# Patient Record
Sex: Male | Born: 1945
Health system: Southern US, Community
[De-identification: ages and names within clinical notes are randomized; demographics above are authoritative.]

## PROBLEM LIST (undated history)

## (undated) DIAGNOSIS — M1990 Unspecified osteoarthritis, unspecified site: Secondary | ICD-10-CM

## (undated) DIAGNOSIS — E669 Obesity, unspecified: Secondary | ICD-10-CM

## (undated) DIAGNOSIS — B54 Unspecified malaria: Secondary | ICD-10-CM

## (undated) DIAGNOSIS — N312 Flaccid neuropathic bladder, not elsewhere classified: Secondary | ICD-10-CM

## (undated) DIAGNOSIS — R531 Weakness: Secondary | ICD-10-CM

## (undated) DIAGNOSIS — G4730 Sleep apnea, unspecified: Secondary | ICD-10-CM

## (undated) DIAGNOSIS — N200 Calculus of kidney: Secondary | ICD-10-CM

## (undated) DIAGNOSIS — R4020 Unspecified coma: Secondary | ICD-10-CM

## (undated) DIAGNOSIS — W19XXXA Unspecified fall, initial encounter: Secondary | ICD-10-CM

## (undated) DIAGNOSIS — I5032 Chronic diastolic (congestive) heart failure: Secondary | ICD-10-CM

## (undated) DIAGNOSIS — R7881 Bacteremia: Secondary | ICD-10-CM

## (undated) DIAGNOSIS — M6282 Rhabdomyolysis: Secondary | ICD-10-CM

## (undated) DIAGNOSIS — N390 Urinary tract infection, site not specified: Secondary | ICD-10-CM

## (undated) DIAGNOSIS — Y92009 Unspecified place in unspecified non-institutional (private) residence as the place of occurrence of the external cause: Secondary | ICD-10-CM

## (undated) DIAGNOSIS — G959 Disease of spinal cord, unspecified: Secondary | ICD-10-CM

## (undated) DIAGNOSIS — F4310 Post-traumatic stress disorder, unspecified: Secondary | ICD-10-CM

## (undated) DIAGNOSIS — Z9359 Other cystostomy status: Secondary | ICD-10-CM

## (undated) DIAGNOSIS — N179 Acute kidney failure, unspecified: Secondary | ICD-10-CM

## (undated) DIAGNOSIS — R29898 Other symptoms and signs involving the musculoskeletal system: Secondary | ICD-10-CM

## (undated) DIAGNOSIS — R51 Headache: Secondary | ICD-10-CM

## (undated) DIAGNOSIS — C61 Malignant neoplasm of prostate: Secondary | ICD-10-CM

## (undated) DIAGNOSIS — R3981 Functional urinary incontinence: Secondary | ICD-10-CM

## (undated) HISTORY — DX: Flaccid neuropathic bladder, not elsewhere classified: N31.2

## (undated) HISTORY — DX: Chronic diastolic (congestive) heart failure: I50.32

## (undated) HISTORY — DX: Weakness: R53.1

## (undated) HISTORY — DX: Malignant neoplasm of prostate: C61

## (undated) HISTORY — PX: SUPRAPUBIC CATHETER INSERTION: SUR719

## (undated) HISTORY — DX: Acute kidney failure, unspecified: N17.9

## (undated) HISTORY — DX: Urinary tract infection, site not specified: N39.0

## (undated) HISTORY — DX: Functional urinary incontinence: R39.81

## (undated) HISTORY — DX: Rhabdomyolysis: M62.82

## (undated) HISTORY — DX: Bacteremia: R78.81

## (undated) HISTORY — DX: Other cystostomy status: Z93.59

## (undated) HISTORY — DX: Other symptoms and signs involving the musculoskeletal system: R29.898

## (undated) HISTORY — DX: Sleep apnea, unspecified: G47.30

## (undated) HISTORY — DX: Calculus of kidney: N20.0

## (undated) HISTORY — DX: Unspecified fall, initial encounter: W19.XXXA

## (undated) HISTORY — DX: Unspecified place in unspecified non-institutional (private) residence as the place of occurrence of the external cause: Y92.009

## (undated) HISTORY — DX: Obesity, unspecified: E66.9

---

## 1966-10-16 DIAGNOSIS — R4020 Unspecified coma: Secondary | ICD-10-CM

## 1966-10-16 HISTORY — DX: Unspecified coma: R40.20

## 1966-10-16 HISTORY — PX: OTHER SURGICAL HISTORY: SHX169

## 2006-10-16 HISTORY — PX: BACK SURGERY: SHX140

## 2007-05-10 ENCOUNTER — Inpatient Hospital Stay (HOSPITAL_COMMUNITY): Admission: EM | Admit: 2007-05-10 | Discharge: 2007-06-10 | Payer: Self-pay | Admitting: Emergency Medicine

## 2007-05-11 ENCOUNTER — Encounter: Admission: RE | Admit: 2007-05-11 | Discharge: 2007-05-11 | Payer: Self-pay | Admitting: Internal Medicine

## 2007-05-11 ENCOUNTER — Ambulatory Visit: Payer: Self-pay | Admitting: Internal Medicine

## 2007-05-13 ENCOUNTER — Encounter (INDEPENDENT_AMBULATORY_CARE_PROVIDER_SITE_OTHER): Payer: Self-pay | Admitting: Internal Medicine

## 2007-05-13 ENCOUNTER — Ambulatory Visit: Payer: Self-pay | Admitting: Vascular Surgery

## 2007-05-13 ENCOUNTER — Encounter: Payer: Self-pay | Admitting: Internal Medicine

## 2007-05-17 HISTORY — PX: ANTERIOR CERVICAL DECOMP/DISCECTOMY FUSION: SHX1161

## 2007-05-17 HISTORY — PX: THORACIC LAMINECTOMY: SHX96

## 2007-05-22 ENCOUNTER — Ambulatory Visit: Payer: Self-pay | Admitting: Vascular Surgery

## 2007-05-22 ENCOUNTER — Ambulatory Visit: Payer: Self-pay | Admitting: Physical Medicine & Rehabilitation

## 2007-07-08 ENCOUNTER — Ambulatory Visit (HOSPITAL_COMMUNITY): Admission: RE | Admit: 2007-07-08 | Discharge: 2007-07-08 | Payer: Self-pay | Admitting: Internal Medicine

## 2007-09-18 ENCOUNTER — Encounter: Admission: RE | Admit: 2007-09-18 | Discharge: 2007-10-16 | Payer: Self-pay | Admitting: Neurosurgery

## 2007-10-18 ENCOUNTER — Encounter: Admission: RE | Admit: 2007-10-18 | Discharge: 2007-11-14 | Payer: Self-pay | Admitting: Neurosurgery

## 2007-11-15 ENCOUNTER — Encounter: Admission: RE | Admit: 2007-11-15 | Discharge: 2007-11-26 | Payer: Self-pay | Admitting: Neurosurgery

## 2011-02-28 NOTE — Discharge Summary (Signed)
NAMECREIG, Jerry [name]               ACCOUNT NO.:  [id number]   MEDICAL RECORD NO.:  [id number]          PATIENT TYPE:  INP   LOCATION:  3030                         FACILITY:  [facility]   PHYSICIAN:  Barnetta Chapel, MDDATE OF BIRTH:  [birth year]   DATE OF ADMISSION:  05/11/2007  DATE OF DISCHARGE:                               DISCHARGE SUMMARY   The preliminary discharge summary is done on June 04, 2007.   The primary care Mariaisabel Bodiford is Dr. Abigail Miyamoto.   PRELIMINARY DISCHARGE DIAGNOSES:  1. Diabetes mellitus.  2. Morbid obesity.  3. Left great toe infection, resolved.  4. Bilateral lower extremity weakness.  5. Status post falls.  6. Herniated nucleus pulposus, spondylosis, cord compression,      myelopathy of C3-4, 4-5 and 5-6.  7. Thoracic spondylosis, stenosis, cord compression, multiple levels.   CONSULTATIONS:  Done include neurosurgery consult. The patient was seen  by Dr. Melvyn Novas. The neck surgery was eventually done by Dr.  Clydene Fake.   PROCEDURES DONE:  1. Anterior cervical decompression, diskectomy and fusion at C3-4, 4-5      and 5-6 with LifeNet allograft bone, __________  anterior cervical      plate.  2. C5-C11 decompressive laminectomy.   IMAGING STUDIES DONE INCLUDE:  1. Doppler venous ultrasound of lower extremities for limb pain. The      venous Doppler ultrasound revealed deep vein thrombosis on      posterior tibial vein, mid calf.  2. Chest x-ray done on May 12, 2007 reveals elevation of right      diaphragm.  3. X-ray of the left foot reveals fracture or degenerative bone      fragments at the lateral aspect of the base of the second proximal      phalanx.  4. CT angio of the chest was negative for pulmonary embolus. However,      it revealed right lower lobe pleural atelectasis.  5. CT scan of the thoracolumbar spine. CT scan of the thorax reveals      multilevel disk degeneration and spondylosis. Spinal stenosis was      noted most  prominently at the T9-10 on the left side due to disk      protrusion.  6. CT scan of the lumbar spine revealed significant lumbar      degenerative changes throughout the lumbar spine, most severe at L4-      5. It also showed moderate to severe spinal stenosis due to      vertebral __________  and advanced facet disease.  7. MRI of the cervical spine revealed multilevel degenerative changes,      large disk protrusion central and left sided at C3-4 with severe      spinal stenosis and compression of the cord; this degeneration and      spondylosis at C4-5, C5-6 and C6-7 with biforaminal narrowing and      mild spinal stenosis.  8. MRI of the thoracic spine revealed relatively small spinal canal in      the thoracic spine, extensive disk degeneration and spondylosis,  left-sided disk protrusion at T9-10 with some compression of the      left side of the cord.  9. MRI of the lumbar spine revealed congenital lumbar stenosis,      acquired disk disease which was present throughout the lumbar      spine, most severe at L4-5 with associated spinal stenosis. There      was also grade 1 slip of L4 on L5 and advanced facet arthropathy.   BRIEF HISTORY AND HOSPITAL COURSE:  Please refer to the H&P done on May 10, 2007. The patient is a 65 year old man with past medical history  significant for morbid obesity, diabetes mellitus, chronic neck and back  pain, bilateral lower extremity weakness and history of  brucellosis  while in [place]. The patient presented with generalized lower leg  weakness and frequent falls. On admission, he also had an infection in  the left great toe. The patient is known to have cervical and thoracic  spine problem and was advised to have surgery about four years ago.   The patient was admitted to the regular medical floor. Neurosurgical  consult was called. The patient was seen by Dr. Melvyn Novas. It was  advised that the infection of the great toe should  be controlled first,  and then patient should be worked up for surgery. Wound swab of the left  great toe infection grew Morganella morgagni which was sensitive to  Cipro. The patient was managed with IV Cipro. Wound care consult was  also called. The left great toe infection has healed. MRI, CT scan and x-  rays of the cervical, thoracic and lumbar spine were done (please see  above results). The patient eventually had decompressive surgery and  diskectomy on May 21, 2007. Post-procedure, the patient was admitted  to the neuro ICU. The patient developed dysphagia and had to be fed via  panda tube. The patient is still getting tube feeds via the panda tube  at the time of this dictation. He is going to have another speech  evaluation done today. If the patient passes speech evaluation today,  ADA diet will be commenced, and the plan is for the patient to be  discharged back to skilled nursing facility. PT/OT has followed the  patient throughout his stay in the hospital. Official rehabilitation  consult was called. The patient was felt not to be a rehabilitation  candidate for now. The plan is to eventually discharge the patient to  skilled nursing facility.   Discharge medications, discharge plans and any other significant finding  during this hospitalization will be dictated by the discharging  physician.      Barnetta Chapel, MD  Electronically Signed     SIO/MEDQ  D:  06/04/2007  T:  06/04/2007  Job:  [id number]   cc:   Chales Salmon. Abigail Miyamoto, M.D.  Clydene Fake, M.D.

## 2011-02-28 NOTE — Discharge Summary (Signed)
Jerry [name], Jerry [name]               ACCOUNT NO.:  [id number]   MEDICAL RECORD NO.:  [id number]            PATIENT TYPE:   LOCATION:                                 FACILITY:   PHYSICIAN:  Michelene Gardener, MD         DATE OF BIRTH:   DATE OF ADMISSION:  DATE OF DISCHARGE:                               DISCHARGE SUMMARY   ADDENDUM   PRIMARY CARE PHYSICIAN:  Dr. Henrine Screws.   DISCHARGE DIAGNOSES:  1. Herniated nucleus pulposus with spondylosis, cord compression and      myelopathy of C3-4, C5, and C6.  2. Thoracic spondylosis with spinal stenosis and cord compression.  3. Status post laminectomy.  4. Diabetes mellitus.  5. Morbid obesity.  6. Left great toe infection.  7. Bilateral lower extremity weakness.  8. Falls.   DISCHARGE MEDICATIONS:  1. Aspirin 81 mg p.o. once daily.  2. Colace 100 mg p.o. twice daily as needed.  3. Multivitamin 1 tab p.o. once daily.  4. Ambien 10 mg p.o. at bedtime as needed.  5. Symbicort 1 tab p.o. at bedtime as needed.  6. Vicodin 1-2 tabs p.o. q.4 hours as needed.   HOSPITAL COURSE:  The course of hospitalization up to August 19 was  dictated before.  Following that time, the patient has been very stable.  At that time initially, the patient was NPO because of her dysphagia.  On August 20, the patient underwent a swallow evaluation and had  modified swallow study.  He did fine.  The patient was started on  dysphagia diet and honey-thick diet.  He has been improving since that  time.  I recommend him to continue on honey-thick liquids by teaspoon,  and to be on  dysphagia 2 diet.  This can be reevaluated, and his diet  can be advanced as tolerated.  The patient has been also followed by  physical therapy, and he improved a lot.  He will be  going home on the above-mentioned medications, and to continue physical  therapy.  Otherwise, other medical problems remain stable.   Total assessment time is 40 minutes.      Michelene Gardener, MD  Electronically Signed     NAE/MEDQ  D:  06/10/2007  T:  06/10/2007  Job:  [id number]   cc:   Chales Salmon. Abigail Miyamoto, M.D.

## 2011-02-28 NOTE — Op Note (Signed)
NAMEDOAK, MAH               ACCOUNT NO.:  [id number]   MEDICAL RECORD NO.:  [id number]          PATIENT TYPE:  INP   LOCATION:  3109                         FACILITY:  [facility]   PHYSICIAN:  Clydene Fake, M.D.  DATE OF BIRTH:  1946-06-18   DATE OF PROCEDURE:  05/21/2007  DATE OF DISCHARGE:                               OPERATIVE REPORT   DIAGNOSIS:  1. Herniated nucleus pulposus, spondylosis, cord compression,      myelopathy C3-4, 4-5 and 5-6.  2. Thoracic spondylosis, stenosis, cord compression multiple levels.   PROCEDURE:  1. Anterior cervical decompression, diskectomy and fusion at C3-4, 4-5      and 5-6 with Lifenet allograft bone, Trestle anterior cervical      plate.  2. Separate procedures same anesthesia, T5-T11 decompressive      laminectomy.   SURGEON:  Clydene Fake, M.D.   Assistant for the anterior cervical fusion was Dr. Franky Macho, the thoracic  laminectomy Dr. Jeral Fruit.   ANESTHESIA:  General endotracheal tube anesthesia.   BLOOD LOSS:  1800 mL.  Blood given 3 units packed red cells.   DRAINS:  None.   COMPLICATIONS:  None.   REASON FOR PROCEDURE:  The patient is a 65 year old gentleman who has  had progressive, myelopathy found to have severe cervical stenosis, cord  compression and thoracic problems even has some lumbar issues.  He has  DVT with leg swelling, infection, infected foot  as his medical issues.  He was stabilized.  The patient was brought in for decompression of both  cervical and thoracic spine in two procedures under the same anesthesia.   PROCEDURE IN DETAIL:  The patient was brought to operating room, general  anesthesia was induced and the patient was prepped, draped in sterile  fashion and the patient was placed in 10 pounds halter traction and then  neck again prepped and draped in sterile fashion.  Site of incision in  the neck was injected with 10 mL of 1% lidocaine with epinephrine.  Incision was then made from the midline  to the anterior border of the  sternocleidomastoid muscle on the left side of neck, incision taken down  to the platysma and hemostasis obtained Bovie cauterization.  The  platysma was incised and blunt dissection taken to the anterior cervical  fascia, the anterior cervical spine and markers placed in two  interspaces 3-4 and 4-5 spaces.  X-rays obtained confirming our  positioning at 3-4 and almost could see that the needle at 4-5.  Disk  spaces were incised with 15 blade partial diskectomy performed as the  needles removed and the longus coli muscles were reflected laterally  using the Bovie from C3-C6.  Self-retaining retractor were placed so we  could see the 3-4 and 4-5 interspaces.  Distraction pins placed in the  C3 and C5. The interspace was slightly distracted, diskectomy continued  with pituitary rongeurs and curettes, Kerrison punches were used to  continue to remove disk out laterally and remove the anterior  osteophytes.  Microscope was brought in for microdissection.  We used  curettes to continue with diskectomy and  1 and 2-mm Kerrison punches  were used to remove posterior ligament, disk and posterior osteophytes  to decompress the center canal and bilateral foraminotomies performed.  This was first done at 3-4, the tightest level and that at 4-5.  When we  were finished we had good decompression in the central canal and  foramen.  We used high-speed drill to remove cartilaginous endplate at  both levels, measured height of disk space to be 5 mm at the 3-4 level,  6 mm at the 4-5 level and the corresponding Lifenet allograft bones were  tapped in place, countersunk a couple millimeters.  Before that we had  good hemostasis with Gelfoam and thrombin.  This was then irrigated out.  We irrigated with antibiotic solution and did have good hemostasis  before tapping the bone plugs in place.  Distraction pin was removed  from C3 and placed in the C6, retractors were moved down  so we could see  the 5-6 level.  The disk space was incised with 15 blade and diskectomy  was done with pituitary rongeurs and curettes and anterior osteophytes  removed with Kerrison punches.  We distracted the interspace and  diskectomy continued using the microscope for microdissection, curettes  and pituitary rongeurs and then 1 and 2 mm Kerrison punches were used to  remove posterior disk osteophytes and ligament to decompress the central  canal.  Bilateral foraminotomies were also performed.  When we were  finished, we had good decompression.  Height of the disk space was  measured to be 6 mm and 6 mm Lifenet allograft bone was tapped into  place, countersunk a few millimeters.  Distraction pins removed.  Hemostasis obtained with Gelfoam and thrombin at each of these holes  from the distraction pins.  Weight was removed from the traction.  We  had good position of the bone plugs at 3-4, 4-5, 5-6.  Trestle anterior  cervical plate was placed over the anterior cervical spine, two screws  placed at C3, two screws in C4 and C5 and the C6.  These were tightened  down.  Lateral x-rays obtained showing good position plates, screws and  bone plugs at 3-4, 4-5 level, could not see the 5-6 due to body habitus.  Intraop looked good, we irrigated antibiotic solution.  We had good  hemostasis.  Retractors removed.  We still had good hemostasis and the  platysma was closed with 3-0 Vicryl interrupted sutures.  Subcutaneous  tissue closed with same.  Skin closed benzoin, Steri-Strips.  Dressing  was placed.  The patient was placed in a soft cervical collar.  The  patient was placed back into stretcher and then placed into a prone  position on the operating room table for the second part of the case  with the thoracic laminectomy.  The patient had all pressure points  padded and prepped and draped in a sterile fashion.  Incision was then  made over the thoracic spine.  Incision taken down to the  fascia.  Hemostasis obtained Bovie cauterization.  The fascia was incised with  Bovie and subperiosteal dissection was done over the spinous process and  lamina bilaterally and retractors were placed.  We placed markers in  interspaces and took an AP x-ray to show our positioning so we dissected  from T5 through the top of T10 so we needed to extend our incision  caudally a bit which we did and continued dissection down subperiosteal  dissection over the spinous processes and lamina so that  most of T11 was  exposed.  We placed markers in the interspace and took second x-ray and  did confirm our positioning.  Laminectomy was then performed and started  with the [place] rongeurs, removing the bottom of the T5 spinous process  and lamina of all the intervening levels all the way to the top of the  T11 spinous process lamina.  Again used [employer] and then  Kerrison punches to do the decompressive laminectomy, high-speed drill  was also used to assist with the laminectomy.  When we were finished we  had good central decompression posteriorly from bottom of C5 to top of  C11 inclusive.  We used Kerrison high-speed drill to smooth out our  lateral margins and used Kerrison punches to extend our decompression a  little bit laterally so we would have good posterior decompression.  Hemostasis obtained with Gelfoam and thrombin in the epidural space.  This was then irrigated out.  There is significant paraspinous muscle  oozing during the exposure of the lamina.  This was controlled with  bipolar cauterization Bovie and Gelfoam and thrombin.  When we were  finished, we irrigated with antibiotic solution.  We did have good  hemostasis.  Retractors were removed and the fascia closed with 0-0  Vicryl interrupted suture.  Subcutaneous tissue closed with and 2-0  Vicryl interrupted sutures.  Skin closed with staples.  Dressing was  placed.  The patient was placed back in the supine position.  We  again  examined his neck, there was no hematoma, incision looked good.  He was  awoken from anesthesia, extubated and transferred recovery room in  stable but fair condition.           ______________________________  Clydene Fake, M.D.    JRH/MEDQ  D:  05/21/2007  T:  05/22/2007  Job:  [id number]

## 2011-02-28 NOTE — H&P (Signed)
[name], [name]               ACCOUNT NO.:  [id number]   MEDICAL RECORD NO.:  [id number]          PATIENT TYPE:  INP   LOCATION:  1605                         FACILITY:  [facility]   PHYSICIAN:  Jerry [name], MDDATE OF BIRTH:  [birth year]   DATE OF ADMISSION:  05/10/2007  DATE OF DISCHARGE:  05/11/2007                              HISTORY & PHYSICAL   CHIEF COMPLAINT:  Lower extremity weakness and frequent falls.   HISTORY OF PRESENT ILLNESS:  The patient is a 65 year old gentleman with  a long history of lower extremity weakness.  He states that in 2004 he  underwent extensive neurology evaluation and was referred to Dr. Coletta [name] for neurosurgical treatment.  He states that emergent surgery  was recommended at that time, apparently lumbar decompression surgery.  He was ambulatory at that time and declined surgical intervention.  He  is unclear of the details of his medical history.  He has a history of  frequent falls and states that he has fallen down stairs on 4 occasions.  He uses a cane and his 2-story condominium is equipped with rales and he  also uses crutches.  He states at 11:00 p.m. last night he fell and was  unable to get up and ambulate and EMS was called at 5:38 a.m.  They  assisted him back to bed.  He again fell trying to ambulate to the  bathroom and had urinary incontinence.  Because of his progressive  inability to ambulate, he presented to the emergency department via EMS  for evaluation.   He states that he is alternating diarrhea and constipation, but has  noted no real changes in his bowel habits.  He denies any incontinence  except when he falls and is unable to get to the restroom.  He denies  any significant back pain.  He states that he has motor weakness but no  sensory loss.  The patient is now admitted for further evaluation and  treatment of his lower extremity weakness.   PAST MEDICAL HISTORY:  He states he was last hospitalized at  [facility] in May of 1990 for trauma and had a brief overnight admission.  He has not seen his primary care Jerry [name] in approximately 2 years.  He  takes no chronic medications he, although he does take multiple  vitamins.  As mentioned, he has been evaluated by Neurology and  Neurosurgery for his lower extremity weakness.  He is a [place] veteran  and he had apparently multiple shrapnel and some head trauma in the  service.  He states that while on active duty, he had malaria and  brucellosis.  His years in the service complicated by post traumatic  stress disorder.   SOCIAL HISTORY:  He is divorced.  Lives in a two-story condominium.  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  He has 1 son who lives in  [place].  He is a nondrinker, nonsmoker.  Presently, he is active as a  day trader.  REVIEW OF SYSTEM:  Otherwise noncontributory.  He states that he has  seen Dr. Amil [name] as well as Dr. Elsie [name] in the past and has had periodic  cardiology evaluations since age 44.  His last stress test was in 2004.  This was done in part due to his dizziness, frequent falls, and rule out  tachyarrhythmias.  Holter or event monitoring was also done at that  time.   FAMILY HISTORY:  Father died at 72.  He was status post CABG apparently  at age 65.  He also had senile dementia of the Alzheimer's type.  Mother  died at 37 with a history of coronary disease and also dementia.  One  sister as well.   PHYSICAL EXAMINATION:  GENERAL:  A morbidly obese male who is alert,  cooperative, and in no acute distress  VITAL SIGNS:  Temperature of 97.9, pulse rate 81, respiratory rate 16,  O2 saturation 95%.  SKIN:  Warm and dry without rash.  HEENT:  No signs of trauma.  Pupil responses were normal.  Extraocular  muscles were full.  ENT negative.  NECK:  No bruits.  CHEST:  Clear anterolaterally.  CARDIOVASCULAR:  Normal  S1-S2.  No murmurs.  ABDOMEN:  Markedly obese.  There is a suggestion of hepatomegaly but not  definite.  There is no tenderness.  Bowel sounds normal.  EXTERNAL GENITALIA:  Unremarkable.  RECTAL EXAM:  Done with considerable difficulty due to his size.  He did  have lax rectal tone.  EXTREMITIES:  Bilateral lower extremity edema, the right greater than  the left.  He had some small excoriations over the dorsal aspect of the  toes.  Dorsalis pedis pulses were full.  Posterior tibial pulses were  not easily palpable.  NEUROLOGIC EXAM:  Normal cranial nerve examination.  Power and strength  was symmetrical and fairly normal.  He had bilateral lower extremity  weakness.  He is unable to raise either leg off examining table.  He was  able to flex at the hip slightly.  Reflexes were diminished.  Plantar  responses were withdrawal.   IMPRESSION:  Lower extremity weakness, chronic with acute exacerbation.   DISPOSITION:  We will admit the patient to the hospital.  We will obtain  a lumbar MRI and further evaluated by Neurology and possible  Neurosurgery.      Jerry Savers, MD  Electronically Signed     PFK/MEDQ  D:  05/10/2007  T:  05/12/2007  Job:  [id number]

## 2011-02-28 NOTE — Consult Note (Signed)
Jerry [name], Jerry [name] NO.:  [id number]   MEDICAL RECORD NO.:  [id number]          PATIENT TYPE:  INP   LOCATION:  3027                         FACILITY:  [facility]   PHYSICIAN:  Melvyn Novas, M.D.  DATE OF BIRTH:  1946-06-19   DATE OF CONSULTATION:  DATE OF DISCHARGE:                                 CONSULTATION   This patient is seen at [facility] on Saturday, the 26th of  July in a consult requested by Dr. Amador Cunas.  The patient was admitted to the hospitalist service through Dr.  Amador Cunas but transferred to the [facility]  He was  born on 26th of June 03, 1946 and is with a morbidly obese Caucasian,  single male, living in the [place] area.   CHIEF COMPLAINT:  At the time of admission, is generalized weakness and  frequent falls.  By now, the patient is not able to lift his lower  extremities, and he states that he has been falling at least 5 or 6  times within the last week.  Twice, the EMS had to come to his house.   HISTORY OF PRESENT ILLNESS:  The patient states that he has increasing  weakness but has been evaluated for weakness at [facility] in the past, about 4 years ago upon request of Dr. Abigail Miyamoto,  his primary care doctor.  He also remembers that he was referred to Dr.  Coletta Memos for a surgical opinion and was told that he needs cervical  and thoracic spinal surgery, to prevent ambulatory dysfunction.  At the  time, the patient did not choose to follow up with the surgical plan and  did not follow up with neurology either.   The patient is now unable to ambulate within his own house.  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  He is 71 years of old.  He does not know of any past medical history, denies hypertension or  diabetes and states he takes no medications, except occasionally  something for acid reflux over the counter and occasional baby aspirin.  He has also  spent a significant time on the floor in his house after the  fall, not being able to reach the phone to call the ambulance or a  neighbor that has frequently helped him.  He states that, when he fell,  he had urinary incontinence, was unable to ambulate at all to lift  himself up, even with leaning on furniture and that he was therefore  presenting at 11:00 p.m. on Friday to the emergency room.  He also had  recently some problems with diarrhea alternating with constipation.  There was no bowel incontinence.  He also does not have back pain.  There is no sensory loss but only lower motor extremity weakness.   PAST MEDICAL HISTORY:  Last hospitalization was in 1990 for trauma, and  it took place at [facility].  He has  not followed up with Dr. Abigail Miyamoto in approximately 2 years.   MEDICATIONS:  He takes no chronic medications.  ALLERGIES:  HE HAS NO KNOWN ALLERGIES.   SOCIAL HISTORY:  As to his social history, he is a [place] veteran,  single, nonsmoker, nondrinker and has been treated for head trauma.  During his service time, he was also contracting malaria and brucellosis  in [place], and he has been diagnosed with post-traumatic stress  disorder.  He has a son from a previous marriage.  Apparently, he is  divorced and has stated that he has no live-in companion of any kind.   REVIEW OF SYSTEMS:  Is otherwise noncontributory.  The patient had again  been seen by neurology and neurosurgery approximately 2004, 2005.   FAMILY HISTORY:  Father died of a sudden cardiac arrest, and apparently  had some type of dementia.  Mother died of coronary disease and also  dementia, but was only in her early 16s.   PHYSICAL EXAMINATION:  GENERAL:  This is a morbidly obese Caucasian  male, alert, cooperative, talkative, in no acute distress but very  anxious.  VITAL SIGNS:  Stable.  Temperature was 98 degrees Fahrenheit, pulse was  81 beats per minute,  respiratory rate 16 per minute, O2 saturation on  room air was 95%.  SKIN:  Warm and dry without a rash.  EXTREMITIES:  The patient has, however, decreased popliteal and dorsal  pedis pulses and had a bluish big toe on the left foot that appears to  me possibly gangrenous.  There are also multiple blisters all over his  feet that he states came from carpet burn when he tries to walk to the  phone.  HEENT:  Atraumatic normocephalic head.  Pupils responses were normal.  Extraocular muscles and rotation were full.  There was a Mallampati  grade 3.  The patient has no sensory loss and no facial droop.  There is  no carotid bruit.  LUNGS:  Clear to auscultation.  CARDIAC:  He can be barely sit up with assistance, to allow me to  auscultate him, by anterior auscultation no lung rhonci, no rales,  murmur.  ABDOMEN:  The patient is morbidly obese.  Bowel sounds were normal..   He has a edema bilaterally.  Again, there is the with left-sided  possible gangrenous big toe. Left foot dorsalis pedis pulses were not  felt.  NEUROLOGIC:  Mental status:  The patient has a peculiar affect.  He is  friendly, cooperative, significantly anxious and repetitively asking  questions that he re-phrases.  I am sure there is no memory problems but  a problem of attention, and he appears to be pleasant and often precedes  with repetitive questions with a sentence, I know I have asked this  before, but I need to ask you again.  Again, cranial nerve examination  as above was normal.  Motor examination:  The patient cannot lift either leg anti-gravity.   He does have movement but can not  not maintain antigravity strenth.  He  can move his arms anti-gravity.  He seems to have a slight possible  pronator drift on the left. He had left-sided left foot strength, than  on the right, but no finger-nose dysmetria, ataxia.   Deep tendon reflexes in the lower extremities were but diminished, and  at the Achilles tendon  level not obtainable at all.  Plantar responses  were normal, but I needed to apply strong repetitive Babinski  stimulation to obtain them.   A gait examination was deferred.      Melvyn Novas, M.D.  Electronically Signed  CD/MEDQ  D:  05/13/2007  T:  05/13/2007  Job:  [id number]   cc:   Chales Salmon. Abigail Miyamoto, M.D.  Fax: [phone]   Coletta Memos, M.D.  Fax: [phone]   Clydene Fake, M.D.  Fax: [phone]

## 2011-07-28 LAB — CBC
HCT: 34.8 — ABNORMAL LOW
Hemoglobin: 11.6 — ABNORMAL LOW
MCV: 89.6
RDW: 14.4 — ABNORMAL HIGH
WBC: 7.8

## 2011-07-31 LAB — BASIC METABOLIC PANEL
BUN: 10
BUN: 5 — ABNORMAL LOW
BUN: 6
BUN: 8
CO2: 30
CO2: 32
CO2: 34 — ABNORMAL HIGH
CO2: 26
CO2: 26
Calcium: 8.3 — ABNORMAL LOW
Calcium: 8.4
Calcium: 8.6
Calcium: 8.7
Calcium: 9
Calcium: 9
Calcium: 9.6
Chloride: 102
Chloride: 104
Chloride: 95 — ABNORMAL LOW
Chloride: 97
Creatinine, Ser: 0.63
Creatinine, Ser: 0.7
Creatinine, Ser: 0.77
Creatinine, Ser: 0.8
Creatinine, Ser: 0.93
Creatinine, Ser: 1.76 — ABNORMAL HIGH
GFR calc Af Amer: >60
GFR calc Af Amer: >60
GFR calc Af Amer: >60
GFR calc Af Amer: >60
GFR calc Af Amer: >60
GFR calc Af Amer: >60
GFR calc Af Amer: 48 — ABNORMAL LOW
GFR calc Af Amer: >60
GFR calc non Af Amer: >60
GFR calc non Af Amer: >60
GFR calc non Af Amer: >60
GFR calc non Af Amer: >60
GFR calc non Af Amer: >60
GFR calc non Af Amer: 39 — ABNORMAL LOW
GFR calc non Af Amer: >60
Glucose, Bld: 100 — ABNORMAL HIGH
Glucose, Bld: 107 — ABNORMAL HIGH
Glucose, Bld: 109 — ABNORMAL HIGH
Glucose, Bld: 112 — ABNORMAL HIGH
Glucose, Bld: 94
Potassium: 3.5
Potassium: 3.7
Potassium: 4.1
Potassium: 4.1
Sodium: 136
Sodium: 138
Sodium: 140
Sodium: 142
Sodium: 139
Sodium: 139

## 2011-07-31 LAB — CBC
HCT: 30 — ABNORMAL LOW
HCT: 31.2 — ABNORMAL LOW
HCT: 31.5 — ABNORMAL LOW
HCT: 32.3 — ABNORMAL LOW
HCT: 33 — ABNORMAL LOW
HCT: 36.4 — ABNORMAL LOW
HCT: 36.7 — ABNORMAL LOW
HCT: 38.2 — ABNORMAL LOW
HCT: 40.7
HCT: 42.1
Hemoglobin: 10.2 — ABNORMAL LOW
Hemoglobin: 10.6 — ABNORMAL LOW
Hemoglobin: 10.8 — ABNORMAL LOW
Hemoglobin: 11.2 — ABNORMAL LOW
Hemoglobin: 12.9 — ABNORMAL LOW
Hemoglobin: 13
Hemoglobin: 13.6
Hemoglobin: 13.9
Hemoglobin: 13.9
MCHC: 33.7
MCHC: 33.8
MCHC: 33.9
MCHC: 33.9
MCHC: 33.9
MCHC: 34.2
MCHC: 34.3
MCHC: 34.3
MCHC: 33.4
MCHC: 33.6
MCHC: 33.8
MCHC: 33.9
MCHC: 34.7
MCV: 89.3
MCV: 89.4
MCV: 89.8
MCV: 90.3
MCV: 90.5
MCV: 90.6
MCV: 91
MCV: 89.1
MCV: 90.1
MCV: 91
MCV: 92
Platelets: 144 — ABNORMAL LOW
Platelets: 164
Platelets: 188
Platelets: 201
Platelets: 208
Platelets: 252
Platelets: 282
Platelets: 162
Platelets: 192
Platelets: 206
Platelets: 208
Platelets: 212
RBC: 3.51 — ABNORMAL LOW
RBC: 3.69 — ABNORMAL LOW
RBC: 4.03 — ABNORMAL LOW
RBC: 4.16 — ABNORMAL LOW
RBC: 4.5
RBC: 4.53
RDW: 13.6
RDW: 13.6
RDW: 13.8
RDW: 13.9
RDW: 13.9
RDW: 14
RDW: 14
RDW: 14.1 — ABNORMAL HIGH
RDW: 14.2 — ABNORMAL HIGH
RDW: 14.6 — ABNORMAL HIGH
RDW: 14.1 — ABNORMAL HIGH
RDW: 14.1 — ABNORMAL HIGH
RDW: 14.4 — ABNORMAL HIGH
WBC: 10.2
WBC: 10.6 — ABNORMAL HIGH
WBC: 10.7 — ABNORMAL HIGH
WBC: 8
WBC: 8.3
WBC: 9.2
WBC: 9.6
WBC: 12.6 — ABNORMAL HIGH
WBC: 14.4 — ABNORMAL HIGH
WBC: 6.4

## 2011-07-31 LAB — ABO/RH: ABO/RH(D): A NEG

## 2011-07-31 LAB — CULTURE, BLOOD (ROUTINE X 2): Culture: NO GROWTH

## 2011-07-31 LAB — POCT I-STAT 7, (LYTES, BLD GAS, ICA,H+H)
Calcium, Ion: 1.13
Hemoglobin: 9.9 — ABNORMAL LOW
O2 Saturation: 96
Patient temperature: 35.7
TCO2: 28
pCO2 arterial: 37
pO2, Arterial: 73 — ABNORMAL LOW

## 2011-07-31 LAB — PROTIME-INR
INR: 1.1
Prothrombin Time: 14.2

## 2011-07-31 LAB — URINALYSIS, ROUTINE W REFLEX MICROSCOPIC
Glucose, UA: NEGATIVE
Nitrite: NEGATIVE
Nitrite: NEGATIVE
Specific Gravity, Urine: 1.02
Specific Gravity, Urine: 1.021
Urobilinogen, UA: 0.2
pH: 5.5

## 2011-07-31 LAB — URINE MICROSCOPIC-ADD ON

## 2011-07-31 LAB — COMPREHENSIVE METABOLIC PANEL
Albumin: 3.2 — ABNORMAL LOW
BUN: 21
Creatinine, Ser: 1.26
Total Bilirubin: 1.1
Total Protein: 7

## 2011-07-31 LAB — DIFFERENTIAL
Basophils Relative: 0
Eosinophils Relative: 0
Lymphocytes Relative: 15
Lymphocytes Relative: 7 — ABNORMAL LOW
Lymphs Abs: 0.9
Monocytes Relative: 11
Neutro Abs: 6.8
Neutrophils Relative %: 71
Neutrophils Relative %: 83 — ABNORMAL HIGH

## 2011-07-31 LAB — POCT I-STAT 4, (NA,K, GLUC, HGB,HCT)
Hemoglobin: 11.2 — ABNORMAL LOW
Operator id: 117071
Potassium: 4
Sodium: 138

## 2011-07-31 LAB — HEMOGLOBIN A1C: Hgb A1c MFr Bld: 6.3 — ABNORMAL HIGH

## 2011-07-31 LAB — CROSSMATCH: Antibody Screen: NEGATIVE

## 2011-07-31 LAB — WOUND CULTURE

## 2011-07-31 LAB — SEDIMENTATION RATE
Sed Rate: 28 — ABNORMAL HIGH
Sed Rate: 28 — ABNORMAL HIGH

## 2011-07-31 LAB — APTT: aPTT: 28

## 2011-07-31 LAB — CK: Total CK: 2666 — ABNORMAL HIGH

## 2011-07-31 LAB — TSH: TSH: 3.281

## 2011-07-31 LAB — CK TOTAL AND CKMB (NOT AT ARMC)
CK, MB: 7.5 — ABNORMAL HIGH
Relative Index: 0.4

## 2011-07-31 LAB — HEPARIN LEVEL (UNFRACTIONATED): Heparin Unfractionated: 0.34

## 2011-10-26 DIAGNOSIS — Z23 Encounter for immunization: Secondary | ICD-10-CM | POA: Diagnosis not present

## 2012-03-16 HISTORY — PX: PROSTATE SURGERY: SHX751

## 2012-10-25 ENCOUNTER — Inpatient Hospital Stay (HOSPITAL_COMMUNITY): Payer: Medicare Other

## 2012-10-25 ENCOUNTER — Emergency Department (HOSPITAL_COMMUNITY): Payer: Medicare Other

## 2012-10-25 ENCOUNTER — Inpatient Hospital Stay (HOSPITAL_COMMUNITY)
Admission: EM | Admit: 2012-10-25 | Discharge: 2012-11-01 | DRG: 690 | Disposition: A | Discharge status: Rehabilitation Facility | Payer: Medicare Other | Attending: Internal Medicine | Admitting: Internal Medicine

## 2012-10-25 ENCOUNTER — Encounter (HOSPITAL_COMMUNITY): Payer: Self-pay | Admitting: *Deleted

## 2012-10-25 DIAGNOSIS — Z6841 Body Mass Index (BMI) 40.0 and over, adult: Secondary | ICD-10-CM

## 2012-10-25 DIAGNOSIS — E669 Obesity, unspecified: Secondary | ICD-10-CM | POA: Diagnosis present

## 2012-10-25 DIAGNOSIS — W19XXXA Unspecified fall, initial encounter: Secondary | ICD-10-CM

## 2012-10-25 DIAGNOSIS — R5383 Other fatigue: Secondary | ICD-10-CM | POA: Diagnosis not present

## 2012-10-25 DIAGNOSIS — N390 Urinary tract infection, site not specified: Secondary | ICD-10-CM

## 2012-10-25 DIAGNOSIS — Y92009 Unspecified place in unspecified non-institutional (private) residence as the place of occurrence of the external cause: Secondary | ICD-10-CM

## 2012-10-25 DIAGNOSIS — G4730 Sleep apnea, unspecified: Secondary | ICD-10-CM | POA: Diagnosis not present

## 2012-10-25 DIAGNOSIS — R338 Other retention of urine: Secondary | ICD-10-CM | POA: Diagnosis not present

## 2012-10-25 DIAGNOSIS — T45515A Adverse effect of anticoagulants, initial encounter: Secondary | ICD-10-CM | POA: Diagnosis present

## 2012-10-25 DIAGNOSIS — R5381 Other malaise: Secondary | ICD-10-CM | POA: Diagnosis not present

## 2012-10-25 DIAGNOSIS — M7989 Other specified soft tissue disorders: Secondary | ICD-10-CM | POA: Diagnosis not present

## 2012-10-25 DIAGNOSIS — N39490 Overflow incontinence: Secondary | ICD-10-CM | POA: Diagnosis not present

## 2012-10-25 DIAGNOSIS — R3981 Functional urinary incontinence: Secondary | ICD-10-CM

## 2012-10-25 DIAGNOSIS — Z5189 Encounter for other specified aftercare: Secondary | ICD-10-CM | POA: Diagnosis not present

## 2012-10-25 DIAGNOSIS — R404 Transient alteration of awareness: Secondary | ICD-10-CM | POA: Diagnosis not present

## 2012-10-25 DIAGNOSIS — B964 Proteus (mirabilis) (morganii) as the cause of diseases classified elsewhere: Secondary | ICD-10-CM | POA: Diagnosis present

## 2012-10-25 DIAGNOSIS — R509 Fever, unspecified: Secondary | ICD-10-CM | POA: Diagnosis not present

## 2012-10-25 DIAGNOSIS — D72829 Elevated white blood cell count, unspecified: Secondary | ICD-10-CM | POA: Diagnosis present

## 2012-10-25 DIAGNOSIS — R29898 Other symptoms and signs involving the musculoskeletal system: Secondary | ICD-10-CM | POA: Diagnosis present

## 2012-10-25 DIAGNOSIS — D6959 Other secondary thrombocytopenia: Secondary | ICD-10-CM | POA: Diagnosis present

## 2012-10-25 DIAGNOSIS — Z9119 Patient's noncompliance with other medical treatment and regimen: Secondary | ICD-10-CM | POA: Diagnosis not present

## 2012-10-25 DIAGNOSIS — S0990XA Unspecified injury of head, initial encounter: Secondary | ICD-10-CM | POA: Diagnosis not present

## 2012-10-25 DIAGNOSIS — A4189 Other specified sepsis: Secondary | ICD-10-CM | POA: Diagnosis not present

## 2012-10-25 DIAGNOSIS — F411 Generalized anxiety disorder: Secondary | ICD-10-CM | POA: Diagnosis not present

## 2012-10-25 DIAGNOSIS — I369 Nonrheumatic tricuspid valve disorder, unspecified: Secondary | ICD-10-CM | POA: Diagnosis not present

## 2012-10-25 DIAGNOSIS — R296 Repeated falls: Secondary | ICD-10-CM | POA: Diagnosis present

## 2012-10-25 DIAGNOSIS — Z91199 Patient's noncompliance with other medical treatment and regimen due to unspecified reason: Secondary | ICD-10-CM

## 2012-10-25 HISTORY — DX: Obesity, unspecified: E66.9

## 2012-10-25 HISTORY — DX: Sleep apnea, unspecified: G47.30

## 2012-10-25 HISTORY — DX: Other symptoms and signs involving the musculoskeletal system: R29.898

## 2012-10-25 HISTORY — DX: Disease of spinal cord, unspecified: G95.9

## 2012-10-25 HISTORY — DX: Unspecified fall, initial encounter: W19.XXXA

## 2012-10-25 HISTORY — DX: Functional urinary incontinence: R39.81

## 2012-10-25 HISTORY — DX: Urinary tract infection, site not specified: N39.0

## 2012-10-25 HISTORY — DX: Unspecified malaria: B54

## 2012-10-25 HISTORY — DX: Unspecified fall, initial encounter: Y92.009

## 2012-10-25 LAB — POCT I-STAT, CHEM 8
BUN: 27 mg/dL — ABNORMAL HIGH (ref 6–23)
Calcium, Ion: 1.08 mmol/L — ABNORMAL LOW (ref 1.13–1.30)
Chloride: 102 mEq/L (ref 96–112)
Glucose, Bld: 147 mg/dL — ABNORMAL HIGH (ref 70–99)

## 2012-10-25 LAB — CBC
HCT: 42.9 % (ref 39.0–52.0)
Hemoglobin: 13 g/dL (ref 13.0–17.0)
MCH: 31.6 pg (ref 26.0–34.0)
MCH: 30.9 pg (ref 26.0–34.0)
MCHC: 33.1 g/dL (ref 30.0–36.0)
MCV: 93.5 fL (ref 78.0–100.0)
MCV: 93.3 fL (ref 78.0–100.0)
Platelets: 154 10*3/uL (ref 150–400)
Platelets: 134 10*3/uL — ABNORMAL LOW (ref 150–400)
RBC: 4.59 MIL/uL (ref 4.22–5.81)
RBC: 4.21 MIL/uL — ABNORMAL LOW (ref 4.22–5.81)

## 2012-10-25 LAB — COMPREHENSIVE METABOLIC PANEL
Albumin: 3 g/dL — ABNORMAL LOW (ref 3.5–5.2)
Alkaline Phosphatase: 62 U/L (ref 39–117)
BUN: 26 mg/dL — ABNORMAL HIGH (ref 6–23)
Chloride: 94 mEq/L — ABNORMAL LOW (ref 96–112)
Creatinine, Ser: 1.44 mg/dL — ABNORMAL HIGH (ref 0.50–1.35)
GFR calc Af Amer: 56 mL/min — ABNORMAL LOW (ref >90)
GFR calc non Af Amer: 48 mL/min — ABNORMAL LOW (ref >90)
Glucose, Bld: 143 mg/dL — ABNORMAL HIGH (ref 70–99)
Total Bilirubin: 0.9 mg/dL (ref 0.3–1.2)

## 2012-10-25 LAB — URINE MICROSCOPIC-ADD ON

## 2012-10-25 LAB — URINALYSIS, ROUTINE W REFLEX MICROSCOPIC
Bilirubin Urine: NEGATIVE
Glucose, UA: NEGATIVE mg/dL
Ketones, ur: 15 mg/dL — AB
pH: 8.5 — ABNORMAL HIGH (ref 5.0–8.0)

## 2012-10-25 LAB — TSH: TSH: 2.323 u[IU]/mL (ref 0.350–4.500)

## 2012-10-25 LAB — CREATININE, SERUM: Creatinine, Ser: 1.32 mg/dL (ref 0.50–1.35)

## 2012-10-25 LAB — PRO B NATRIURETIC PEPTIDE: Pro B Natriuretic peptide (BNP): 201.8 pg/mL — ABNORMAL HIGH (ref 0–125)

## 2012-10-25 MED ORDER — HYDROMORPHONE HCL PF 1 MG/ML IJ SOLN
1.0000 mg | INTRAMUSCULAR | Status: DC | PRN
  Administered 2012-10-25 – 2012-10-30 (×9): 1 mg via INTRAVENOUS
  Filled 2012-10-25 (×9): qty 1

## 2012-10-25 MED ORDER — BIOTENE DRY MOUTH MT LIQD
15.0000 mL | Freq: Two times a day (BID) | OROMUCOSAL | Status: DC
  Administered 2012-10-25 – 2012-11-01 (×9): 15 mL via OROMUCOSAL

## 2012-10-25 MED ORDER — INFLUENZA VIRUS VACC SPLIT PF IM SUSP
0.5000 mL | Freq: Once | INTRAMUSCULAR | Status: AC
  Administered 2012-10-25: 0.5 mL via INTRAMUSCULAR
  Filled 2012-10-25: qty 0.5

## 2012-10-25 MED ORDER — ONDANSETRON HCL 4 MG PO TABS: 4.0000 mg | ORAL_TABLET | Freq: Four times a day (QID) | ORAL | Status: DC | PRN

## 2012-10-25 MED ORDER — FUROSEMIDE 10 MG/ML IJ SOLN
40.0000 mg | Freq: Once | INTRAMUSCULAR | Status: AC
  Administered 2012-10-25: 40 mg via INTRAVENOUS
  Filled 2012-10-25: qty 4

## 2012-10-25 MED ORDER — DOCUSATE SODIUM 100 MG PO CAPS
100.0000 mg | ORAL_CAPSULE | Freq: Two times a day (BID) | ORAL | Status: DC
  Administered 2012-10-26 – 2012-11-01 (×5): 100 mg via ORAL
  Filled 2012-10-25 (×16): qty 1

## 2012-10-25 MED ORDER — CHLORHEXIDINE GLUCONATE 0.12 % MT SOLN
15.0000 mL | Freq: Two times a day (BID) | OROMUCOSAL | Status: DC
  Administered 2012-10-25 – 2012-11-01 (×13): 15 mL via OROMUCOSAL
  Filled 2012-10-25 (×17): qty 15

## 2012-10-25 MED ORDER — ONDANSETRON HCL 4 MG/2ML IJ SOLN: 4.0000 mg | Freq: Four times a day (QID) | INTRAMUSCULAR | Status: DC | PRN

## 2012-10-25 MED ORDER — ACETAMINOPHEN 325 MG PO TABS
650.0000 mg | ORAL_TABLET | Freq: Once | ORAL | Status: AC
  Administered 2012-10-25: 650 mg via ORAL
  Filled 2012-10-25: qty 2

## 2012-10-25 MED ORDER — DEXTROSE 5 % IV SOLN
1.0000 g | Freq: Once | INTRAVENOUS | Status: AC
  Administered 2012-10-25: 1 g via INTRAVENOUS
  Filled 2012-10-25: qty 10

## 2012-10-25 MED ORDER — DEXTROSE 5 % IV SOLN
1.0000 g | Freq: Every day | INTRAVENOUS | Status: DC
  Administered 2012-10-25 – 2012-10-30 (×6): 1 g via INTRAVENOUS
  Filled 2012-10-25 (×7): qty 10

## 2012-10-25 MED ORDER — DEXTROSE-NACL 5-0.9 % IV SOLN
INTRAVENOUS | Status: DC
  Administered 2012-10-25: 05:00:00 via INTRAVENOUS

## 2012-10-25 MED ORDER — ENOXAPARIN SODIUM 40 MG/0.4ML ~~LOC~~ SOLN
40.0000 mg | Freq: Every day | SUBCUTANEOUS | Status: DC
  Administered 2012-10-25: 40 mg via SUBCUTANEOUS
  Filled 2012-10-25 (×2): qty 0.4

## 2012-10-25 MED ORDER — PNEUMOCOCCAL VAC POLYVALENT 25 MCG/0.5ML IJ INJ
0.5000 mL | INJECTION | Freq: Once | INTRAMUSCULAR | Status: AC
  Administered 2012-10-25: 0.5 mL via INTRAMUSCULAR
  Filled 2012-10-25: qty 0.5

## 2012-10-25 NOTE — Consult Note (Signed)
WOC consult Note Reason for Consult: Consult requested for sacrum.   Pt reports he was incontinent of stool for 2 days and sat in a recliner prior to admission.  He had severe "diaper rash" upon admission, he states, and the underpad was stuck to him. Wound type: Buttocks appear greatly improved from previous description.  Patchy areas of partial thickness skin loss, red and moist.  2 areas of dark purple deep tissue injury over bony prominences to buttocks. Each is 1X1cm. Pressure Ulcer POA: Yes Dressing procedure/placement/frequency: Barrier cream to promote healing, keep turned off affected area.  Pt was previously on an air mattress but did not like it and has declined use.  He is on a bariatric bed at this time.  Explained the importance of repositioning off buttocks to reduce pressure.  No topical treatment is indicated at this time. Will not plan to follow further unless re-consulted.  [address], RN, MSN, [employer]  [phone]

## 2012-10-25 NOTE — ED Notes (Signed)
ZOX:[id number]<AV> Expected date:10/25/12<BR> Expected time:12:35 AM<BR> Means of arrival:Ambulance<BR> Comments:<BR> weakness

## 2012-10-25 NOTE — ED Provider Notes (Signed)
History     CSN: [id number]  Arrival date & time 10/25/12  4098   First MD Initiated Contact with Patient 10/25/12 0107      Chief Complaint  Patient presents with  . Weakness    (Consider location/radiation/quality/duration/timing/severity/associated sxs/prior treatment) HPI History provided by patient. Has history of previous back surgery, lives at home by himself, yesterday having some lower extremity weakness and fell at home. He called EMS at that time but declined transport. Since that time had been sitting in his recliner, unable to get up to use the bathroom, ended up urinating and defecating on himself. Feeling feverish for the last 24 hours. No cough or sore throat. No chest pain or shortness of breath. No abdominal pain. No new lower extremity weakness or numbness. No back pain. Patient admits to strong smelling urine. Symptoms moderate to severe. No history of same. Patient denies any pain injury or trauma from falling Past Medical History  Diagnosis Date  . Malaria     Past Surgical History  Procedure Date  . Back surgery     No family history on file.  History  Substance Use Topics  . Smoking status: Never Smoker   . Smokeless tobacco: Not on file  . Alcohol Use: No      Review of Systems  Constitutional: Negative for fever and chills.  HENT: Negative for neck pain and neck stiffness.   Eyes: Negative for pain.  Respiratory: Negative for shortness of breath.   Cardiovascular: Negative for chest pain.  Gastrointestinal: Negative for abdominal pain.  Genitourinary: Negative for dysuria.  Musculoskeletal: Negative for back pain.  Skin: Negative for rash.  Neurological: Positive for weakness. Negative for headaches.  All other systems reviewed and are negative.    Allergies  Review of patient's allergies indicates not on file.  Home Medications  No current outpatient prescriptions on file.  BP 111/49  Pulse 77  Temp 101.3 F (38.5 C) (Rectal)   Resp 19  SpO2 90%  Physical Exam  Constitutional: He is oriented to person, place, and time. He appears well-developed and well-nourished.  HENT:  Head: Normocephalic and atraumatic.       Posterior pharynx erythema mild exudates. No trismus. Uvula midline. No cervical lymphadenopathy  Eyes: EOM are normal. Pupils are equal, round, and reactive to light.  Neck: Normal range of motion. Neck supple.  Cardiovascular: Normal rate, regular rhythm and intact distal pulses.   Pulmonary/Chest: Effort normal and breath sounds normal. No respiratory distress.  Abdominal: Soft. Bowel sounds are normal. He exhibits no distension. There is no tenderness. There is no rebound and no guarding.       Obese  Musculoskeletal:       No lower extremity deficits with equal dorsi /plantar flexion, and sensorium to light touch throughout.  Chronic venous stasis changes. No cords or erythema. Symmetric 2+ lower extremity edema  Neurological: He is alert and oriented to person, place, and time.        Gait not tested  Skin: Skin is warm and dry.    ED Course  Procedures (including critical care time)  Labs Reviewed  URINALYSIS, ROUTINE W REFLEX MICROSCOPIC - Abnormal; Notable for the following:    Color, Urine AMBER (*)  BIOCHEMICALS MAY BE AFFECTED BY COLOR   APPearance TURBID (*)     pH 8.5 (*)     Hgb urine dipstick LARGE (*)     Ketones, ur 15 (*)     Protein, ur 100 (*)  Nitrite POSITIVE (*)     Leukocytes, UA LARGE (*)     All other components within normal limits  POCT I-STAT, CHEM 8 - Abnormal; Notable for the following:    Sodium 134 (*)     BUN 27 (*)     Creatinine, Ser 1.70 (*)     Glucose, Bld 147 (*)     Calcium, Ion 1.08 (*)     All other components within normal limits  URINE MICROSCOPIC-ADD ON - Abnormal; Notable for the following:    Bacteria, UA MANY (*)     All other components within normal limits  CBC - Abnormal; Notable for the following:    WBC 16.6 (*)     All other  components within normal limits  COMPREHENSIVE METABOLIC PANEL - Abnormal; Notable for the following:    Sodium 132 (*)     Chloride 94 (*)     Glucose, Bld 143 (*)     BUN 26 (*)     Creatinine, Ser 1.44 (*)     Albumin 3.0 (*)     AST 46 (*)     GFR calc non Af Amer 48 (*)     GFR calc Af Amer 56 (*)     All other components within normal limits  RAPID STREP SCREEN  LACTIC ACID, PLASMA  URINALYSIS, ROUTINE W REFLEX MICROSCOPIC  URINE CULTURE  STREP A DNA PROBE   Dg Chest Portable 1 View  10/25/2012  *RADIOLOGY REPORT*  Clinical Data: Fever for 4 days.  PORTABLE CHEST - 1 VIEW  Comparison: Single view of the chest 05/25/2007.  Findings: Lungs are clear.  Heart size normal.  No pneumothorax or pleural effusion.  Elevation of the right hemidiaphragm noted.  IMPRESSION: No acute abnormality.   Original Report Authenticated By: Holley Dexter, M.D.     Date: 10/25/2012  Rate: 94  Rhythm: normal sinus rhythm  QRS Axis: left  Intervals: normal  ST/T Wave abnormalities: nonspecific ST changes  Conduction Disutrbances:first-degree A-V block   Narrative Interpretation:   Old EKG Reviewed: none available    1. Urinary incontinence, functional   2. Bilateral leg weakness   3. Fall at home   4. Obesity   5. UTI (lower urinary tract infection)     IV antibiotics for UTI. Tylenol for fever. IV fluids for clinical dehydration  labs reviewed as above. Medicine consult for admit. Dr Conley Rolls evaluated bedside  MDM  Fever, UTI, generalized weakness - unable to ambulate.  Labs and UA reviewed as above. Medications and IV fluids provided.  Medical admission        Sunnie Nielsen, MD 10/25/12 [phone]

## 2012-10-25 NOTE — Plan of Care (Signed)
Problem: Food- and Nutrition-Related Knowledge Deficit (NB-1.1) Goal: Nutrition education Formal process to instruct or train a patient/client in a skill or to impart knowledge to help patients/clients voluntarily manage or modify food choices and eating behavior to maintain or improve health.  Outcome: Completed/Met Date Met:  10/25/12  Body mass index is 41.87 kg/(m^2). Patient meets criteria for morbid, class III obesity based on current BMI.   Pt reports 46 pound unintended weight gain in the past 5 years. Pt reports having back surgery in 2008 and his mobility has declined significantly since then which pt attributes to causing this unintentional weight gain. Pt lives alone and does his cooking. Pt states he drinks mostly water but occasionally diluted juice. Pt reports consuming small portions of food and following a well balanced diet. Pt reports he always eats standing up in the kitchen which helps him not stay too long in the kitchen or eat too fast. Pt reports he tries to watch his carbohydrate and fat intake. Pt willing to meet with an outpatient RD for further weight loss follow up, will order. Provided handout on healthy eating and weight management tips. Teach back method used. Encouraged continued small frequent well balanced meals. RD contact information provided. Pt expressed understanding. Expect good compliance.

## 2012-10-25 NOTE — ED Notes (Signed)
Pt is from home, having generalized weakness, incontinent of urine/feces, odorous urine. C/a/o x4. 12 lead NSR. 120sbp.

## 2012-10-25 NOTE — H&P (Signed)
[facility] History and Physical  Jerry [name] ZOX:[id number] DOB: 02/09/1946    PCP:   None.  Chief Complaint: fall and urinary incontinence.  HPI: Jerry [name] is an 67 y.o. male with hx of low back pain and prior surgery, hx of malaria while in the service, obesity and sleep apnea with severe medical noncompliant, on no chronic medication, with chonic lower extremity weakness, fell yesterday and had difficulty getting up.  He apparently called EMS, and after getting help, didn't want to go to the ER. He continue to have weakness and difficulty getting around, and couldn't make it to the bathroom, so he voided on himself.  He denied fever, chills, back pain, nausea or vomiting.  Evaluation in the ER showed UA with evidence of infection, WBC of 16K, with Cr 1.7 and BS of 147.  Hospitalist was asked to admit him for volume depletion, UTI, and weakness.  Rewiew of Systems:  Constitutional: Negative for malaise, fever and chills. No significant weight loss or weight gain Eyes: Negative for eye pain, redness and discharge, diplopia, visual changes, or flashes of light. ENMT: Negative for ear pain, hoarseness, nasal congestion, sinus pressure and sore throat. No headaches; tinnitus, drooling, or problem swallowing. Cardiovascular: Negative for chest pain, palpitations, diaphoresis, dyspnea and peripheral edema. ; No orthopnea, PND Respiratory: Negative for cough, hemoptysis, wheezing and stridor. No pleuritic chestpain. Gastrointestinal: Negative for nausea, vomiting, diarrhea, constipation, abdominal pain, melena, blood in stool, hematemesis, jaundice and rectal bleeding.    Genitourinary: Negative for flank pain and hematuria; Musculoskeletal: Negative for back pain and neck pain. Negative for swelling and trauma.;  Skin: . Negative for pruritus, rash, abrasions, bruising and skin lesion.; ulcerations Neuro: Negative for headache, lightheadedness and neck stiffness. Negative for weakness,  altered level of consciousness , altered mental status,  burning feet, involuntary movement, seizure and syncope.  Psych: negative for anxiety, depression, insomnia, tearfulness, panic attacks, hallucinations, paranoia, suicidal or homicidal ideation    Past Medical History  Diagnosis Date  . Malaria     Past Surgical History  Procedure Date  . Back surgery     Medications:  HOME MEDS: Prior to Admission medications   Not on File     Allergies:  Not on File  Social History:   reports that he has never smoked. He does not have any smokeless tobacco history on file. He reports that he does not drink alcohol or use illicit drugs.  Family History: No family history on file.   Physical Exam: Filed Vitals:   10/25/12 0113 10/25/12 0118 10/25/12 0230 10/25/12 0300  BP:   121/51 121/54  Pulse:   87 82  Temp:  101.3 F (38.5 C)    TempSrc:  Rectal    Resp:   23 20  SpO2: 92%  92% 88%   Blood pressure 121/54, pulse 82, temperature 101.3 F (38.5 C), temperature source Rectal, resp. rate 20, SpO2 88.00%.  GEN:  Pleasant patient lying in the stretcher in no acute distress; cooperative with exam. PSYCH:  alert and oriented x4; does not appear anxious or depressed; affect is appropriate. HEENT: Mucous membranes pink and anicteric; PERRLA; EOM intact; no cervical lymphadenopathy nor thyromegaly or carotid bruit; no JVD; There were no stridor. Neck is very supple. Breasts:: Not examined CHEST WALL: No tenderness CHEST: Normal respiration, clear to auscultation bilaterally.  HEART: Regular rate and rhythm.  There are no murmur, rub, or gallops.   BACK: No kyphosis or scoliosis; no CVA tenderness ABDOMEN: soft and  non-tender; no masses, no organomegaly, normal abdominal bowel sounds; no pannus; no intertriginous candida. There is no rebound and no distention. He is quite obese. Rectal Exam: Not done EXTREMITIES: No bone or joint deformity; age-appropriate arthropathy of the  hands and knees; no edema; no ulcerations.  There is no calf tenderness. Genitalia: not examined PULSES: 2+ and symmetric SKIN: Normal hydration no rash or ulceration CNS: Cranial nerves 2-12 grossly intact no focal lateralizing neurologic deficit.  Speech is fluent; uvula elevated with phonation, facial symmetry and tongue midline.cerebella exam is intact, barbinski is negative and strengths are equaled bilaterally.  No sensory loss.  He still has good lower extremity strength with dorsiflexion and plantar flexion as well as adduction and abduction at both thighs   Labs on Admission:  Basic Metabolic Panel:  Lab 10/25/12 4098 10/25/12 0140  NA 134* 132*  K 3.9 3.7  CL 102 94*  CO2 -- 25  GLUCOSE 147* 143*  BUN 27* 26*  CREATININE 1.70* 1.44*  CALCIUM -- 9.1  MG -- --  PHOS -- --   Liver Function Tests:  Lab 10/25/12 0140  AST 46*  ALT 22  ALKPHOS 62  BILITOT 0.9  PROT 7.1  ALBUMIN 3.0*   No results found for this basename: LIPASE:5,AMYLASE:5 in the last 168 hours No results found for this basename: AMMONIA:5 in the last 168 hours CBC:  Lab 10/25/12 0147 10/25/12 0140  WBC -- 16.6*  NEUTROABS -- --  HGB 14.6 14.5  HCT 43.0 42.9  MCV -- 93.5  PLT -- 154   Cardiac Enzymes: No results found for this basename: CKTOTAL:5,CKMB:5,CKMBINDEX:5,TROPONINI:5 in the last 168 hours  CBG: No results found for this basename: GLUCAP:5 in the last 168 hours   Radiological Exams on Admission: Dg Chest Portable 1 View  10/25/2012  *RADIOLOGY REPORT*  Clinical Data: Fever for 4 days.  PORTABLE CHEST - 1 VIEW  Comparison: Single view of the chest 05/25/2007.  Findings: Lungs are clear.  Heart size normal.  No pneumothorax or pleural effusion.  Elevation of the right hemidiaphragm noted.  IMPRESSION: No acute abnormality.   Original Report Authenticated By: Holley Dexter, M.D.      Assessment/Plan Present on Admission:  . UTI (lower urinary tract infection) . Sleep apnea .  Obesity Chronic lower extremity weakness. Urinary incontinence, functional.  PLAN: Will admit him and tx him for UTI and dehydration.  He doesn't have any clinical evidence of acute cord compression, and I think his incontinence is functional (he said he couldn't make it to the bathroom on time).  Will follow him clinically and consider PT evaluation.  He is otherwise stable, full code, and will be admitted to [facility] service.  Thank you for allowing me to partake in the care of your nice patient.  Other plans as per orders.  Code Status: FULL Unk Lightning, MD. [facility] Pager [phone] 7pm to 7am.  10/25/2012, 4:04 AM

## 2012-10-25 NOTE — ED Notes (Signed)
Patient says that he has become increasingly weak and that he has been unable to get up and out of the chair since the day before yesterday. He says he has not been able to get up to use the bathroom or to eat and drink. Pt incontinent of stool and urine on arrival

## 2012-10-25 NOTE — Progress Notes (Signed)
Pt's ex-wife Jeni Salles called to inquire about becoming pt's emergency contact. Pt verbalized consent of having her be his emergency contact and that she could be updated on his plan of care. Her phone number is [phone]. She is traveling on her way up to visit patient and will help him with his care upon d/c.

## 2012-10-25 NOTE — Progress Notes (Signed)
Pt arrived on floor at approx 04:30 am.  Complete bed bath given as pt was covered in urine and stool. Foley placed d/t inability to move and compromised skin.  Pt's sacral area is a purple color however is blanchable.  Mattress overlay in place.  Pt verbalized having "lots" of money in his wallet however did not want it placed in the house safe at this time, pt fell asleep before this nurse could verify the contents of his wallet.

## 2012-10-25 NOTE — Progress Notes (Signed)
CARE MANAGEMENT NOTE 10/25/2012  Patient:  Jerry [name], Jerry [name]   Account Number:  [id number]  Date Initiated:  10/25/2012  Documentation initiated by:  PEARSON,COOKIE  Subjective/Objective Assessment:   Pt admitted with fall and urinary incontinence.     Action/Plan:   from  home   Anticipated DC Date:  10/28/2012   Anticipated DC Plan:  HOME W HOME HEALTH SERVICES      DC Planning Services  CM consult      Choice offered to / List presented to:             Status of service:  In process, will continue to follow Medicare Important Message given?  NA - LOS <3 / Initial given by admissions (If response is "NO", the following Medicare IM given date fields will be blank) Date Medicare IM given:   Date Additional Medicare IM given:    Discharge Disposition:    Per UR Regulation:  Reviewed for med. necessity/level of care/duration of stay  If discussed at Long Length of Stay Meetings, dates discussed:    Comments:  10/25/12 MPearson, RN, BSN Spoke with pt concerning discharge plans. Pt waiting for PT eval, may need SNF.

## 2012-10-25 NOTE — Progress Notes (Signed)
Bilateral:  No evidence of DVT, superficial thrombosis, or Baker's Cyst.   

## 2012-10-25 NOTE — ED Notes (Signed)
Admitting MD at bedside.

## 2012-10-25 NOTE — Progress Notes (Signed)
PT Cancellation Note  Patient Details Name: Jerry [name] MRN: [id number] DOB: [birth year]   Cancelled Treatment:    Reason Eval/Treat Not Completed: Pain limiting ability to participate;Fatigue/lethargy limiting ability to participate   Sarajane Jews 10/25/2012, 3:05 PM Pager: [phone]

## 2012-10-25 NOTE — Progress Notes (Addendum)
Patient ID: Jerry [name], male   DOB: 1946/05/13, 67 y.o.   MRN: [id number]  Patient was seen and examined at bedside. Please refer to admission note done today 10/25/2012. 67 year old male with past medical history of morbid obesity, sleep apnea, lower extremity weakness who presented to ED status post fall at home and difficulty getting up. In ED, patient was found to have urinalysis with evidence of infection, white blood cell count of 16 with elevated creatinine at 1.7. Patient was started on ciprofloxacin for urinary tract infection. Significant findings on physical exam patient was found to have lower extremity swelling which he says is worse since admission.  Assessment and plan:  Principal problem *Fall  Will need physical therapy evaluation  CT head was not done on the admission. We will order CT head for further evaluation especially since patient complains of mental status changes.  Active problems: Lower extremity swelling  Obtain lower extremity venous Doppler to rule out DVT  Will get one dose of Lasix 40 mg IV  Obtained 2-D echo and proBNP  Stop IV fluids Urinary tract infection  Continue Rocephin and followup urine culture results Morbid obesity  Nutrition consult ordered   Jerry [name] Jerry [name] [phone]

## 2012-10-26 DIAGNOSIS — R29898 Other symptoms and signs involving the musculoskeletal system: Secondary | ICD-10-CM | POA: Diagnosis not present

## 2012-10-26 DIAGNOSIS — R3981 Functional urinary incontinence: Secondary | ICD-10-CM | POA: Diagnosis not present

## 2012-10-26 DIAGNOSIS — Y92009 Unspecified place in unspecified non-institutional (private) residence as the place of occurrence of the external cause: Secondary | ICD-10-CM | POA: Diagnosis not present

## 2012-10-26 DIAGNOSIS — I369 Nonrheumatic tricuspid valve disorder, unspecified: Secondary | ICD-10-CM

## 2012-10-26 DIAGNOSIS — W19XXXA Unspecified fall, initial encounter: Secondary | ICD-10-CM | POA: Diagnosis not present

## 2012-10-26 LAB — BASIC METABOLIC PANEL
CO2: 27 mEq/L (ref 19–32)
Calcium: 8.7 mg/dL (ref 8.4–10.5)
Creatinine, Ser: 1.07 mg/dL (ref 0.50–1.35)

## 2012-10-26 LAB — CBC
MCH: 31.2 pg (ref 26.0–34.0)
MCV: 95.1 fL (ref 78.0–100.0)
Platelets: 128 10*3/uL — ABNORMAL LOW (ref 150–400)
RBC: 4.29 MIL/uL (ref 4.22–5.81)
RDW: 14.1 % (ref 11.5–15.5)

## 2012-10-26 LAB — STREP A DNA PROBE: Special Requests: NORMAL

## 2012-10-26 MED ORDER — VITAMINS A & D EX OINT
TOPICAL_OINTMENT | CUTANEOUS | Status: AC
  Administered 2012-10-26: 16:00:00
  Filled 2012-10-26: qty 5

## 2012-10-26 NOTE — Progress Notes (Addendum)
MD, at shift change last night patient's urine was amber in color with sediment.  This am I noticed patient's urine to be tea colored in appearance and very dark.  Patient has been drinking plenty of water throughout the night and output is WNL. Day shift RN was notified of this change and will mention to rounding physician. Arvilla Market, Kerrianne Jeng [place]

## 2012-10-26 NOTE — Progress Notes (Addendum)
[facility] PROGRESS NOTE  Jerry [name] JYN:[id number] DOB: October 13, 1946 DOA: 10/25/2012 PCP: No primary provider on file.  Brief narrative: 67 year old male with past medical history of morbid obesity, sleep apnea, lower extremity weakness who presented to ED status post fall at home and difficulty getting up. In ED, patient was found to have urinalysis with evidence of infection, white blood cell count of 16 with elevated creatinine at 1.7. Patient was started on ciprofloxacin for urinary tract infection.  Significant findings on physical exam patient was found to have lower extremity swelling which he says is worse since admission.   Assessment and plan:   Principal problem  *Fall  Will need physical therapy evaluation  CT head shows no acute intracranial findings Patient is oriented to time, place and person Need PT evaluation for safe discharge plan  Active problems:  Lower extremity swelling  LE venous doppler shows no evidence of DVT  One dose lasix 40 mg IV given 10/25/2012 with some improvement in swelling Obtained 2-D echo and proBNP - BNP only mildly elevated at slightly above 200; 2 D ECHO pending Stopped IV fluids Urinary tract infection  Continue Rocephin and followup urine culture results - preliminary report shows gram negative rods Leukocytosis  Secondary to UTI  Continue rocephin IV Thrombocytopenia  D/C Lovenox and use SCD's bilaterally Morbid obesity  Nutrition consult ordered  Code Status: full code Family Communication: no family at bedside Disposition Plan: needs PT evaluation  [name], MD  [facility] Pager [phone]  If 7PM-7AM, please contact night-coverage [email or web address] Password [email or web address] 10/26/2012, 9:10 AM   LOS: 1 day   Consultants:  Wound care consult  Procedures:  None   Antibiotics:  Rocephin 10/25/2012 -->  HPI/Subjective: No acute overnight events.  Objective: Filed Vitals:   10/25/12 0632 10/25/12 1443 10/25/12 2004  10/26/12 0630  BP: 105/53 103/49 119/51 108/44  Pulse: 85 90 89 76  Temp: 97.4 F (36.3 C) 99 F (37.2 C) 98.6 F (37 C) 98.2 F (36.8 C)  TempSrc: Oral Oral Oral Oral  Resp: 20 20 18 19   Height:      Weight:    164 kg (361 lb 8.9 oz)  SpO2: 95% 93% 92% 98%    Intake/Output Summary (Last 24 hours) at 10/26/12 0910 Last data filed at 10/26/12 9629  Gross per 24 hour  Intake    230 ml  Output   3400 ml  Net  -3170 ml    Exam:   General:  Pt is alert, follows commands appropriately, not in acute distress; morbid obesity  Cardiovascular: Regular rate and rhythm, S1/S2, no murmurs, no rubs, no gallops  Respiratory: Clear to auscultation bilaterally, no wheezing, no crackles, no rhonchi  Abdomen: Soft, non tender, obese, non distended, bowel sounds present, no guarding  Extremities: LE edema, pulses DP and PT palpable bilaterally  Neuro: Grossly nonfocal  Data Reviewed: Basic Metabolic Panel:  Lab 10/26/12 5284 10/25/12 0147 10/25/12 0140  NA 136 134* 132*  K 3.6 3.9 3.7  CL 97 102 94*  CO2 27 -- 25  GLUCOSE 154* 147* 143*  BUN 17 27* 26*  CREATININE 1.07 1.70* 1.44*  CALCIUM 8.7 -- 9.1   Liver Function Tests:  Lab 10/25/12 0140  AST 46*  ALT 22  ALKPHOS 62  BILITOT 0.9  PROT 7.1  ALBUMIN 3.0*   CBC:  Lab 10/26/12 0401 10/25/12 0510 10/25/12 0147 10/25/12 0140  WBC 11.2* 14.4* -- 16.6*  HGB 13.4 13.0 14.6 14.5  HCT 40.8 39.3  43.0 42.9  MCV 95.1 93.3 -- 93.5  PLT 128* 134* -- 154   URINE CULTURE     Status: Normal (Preliminary result)   Collection Time   10/25/12  1:22 AM      Component Value Range Status Comment   Specimen Description URINE, CATH  Final    Culture GRAM NEGATIVE RODS   Final    Report Status PENDING   Incomplete   RAPID STREP SCREEN     Status: Normal   Collection Time   10/25/12  1:57 AM      Component Value Range Status Comment   Streptococcus, Group A Screen (Direct) NEGATIVE  NEGATIVE Final      Studies: Ct Head Wo  Contrast 10/25/2012    IMPRESSION: No skull fracture or intracranial hemorrhage.     Dg Chest Portable 1 View 10/25/2012  * IMPRESSION: No acute abnormality.      Scheduled Meds:   . cefTRIAXone   1 g Intravenous QHS  . docusate sodium  100 mg Oral BID  . enoxaparin (LOVENOX)   40 mg Subcutaneous Daily

## 2012-10-26 NOTE — Evaluation (Signed)
Physical Therapy Evaluation Patient Details Name: Jerry [name] MRN: [id number] DOB: 1946-01-11 Today's Date: 10/26/2012 Time: 1191-4782 PT Time Calculation (min): 44 min  PT Assessment / Plan / Recommendation Clinical Impression  89 you male admitted after a fall at home and weakness in LE's limiting ambulation. He was found to have a UTI.  Pt with hx of previous spinal surgery and required a RW to walk at home.  He is very motivated and wants to return to his independence at home.  He will benefit fom continued PT CIR vs SNF    PT Assessment  Patient needs continued PT services    Follow Up Recommendations  CIR;SNF    Does the patient have the potential to tolerate intense rehabilitation      Barriers to Discharge        Equipment Recommendations  None recommended by PT    Recommendations for Other Services Rehab consult   Frequency      Precautions / Restrictions Precautions Precautions: Fall Restrictions Weight Bearing Restrictions: No   Pertinent Vitals/Pain Pt with some c/o pain in back and legs      Mobility  Bed Mobility Bed Mobility: Rolling Right;Right Sidelying to Sit;Sit to Supine Rolling Right: 3: Mod assist Right Sidelying to Sit: HOB elevated;1: +2 Total assist Right Sidelying to Sit: Patient Percentage: 30% Sit to Supine: 1: +2 Total assist Sit to Supine: Patient Percentage: 20% Details for Bed Mobility Assistance: pt needs assit to move legs on bed, but is able to use arms to pull up  Transfers Transfers: Sit to Stand;Stand to Sit Sit to Stand: 1: +2 Total assist;From elevated surface;With upper extremity assist;From bed Sit to Stand: Patient Percentage: 80% Stand to Sit: 1: +2 Total assist;With upper extremity assist;To bed Stand to Sit: Patient Percentage: 80% Details for Transfer Assistance: pt needs to have bed elevated to attempt to stand and he takes extra time to perfrom task.  Once in standing, pt is able to extend trunk into erect  standing Ambulation/Gait Ambulation/Gait Assistance: 3: Mod assist Ambulation Distance (Feet): 2 Feet (sidestep to head of bed) Assistive device: Rolling walker (bariatric) Ambulation/Gait Assistance Details: pt relies heavily on UE's to assume weight bearing and allow  advancement of LE's Gait Pattern: Decreased step length - right;Decreased step length - left;Step-to pattern Gait velocity: decreased General Gait Details: pt moves slowly and relies heavily in UE's Stairs: No Wheelchair Mobility Wheelchair Mobility: No    Shoulder Instructions     Exercises General Exercises - Lower Extremity Ankle Circles/Pumps: AAROM;Both;AROM;5 reps;Supine Quad Sets: AROM;Both;5 reps;Supine Gluteal Sets: AROM;Both;5 reps;Supine Other Exercises Other Exercises: deep breathing  Other Exercises: abdominal sets   PT Diagnosis: Difficulty walking;Generalized weakness  PT Problem List: Decreased strength;Decreased activity tolerance;Decreased balance;Decreased mobility PT Treatment Interventions: DME instruction;Gait training;Functional mobility training;Therapeutic activities;Therapeutic exercise;Patient/family education   PT Goals Acute Rehab PT Goals PT Goal Formulation: With patient Time For Goal Achievement: 11/09/12 Potential to Achieve Goals: Good Pt will go Supine/Side to Sit: with min assist PT Goal: Supine/Side to Sit - Progress: Goal set today Pt will go Sit to Supine/Side: with min assist PT Goal: Sit to Supine/Side - Progress: Goal set today Pt will go Sit to Stand: with modified independence PT Goal: Sit to Stand - Progress: Goal set today Pt will go Stand to Sit: with modified independence PT Goal: Stand to Sit - Progress: Goal set today Pt will Stand: with modified independence;6 - 10 min;with bilateral upper extremity support PT Goal: Stand - Progress: Goal  set today Pt will Ambulate: 51 - 150 feet;with min assist;with least restrictive assistive device PT Goal: Ambulate -  Progress: Goal set today  Visit Information  Last PT Received On: 10/26/12 Assistance Needed: +2    Subjective Data  Subjective: pt very encouraged with his ability to stand Patient Stated Goal: to get back home   Prior Functioning  Home Living Lives With: Alone Available Help at Discharge: Neighbor;Family;Friend(s) Type of Home: Other (Comment) (townhome) Home Layout: Two level Alternate Level Stairs-Number of Steps: pt has a stair lift.  doesn't go upstairs much Home Adaptive Equipment: Walker - rolling Prior Function Level of Independence: Independent with assistive device(s) Comments: pt states he uses a walker all the time Communication Communication: No difficulties    Cognition  Overall Cognitive Status: Appears within functional limits for tasks assessed/performed Arousal/Alertness: Awake/alert Orientation Level: Appears intact for tasks assessed Behavior During Session: [facility] for tasks performed    Extremity/Trunk Assessment Right Lower Extremity Assessment RLE ROM/Strength/Tone: Deficits RLE ROM/Strength/Tone Deficits: pt with < 3/5 strength in LE's.  Pt has trophic changes in skin RLE Sensation: WFL - Light Touch;WFL - Proprioception RLE Coordination: WFL - gross motor Left Lower Extremity Assessment LLE ROM/Strength/Tone: Deficits LLE ROM/Strength/Tone Deficits: <3/5 strength and trophic changes in skin LLE Sensation: WFL - Light Touch;WFL - Proprioception LLE Coordination: WFL - gross motor Trunk Assessment Trunk Assessment: Other exceptions Trunk Exceptions: obese abdomen   Balance Balance Balance Assessed: Yes Static Sitting Balance Static Sitting - Balance Support: Bilateral upper extremity supported;Feet supported Static Sitting - Level of Assistance: 5: Stand by assistance Static Sitting - Comment/# of Minutes: > 10 minutes.  Pt enjoyed sitting on EOB Static Standing Balance Static Standing - Balance Support: Bilateral upper extremity  supported Static Standing - Level of Assistance: 6: Modified independent (Device/Increase time)  End of Session PT - End of Session Activity Tolerance: Patient tolerated treatment well Patient left: in bed Nurse Communication: Mobility status  GP     Rosey Bath K. [place], [place] [phone] 10/26/2012, 5:32 PM

## 2012-10-26 NOTE — Progress Notes (Signed)
  Echocardiogram 2D Echocardiogram has been performed.  Jerry [name] 10/26/2012, 11:09 AM

## 2012-10-27 DIAGNOSIS — W19XXXA Unspecified fall, initial encounter: Secondary | ICD-10-CM | POA: Diagnosis not present

## 2012-10-27 DIAGNOSIS — R3981 Functional urinary incontinence: Secondary | ICD-10-CM | POA: Diagnosis not present

## 2012-10-27 DIAGNOSIS — R29898 Other symptoms and signs involving the musculoskeletal system: Secondary | ICD-10-CM | POA: Diagnosis not present

## 2012-10-27 DIAGNOSIS — Y92009 Unspecified place in unspecified non-institutional (private) residence as the place of occurrence of the external cause: Secondary | ICD-10-CM | POA: Diagnosis not present

## 2012-10-27 LAB — CBC
MCV: 94.2 fL (ref 78.0–100.0)
Platelets: 136 10*3/uL — ABNORMAL LOW (ref 150–400)
RDW: 13.7 % (ref 11.5–15.5)
WBC: 10.3 10*3/uL (ref 4.0–10.5)

## 2012-10-27 LAB — URINE CULTURE: Colony Count: >=100000

## 2012-10-27 MED ORDER — POLYETHYLENE GLYCOL 3350 17 G PO PACK
17.0000 g | PACK | Freq: Every day | ORAL | Status: DC
  Administered 2012-10-27 – 2012-10-28 (×2): 17 g via ORAL
  Filled 2012-10-27 (×6): qty 1

## 2012-10-27 NOTE — Progress Notes (Signed)
Patient OOB and up to the chair this morning for breakfast. He got up with 2 person assist and the walker and pivoted from the bed to the chair. Patient remained in the chair until after lunch and then returned to the bed. Tolerated fairly. Angelena Form, RN

## 2012-10-27 NOTE — Progress Notes (Signed)
Clinical Social Work Department BRIEF PSYCHOSOCIAL ASSESSMENT 10/27/2012  Patient:  Jerry [name], Jerry [name]     Account Number:  [id number]     Admit date:  10/25/2012  Clinical Social Worker:  Leron Croak, CLINICAL SOCIAL WORKER  Date/Time:  10/27/2012 06:12 PM  Referred by:  Physician  Date Referred:  10/25/2012 Referred for  SNF Placement   Other Referral:   Interview type:  Patient Other interview type:    PSYCHOSOCIAL DATA Living Status:  ALONE Admitted from facility:   Level of care:   Primary support name:  Henrine Screws Primary support relationship to patient:  NONE Degree of support available:   Pt has limited support at home, but some support through friends.    CURRENT CONCERNS Current Concerns  Post-Acute Placement   Other Concerns:    SOCIAL WORK ASSESSMENT / PLAN CSW met with the Pt at the bedside. Pt was not aware of the referral but stated that he "knew he needed some kind of rehab". Pt stated that he has previosly spent time in [facility] and that he would not mind going back to that facility. Pt also stated that he would be interested in [place]. (Pt is a Artist)  CSW was given permission to fax out information to [employer] and Pt will also be assessed by CIR on Monday. Pt unsure if he would like to go there but willing to interview.   Assessment/plan status:  Information/Referral to [facility] Other assessment/ plan:   Information/referral to community resources:   CSW provided contact information and a listing of SNF facilities in [place] county    PATIENT'S/FAMILY'S RESPONSE TO PLAN OF CARE: Pt was appreciative and stated that "he has not met one person that was not nice".      Leron Croak, LCSWA [employer] [phone]

## 2012-10-27 NOTE — Progress Notes (Addendum)
Clinical Social Work Department CLINICAL SOCIAL WORK PLACEMENT NOTE 10/27/2012  Patient:  Jerry [name], Jerry [name]  Account Number:  [id number] Admit date:  10/25/2012  Clinical Social Worker:  Leron Croak, CLINICAL SOCIAL WORKER  Date/time:  10/27/2012 06:19 PM  Clinical Social Work is seeking post-discharge placement for this patient at the following level of care:   SKILLED NURSING   (*CSW will update this form in Epic as items are completed)   10/27/2012  Patient/family provided with [facility] Department of Clinical Social Work's list of facilities offering this level of care within the geographic area requested by the patient (or if unable, by the patient's family).  10/27/2012  Patient/family informed of their freedom to choose among providers that offer the needed level of care, that participate in Medicare, Medicaid or managed care program needed by the patient, have an available bed and are willing to accept the patient.  10/27/2012  Patient/family informed of [facility]' ownership interest in [facility], as well as of the fact that they are under no obligation to receive care at this facility.  PASARR submitted to EDS on 06/04/2007 PASARR number received from EDS on 06/04/2007  FL2 transmitted to all facilities in geographic area requested by pt/family on  10/27/2012 FL2 transmitted to all facilities within larger geographic area on 10/27/2012  Patient informed that his/her managed care company has contracts with or will negotiate with  certain facilities, including the following:     Patient/family informed of bed offers received:  10/28/2012 and 10/31/2012 Patient chooses bed at [facility] Physician recommends and patient chooses bed at    Patient to be transferred to  on  [facility] on 11/01/2012 Patient to be transferred to facility by [facility]  The following physician request were entered in Epic:   Additional  Comments:  Jacklynn Lewis, MSW, LCSWA  Clinical Social Work [phone]

## 2012-10-27 NOTE — Progress Notes (Addendum)
[facility] PROGRESS NOTE  Jerry [name] ZOX:[id number] DOB: Mar 08, 1946 DOA: 10/25/2012 PCP: No primary provider on file.  Brief narrative: 67 year old male with past medical history of morbid obesity, sleep apnea, lower extremity weakness who presented to ED status post fall at home and difficulty getting up. In ED, patient was found to have urinalysis with evidence of infection, white blood cell count of 16 with elevated creatinine at 1.7. Patient was started on ciprofloxacin for urinary tract infection. For lower extremity edema we have started lasix and patient feels much better and swelling is improving.  Assessment and plan:   Principal problem  *Fall  Per PT - CIR recomended CT head shows no acute intracranial findings  Patient is oriented to time, place and person   Active problems:  Lower extremity swelling  LE venous doppler shows no evidence of DVT  One dose lasix 40 mg IV given 10/25/2012 with some improvement in swelling  Obtained 2-D echo and proBNP - BNP only mildly elevated at slightly above 200; 2 D ECHO with EF 55% Stopped IV fluids Urinary tract infection  Continue Rocephin and followup urine culture results - preliminary report shows gram negative rods Leukocytosis   Secondary to UTI due to proteus mirabilis  Continue rocephin IV - sensitive to ceftriaxone Thrombocytopenia  D/Ced Lovenox and use SCD's bilaterally Morbid obesity  Nutrition consult ordered   Code Status: full code  Family Communication: no family at bedside  Disposition Plan: needs PT evaluation   [name], MD  [facility]  Pager [phone]   Consultants:  Wound care consult Inpatient rehab consult for CIR Procedures:  None  Antibiotics:  Rocephin 10/25/2012 -->  If 7PM-7AM, please contact night-coverage [email or web address] Password [facility] 10/27/2012, 8:36 AM   LOS: 2 days   HPI/Subjective: Sitting in chair, comfortable.  Objective: Filed Vitals:   10/25/12 2004 10/26/12 0630  10/26/12 2233 10/27/12 0610  BP: 119/51 108/44 134/63 111/57  Pulse: 89 76 71 64  Temp: 98.6 F (37 C) 98.2 F (36.8 C) 98 F (36.7 C) 97.7 F (36.5 C)  TempSrc: Oral Oral Oral Oral  Resp: 18 19 20 20   Height:      Weight:  164 kg (361 lb 8.9 oz)    SpO2: 92% 98% 92% 94%    Intake/Output Summary (Last 24 hours) at 10/27/12 0836 Last data filed at 10/27/12 8295  Gross per 24 hour  Intake    240 ml  Output   1575 ml  Net  -1335 ml    Exam:   General:  Pt is alert, follows commands appropriately, not in acute distress; morbid obesity  Cardiovascular: Regular rate and rhythm, S1/S2, no murmurs, no rubs, no gallops  Respiratory: Clear to auscultation bilaterally, no wheezing, no crackles, no rhonchi  Abdomen: Soft, non tender, non distended, bowel sounds present, no guarding  Extremities: LE edema improving, pulses DP and PT palpable bilaterally; chronic skin changes  Neuro: Grossly nonfocal  Data Reviewed: Basic Metabolic Panel:  Lab 10/26/12 6213 10/25/12 0510 10/25/12 0147 10/25/12 0140  NA 136 -- 134* 132*  K 3.6 -- 3.9 3.7  CL 97 -- 102 94*  CO2 27 -- -- 25  GLUCOSE 154* -- 147* 143*  BUN 17 -- 27* 26*  CREATININE 1.07 1.32 1.70* 1.44*  CALCIUM 8.7 -- -- 9.1   Liver Function Tests:  Lab 10/25/12 0140  AST 46*  ALT 22  ALKPHOS 62  BILITOT 0.9  PROT 7.1  ALBUMIN 3.0*   CBC:  Lab 10/27/12  0402 10/26/12 0401 10/25/12 0510 10/25/12 0147 10/25/12 0140  WBC 10.3 11.2* 14.4* -- 16.6*  HGB 12.9* 13.4 13.0 14.6 14.5  HCT 38.8* 40.8 39.3 43.0 42.9  MCV 94.2 95.1 93.3 -- 93.5  PLT 136* 128* 134* -- 154    URINE CULTURE     Status: Normal   Collection Time   10/25/12  1:22 AM      Component Value Range Status Comment   Specimen Description URINE, CATH  Final    Culture PROTEUS MIRABILIS   Final    Report Status 10/27/2012 FINAL   Final    Organism ID, Bacteria PROTEUS MIRABILIS   Final   RAPID STREP SCREEN     Status: Normal   Collection Time    10/25/12  1:57 AM      Component Value Range Status Comment   Streptococcus, Group A Screen (Direct) NEGATIVE  NEGATIVE Final   STREP A DNA PROBE     Status: Normal   Collection Time   10/25/12  1:57 AM      Component Value Range Status Comment   Specimen Description THROAT   Final    Special Requests Normal   Final    Group A Strep Probe NEGATIVE   Final    Report Status 10/26/2012 FINAL   Final      Studies: Ct Head Wo Contrast 10/25/2012  *RADIOLOGY REPORT*  Clinical Data: Fall.  CT HEAD WITHOUT CONTRAST  Technique:  Contiguous axial images were obtained from the base of the skull through the vertex without contrast.  Comparison: None.  Findings: No skull fracture or intracranial hemorrhage.  Mild atrophy without hydrocephalus.  No CT evidence of large acute infarct.  No intracranial mass lesion detected on this unenhanced exam.  Vascular calcifications.  Orbital structures unremarkable.  Mastoid air cells, middle ear cavities and visualized paranasal sinuses are clear.  IMPRESSION: No skull fracture or intracranial hemorrhage.      Scheduled Meds:  . cefTRIAXone   1 g Intravenous QHS  . chlorhexidine  15 mL Mouth Rinse BID  . docusate sodium  100 mg Oral BID

## 2012-10-28 ENCOUNTER — Encounter (HOSPITAL_COMMUNITY): Payer: Self-pay | Admitting: Physical Medicine and Rehabilitation

## 2012-10-28 DIAGNOSIS — R29898 Other symptoms and signs involving the musculoskeletal system: Secondary | ICD-10-CM | POA: Diagnosis not present

## 2012-10-28 DIAGNOSIS — W19XXXA Unspecified fall, initial encounter: Secondary | ICD-10-CM | POA: Diagnosis not present

## 2012-10-28 DIAGNOSIS — A4189 Other specified sepsis: Secondary | ICD-10-CM | POA: Diagnosis not present

## 2012-10-28 DIAGNOSIS — R5381 Other malaise: Secondary | ICD-10-CM

## 2012-10-28 DIAGNOSIS — R3981 Functional urinary incontinence: Secondary | ICD-10-CM | POA: Diagnosis not present

## 2012-10-28 DIAGNOSIS — Y92009 Unspecified place in unspecified non-institutional (private) residence as the place of occurrence of the external cause: Secondary | ICD-10-CM | POA: Diagnosis not present

## 2012-10-28 LAB — CBC
Hemoglobin: 12.6 g/dL — ABNORMAL LOW (ref 13.0–17.0)
MCHC: 32.8 g/dL (ref 30.0–36.0)
Platelets: 150 10*3/uL (ref 150–400)
RDW: 13.8 % (ref 11.5–15.5)

## 2012-10-28 LAB — BASIC METABOLIC PANEL
GFR calc Af Amer: >90 mL/min (ref >90)
GFR calc non Af Amer: 88 mL/min — ABNORMAL LOW (ref >90)
Glucose, Bld: 119 mg/dL — ABNORMAL HIGH (ref 70–99)
Potassium: 3.5 mEq/L (ref 3.5–5.1)
Sodium: 137 mEq/L (ref 135–145)

## 2012-10-28 NOTE — Progress Notes (Addendum)
[facility] PROGRESS NOTE  Jerry [name] ZOX:[id number] DOB: 03/17/1946 DOA: 10/25/2012 PCP: No primary provider on file.  Brief narrative: 67 year old male with past medical history of morbid obesity, sleep apnea, lower extremity weakness who presented to ED status post fall at home and difficulty getting up. In ED, patient was found to have urinalysis with evidence of infection, white blood cell count of 16 with elevated creatinine at 1.7. Patient was started on ciprofloxacin for urinary tract infection. For lower extremity edema we have started lasix and patient feels much better and swelling is improving.   Assessment and plan:   Principal problem  *Fall  Per PT - CIR recommended; evaluation pending CT head shows no acute intracranial findings  Patient is oriented to time, place and person   Active problems:  Lower extremity swelling  LE venous doppler shows no evidence of DVT  One dose lasix 40 mg IV given 10/25/2012 with some improvement in swelling  Obtained 2-D echo and proBNP - BNP only mildly elevated at slightly above 200; 2 D ECHO with EF 55%  Stopped IV fluids; encouraged oral intake Urinary tract infection  Secondary to Proteus mirabilis Continue Rocephin Leukocytosis   Secondary to UTI due to proteus mirabilis   Continue rocephin IV - sensitive to ceftriaxone  Thrombocytopenia  D/Ced Lovenox and use SCD's bilaterally Morbid obesity  Nutrition consult ordered   Code Status: full code  Family Communication: no family at bedside  Disposition Plan: PT recommended inpatient rehabilitation, evaluation pending  [name], MD  [facility]  Pager [phone]   Consultants:  Wound care consult  Inpatient rehab consult for CIR Procedures:  None  Antibiotics:  Rocephin 10/25/2012 -->  If 7PM-7AM, please contact night-coverage [email or web address] Password [facility] 10/28/2012, 4:13 PM   LOS: 3 days   HPI/Subjective: Feels better. In no acute overnight  events.  Objective: Filed Vitals:   10/27/12 1400 10/27/12 2235 10/28/12 0545 10/28/12 1448  BP: 121/53 134/60 131/62 128/66  Pulse: 69 64 63 73  Temp: 98 F (36.7 C) 98 F (36.7 C) 97.5 F (36.4 C) 97.9 F (36.6 C)  TempSrc: Oral Oral Oral   Resp: 20 16 16 18   Height:      Weight:      SpO2: 94% 94% 95% 94%    Intake/Output Summary (Last 24 hours) at 10/28/12 1613 Last data filed at 10/28/12 1300  Gross per 24 hour  Intake   1010 ml  Output   2125 ml  Net  -1115 ml    Exam:   General:  Pt is alert, follows commands appropriately, not in acute distress  Cardiovascular: Regular rate and rhythm, S1/S2, no murmurs, no rubs, no gallops  Respiratory: Clear to auscultation bilaterally, no wheezing, no crackles, no rhonchi  Abdomen: Soft, non tender, non distended, bowel sounds present, no guarding  Extremities: Lower extremity edema, chronic skin changes, pulses DP and PT palpable bilaterally  Neuro: Grossly nonfocal  Data Reviewed: Basic Metabolic Panel:  Lab 10/28/12 8295 10/26/12 0401 10/25/12 0510 10/25/12 0147 10/25/12 0140  NA 137 136 -- 134* 132*  K 3.5 3.6 -- 3.9 3.7  CL 98 97 -- 102 94*  CO2 30 27 -- -- 25  GLUCOSE 119* 154* -- 147* 143*  BUN 16 17 -- 27* 26*  CREATININE 0.84 1.07 1.32 1.70* 1.44*  CALCIUM 8.6 8.7 -- -- 9.1  MG -- -- -- -- --  PHOS -- -- -- -- --   Liver Function Tests:  Lab 10/25/12 0140  AST 46*  ALT 22  ALKPHOS 62  BILITOT 0.9  PROT 7.1  ALBUMIN 3.0*   No results found for this basename: LIPASE:5,AMYLASE:5 in the last 168 hours No results found for this basename: AMMONIA:5 in the last 168 hours CBC:  Lab 10/28/12 0356 10/27/12 0402 10/26/12 0401 10/25/12 0510 10/25/12 0147 10/25/12 0140  WBC 9.4 10.3 11.2* 14.4* -- 16.6*  NEUTROABS -- -- -- -- -- --  HGB 12.6* 12.9* 13.4 13.0 14.6 --  HCT 38.4* 38.8* 40.8 39.3 43.0 --  MCV 94.6 94.2 95.1 93.3 -- 93.5  PLT 150 136* 128* 134* -- 154   Cardiac Enzymes: No results  found for this basename: CKTOTAL:5,CKMB:5,CKMBINDEX:5,TROPONINI:5 in the last 168 hours BNP: No components found with this basename: POCBNP:5 CBG: No results found for this basename: GLUCAP:5 in the last 168 hours  Recent Results (from the past 240 hour(s))  URINE CULTURE     Status: Normal   Collection Time   10/25/12  1:22 AM      Component Value Range Status Comment   Specimen Description URINE, CATHETERIZED   Final    Special Requests NONE   Final    Culture  Setup Time 10/25/2012 08:56   Final    Colony Count >=100,000 COLONIES/ML   Final    Culture PROTEUS MIRABILIS   Final    Report Status 10/27/2012 FINAL   Final    Organism ID, Bacteria PROTEUS MIRABILIS   Final   RAPID STREP SCREEN     Status: Normal   Collection Time   10/25/12  1:57 AM      Component Value Range Status Comment   Streptococcus, Group A Screen (Direct) NEGATIVE  NEGATIVE Final   STREP A DNA PROBE     Status: Normal   Collection Time   10/25/12  1:57 AM      Component Value Range Status Comment   Specimen Description THROAT   Final    Special Requests Normal   Final    Group A Strep Probe NEGATIVE   Final    Report Status 10/26/2012 FINAL   Final      Studies: No results found.  Scheduled Meds:   . antiseptic oral rinse  15 mL Mouth Rinse q12n4p  . cefTRIAXone (ROCEPHIN)  IV  1 g Intravenous QHS  . chlorhexidine  15 mL Mouth Rinse BID  . docusate sodium  100 mg Oral BID  . polyethylene glycol  17 g Oral Daily   Continuous Infusions:

## 2012-10-28 NOTE — Progress Notes (Signed)
Will see pt Tuesday am to discuss CIR.  [phone]

## 2012-10-28 NOTE — Progress Notes (Signed)
Physical Therapy Treatment Patient Details Name: Jerry [name] MRN: [id number] DOB: 11/13/1945 Today's Date: 10/28/2012 Time: 4098-1191 PT Time Calculation (min): 25 min  PT Assessment / Plan / Recommendation Comments on Treatment Session  Pt very motivated.  Does not want to D/C to SNF as he has done that before.  Plans to D/C to CIR and beleive he will do well.      Follow Up Recommendations  CIR     Does the patient have the potential to tolerate intense rehabilitation     Barriers to Discharge        Equipment Recommendations  None recommended by PT    Recommendations for Other Services    Frequency Min 3X/week   Plan Discharge plan remains appropriate    Precautions / Restrictions Precautions Precautions: Fall Precaution Comments: O2 Restrictions Weight Bearing Restrictions: No   Pertinent Vitals/Pain No c/o pain   Mobility  Bed Mobility Bed Mobility: Not assessed Details for Bed Mobility Assistance: Pt OOB in recliner Transfers Transfers: Sit to Stand;Stand to Sit Sit to Stand: 1: +2 Total assist;From chair/3-in-1 Sit to Stand: Patient Percentage: 80% Stand to Sit: 1: +2 Total assist Stand to Sit: Patient Percentage: 80% Details for Transfer Assistance: increased time and <25% VC's to scoot forward and use B UE's to push up from chair. Ambulation/Gait Ambulation/Gait Assistance: 1: +2 Total assist Ambulation/Gait: Patient Percentage: 80% Ambulation Distance (Feet): 55 Feet Assistive device: Rolling walker;Other (Comment) (Bariatric RW) Ambulation/Gait Assistance Details: Pt requires + 2 assist for safety and equipment (O2/chair) First amb on RA sats lowest 82% so reapplied 2 lts nasal sats avg 94%.  Pt required increased time and 25% VC's on proper purse lip breathing which help expell CO2 and increase his O2 levels.  Gait Pattern: Step-through pattern;Decreased stride length Gait velocity: decreased     PT Goals                              progressing     Visit Information  Last PT Received On: 10/28/12 Assistance Needed: +2    Subjective Data  Subjective: Can you come back again tomorrow? Patient Stated Goal: to walk more   Cognition       Balance     End of Session PT - End of Session Equipment Utilized During Treatment: Gait belt;Oxygen (2 lts) Activity Tolerance: Patient limited by fatigue Patient left: in chair;with call bell/phone within reach   Felecia Shelling  PTA [facility]  Acute  Rehab Pager     [phone]

## 2012-10-28 NOTE — Progress Notes (Signed)
Clinical Social Worker continuing to follow to assist with disposition planning. Pt options at this time are CIR vs SNF for rehab. Clinical Social Worker reviewed chart and noted CIR MD has seen pt and recommends CIR and awaiting rehab RN to follow up. Clinical Social Worker met with pt at bedside to provide SNF bed offers as a secondary option to CIR. Clinical Social Worker provided support and clarified pt questions. Pt discussed that he has some support from friends, but would be willing to hire private care help if needed following rehab. Pt appreciative of CSW support and assistance and is considering options at this time, but seems to prefer CIR. Clinical Social Worker to continue to follow.  Jacklynn Lewis, MSW, LCSWA  Clinical Social Work [phone]

## 2012-10-28 NOTE — Consult Note (Signed)
Physical Medicine and Rehabilitation Consult Reason for Consult: Deconditioning.  Referring Physician: Dr. Elisabeth Pigeon.    HPI: Jerry [name] is a 67 y.o. male with past medical history of morbid obesity, sleep apnea, lower extremity weakness who was admitted on 10/25/12 with two day history of weakness with incontinence, fall and difficulty getting up. In ED, patient was found to have urinalysis with evidence of infection, white blood cell count of 16 with elevated creatinine at 1.7 as well as LE edema. Patient was started on ciprofloxacin for urinary tract infection and diuretics for peripheral edema. UCS with >100,000 proteus mirabilis. BLE dopplers without evidence of DVT. 2D echo with EF 50-55% with evidence of grade 2 diastolic dysfunction. PT evaluation done yesterday and patient noted to be deconditioned. MD, PT recommending CIR.   Review of Systems  HENT: Negative for hearing loss.   Eyes: Negative for blurred vision and double vision.  Respiratory: Negative for cough and shortness of breath.   Cardiovascular: Negative for chest pain and palpitations.  Gastrointestinal: Negative for heartburn and diarrhea.  Genitourinary: Negative for urgency and frequency.  Musculoskeletal: Negative for falls (very careful and takes time with walking. ).  Neurological: Positive for tingling (weakness and tingling BUE/BLE since '08.) and sensory change. Negative for headaches.  Psychiatric/Behavioral: Negative for memory loss. The patient does not have insomnia.    Past Medical History  Diagnosis Date  . Malaria   . Cervical myelopathy    Past Surgical History  Procedure Date  . Back surgery     cervical and thoracic decompression for myelopathy   Family History  Problem Relation Age of Onset  . Heart disease Mother   . Heart disease Father   . Dementia Mother   . Dementia Father     Social History: Lives alone. 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 Sedentary--sits  most of the day. Difficulty with transitional movements--uses walker and has stair lift at home. He  reports that he has never smoked. He does not have any smokeless tobacco history on file. He reports that he does not drink alcohol or use illicit drugs.  Allergies: No Known Allergies  No prescriptions prior to admission    Home: Home Living Lives With: Alone Available Help at Discharge: Neighbor;Family;Friend(s) Type of Home: Other (Comment) (townhome) Home Layout: Two level Alternate Level Stairs-Number of Steps: pt has a stair lift.  doesn't go upstairs much Home Adaptive Equipment: Walker - rolling  Functional History: Prior Function Comments: pt states he uses a walker all the time Functional Status:  Mobility: Bed Mobility Bed Mobility: Rolling Right;Right Sidelying to Sit;Sit to Supine Rolling Right: 3: Mod assist Right Sidelying to Sit: HOB elevated;1: +2 Total assist Right Sidelying to Sit: Patient Percentage: 30% Sit to Supine: 1: +2 Total assist Sit to Supine: Patient Percentage: 20% Transfers Transfers: Sit to Stand;Stand to Sit Sit to Stand: 1: +2 Total assist;From elevated surface;With upper extremity assist;From bed Sit to Stand: Patient Percentage: 80% Stand to Sit: 1: +2 Total assist;With upper extremity assist;To bed Stand to Sit: Patient Percentage: 80% Ambulation/Gait Ambulation/Gait Assistance: 3: Mod assist Ambulation Distance (Feet): 2 Feet (sidestep to head of bed) Assistive device: Rolling walker (bariatric) Ambulation/Gait Assistance Details: pt relies heavily on UE's to assume weight bearing and allow  advancement of LE's Gait Pattern: Decreased step length - right;Decreased step length - left;Step-to pattern Gait velocity: decreased General Gait Details: pt moves slowly and relies heavily in UE's Stairs: No Naval architect  Mobility: No  ADL:    Cognition: Cognition Arousal/Alertness: Awake/alert Orientation Level: Oriented  X4 Cognition Overall Cognitive Status: Appears within functional limits for tasks assessed/performed Arousal/Alertness: Awake/alert Orientation Level: Appears intact for tasks assessed Behavior During Session: [facility] for tasks performed  Blood pressure 131/62, pulse 63, temperature 97.5 F (36.4 C), temperature source Oral, resp. rate 16, height 6\' 4"  (1.93 m), weight 164 kg (361 lb 8.9 oz), SpO2 95.00%. Physical Exam  Nursing note and vitals reviewed. Constitutional: He is oriented to person, place, and time. He appears well-developed and well-nourished.       Morbidly obese  HENT:  Head: Normocephalic and atraumatic.  Eyes: Pupils are equal, round, and reactive to light.  Cardiovascular: Normal rate and regular rhythm.   Pulmonary/Chest: Effort normal and breath sounds normal.  Abdominal: Soft. Bowel sounds are normal.  Musculoskeletal: He exhibits edema.       BLE with evidence of statis changes and 1+ edema.   Neurological: He is alert and oriented to person, place, and time. No cranial nerve deficit. He exhibits normal muscle tone. Coordination abnormal.       BLE>BUE weakness. Hyper reflexia LE>UE with sensory changes distally. Strength 2/5 proximal in LE's to 3-4/5 distally. UE strength grossly intact. Decrease in fine motor movements bilateral UE.   Skin: Skin is warm and dry.  Psychiatric: He has a normal mood and affect. His behavior is normal. Judgment and thought content normal.    Results for orders placed during the hospital encounter of 10/25/12 (from the past 24 hour(s))  CBC     Status: Abnormal   Collection Time   10/28/12  3:56 AM      Component Value Range   WBC 9.4  4.0 - 10.5 K/uL   RBC 4.06 (*) 4.22 - 5.81 MIL/uL   Hemoglobin 12.6 (*) 13.0 - 17.0 g/dL   HCT 09.8 (*) 11.9 - 14.7 %   MCV 94.6  78.0 - 100.0 fL   MCH 31.0  26.0 - 34.0 pg   MCHC 32.8  30.0 - 36.0 g/dL   RDW 82.9  56.2 - 13.0 %   Platelets 150  150 - 400 K/uL  BASIC METABOLIC PANEL     Status:  Abnormal   Collection Time   10/28/12  3:56 AM      Component Value Range   Sodium 137  135 - 145 mEq/L   Potassium 3.5  3.5 - 5.1 mEq/L   Chloride 98  96 - 112 mEq/L   CO2 30  19 - 32 mEq/L   Glucose, Bld 119 (*) 70 - 99 mg/dL   BUN 16  6 - 23 mg/dL   Creatinine, Ser 8.65  0.50 - 1.35 mg/dL   Calcium 8.6  8.4 - 78.4 mg/dL   GFR calc non Af Amer 88 (*) >90 mL/min   GFR calc Af Amer >90  >90 mL/min   No results found.  Assessment/Plan: Diagnosis: deconditioning, lower extremity weakness related to urosepsis, ?lumbar stenosis/ morbidly obese 1. Does the need for close, 24 hr/day medical supervision in concert with the patient's rehab needs make it unreasonable for this patient to be served in a less intensive setting? Yes 2. Co-Morbidities requiring supervision/potential complications: UTI, pain mgt 3. Due to bladder management, bowel management, safety, skin/wound care, disease management, medication administration, pain management and patient education, does the patient require 24 hr/day rehab nursing? Yes 4. Does the patient require coordinated care of a physician, rehab nurse, PT (1-2 hrs/day, 5  days/week) and OT (1-2 hrs/day, 5 days/week) to address physical and functional deficits in the context of the above medical diagnosis(es)? Yes Addressing deficits in the following areas: balance, endurance, locomotion, strength, transferring, bowel/bladder control, bathing, dressing, feeding, grooming, toileting and psychosocial support 5. Can the patient actively participate in an intensive therapy program of at least 3 hrs of therapy per day at least 5 days per week? Yes 6. The potential for patient to make measurable gains while on inpatient rehab is excellent 7. Anticipated functional outcomes upon discharge from inpatient rehab are mod I to supervision with PT, mod I to minimal assist with OT, n/a with SLP. 8. Estimated rehab length of stay to reach the above functional goals is: 10-14  days 9. Does the patient have adequate social supports to accommodate these discharge functional goals? Yes 10. Anticipated D/C setting: Home 11. Anticipated post D/C treatments: HH therapy 12. Overall Rehab/Functional Prognosis: good  RECOMMENDATIONS: This patient's condition is appropriate for continued rehabilitative care in the following setting: CIR Patient has agreed to participate in recommended program. Yes Note that insurance prior authorization may be required for reimbursement for recommended care.  Comment: The patient would like to think about options, but seems to prefer CIR. Rehab RN to follow up.   Ivory Broad, MD     10/28/2012

## 2012-10-28 NOTE — Progress Notes (Signed)
Rehab Admissions Coordinator Note:  Patient was screened by Clois Dupes for appropriateness for an Inpatient Acute Rehab Consult. Rehab consult is pending completion today. Ann . Richardson Dopp will follow up today [phone].  Clois Dupes, RN 10/28/2012, 8:15 AM  I can be reached at [phone].

## 2012-10-29 DIAGNOSIS — W19XXXA Unspecified fall, initial encounter: Secondary | ICD-10-CM | POA: Diagnosis not present

## 2012-10-29 DIAGNOSIS — Y92009 Unspecified place in unspecified non-institutional (private) residence as the place of occurrence of the external cause: Secondary | ICD-10-CM | POA: Diagnosis not present

## 2012-10-29 DIAGNOSIS — R29898 Other symptoms and signs involving the musculoskeletal system: Secondary | ICD-10-CM | POA: Diagnosis not present

## 2012-10-29 DIAGNOSIS — R3981 Functional urinary incontinence: Secondary | ICD-10-CM | POA: Diagnosis not present

## 2012-10-29 NOTE — Progress Notes (Signed)
PT Cancellation Note  ___Treatment cancelled today due to medical issues with patient which prohibited therapy  ___ Treatment cancelled today due to patient receiving procedure or test   ___ Treatment cancelled today due to patient's refusal to participate   _X_ Treatment cancelled today due to multiple trips to [facility] for BM's  Felecia Shelling  PTA [facility]  Acute  Rehab Pager     [phone]

## 2012-10-29 NOTE — Progress Notes (Signed)
[facility] PROGRESS NOTE  Jerry [name] FAO:[id number] DOB: [birth year] DOA: 10/25/2012 PCP: No primary provider on file.  Brief narrative: 67 year old male with past medical history of morbid obesity, sleep apnea, lower extremity weakness who presented to ED status post fall at home and difficulty getting up. In ED, patient was found to have urinalysis with evidence of infection, white blood cell count of 16 with elevated creatinine at 1.7. Patient was started on ciprofloxacin for urinary tract infection. For lower extremity edema we have given one dose lasix and patient feels much better and swelling is improving.   Assessment and plan:  Principal problem  *Fall  Per PT - CIR recommended; at this time no beds available. CT head shows no acute intracranial findings  Patient is oriented to time, place and person   Active problems:  Lower extremity swelling  LE venous doppler shows no evidence of DVT  One dose lasix 40 mg IV given 10/25/2012 with some improvement in swelling  Obtained 2-D echo and proBNP - BNP only mildly elevated at slightly above 200; 2 D ECHO with EF 55%  Stopped IV fluids; encouraged oral intake Urinary tract infection  Secondary to Proteus mirabilis  Continue Rocephin Leukocytosis  Secondary to UTI due to proteus mirabilis  Continue rocephin IV - sensitive to ceftriaxone  Thrombocytopenia  D/Ced Lovenox and use SCD's bilaterally  Morbid obesity  Nutrition consult ordered   Code Status: full code  Family Communication: no family at bedside  Disposition Plan: Pending discharge plan, possible inpatient rehabilitation  [name], MD  [facility]  Pager [phone]   Consultants:  Wound care consult  Inpatient rehab consult for CIR- no bed available Procedures:  None  Antibiotics:  Rocephin 10/25/2012 -->  If 7PM-7AM, please contact night-coverage [email or web address] Password [email or web address] 10/29/2012, 1:52 PM   LOS: 4 days   HPI/Subjective: No acute overnight  events.  Objective: Filed Vitals:   10/28/12 0545 10/28/12 1448 10/28/12 2300 10/29/12 0435  BP: 131/62 128/66 117/50 124/69  Pulse: 63 73 63 66  Temp: 97.5 F (36.4 C) 97.9 F (36.6 C) 97.5 F (36.4 C) 98.4 F (36.9 C)  TempSrc: Oral  Oral Oral  Resp: 16 18 18 18   Height:      Weight:      SpO2: 95% 94% 98% 95%    Intake/Output Summary (Last 24 hours) at 10/29/12 1352 Last data filed at 10/29/12 0900  Gross per 24 hour  Intake   1250 ml  Output   1050 ml  Net    200 ml    Exam:   General:  Pt is alert, follows commands appropriately, not in acute distress  Cardiovascular: Regular rate and rhythm, S1/S2, no murmurs, no rubs, no gallops  Respiratory: Clear to auscultation bilaterally, no wheezing, no crackles, no rhonchi  Abdomen: Soft, non tender, non distended, bowel sounds present, no guarding  Extremities: Lower extremity edema improving, pulses DP and PT palpable bilaterally  Neuro: Grossly nonfocal  Data Reviewed: Basic Metabolic Panel:  Lab 10/28/12 8413 10/26/12 0401 10/25/12 0510 10/25/12 0147 10/25/12 0140  NA 137 136 -- 134* 132*  K 3.5 3.6 -- 3.9 3.7  CL 98 97 -- 102 94*  CO2 30 27 -- -- 25  GLUCOSE 119* 154* -- 147* 143*  BUN 16 17 -- 27* 26*  CREATININE 0.84 1.07 1.32 1.70* 1.44*  CALCIUM 8.6 8.7 -- -- 9.1  MG -- -- -- -- --  PHOS -- -- -- -- --   Liver Function Tests:  Lab 10/25/12 0140  AST 46*  ALT 22  ALKPHOS 62  BILITOT 0.9  PROT 7.1  ALBUMIN 3.0*   No results found for this basename: LIPASE:5,AMYLASE:5 in the last 168 hours No results found for this basename: AMMONIA:5 in the last 168 hours CBC:  Lab 10/28/12 0356 10/27/12 0402 10/26/12 0401 10/25/12 0510 10/25/12 0147 10/25/12 0140  WBC 9.4 10.3 11.2* 14.4* -- 16.6*  NEUTROABS -- -- -- -- -- --  HGB 12.6* 12.9* 13.4 13.0 14.6 --  HCT 38.4* 38.8* 40.8 39.3 43.0 --  MCV 94.6 94.2 95.1 93.3 -- 93.5  PLT 150 136* 128* 134* -- 154   Cardiac Enzymes: No results found for  this basename: CKTOTAL:5,CKMB:5,CKMBINDEX:5,TROPONINI:5 in the last 168 hours BNP: No components found with this basename: POCBNP:5 CBG: No results found for this basename: GLUCAP:5 in the last 168 hours  Recent Results (from the past 240 hour(s))  URINE CULTURE     Status: Normal   Collection Time   10/25/12  1:22 AM      Component Value Range Status Comment   Specimen Description URINE, CATHETERIZED   Final    Special Requests NONE   Final    Culture  Setup Time 10/25/2012 08:56   Final    Colony Count >=100,000 COLONIES/ML   Final    Culture PROTEUS MIRABILIS   Final    Report Status 10/27/2012 FINAL   Final    Organism ID, Bacteria PROTEUS MIRABILIS   Final   RAPID STREP SCREEN     Status: Normal   Collection Time   10/25/12  1:57 AM      Component Value Range Status Comment   Streptococcus, Group A Screen (Direct) NEGATIVE  NEGATIVE Final   STREP A DNA PROBE     Status: Normal   Collection Time   10/25/12  1:57 AM      Component Value Range Status Comment   Specimen Description THROAT   Final    Special Requests Normal   Final    Group A Strep Probe NEGATIVE   Final    Report Status 10/26/2012 FINAL   Final      Studies: No results found.  Scheduled Meds:   . antiseptic oral rinse  15 mL Mouth Rinse q12n4p  . cefTRIAXone (ROCEPHIN)  IV  1 g Intravenous QHS  . chlorhexidine  15 mL Mouth Rinse BID  . docusate sodium  100 mg Oral BID  . polyethylene glycol  17 g Oral Daily

## 2012-10-29 NOTE — Progress Notes (Signed)
Removed foley. Pt had small void on floor. Inserted new foley, 1200 ml of urine immediately emptied from bladder. Pt felt relief of bladder pain.

## 2012-10-29 NOTE — Progress Notes (Addendum)
Pt unable to do therapy today d/t B/B issues.   Will need to f/up about this & about CIR bed availability.  Currently no bed available CIR.  [phone]

## 2012-10-29 NOTE — Progress Notes (Addendum)
Clinical Social Worker met with pt at bedside to discuss CIR vs SNF. Pt still expressing interest in [facility] and would elect to go to that facility if they were able to offer a bed. Clinical Social Worker discussed that facility did not make a bed offer and pt asked Clinical Social Worker to re-visit with facility about bed offers. Clinical Child psychotherapist spoke with facility and sent updated clinicals and [facility] was still unable to offer a bed. Clinical Social Worker met with pt again to notify, RNCM present at this time. Pt stated that he recognizes [facility] is unable to offer and is highly motivated for [facility]. Pt reports that CIR RN visited pt this morning. RNCM and Clinical Social Worker contacted [employer] RN, [place] [facility] to discuss. CIR RN has to discuss with inpatient rehab to determine bed availability and will notify this Clinical Social Worker and [facility] when that is determined. Clinical Social Worker to continue to follow.  Addendum 3:00pm: Clinical Child psychotherapist received notification from [employer] RN, [facility] that CIR does not have bed availability for today and CIR would re-visit pt tomorrow. Clinical Social Worker met with pt at bedside to notify. Clinical Social Worker encouraged pt to have a secondary plan in mind if CIR is unable to accept. Clinical Social Worker encouraged consideration of other SNF facilities as secondary option to CIR, but pt refusing stating that if CIR cannot accept pt then he would want to return home with private duty care. Pt continue to be motivated for CIR and is hopeful that a bed will become available. Clinical Social Worker to continue to follow and assist as appropriate.  Jacklynn Lewis, MSW, LCSWA  Clinical Social Work [phone]

## 2012-10-29 NOTE — Progress Notes (Signed)
Pt's foley has not been emptied since 2300, less than currently in bag. Report given that foley was kinked overnight. Pt feels bladder is full and feels bloated from constipation. Will bladder scan pt and continue to monitor.

## 2012-10-30 DIAGNOSIS — W19XXXA Unspecified fall, initial encounter: Secondary | ICD-10-CM | POA: Diagnosis not present

## 2012-10-30 DIAGNOSIS — R3981 Functional urinary incontinence: Secondary | ICD-10-CM | POA: Diagnosis not present

## 2012-10-30 DIAGNOSIS — Y92009 Unspecified place in unspecified non-institutional (private) residence as the place of occurrence of the external cause: Secondary | ICD-10-CM | POA: Diagnosis not present

## 2012-10-30 DIAGNOSIS — R29898 Other symptoms and signs involving the musculoskeletal system: Secondary | ICD-10-CM | POA: Diagnosis not present

## 2012-10-30 MED ORDER — POLYETHYLENE GLYCOL 3350 17 G PO PACK: 17.0000 g | PACK | Freq: Every day | ORAL | Status: DC

## 2012-10-30 MED ORDER — BIOTENE DRY MOUTH MT LIQD: 15.0000 mL | Freq: Two times a day (BID) | OROMUCOSAL | Status: DC

## 2012-10-30 MED ORDER — HYDROMORPHONE HCL 2 MG PO TABS
2.0000 mg | ORAL_TABLET | ORAL | Status: DC | PRN
  Administered 2012-10-30: 2 mg via ORAL
  Filled 2012-10-30: qty 1

## 2012-10-30 MED ORDER — HYDROMORPHONE HCL 2 MG PO TABS: 2.0000 mg | ORAL_TABLET | ORAL | Status: DC | PRN

## 2012-10-30 MED ORDER — DSS 100 MG PO CAPS: 100.0000 mg | ORAL_CAPSULE | Freq: Two times a day (BID) | ORAL | Status: DC

## 2012-10-30 NOTE — Progress Notes (Signed)
Clinical Social Worker received notification from from [employer] RN, [place] [facility] that no bed availability at [facility] and pt agreeing to SNF. Clinical Social Worker met with pt at bedside. Pt ex wife, Darl Pikes is present today at bedside. Clinical Social Worker discussed SNF bed offers that were previously provided to pt on 10/28/12. Clinical Social Worker clarified questions, contacted facilities at pt request and bed offers have not changed. Pt and pt ex wife, Darl Pikes would not make a decision on SNF placement. Pt agreeable to expanded search to [place], [place], and [place]. Clinical Social Worker, RNCM, and MD have discussed with pt that a decision has to be made by tomorrow as he will be discharged tomorrow. Clinical Social Worker to follow up in AM.  Jacklynn Lewis, MSW, LCSWA  Clinical Social Work [phone]

## 2012-10-30 NOTE — Progress Notes (Addendum)
[facility] PROGRESS NOTE  Jerry [name] JXB:[id number] DOB: Jun 04, 1946 DOA: 10/25/2012 PCP: No primary provider on file.  Brief narrative: 67 year old male with past medical history of morbid obesity, sleep apnea, lower extremity weakness who presented to ED status post fall at home and difficulty getting up. In ED, patient was found to have urinalysis with evidence of infection, white blood cell count of 16 with elevated creatinine at 1.7. Patient was started on ciprofloxacin for urinary tract infection. For lower extremity edema we have given one dose lasix and patient feels much better and swelling is improving. At this time we are waiting final decision in regards to discharge him to inpatient rehabilitation.  Assessment and plan:   Principal problem  *Fall  Per PT - CIR recommended; at this time no beds available.  CT head shows no acute intracranial findings  Patient is oriented to time, place and person   Active problems:  Lower extremity swelling  LE venous doppler shows no evidence of DVT  One dose lasix 40 mg IV given 10/25/2012 with some improvement in swelling  Obtained 2-D echo and proBNP - BNP only mildly elevated at slightly above 200; 2 D ECHO with EF 55%  Patient is not on IV fluids anymore. Urinary tract infection  Secondary to Proteus mirabilis  Continue Rocephin Leukocytosis  Secondary to UTI due to proteus mirabilis  Continue rocephin IV - sensitive to ceftriaxone  Thrombocytopenia  Secondary to Lovenox D/Ced Lovenox and use SCD's bilaterally  Morbid obesity  Nutrition consult ordered  Code Status: full code  Family Communication: no family at bedside  Disposition Plan: Pending discharge plan, possible inpatient rehabilitation   [name], MD  [facility]  Pager [phone]  Consultants:  Wound care consult  Inpatient rehab consult for CIR- no bed available Procedures:  None  Antibiotics:  Rocephin 10/25/2012 -->  If 7PM-7AM, please contact  night-coverage [email or web address] Password [email or web address] 10/30/2012, 1:50 PM   LOS: 5 days   HPI/Subjective: No acute overnight events.  Objective: Filed Vitals:   10/28/12 2300 10/29/12 0435 10/29/12 2121 10/30/12 0608  BP: 117/50 124/69 118/53 110/56  Pulse: 63 66 68 68  Temp: 97.5 F (36.4 C) 98.4 F (36.9 C) 97.5 F (36.4 C) 97.9 F (36.6 C)  TempSrc: Oral Oral Oral Oral  Resp: 18 18 18 18   Height:      Weight:      SpO2: 98% 95% 97% 94%    Intake/Output Summary (Last 24 hours) at 10/30/12 1350 Last data filed at 10/30/12 0608  Gross per 24 hour  Intake      0 ml  Output   2950 ml  Net  -2950 ml    Exam:   General:  Pt is alert, follows commands appropriately, not in acute distress  Cardiovascular: Regular rate and rhythm, S1/S2, no murmurs, no rubs, no gallops  Respiratory: Clear to auscultation bilaterally, no wheezing, no crackles, no rhonchi  Abdomen: Soft, non tender, non distended, bowel sounds present, no guarding  Extremities: Lower extremity edema improving, pulses DP and PT palpable bilaterally  Neuro: Grossly nonfocal  Data Reviewed: Basic Metabolic Panel:  Lab 10/28/12 8469 10/26/12 0401 10/25/12 0147 10/25/12 0140  NA 137 136 134* 132*  K 3.5 3.6 3.9 3.7  CL 98 97 102 94*  CO2 30 27 -- 25  GLUCOSE 119* 154* 147* 143*  BUN 16 17 27* 26*  CREATININE 0.84 1.07 1.70* 1.44*  CALCIUM 8.6 8.7 -- 9.1   Liver Function Tests:  Lab 10/25/12  0140  AST 46*  ALT 22  ALKPHOS 62  BILITOT 0.9  PROT 7.1  ALBUMIN 3.0*   No results found for this basename: LIPASE:5,AMYLASE:5 in the last 168 hours No results found for this basename: AMMONIA:5 in the last 168 hours CBC:  Lab 10/28/12 0356 10/27/12 0402 10/26/12 0401 10/25/12 0510 10/25/12 0147 10/25/12 0140  WBC 9.4 10.3 11.2* 14.4* -- 16.6*  NEUTROABS -- -- -- -- -- --  HGB 12.6* 12.9* 13.4 13.0 14.6 --  HCT 38.4* 38.8* 40.8 39.3 43.0 --  MCV 94.6 94.2 95.1 93.3 -- 93.5  PLT 150 136* 128* 134* -- 154    Cardiac Enzymes: No results found for this basename: CKTOTAL:5,CKMB:5,CKMBINDEX:5,TROPONINI:5 in the last 168 hours BNP: No components found with this basename: POCBNP:5 CBG: No results found for this basename: GLUCAP:5 in the last 168 hours  Recent Results (from the past 240 hour(s))  URINE CULTURE     Status: Normal   Collection Time   10/25/12  1:22 AM      Component Value Range Status Comment   Specimen Description URINE, CATHETERIZED   Final    Special Requests NONE   Final    Culture  Setup Time 10/25/2012 08:56   Final    Colony Count >=100,000 COLONIES/ML   Final    Culture PROTEUS MIRABILIS   Final    Report Status 10/27/2012 FINAL   Final    Organism ID, Bacteria PROTEUS MIRABILIS   Final   RAPID STREP SCREEN     Status: Normal   Collection Time   10/25/12  1:57 AM      Component Value Range Status Comment   Streptococcus, Group A Screen (Direct) NEGATIVE  NEGATIVE Final   STREP A DNA PROBE     Status: Normal   Collection Time   10/25/12  1:57 AM      Component Value Range Status Comment   Specimen Description THROAT   Final    Special Requests Normal   Final    Group A Strep Probe NEGATIVE   Final    Report Status 10/26/2012 FINAL   Final      Studies: No results found.  Scheduled Meds:   . antiseptic oral rinse  15 mL Mouth Rinse q12n4p  . cefTRIAXone (ROCEPHIN)  IV  1 g Intravenous QHS  . chlorhexidine  15 mL Mouth Rinse BID  . docusate sodium  100 mg Oral BID  . polyethylene glycol  17 g Oral Daily   Continuous Infusions:

## 2012-10-30 NOTE — Progress Notes (Signed)
Physical Therapy Treatment Patient Details Name: Jerry [name] MRN: [id number] DOB: [birth year] Today's Date: 10/30/2012 Time: 4098-1191 PT Time Calculation (min): 25 min  PT Assessment / Plan / Recommendation Comments on Treatment Session  Pt very motivated and progressing.  States he wants to get back to walking like he did when he would go to [employer] and fill a whole cart.  Believe pt would progress well at [facility] at [facility] if approved.    Follow Up Recommendations  CIR     Does the patient have the potential to tolerate intense rehabilitation     Barriers to Discharge        Equipment Recommendations  None recommended by PT    Recommendations for Other Services    Frequency Min 3X/week   Plan Discharge plan remains appropriate    Precautions / Restrictions Precautions Precautions: Fall Restrictions Weight Bearing Restrictions: No   Pertinent Vitals/Pain No c/o pain    Mobility  Bed Mobility Bed Mobility: Not assessed Details for Bed Mobility Assistance: Pt OOB in recliner Transfers Transfers: Sit to Stand;Stand to Sit Sit to Stand: 3: Mod assist;2: Max assist;From chair/3-in-1 Stand to Sit: 3: Mod assist;2: Max assist;To chair/3-in-1 Details for Transfer Assistance: increased time and much effort to keep from falling backward during sit to stand.  Uncontrolled desend with stand to sit as pt fatigues. Ambulation/Gait Ambulation/Gait Assistance: 3: Mod assist Ambulation Distance (Feet): 65 Feet (45' then another 27' with one sitting rest break and 2 stand) Assistive device: Rolling walker (bariatric RW) Ambulation/Gait Assistance Details: Amb on RA sats avg 88-90% with 50% VC's on deep breathing.  Pt required several rest breaks during gait and demon 3/4 DOE. Gait Pattern: Step-through pattern;Decreased stride length Gait velocity: decreased     PT Goals    Visit Information  Last PT Received On: 10/30/12 Assistance Needed: +1    Subjective Data  Subjective: I'm ready   Cognition    good   Balance   fair   End of Session PT - End of Session Equipment Utilized During Treatment: Gait belt Activity Tolerance: Patient limited by fatigue Patient left: in chair;with call bell/phone within reach

## 2012-10-30 NOTE — Progress Notes (Signed)
Met w/ pt & his ex wife, Darl Pikes, to inform them about no bed available currently on CIR.  Pt states he will d/c SNF as alternative.  His SW, Suzanna, notified so she can begin bed search.  I will continue to follow just in case a CIR bed opens up before he d/cs SNF.  [phone]

## 2012-10-30 NOTE — Plan of Care (Signed)
Problem: Phase I Progression Outcomes Goal: Voiding-avoid urinary catheter unless indicated Outcome: Not Progressing Pt has required foley for pressure ulcer healing

## 2012-10-31 DIAGNOSIS — R29898 Other symptoms and signs involving the musculoskeletal system: Secondary | ICD-10-CM | POA: Diagnosis not present

## 2012-10-31 DIAGNOSIS — R3981 Functional urinary incontinence: Secondary | ICD-10-CM | POA: Diagnosis not present

## 2012-10-31 DIAGNOSIS — Y92009 Unspecified place in unspecified non-institutional (private) residence as the place of occurrence of the external cause: Secondary | ICD-10-CM | POA: Diagnosis not present

## 2012-10-31 DIAGNOSIS — W19XXXA Unspecified fall, initial encounter: Secondary | ICD-10-CM | POA: Diagnosis not present

## 2012-10-31 MED ORDER — ALPRAZOLAM 0.5 MG PO TABS: 0.5000 mg | ORAL_TABLET | Freq: Two times a day (BID) | ORAL | Status: DC | PRN

## 2012-10-31 MED ORDER — ALPRAZOLAM 0.5 MG PO TABS
0.5000 mg | ORAL_TABLET | Freq: Two times a day (BID) | ORAL | Status: DC | PRN
  Administered 2012-10-31 – 2012-11-01 (×3): 0.5 mg via ORAL
  Filled 2012-10-31 (×4): qty 1

## 2012-10-31 NOTE — Progress Notes (Signed)
Rehab admissions - i spoke with patient's case manager today.  Plans are for admit to [facility] tomorrow.  Call me if there are any changes in this plan.  #[phone]

## 2012-10-31 NOTE — Progress Notes (Signed)
Physical Therapy Treatment Patient Details Name: Edis Huish MRN: [id number] DOB: September 14, 1946 Today's Date: 10/31/2012 Time: 0865-7846 PT Time Calculation (min): 18 min  PT Assessment / Plan / Recommendation Comments on Treatment Session  Pt continues steady progress.  Treatment today was focused on standing balance, core strengthening and activity endurance.  Pt is ready for inpatient rehab at [facility] tomorrow    Follow Up Recommendations  CIR     Does the patient have the potential to tolerate intense rehabilitation     Barriers to Discharge        Equipment Recommendations  None recommended by PT    Recommendations for Other Services    Frequency Min 3X/week   Plan Discharge plan remains appropriate    Precautions / Restrictions Precautions Precautions: Fall Restrictions Weight Bearing Restrictions: No   Pertinent Vitals/Pain No c/o pain    Mobility  Bed Mobility Bed Mobility: Not assessed Details for Bed Mobility Assistance: Pt OOB in recliner Transfers Transfers: Sit to Stand;Stand to Sit Sit to Stand: From chair/3-in-1;4: Min assist Stand to Sit: To chair/3-in-1;4: Min assist Details for Transfer Assistance: pt takes extra time especially when in partial stand  and transitioning from bringing hands from chair up to armrests of RW  Ambulation/Gait Ambulation/Gait Assistance: 3: Mod assist Assistive device: Rolling walker (bariatric RW) Gait Pattern: Step-through pattern;Decreased stride length Gait velocity: decreased    Exercises Other Exercises Other Exercises: deep breathing  Other Exercises: abdominal sets Other Exercises: sitting unsupported with bilateral hip to hip with trunk extended to activate core muscles Other Exercises: standing weight shift and stepping while doing standing balance tolerance 3 minues x 2 trials Other Exercises: heel raises in sitting   PT Diagnosis:    PT Problem List:   PT Treatment Interventions:     PT Goals Acute  Rehab PT Goals PT Goal Formulation: With patient Time For Goal Achievement: 11/09/12 Potential to Achieve Goals: Good Pt will go Supine/Side to Sit: with min assist Pt will go Sit to Supine/Side: with min assist Pt will go Sit to Stand: with modified independence PT Goal: Sit to Stand - Progress: Progressing toward goal Pt will go Stand to Sit: with modified independence PT Goal: Stand to Sit - Progress: Progressing toward goal Pt will Stand: with modified independence;6 - 10 min;with bilateral upper extremity support PT Goal: Stand - Progress: Progressing toward goal Pt will Ambulate: 51 - 150 feet;with min assist;with least restrictive assistive device  Visit Information  Last PT Received On: 10/31/12 Assistance Needed: +1    Subjective Data  Subjective: "I'm going to [employer] tomorrow" Patient Stated Goal: to get stronger   Cognition  Overall Cognitive Status: Appears within functional limits for tasks assessed/performed Arousal/Alertness: Awake/alert Orientation Level: Appears intact for tasks assessed Behavior During Session: [facility] for tasks performed    Balance  Balance Balance Assessed: Yes Static Sitting Balance Static Sitting - Balance Support: Feet supported;No upper extremity supported Static Sitting - Level of Assistance: 5: Stand by assistance Static Standing Balance Static Standing - Balance Support: Bilateral upper extremity supported;During functional activity Static Standing - Level of Assistance: 6: Modified independent (Device/Increase time)  End of Session PT - End of Session Activity Tolerance: Patient limited by fatigue Patient left: in chair;with call bell/phone within reach   GP     Rosey Bath K. [place], [place] [phone] 10/31/2012, 3:18 PM

## 2012-10-31 NOTE — Discharge Summary (Addendum)
Physician Discharge Summary  Rushi Chasen EAV:[id number] DOB: 1946-09-08 DOA: 10/25/2012  PCP: No primary provider on file.  Admit date: 10/25/2012 Discharge date: 10/31/2012  Recommendations for Outpatient Follow-up:  1. Patient will go to SNF / short term rehab. Patient to go with foley due to bladder distention and urinary retention. Please evaluate for voiding trial in about 1 week post discharge. 2. Wound care for sacrum: Barrier cream to promote healing, keep turned off affected area.    Discharge Diagnoses:  Principal Problem:  *Fall at home Active Problems:  Urinary incontinence, functional  Bilateral leg weakness  UTI (lower urinary tract infection)  Sleep apnea  Medically noncompliant  Obesity  Discharge Condition: medically stable and clinically appears very well for discharge  Diet recommendation: as tolerated  History of present illness:  67 year old male with past medical history of morbid obesity, sleep apnea, lower extremity weakness who presented to ED status post fall at home and difficulty getting up. In ED, patient was found to have urinalysis with evidence of infection, white blood cell count of 16 with elevated creatinine at 1.7. Patient was started on rocephin for urinary tract infection. For lower extremity edema we have given one dose lasix and patient feels much better and swelling is improving.  Patient was evaluated by PT which initially recommended CIR. There was no no bed available so patient will be discharged to another SNF.   Assessment and plan:   Principal problem  *Fall  Per PT - SNF in am CT head shows no acute intracranial findings  Patient is oriented to time, place and person  Active problems:  Lower extremity swelling  LE venous doppler shows no evidence of DVT  One dose lasix 40 mg IV given 10/25/2012 with some improvement in swelling  Obtained 2-D echo and proBNP - BNP only mildly elevated at slightly above 200; 2 D ECHO with EF 55%    Patient is not on IV fluids anymore. Urinary tract infection  Secondary to Proteus mirabilis  Patient has completed appropriate course of rocephin for UTI. Antibiotics discontinued 10/30/2012 Leukocytosis  Secondary to UTI due to proteus mirabilis   Thrombocytopenia  Secondary to Lovenox  D/Ced Lovenox and use SCD's bilaterally  Morbid obesity  Nutrition consult ordered    Code Status: full code  Family Communication: no family at bedside    [name], MD  [facility]  Pager [phone]   Consultants:  Wound care consult for sacral pressure wound Inpatient rehab consult for CIR- no bed available Procedures:  None  Antibiotics:  Rocephin 10/25/2012 --> 10/30/2012  Discharge Exam: Filed Vitals:   10/31/12 1354  BP: 115/56  Pulse: 78  Temp: 97.9 F (36.6 C)  Resp: 18   Filed Vitals:   10/30/12 1400 10/30/12 2115 10/31/12 0700 10/31/12 1354  BP: 136/70 128/62 123/53 115/56  Pulse: 70 73 67 78  Temp: 97.5 F (36.4 C) 97.9 F (36.6 C) 97.6 F (36.4 C) 97.9 F (36.6 C)  TempSrc: Oral Oral Oral Oral  Resp: 18 18 18 18   Height:      Weight:      SpO2: 94% 93% 92% 97%    General: Pt is alert, follows commands appropriately, not in acute distress Cardiovascular: Regular rate and rhythm, S1/S2 +, no murmurs, no rubs, no gallops Respiratory: Clear to auscultation bilaterally, no wheezing, no crackles, no rhonchi Abdominal: Soft, non tender, non distended, bowel sounds +, no guarding Extremities: improving LE edema, no cyanosis, pulses palpable bilaterally DP and PT Neuro:  Grossly nonfocal  Discharge Instructions  Discharge Orders    Future Orders Please Complete By Expires   Diet - low sodium heart healthy      Increase activity slowly      Call MD for:  persistant nausea and vomiting      Call MD for:  severe uncontrolled pain      Call MD for:  difficulty breathing, headache or visual disturbances      Call MD for:  persistant dizziness or light-headedness           Medication List     As of 10/31/2012  7:23 PM    TAKE these medications         ALPRAZolam 0.5 MG tablet   Commonly known as: XANAX   Take 1 tablet (0.5 mg total) by mouth 2 (two) times daily as needed for anxiety.      antiseptic oral rinse Liqd   15 mLs by Mouth Rinse route 2 times daily at 12 noon and 4 pm.      DSS 100 MG Caps   Take 100 mg by mouth 2 (two) times daily.      HYDROmorphone 2 MG tablet   Commonly known as: DILAUDID   Take 1 tablet (2 mg total) by mouth every 3 (three) hours as needed.      polyethylene glycol packet   Commonly known as: MIRALAX / GLYCOLAX   Take 17 g by mouth daily.          The results of significant diagnostics from this hospitalization (including imaging, microbiology, ancillary and laboratory) are listed below for reference.    Significant Diagnostic Studies: Ct Head Wo Contrast  10/25/2012  *RADIOLOGY REPORT*  Clinical Data: Fall.  CT HEAD WITHOUT CONTRAST  Technique:  Contiguous axial images were obtained from the base of the skull through the vertex without contrast.  Comparison: None.  Findings: No skull fracture or intracranial hemorrhage.  Mild atrophy without hydrocephalus.  No CT evidence of large acute infarct.  No intracranial mass lesion detected on this unenhanced exam.  Vascular calcifications.  Orbital structures unremarkable.  Mastoid air cells, middle ear cavities and visualized paranasal sinuses are clear.  IMPRESSION: No skull fracture or intracranial hemorrhage.   Original Report Authenticated By: Lacy Duverney, M.D.    Dg Chest Portable 1 View  10/25/2012  *RADIOLOGY REPORT*  Clinical Data: Fever for 4 days.  PORTABLE CHEST - 1 VIEW  Comparison: Single view of the chest 05/25/2007.  Findings: Lungs are clear.  Heart size normal.  No pneumothorax or pleural effusion.  Elevation of the right hemidiaphragm noted.  IMPRESSION: No acute abnormality.   Original Report Authenticated By: Holley Dexter, M.D.      Microbiology: Recent Results (from the past 240 hour(s))  URINE CULTURE     Status: Normal   Collection Time   10/25/12  1:22 AM      Component Value Range Status Comment   Specimen Description URINE, CATHETERIZED   Final    Special Requests NONE   Final    Culture  Setup Time 10/25/2012 08:56   Final    Colony Count >=100,000 COLONIES/ML   Final    Culture PROTEUS MIRABILIS   Final    Report Status 10/27/2012 FINAL   Final    Organism ID, Bacteria PROTEUS MIRABILIS   Final   RAPID STREP SCREEN     Status: Normal   Collection Time   10/25/12  1:57 AM  Component Value Range Status Comment   Streptococcus, Group A Screen (Direct) NEGATIVE  NEGATIVE Final   STREP A DNA PROBE     Status: Normal   Collection Time   10/25/12  1:57 AM      Component Value Range Status Comment   Specimen Description THROAT   Final    Special Requests Normal   Final    Group A Strep Probe NEGATIVE   Final    Report Status 10/26/2012 FINAL   Final      Labs: Basic Metabolic Panel:  Lab 10/28/12 1610 10/26/12 0401 10/25/12 0510 10/25/12 0147 10/25/12 0140  NA 137 136 -- 134* 132*  K 3.5 3.6 -- 3.9 3.7  CL 98 97 -- 102 94*  CO2 30 27 -- -- 25  GLUCOSE 119* 154* -- 147* 143*  BUN 16 17 -- 27* 26*  CREATININE 0.84 1.07 1.32 1.70* 1.44*  CALCIUM 8.6 8.7 -- -- 9.1  MG -- -- -- -- --  PHOS -- -- -- -- --   Liver Function Tests:  Lab 10/25/12 0140  AST 46*  ALT 22  ALKPHOS 62  BILITOT 0.9  PROT 7.1  ALBUMIN 3.0*   No results found for this basename: LIPASE:5,AMYLASE:5 in the last 168 hours No results found for this basename: AMMONIA:5 in the last 168 hours CBC:  Lab 10/28/12 0356 10/27/12 0402 10/26/12 0401 10/25/12 0510 10/25/12 0147 10/25/12 0140  WBC 9.4 10.3 11.2* 14.4* -- 16.6*  NEUTROABS -- -- -- -- -- --  HGB 12.6* 12.9* 13.4 13.0 14.6 --  HCT 38.4* 38.8* 40.8 39.3 43.0 --  MCV 94.6 94.2 95.1 93.3 -- 93.5  PLT 150 136* 128* 134* -- 154   Cardiac Enzymes: No results found  for this basename: CKTOTAL:5,CKMB:5,CKMBINDEX:5,TROPONINI:5 in the last 168 hours BNP: BNP (last 3 results)  Basename 10/25/12 0510  PROBNP 201.8*   CBG: No results found for this basename: GLUCAP:5 in the last 168 hours  Time coordinating discharge: Over 30 minutes  Signed:  [name], MD  [facility] 10/31/2012, 7:23 PM  Pager #: [phone]

## 2012-10-31 NOTE — Progress Notes (Signed)
Pt accepted by Inpatient Rehab in [facility], bed available Friday 11/01/2012. Pt and pt ex-wife at bedside notified. RNCM to assist with transfer via [facility]. No further social work needs identified at this time. Clinical Social Worker signing off.   Jacklynn Lewis, MSW, LCSWA  Clinical Social Work [phone]

## 2012-10-31 NOTE — Progress Notes (Signed)
Phone call received from Darl Pikes pt's ex-wife to obtain information on pt's condition.  Explained to Darl Pikes that do to HIPPA I am unable to give any information out over the phone.  After much debate Darl Pikes accepted that information could not be given out.  Darl Pikes did ask that this RN make note that the pt was very depressed this afternoon because he was under the impression that he was being denied beds at [facility] d/t his weight.  The pt did NOT relay any of this to me, in fact he verbalized having a good day.  The only concern pt voiced to this RN was not being evaluated by a urologist this admission to determine why he is having urinary retention.

## 2012-10-31 NOTE — Progress Notes (Signed)
10/31/12 MPearson, RN, BSN Pt was offered a bed at [facility] for 11/02/12.  Pt accepted and will transfer in am.  10/30/12 MPearson, RN, BSN CIR do not have a bed for pt at present time. Pt agreed to fax his information to [facility]. Information was faxed [phone]. [facility] will call in am 10/31/12, if can they offer a bed for the pt. Pt is aware.

## 2012-11-01 DIAGNOSIS — N39490 Overflow incontinence: Secondary | ICD-10-CM | POA: Diagnosis not present

## 2012-11-01 DIAGNOSIS — S8010XA Contusion of unspecified lower leg, initial encounter: Secondary | ICD-10-CM | POA: Diagnosis not present

## 2012-11-01 DIAGNOSIS — IMO0002 Reserved for concepts with insufficient information to code with codable children: Secondary | ICD-10-CM | POA: Diagnosis not present

## 2012-11-01 DIAGNOSIS — R3981 Functional urinary incontinence: Secondary | ICD-10-CM | POA: Diagnosis not present

## 2012-11-01 DIAGNOSIS — K5900 Constipation, unspecified: Secondary | ICD-10-CM | POA: Diagnosis not present

## 2012-11-01 DIAGNOSIS — L8991 Pressure ulcer of unspecified site, stage 1: Secondary | ICD-10-CM | POA: Diagnosis not present

## 2012-11-01 DIAGNOSIS — S79929A Unspecified injury of unspecified thigh, initial encounter: Secondary | ICD-10-CM | POA: Diagnosis not present

## 2012-11-01 DIAGNOSIS — F411 Generalized anxiety disorder: Secondary | ICD-10-CM | POA: Diagnosis not present

## 2012-11-01 DIAGNOSIS — B952 Enterococcus as the cause of diseases classified elsewhere: Secondary | ICD-10-CM | POA: Diagnosis not present

## 2012-11-01 DIAGNOSIS — Z6841 Body Mass Index (BMI) 40.0 and over, adult: Secondary | ICD-10-CM | POA: Diagnosis not present

## 2012-11-01 DIAGNOSIS — M159 Polyosteoarthritis, unspecified: Secondary | ICD-10-CM | POA: Diagnosis not present

## 2012-11-01 DIAGNOSIS — M1710 Unilateral primary osteoarthritis, unspecified knee: Secondary | ICD-10-CM | POA: Diagnosis not present

## 2012-11-01 DIAGNOSIS — Z22322 Carrier or suspected carrier of Methicillin resistant Staphylococcus aureus: Secondary | ICD-10-CM | POA: Diagnosis not present

## 2012-11-01 DIAGNOSIS — S8990XA Unspecified injury of unspecified lower leg, initial encounter: Secondary | ICD-10-CM | POA: Diagnosis not present

## 2012-11-01 DIAGNOSIS — R5381 Other malaise: Secondary | ICD-10-CM | POA: Diagnosis not present

## 2012-11-01 DIAGNOSIS — G4730 Sleep apnea, unspecified: Secondary | ICD-10-CM | POA: Diagnosis not present

## 2012-11-01 DIAGNOSIS — M25569 Pain in unspecified knee: Secondary | ICD-10-CM | POA: Diagnosis not present

## 2012-11-01 DIAGNOSIS — Z9119 Patient's noncompliance with other medical treatment and regimen: Secondary | ICD-10-CM | POA: Diagnosis not present

## 2012-11-01 DIAGNOSIS — S99929A Unspecified injury of unspecified foot, initial encounter: Secondary | ICD-10-CM | POA: Diagnosis not present

## 2012-11-01 DIAGNOSIS — Z87440 Personal history of urinary (tract) infections: Secondary | ICD-10-CM | POA: Diagnosis not present

## 2012-11-01 DIAGNOSIS — R338 Other retention of urine: Secondary | ICD-10-CM | POA: Diagnosis not present

## 2012-11-01 DIAGNOSIS — N390 Urinary tract infection, site not specified: Secondary | ICD-10-CM | POA: Diagnosis not present

## 2012-11-01 DIAGNOSIS — S79919A Unspecified injury of unspecified hip, initial encounter: Secondary | ICD-10-CM | POA: Diagnosis not present

## 2012-11-01 DIAGNOSIS — L740 Miliaria rubra: Secondary | ICD-10-CM | POA: Diagnosis not present

## 2012-11-01 DIAGNOSIS — R609 Edema, unspecified: Secondary | ICD-10-CM | POA: Diagnosis not present

## 2012-11-01 DIAGNOSIS — L89309 Pressure ulcer of unspecified buttock, unspecified stage: Secondary | ICD-10-CM | POA: Diagnosis not present

## 2012-11-01 DIAGNOSIS — M25579 Pain in unspecified ankle and joints of unspecified foot: Secondary | ICD-10-CM | POA: Diagnosis not present

## 2012-11-01 DIAGNOSIS — S82109A Unspecified fracture of upper end of unspecified tibia, initial encounter for closed fracture: Secondary | ICD-10-CM | POA: Diagnosis not present

## 2012-11-01 DIAGNOSIS — Z5189 Encounter for other specified aftercare: Secondary | ICD-10-CM | POA: Diagnosis not present

## 2012-11-01 DIAGNOSIS — L89109 Pressure ulcer of unspecified part of back, unspecified stage: Secondary | ICD-10-CM | POA: Diagnosis not present

## 2012-11-01 DIAGNOSIS — M1610 Unilateral primary osteoarthritis, unspecified hip: Secondary | ICD-10-CM | POA: Diagnosis not present

## 2012-11-01 NOTE — Progress Notes (Signed)
[facility] PROGRESS NOTE  Jerry [name] WJX:[id number] DOB: [birth year] DOA: 10/25/2012 PCP: No primary provider on file.  Brief narrative: 67 year old male with PMH of morbid obesity, sleep apnea, lower extremity weakness who presented to ED status post fall at home and difficulty getting up. In ED, patient was found to have urinalysis with evidence of infection, white blood cell count of 16 with elevated creatinine at 1.7. Patient was started on rocephin for urinary tract infection. Patient was evaluated by PT which initially recommended CIR. There was no no bed available so patient will be discharged to another SNF.   Assessment/Plan: Principal problem  *Fall   Per PT - SNF today.   CT head showed no acute intracranial findings.   Patient remains oriented to time, place and person.  Active problems:  Lower extremity swelling   LE venous doppler done 10/25/2012 showed no evidence of DVT.  One dose lasix 40 mg IV given 10/25/2012 with some improvement in swelling.   Obtained 2-D echo and proBNP - BNP only mildly elevated at slightly above 200; 2 D ECHO with EF 55%, so CHF not felt to be likely. Urinary tract infection   Secondary to Proteus mirabilis.   Patient has completed appropriate course of rocephin for UTI. Antibiotics discontinued 10/30/2012. Leukocytosis   Secondary to UTI due to proteus mirabilis.  Thrombocytopenia   Possibly secondary to Lovenox.   D/Ced Lovenox (SCD's bilaterally used instead for DVT prophylaxis). Morbid obesity   Recommend weight loss counseling as an outpatient.  Code Status: Full. Family Communication: None at bedside. Disposition Plan: To inpatient rehab today.   Medical Consultants:  Dr. Faith Rogue, CIR  Other Consultants:  Physical therapy  Anti-infectives:  Rocephin 10/25/2012---> 10/30/2012  HPI/Subjective: No complaints.  For discharge to inpatient rehabilitation today.  Objective: Filed Vitals:   10/31/12  0700 10/31/12 1354 10/31/12 2140 11/01/12 0557  BP: 123/53 115/56 149/68 120/66  Pulse: 67 78 67 67  Temp: 97.6 F (36.4 C) 97.9 F (36.6 C) 97.8 F (36.6 C) 97.6 F (36.4 C)  TempSrc: Oral Oral Oral Oral  Resp: 18 18 16 20   Height:      Weight:      SpO2: 92% 97% 95% 93%    Intake/Output Summary (Last 24 hours) at 11/01/12 0943 Last data filed at 11/01/12 0559  Gross per 24 hour  Intake    840 ml  Output   2000 ml  Net  -1160 ml    Exam: Gen:  NAD Cardiovascular:  RRR, No M/R/G Respiratory:  Lungs CTAB Gastrointestinal:  Abdomen soft, NT/ND, + BS Extremities:  + edema  Data Reviewed: Basic Metabolic Panel:  Lab 10/28/12 2130 10/26/12 0401  NA 137 136  K 3.5 3.6  CL 98 97  CO2 30 27  GLUCOSE 119* 154*  BUN 16 17  CREATININE 0.84 1.07  CALCIUM 8.6 8.7  MG -- --  PHOS -- --   GFR Estimated Creatinine Clearance: 140.1 ml/min (by C-G formula based on Cr of 0.84).  CBC:  Lab 10/28/12 0356 10/27/12 0402 10/26/12 0401  WBC 9.4 10.3 11.2*  NEUTROABS -- -- --  HGB 12.6* 12.9* 13.4  HCT 38.4* 38.8* 40.8  MCV 94.6 94.2 95.1  PLT 150 136* 128*   BNP (last 3 results)  Basename 10/25/12 0510  PROBNP 201.8*   Microbiology Recent Results (from the past 240 hour(s))  URINE CULTURE     Status: Normal   Collection Time   10/25/12  1:22 AM  Component Value Range Status Comment   Specimen Description URINE, CATHETERIZED   Final    Special Requests NONE   Final    Culture  Setup Time 10/25/2012 08:56   Final    Colony Count >=100,000 COLONIES/ML   Final    Culture PROTEUS MIRABILIS   Final    Report Status 10/27/2012 FINAL   Final    Organism ID, Bacteria PROTEUS MIRABILIS   Final   RAPID STREP SCREEN     Status: Normal   Collection Time   10/25/12  1:57 AM      Component Value Range Status Comment   Streptococcus, Group A Screen (Direct) NEGATIVE  NEGATIVE Final   STREP A DNA PROBE     Status: Normal   Collection Time   10/25/12  1:57 AM      Component  Value Range Status Comment   Specimen Description THROAT   Final    Special Requests Normal   Final    Group A Strep Probe NEGATIVE   Final    Report Status 10/26/2012 FINAL   Final      Procedures and Diagnostic Studies: Ct Head Wo Contrast  10/25/2012  *RADIOLOGY REPORT*  Clinical Data: Fall.  CT HEAD WITHOUT CONTRAST  Technique:  Contiguous axial images were obtained from the base of the skull through the vertex without contrast.  Comparison: None.  Findings: No skull fracture or intracranial hemorrhage.  Mild atrophy without hydrocephalus.  No CT evidence of large acute infarct.  No intracranial mass lesion detected on this unenhanced exam.  Vascular calcifications.  Orbital structures unremarkable.  Mastoid air cells, middle ear cavities and visualized paranasal sinuses are clear.  IMPRESSION: No skull fracture or intracranial hemorrhage.   Original Report Authenticated By: Lacy Duverney, M.D.    Dg Chest Portable 1 View  10/25/2012  *RADIOLOGY REPORT*  Clinical Data: Fever for 4 days.  PORTABLE CHEST - 1 VIEW  Comparison: Single view of the chest 05/25/2007.  Findings: Lungs are clear.  Heart size normal.  No pneumothorax or pleural effusion.  Elevation of the right hemidiaphragm noted.  IMPRESSION: No acute abnormality.   Original Report Authenticated By: Holley Dexter, M.D.     Scheduled Meds:   . antiseptic oral rinse  15 mL Mouth Rinse q12n4p  . chlorhexidine  15 mL Mouth Rinse BID  . docusate sodium  100 mg Oral BID  . polyethylene glycol  17 g Oral Daily   Continuous Infusions:   Time spent: 25 minutes.   LOS: 7 days   [name],Jerry  [facility] Pager [phone].  If 8PM-8AM, please contact night-coverage at [email or web address], password [facility] 11/01/2012, 9:43 AM

## 2012-11-01 NOTE — Progress Notes (Signed)
Patient discharged per MD order to inpatient rehab at [facility]. Report called to RN at facility and all questions answered. [facility] transported patient to IP rehab facility. Angelena Form, RN

## 2012-11-01 NOTE — Plan of Care (Signed)
Problem: Phase III Progression Outcomes Goal: Voiding independently Outcome: Not Met (add Reason) Patient discharged with foley catheter to inpatient rehab.

## 2012-11-27 DIAGNOSIS — N390 Urinary tract infection, site not specified: Secondary | ICD-10-CM | POA: Diagnosis not present

## 2012-11-27 DIAGNOSIS — F411 Generalized anxiety disorder: Secondary | ICD-10-CM | POA: Diagnosis not present

## 2012-11-27 DIAGNOSIS — R339 Retention of urine, unspecified: Secondary | ICD-10-CM | POA: Diagnosis not present

## 2012-11-27 DIAGNOSIS — Z466 Encounter for fitting and adjustment of urinary device: Secondary | ICD-10-CM | POA: Diagnosis not present

## 2012-11-27 DIAGNOSIS — A048 Other specified bacterial intestinal infections: Secondary | ICD-10-CM | POA: Diagnosis not present

## 2012-11-27 DIAGNOSIS — R609 Edema, unspecified: Secondary | ICD-10-CM | POA: Diagnosis not present

## 2012-11-30 DIAGNOSIS — R339 Retention of urine, unspecified: Secondary | ICD-10-CM | POA: Diagnosis not present

## 2012-11-30 DIAGNOSIS — R609 Edema, unspecified: Secondary | ICD-10-CM | POA: Diagnosis not present

## 2012-11-30 DIAGNOSIS — Z466 Encounter for fitting and adjustment of urinary device: Secondary | ICD-10-CM | POA: Diagnosis not present

## 2012-11-30 DIAGNOSIS — A048 Other specified bacterial intestinal infections: Secondary | ICD-10-CM | POA: Diagnosis not present

## 2012-11-30 DIAGNOSIS — F411 Generalized anxiety disorder: Secondary | ICD-10-CM | POA: Diagnosis not present

## 2012-11-30 DIAGNOSIS — N390 Urinary tract infection, site not specified: Secondary | ICD-10-CM | POA: Diagnosis not present

## 2012-12-02 DIAGNOSIS — Z466 Encounter for fitting and adjustment of urinary device: Secondary | ICD-10-CM | POA: Diagnosis not present

## 2012-12-02 DIAGNOSIS — R339 Retention of urine, unspecified: Secondary | ICD-10-CM | POA: Diagnosis not present

## 2012-12-02 DIAGNOSIS — F411 Generalized anxiety disorder: Secondary | ICD-10-CM | POA: Diagnosis not present

## 2012-12-02 DIAGNOSIS — R609 Edema, unspecified: Secondary | ICD-10-CM | POA: Diagnosis not present

## 2012-12-02 DIAGNOSIS — N390 Urinary tract infection, site not specified: Secondary | ICD-10-CM | POA: Diagnosis not present

## 2012-12-02 DIAGNOSIS — A048 Other specified bacterial intestinal infections: Secondary | ICD-10-CM | POA: Diagnosis not present

## 2012-12-04 DIAGNOSIS — A048 Other specified bacterial intestinal infections: Secondary | ICD-10-CM | POA: Diagnosis not present

## 2012-12-04 DIAGNOSIS — R609 Edema, unspecified: Secondary | ICD-10-CM | POA: Diagnosis not present

## 2012-12-05 DIAGNOSIS — F411 Generalized anxiety disorder: Secondary | ICD-10-CM | POA: Diagnosis not present

## 2012-12-06 DIAGNOSIS — F411 Generalized anxiety disorder: Secondary | ICD-10-CM | POA: Diagnosis not present

## 2012-12-06 DIAGNOSIS — R339 Retention of urine, unspecified: Secondary | ICD-10-CM | POA: Diagnosis not present

## 2012-12-06 DIAGNOSIS — E1049 Type 1 diabetes mellitus with other diabetic neurological complication: Secondary | ICD-10-CM | POA: Diagnosis not present

## 2012-12-06 DIAGNOSIS — R609 Edema, unspecified: Secondary | ICD-10-CM | POA: Diagnosis not present

## 2012-12-06 DIAGNOSIS — A048 Other specified bacterial intestinal infections: Secondary | ICD-10-CM | POA: Diagnosis not present

## 2012-12-06 DIAGNOSIS — N390 Urinary tract infection, site not specified: Secondary | ICD-10-CM | POA: Diagnosis not present

## 2012-12-06 DIAGNOSIS — Z466 Encounter for fitting and adjustment of urinary device: Secondary | ICD-10-CM | POA: Diagnosis not present

## 2012-12-09 DIAGNOSIS — D649 Anemia, unspecified: Secondary | ICD-10-CM | POA: Diagnosis not present

## 2012-12-09 DIAGNOSIS — Z79899 Other long term (current) drug therapy: Secondary | ICD-10-CM | POA: Diagnosis not present

## 2012-12-09 DIAGNOSIS — R339 Retention of urine, unspecified: Secondary | ICD-10-CM | POA: Diagnosis not present

## 2012-12-09 DIAGNOSIS — N390 Urinary tract infection, site not specified: Secondary | ICD-10-CM | POA: Diagnosis not present

## 2012-12-10 DIAGNOSIS — Z466 Encounter for fitting and adjustment of urinary device: Secondary | ICD-10-CM | POA: Diagnosis not present

## 2012-12-10 DIAGNOSIS — N390 Urinary tract infection, site not specified: Secondary | ICD-10-CM | POA: Diagnosis not present

## 2012-12-10 DIAGNOSIS — F411 Generalized anxiety disorder: Secondary | ICD-10-CM | POA: Diagnosis not present

## 2012-12-10 DIAGNOSIS — A048 Other specified bacterial intestinal infections: Secondary | ICD-10-CM | POA: Diagnosis not present

## 2012-12-10 DIAGNOSIS — R609 Edema, unspecified: Secondary | ICD-10-CM | POA: Diagnosis not present

## 2012-12-10 DIAGNOSIS — R339 Retention of urine, unspecified: Secondary | ICD-10-CM | POA: Diagnosis not present

## 2012-12-11 DIAGNOSIS — R609 Edema, unspecified: Secondary | ICD-10-CM | POA: Diagnosis not present

## 2012-12-11 DIAGNOSIS — F411 Generalized anxiety disorder: Secondary | ICD-10-CM | POA: Diagnosis not present

## 2012-12-11 DIAGNOSIS — N390 Urinary tract infection, site not specified: Secondary | ICD-10-CM | POA: Diagnosis not present

## 2012-12-11 DIAGNOSIS — Z466 Encounter for fitting and adjustment of urinary device: Secondary | ICD-10-CM | POA: Diagnosis not present

## 2012-12-11 DIAGNOSIS — A048 Other specified bacterial intestinal infections: Secondary | ICD-10-CM | POA: Diagnosis not present

## 2012-12-11 DIAGNOSIS — R339 Retention of urine, unspecified: Secondary | ICD-10-CM | POA: Diagnosis not present

## 2012-12-12 DIAGNOSIS — F411 Generalized anxiety disorder: Secondary | ICD-10-CM | POA: Diagnosis not present

## 2012-12-12 DIAGNOSIS — R339 Retention of urine, unspecified: Secondary | ICD-10-CM | POA: Diagnosis not present

## 2012-12-12 DIAGNOSIS — Z466 Encounter for fitting and adjustment of urinary device: Secondary | ICD-10-CM | POA: Diagnosis not present

## 2012-12-12 DIAGNOSIS — R609 Edema, unspecified: Secondary | ICD-10-CM | POA: Diagnosis not present

## 2012-12-12 DIAGNOSIS — N390 Urinary tract infection, site not specified: Secondary | ICD-10-CM | POA: Diagnosis not present

## 2012-12-12 DIAGNOSIS — A048 Other specified bacterial intestinal infections: Secondary | ICD-10-CM | POA: Diagnosis not present

## 2012-12-13 DIAGNOSIS — F411 Generalized anxiety disorder: Secondary | ICD-10-CM | POA: Diagnosis not present

## 2012-12-13 DIAGNOSIS — R339 Retention of urine, unspecified: Secondary | ICD-10-CM | POA: Diagnosis not present

## 2012-12-13 DIAGNOSIS — Z466 Encounter for fitting and adjustment of urinary device: Secondary | ICD-10-CM | POA: Diagnosis not present

## 2012-12-13 DIAGNOSIS — N390 Urinary tract infection, site not specified: Secondary | ICD-10-CM | POA: Diagnosis not present

## 2012-12-13 DIAGNOSIS — R609 Edema, unspecified: Secondary | ICD-10-CM | POA: Diagnosis not present

## 2012-12-13 DIAGNOSIS — A048 Other specified bacterial intestinal infections: Secondary | ICD-10-CM | POA: Diagnosis not present

## 2012-12-14 DIAGNOSIS — A048 Other specified bacterial intestinal infections: Secondary | ICD-10-CM | POA: Diagnosis not present

## 2012-12-14 DIAGNOSIS — F411 Generalized anxiety disorder: Secondary | ICD-10-CM | POA: Diagnosis not present

## 2012-12-14 DIAGNOSIS — N390 Urinary tract infection, site not specified: Secondary | ICD-10-CM | POA: Diagnosis not present

## 2012-12-16 DIAGNOSIS — R609 Edema, unspecified: Secondary | ICD-10-CM | POA: Diagnosis not present

## 2012-12-17 DIAGNOSIS — N390 Urinary tract infection, site not specified: Secondary | ICD-10-CM | POA: Diagnosis not present

## 2012-12-17 DIAGNOSIS — R609 Edema, unspecified: Secondary | ICD-10-CM | POA: Diagnosis not present

## 2012-12-17 DIAGNOSIS — F411 Generalized anxiety disorder: Secondary | ICD-10-CM | POA: Diagnosis not present

## 2012-12-17 DIAGNOSIS — R339 Retention of urine, unspecified: Secondary | ICD-10-CM | POA: Diagnosis not present

## 2012-12-17 DIAGNOSIS — A048 Other specified bacterial intestinal infections: Secondary | ICD-10-CM | POA: Diagnosis not present

## 2012-12-17 DIAGNOSIS — Z466 Encounter for fitting and adjustment of urinary device: Secondary | ICD-10-CM | POA: Diagnosis not present

## 2012-12-18 DIAGNOSIS — R609 Edema, unspecified: Secondary | ICD-10-CM | POA: Diagnosis not present

## 2012-12-18 DIAGNOSIS — N390 Urinary tract infection, site not specified: Secondary | ICD-10-CM | POA: Diagnosis not present

## 2012-12-18 DIAGNOSIS — A048 Other specified bacterial intestinal infections: Secondary | ICD-10-CM | POA: Diagnosis not present

## 2012-12-18 DIAGNOSIS — F411 Generalized anxiety disorder: Secondary | ICD-10-CM | POA: Diagnosis not present

## 2012-12-18 DIAGNOSIS — Z466 Encounter for fitting and adjustment of urinary device: Secondary | ICD-10-CM | POA: Diagnosis not present

## 2012-12-18 DIAGNOSIS — R339 Retention of urine, unspecified: Secondary | ICD-10-CM | POA: Diagnosis not present

## 2012-12-19 DIAGNOSIS — R609 Edema, unspecified: Secondary | ICD-10-CM | POA: Diagnosis not present

## 2012-12-19 DIAGNOSIS — N390 Urinary tract infection, site not specified: Secondary | ICD-10-CM | POA: Diagnosis not present

## 2012-12-19 DIAGNOSIS — F411 Generalized anxiety disorder: Secondary | ICD-10-CM | POA: Diagnosis not present

## 2012-12-21 DIAGNOSIS — N390 Urinary tract infection, site not specified: Secondary | ICD-10-CM | POA: Diagnosis not present

## 2012-12-21 DIAGNOSIS — F411 Generalized anxiety disorder: Secondary | ICD-10-CM | POA: Diagnosis not present

## 2012-12-24 DIAGNOSIS — Z466 Encounter for fitting and adjustment of urinary device: Secondary | ICD-10-CM | POA: Diagnosis not present

## 2012-12-24 DIAGNOSIS — F411 Generalized anxiety disorder: Secondary | ICD-10-CM | POA: Diagnosis not present

## 2012-12-24 DIAGNOSIS — R609 Edema, unspecified: Secondary | ICD-10-CM | POA: Diagnosis not present

## 2012-12-24 DIAGNOSIS — R339 Retention of urine, unspecified: Secondary | ICD-10-CM | POA: Diagnosis not present

## 2012-12-24 DIAGNOSIS — A048 Other specified bacterial intestinal infections: Secondary | ICD-10-CM | POA: Diagnosis not present

## 2012-12-24 DIAGNOSIS — N390 Urinary tract infection, site not specified: Secondary | ICD-10-CM | POA: Diagnosis not present

## 2012-12-25 DIAGNOSIS — R339 Retention of urine, unspecified: Secondary | ICD-10-CM | POA: Diagnosis not present

## 2012-12-25 DIAGNOSIS — Z466 Encounter for fitting and adjustment of urinary device: Secondary | ICD-10-CM | POA: Diagnosis not present

## 2012-12-25 DIAGNOSIS — F411 Generalized anxiety disorder: Secondary | ICD-10-CM | POA: Diagnosis not present

## 2012-12-25 DIAGNOSIS — N390 Urinary tract infection, site not specified: Secondary | ICD-10-CM | POA: Diagnosis not present

## 2012-12-25 DIAGNOSIS — A048 Other specified bacterial intestinal infections: Secondary | ICD-10-CM | POA: Diagnosis not present

## 2012-12-25 DIAGNOSIS — R609 Edema, unspecified: Secondary | ICD-10-CM | POA: Diagnosis not present

## 2012-12-26 DIAGNOSIS — F411 Generalized anxiety disorder: Secondary | ICD-10-CM | POA: Diagnosis not present

## 2012-12-26 DIAGNOSIS — R609 Edema, unspecified: Secondary | ICD-10-CM | POA: Diagnosis not present

## 2012-12-26 DIAGNOSIS — R339 Retention of urine, unspecified: Secondary | ICD-10-CM | POA: Diagnosis not present

## 2012-12-26 DIAGNOSIS — N390 Urinary tract infection, site not specified: Secondary | ICD-10-CM | POA: Diagnosis not present

## 2012-12-27 DIAGNOSIS — R339 Retention of urine, unspecified: Secondary | ICD-10-CM | POA: Diagnosis not present

## 2012-12-27 DIAGNOSIS — N390 Urinary tract infection, site not specified: Secondary | ICD-10-CM | POA: Diagnosis not present

## 2012-12-30 DIAGNOSIS — R609 Edema, unspecified: Secondary | ICD-10-CM | POA: Diagnosis not present

## 2012-12-30 DIAGNOSIS — A048 Other specified bacterial intestinal infections: Secondary | ICD-10-CM | POA: Diagnosis not present

## 2012-12-30 DIAGNOSIS — F411 Generalized anxiety disorder: Secondary | ICD-10-CM | POA: Diagnosis not present

## 2012-12-30 DIAGNOSIS — R339 Retention of urine, unspecified: Secondary | ICD-10-CM | POA: Diagnosis not present

## 2012-12-30 DIAGNOSIS — N390 Urinary tract infection, site not specified: Secondary | ICD-10-CM | POA: Diagnosis not present

## 2012-12-30 DIAGNOSIS — Z466 Encounter for fitting and adjustment of urinary device: Secondary | ICD-10-CM | POA: Diagnosis not present

## 2012-12-31 DIAGNOSIS — R609 Edema, unspecified: Secondary | ICD-10-CM | POA: Diagnosis not present

## 2012-12-31 DIAGNOSIS — A048 Other specified bacterial intestinal infections: Secondary | ICD-10-CM | POA: Diagnosis not present

## 2013-01-01 DIAGNOSIS — R339 Retention of urine, unspecified: Secondary | ICD-10-CM | POA: Diagnosis not present

## 2013-01-02 DIAGNOSIS — N390 Urinary tract infection, site not specified: Secondary | ICD-10-CM | POA: Diagnosis not present

## 2013-01-03 DIAGNOSIS — N390 Urinary tract infection, site not specified: Secondary | ICD-10-CM | POA: Diagnosis not present

## 2013-01-03 DIAGNOSIS — Z466 Encounter for fitting and adjustment of urinary device: Secondary | ICD-10-CM | POA: Diagnosis not present

## 2013-01-03 DIAGNOSIS — A048 Other specified bacterial intestinal infections: Secondary | ICD-10-CM | POA: Diagnosis not present

## 2013-01-03 DIAGNOSIS — R609 Edema, unspecified: Secondary | ICD-10-CM | POA: Diagnosis not present

## 2013-01-03 DIAGNOSIS — R339 Retention of urine, unspecified: Secondary | ICD-10-CM | POA: Diagnosis not present

## 2013-01-03 DIAGNOSIS — F411 Generalized anxiety disorder: Secondary | ICD-10-CM | POA: Diagnosis not present

## 2013-01-07 DIAGNOSIS — N390 Urinary tract infection, site not specified: Secondary | ICD-10-CM | POA: Diagnosis not present

## 2013-01-07 DIAGNOSIS — R339 Retention of urine, unspecified: Secondary | ICD-10-CM | POA: Diagnosis not present

## 2013-01-07 DIAGNOSIS — R609 Edema, unspecified: Secondary | ICD-10-CM | POA: Diagnosis not present

## 2013-01-07 DIAGNOSIS — Z466 Encounter for fitting and adjustment of urinary device: Secondary | ICD-10-CM | POA: Diagnosis not present

## 2013-01-07 DIAGNOSIS — F411 Generalized anxiety disorder: Secondary | ICD-10-CM | POA: Diagnosis not present

## 2013-01-07 DIAGNOSIS — A048 Other specified bacterial intestinal infections: Secondary | ICD-10-CM | POA: Diagnosis not present

## 2013-01-08 DIAGNOSIS — N390 Urinary tract infection, site not specified: Secondary | ICD-10-CM | POA: Diagnosis not present

## 2013-01-08 DIAGNOSIS — R339 Retention of urine, unspecified: Secondary | ICD-10-CM | POA: Diagnosis not present

## 2013-01-08 DIAGNOSIS — R609 Edema, unspecified: Secondary | ICD-10-CM | POA: Diagnosis not present

## 2013-01-08 DIAGNOSIS — F411 Generalized anxiety disorder: Secondary | ICD-10-CM | POA: Diagnosis not present

## 2013-01-08 DIAGNOSIS — A048 Other specified bacterial intestinal infections: Secondary | ICD-10-CM | POA: Diagnosis not present

## 2013-01-08 DIAGNOSIS — Z466 Encounter for fitting and adjustment of urinary device: Secondary | ICD-10-CM | POA: Diagnosis not present

## 2013-01-09 DIAGNOSIS — R609 Edema, unspecified: Secondary | ICD-10-CM | POA: Diagnosis not present

## 2013-01-09 DIAGNOSIS — R339 Retention of urine, unspecified: Secondary | ICD-10-CM | POA: Diagnosis not present

## 2013-01-09 DIAGNOSIS — A048 Other specified bacterial intestinal infections: Secondary | ICD-10-CM | POA: Diagnosis not present

## 2013-01-09 DIAGNOSIS — F411 Generalized anxiety disorder: Secondary | ICD-10-CM | POA: Diagnosis not present

## 2013-01-09 DIAGNOSIS — Z466 Encounter for fitting and adjustment of urinary device: Secondary | ICD-10-CM | POA: Diagnosis not present

## 2013-01-09 DIAGNOSIS — N390 Urinary tract infection, site not specified: Secondary | ICD-10-CM | POA: Diagnosis not present

## 2013-01-10 DIAGNOSIS — F411 Generalized anxiety disorder: Secondary | ICD-10-CM | POA: Diagnosis not present

## 2013-01-10 DIAGNOSIS — R609 Edema, unspecified: Secondary | ICD-10-CM | POA: Diagnosis not present

## 2013-01-10 DIAGNOSIS — A048 Other specified bacterial intestinal infections: Secondary | ICD-10-CM | POA: Diagnosis not present

## 2013-01-10 DIAGNOSIS — N390 Urinary tract infection, site not specified: Secondary | ICD-10-CM | POA: Diagnosis not present

## 2013-01-10 DIAGNOSIS — Z466 Encounter for fitting and adjustment of urinary device: Secondary | ICD-10-CM | POA: Diagnosis not present

## 2013-01-10 DIAGNOSIS — R339 Retention of urine, unspecified: Secondary | ICD-10-CM | POA: Diagnosis not present

## 2013-01-13 DIAGNOSIS — Z466 Encounter for fitting and adjustment of urinary device: Secondary | ICD-10-CM | POA: Diagnosis not present

## 2013-01-13 DIAGNOSIS — N390 Urinary tract infection, site not specified: Secondary | ICD-10-CM | POA: Diagnosis not present

## 2013-01-13 DIAGNOSIS — A048 Other specified bacterial intestinal infections: Secondary | ICD-10-CM | POA: Diagnosis not present

## 2013-01-13 DIAGNOSIS — R609 Edema, unspecified: Secondary | ICD-10-CM | POA: Diagnosis not present

## 2013-01-13 DIAGNOSIS — R339 Retention of urine, unspecified: Secondary | ICD-10-CM | POA: Diagnosis not present

## 2013-01-13 DIAGNOSIS — F411 Generalized anxiety disorder: Secondary | ICD-10-CM | POA: Diagnosis not present

## 2013-01-14 DIAGNOSIS — F411 Generalized anxiety disorder: Secondary | ICD-10-CM | POA: Diagnosis not present

## 2013-01-14 DIAGNOSIS — Z466 Encounter for fitting and adjustment of urinary device: Secondary | ICD-10-CM | POA: Diagnosis not present

## 2013-01-14 DIAGNOSIS — A048 Other specified bacterial intestinal infections: Secondary | ICD-10-CM | POA: Diagnosis not present

## 2013-01-14 DIAGNOSIS — R339 Retention of urine, unspecified: Secondary | ICD-10-CM | POA: Diagnosis not present

## 2013-01-14 DIAGNOSIS — R609 Edema, unspecified: Secondary | ICD-10-CM | POA: Diagnosis not present

## 2013-01-14 DIAGNOSIS — N390 Urinary tract infection, site not specified: Secondary | ICD-10-CM | POA: Diagnosis not present

## 2013-01-16 DIAGNOSIS — A048 Other specified bacterial intestinal infections: Secondary | ICD-10-CM | POA: Diagnosis not present

## 2013-01-16 DIAGNOSIS — F411 Generalized anxiety disorder: Secondary | ICD-10-CM | POA: Diagnosis not present

## 2013-01-16 DIAGNOSIS — Z466 Encounter for fitting and adjustment of urinary device: Secondary | ICD-10-CM | POA: Diagnosis not present

## 2013-01-16 DIAGNOSIS — R339 Retention of urine, unspecified: Secondary | ICD-10-CM | POA: Diagnosis not present

## 2013-01-16 DIAGNOSIS — R609 Edema, unspecified: Secondary | ICD-10-CM | POA: Diagnosis not present

## 2013-01-16 DIAGNOSIS — N390 Urinary tract infection, site not specified: Secondary | ICD-10-CM | POA: Diagnosis not present

## 2013-01-17 DIAGNOSIS — N390 Urinary tract infection, site not specified: Secondary | ICD-10-CM | POA: Diagnosis not present

## 2013-01-17 DIAGNOSIS — R339 Retention of urine, unspecified: Secondary | ICD-10-CM | POA: Diagnosis not present

## 2013-01-17 DIAGNOSIS — Z466 Encounter for fitting and adjustment of urinary device: Secondary | ICD-10-CM | POA: Diagnosis not present

## 2013-01-17 DIAGNOSIS — A048 Other specified bacterial intestinal infections: Secondary | ICD-10-CM | POA: Diagnosis not present

## 2013-01-17 DIAGNOSIS — R609 Edema, unspecified: Secondary | ICD-10-CM | POA: Diagnosis not present

## 2013-01-17 DIAGNOSIS — F411 Generalized anxiety disorder: Secondary | ICD-10-CM | POA: Diagnosis not present

## 2013-01-20 DIAGNOSIS — R609 Edema, unspecified: Secondary | ICD-10-CM | POA: Diagnosis not present

## 2013-01-20 DIAGNOSIS — N390 Urinary tract infection, site not specified: Secondary | ICD-10-CM | POA: Diagnosis not present

## 2013-01-20 DIAGNOSIS — R339 Retention of urine, unspecified: Secondary | ICD-10-CM | POA: Diagnosis not present

## 2013-01-20 DIAGNOSIS — Z466 Encounter for fitting and adjustment of urinary device: Secondary | ICD-10-CM | POA: Diagnosis not present

## 2013-01-20 DIAGNOSIS — A048 Other specified bacterial intestinal infections: Secondary | ICD-10-CM | POA: Diagnosis not present

## 2013-01-20 DIAGNOSIS — F411 Generalized anxiety disorder: Secondary | ICD-10-CM | POA: Diagnosis not present

## 2013-01-21 DIAGNOSIS — R339 Retention of urine, unspecified: Secondary | ICD-10-CM | POA: Diagnosis not present

## 2013-01-22 DIAGNOSIS — R609 Edema, unspecified: Secondary | ICD-10-CM | POA: Diagnosis not present

## 2013-01-22 DIAGNOSIS — R339 Retention of urine, unspecified: Secondary | ICD-10-CM | POA: Diagnosis not present

## 2013-01-22 DIAGNOSIS — N390 Urinary tract infection, site not specified: Secondary | ICD-10-CM | POA: Diagnosis not present

## 2013-01-22 DIAGNOSIS — F411 Generalized anxiety disorder: Secondary | ICD-10-CM | POA: Diagnosis not present

## 2013-01-22 DIAGNOSIS — Z466 Encounter for fitting and adjustment of urinary device: Secondary | ICD-10-CM | POA: Diagnosis not present

## 2013-01-22 DIAGNOSIS — A048 Other specified bacterial intestinal infections: Secondary | ICD-10-CM | POA: Diagnosis not present

## 2013-01-23 DIAGNOSIS — A048 Other specified bacterial intestinal infections: Secondary | ICD-10-CM | POA: Diagnosis not present

## 2013-01-23 DIAGNOSIS — Z466 Encounter for fitting and adjustment of urinary device: Secondary | ICD-10-CM | POA: Diagnosis not present

## 2013-01-23 DIAGNOSIS — R339 Retention of urine, unspecified: Secondary | ICD-10-CM | POA: Diagnosis not present

## 2013-01-23 DIAGNOSIS — N390 Urinary tract infection, site not specified: Secondary | ICD-10-CM | POA: Diagnosis not present

## 2013-01-23 DIAGNOSIS — F411 Generalized anxiety disorder: Secondary | ICD-10-CM | POA: Diagnosis not present

## 2013-01-23 DIAGNOSIS — R609 Edema, unspecified: Secondary | ICD-10-CM | POA: Diagnosis not present

## 2013-01-24 DIAGNOSIS — R339 Retention of urine, unspecified: Secondary | ICD-10-CM | POA: Diagnosis not present

## 2013-01-24 DIAGNOSIS — A048 Other specified bacterial intestinal infections: Secondary | ICD-10-CM | POA: Diagnosis not present

## 2013-01-24 DIAGNOSIS — F411 Generalized anxiety disorder: Secondary | ICD-10-CM | POA: Diagnosis not present

## 2013-01-24 DIAGNOSIS — Z466 Encounter for fitting and adjustment of urinary device: Secondary | ICD-10-CM | POA: Diagnosis not present

## 2013-01-24 DIAGNOSIS — N390 Urinary tract infection, site not specified: Secondary | ICD-10-CM | POA: Diagnosis not present

## 2013-01-24 DIAGNOSIS — R609 Edema, unspecified: Secondary | ICD-10-CM | POA: Diagnosis not present

## 2013-01-26 DIAGNOSIS — R339 Retention of urine, unspecified: Secondary | ICD-10-CM | POA: Diagnosis not present

## 2013-01-26 DIAGNOSIS — Z87440 Personal history of urinary (tract) infections: Secondary | ICD-10-CM | POA: Diagnosis not present

## 2013-01-26 DIAGNOSIS — G4730 Sleep apnea, unspecified: Secondary | ICD-10-CM | POA: Diagnosis not present

## 2013-01-26 DIAGNOSIS — F411 Generalized anxiety disorder: Secondary | ICD-10-CM | POA: Diagnosis not present

## 2013-01-26 DIAGNOSIS — Z466 Encounter for fitting and adjustment of urinary device: Secondary | ICD-10-CM | POA: Diagnosis not present

## 2013-01-26 DIAGNOSIS — M19049 Primary osteoarthritis, unspecified hand: Secondary | ICD-10-CM | POA: Diagnosis not present

## 2013-01-26 DIAGNOSIS — R609 Edema, unspecified: Secondary | ICD-10-CM | POA: Diagnosis not present

## 2013-01-27 DIAGNOSIS — R339 Retention of urine, unspecified: Secondary | ICD-10-CM | POA: Diagnosis not present

## 2013-01-27 DIAGNOSIS — R609 Edema, unspecified: Secondary | ICD-10-CM | POA: Diagnosis not present

## 2013-01-27 DIAGNOSIS — F411 Generalized anxiety disorder: Secondary | ICD-10-CM | POA: Diagnosis not present

## 2013-01-27 DIAGNOSIS — G4730 Sleep apnea, unspecified: Secondary | ICD-10-CM | POA: Diagnosis not present

## 2013-01-27 DIAGNOSIS — Z87440 Personal history of urinary (tract) infections: Secondary | ICD-10-CM | POA: Diagnosis not present

## 2013-01-27 DIAGNOSIS — Z466 Encounter for fitting and adjustment of urinary device: Secondary | ICD-10-CM | POA: Diagnosis not present

## 2013-01-29 DIAGNOSIS — R339 Retention of urine, unspecified: Secondary | ICD-10-CM | POA: Diagnosis not present

## 2013-01-29 DIAGNOSIS — R609 Edema, unspecified: Secondary | ICD-10-CM | POA: Diagnosis not present

## 2013-01-29 DIAGNOSIS — Z466 Encounter for fitting and adjustment of urinary device: Secondary | ICD-10-CM | POA: Diagnosis not present

## 2013-01-29 DIAGNOSIS — Z87440 Personal history of urinary (tract) infections: Secondary | ICD-10-CM | POA: Diagnosis not present

## 2013-01-29 DIAGNOSIS — F411 Generalized anxiety disorder: Secondary | ICD-10-CM | POA: Diagnosis not present

## 2013-01-29 DIAGNOSIS — G4730 Sleep apnea, unspecified: Secondary | ICD-10-CM | POA: Diagnosis not present

## 2013-01-31 DIAGNOSIS — R339 Retention of urine, unspecified: Secondary | ICD-10-CM | POA: Diagnosis not present

## 2013-01-31 DIAGNOSIS — F411 Generalized anxiety disorder: Secondary | ICD-10-CM | POA: Diagnosis not present

## 2013-01-31 DIAGNOSIS — G4730 Sleep apnea, unspecified: Secondary | ICD-10-CM | POA: Diagnosis not present

## 2013-01-31 DIAGNOSIS — R609 Edema, unspecified: Secondary | ICD-10-CM | POA: Diagnosis not present

## 2013-01-31 DIAGNOSIS — Z87440 Personal history of urinary (tract) infections: Secondary | ICD-10-CM | POA: Diagnosis not present

## 2013-01-31 DIAGNOSIS — Z466 Encounter for fitting and adjustment of urinary device: Secondary | ICD-10-CM | POA: Diagnosis not present

## 2013-02-02 DIAGNOSIS — R609 Edema, unspecified: Secondary | ICD-10-CM | POA: Diagnosis not present

## 2013-02-02 DIAGNOSIS — F411 Generalized anxiety disorder: Secondary | ICD-10-CM | POA: Diagnosis not present

## 2013-02-02 DIAGNOSIS — R339 Retention of urine, unspecified: Secondary | ICD-10-CM | POA: Diagnosis not present

## 2013-02-02 DIAGNOSIS — G4730 Sleep apnea, unspecified: Secondary | ICD-10-CM | POA: Diagnosis not present

## 2013-02-02 DIAGNOSIS — Z87440 Personal history of urinary (tract) infections: Secondary | ICD-10-CM | POA: Diagnosis not present

## 2013-02-02 DIAGNOSIS — Z466 Encounter for fitting and adjustment of urinary device: Secondary | ICD-10-CM | POA: Diagnosis not present

## 2013-02-03 DIAGNOSIS — G4730 Sleep apnea, unspecified: Secondary | ICD-10-CM | POA: Diagnosis not present

## 2013-02-03 DIAGNOSIS — R609 Edema, unspecified: Secondary | ICD-10-CM | POA: Diagnosis not present

## 2013-02-03 DIAGNOSIS — R339 Retention of urine, unspecified: Secondary | ICD-10-CM | POA: Diagnosis not present

## 2013-02-03 DIAGNOSIS — F411 Generalized anxiety disorder: Secondary | ICD-10-CM | POA: Diagnosis not present

## 2013-02-03 DIAGNOSIS — Z466 Encounter for fitting and adjustment of urinary device: Secondary | ICD-10-CM | POA: Diagnosis not present

## 2013-02-03 DIAGNOSIS — Z87440 Personal history of urinary (tract) infections: Secondary | ICD-10-CM | POA: Diagnosis not present

## 2013-02-04 DIAGNOSIS — R609 Edema, unspecified: Secondary | ICD-10-CM | POA: Diagnosis not present

## 2013-02-04 DIAGNOSIS — F411 Generalized anxiety disorder: Secondary | ICD-10-CM | POA: Diagnosis not present

## 2013-02-04 DIAGNOSIS — Z466 Encounter for fitting and adjustment of urinary device: Secondary | ICD-10-CM | POA: Diagnosis not present

## 2013-02-04 DIAGNOSIS — G4730 Sleep apnea, unspecified: Secondary | ICD-10-CM | POA: Diagnosis not present

## 2013-02-04 DIAGNOSIS — R339 Retention of urine, unspecified: Secondary | ICD-10-CM | POA: Diagnosis not present

## 2013-02-04 DIAGNOSIS — Z87440 Personal history of urinary (tract) infections: Secondary | ICD-10-CM | POA: Diagnosis not present

## 2013-02-05 DIAGNOSIS — Z466 Encounter for fitting and adjustment of urinary device: Secondary | ICD-10-CM | POA: Diagnosis not present

## 2013-02-05 DIAGNOSIS — R609 Edema, unspecified: Secondary | ICD-10-CM | POA: Diagnosis not present

## 2013-02-05 DIAGNOSIS — F411 Generalized anxiety disorder: Secondary | ICD-10-CM | POA: Diagnosis not present

## 2013-02-05 DIAGNOSIS — G4730 Sleep apnea, unspecified: Secondary | ICD-10-CM | POA: Diagnosis not present

## 2013-02-05 DIAGNOSIS — Z87440 Personal history of urinary (tract) infections: Secondary | ICD-10-CM | POA: Diagnosis not present

## 2013-02-05 DIAGNOSIS — R339 Retention of urine, unspecified: Secondary | ICD-10-CM | POA: Diagnosis not present

## 2013-02-06 DIAGNOSIS — Z466 Encounter for fitting and adjustment of urinary device: Secondary | ICD-10-CM | POA: Diagnosis not present

## 2013-02-06 DIAGNOSIS — F411 Generalized anxiety disorder: Secondary | ICD-10-CM | POA: Diagnosis not present

## 2013-02-06 DIAGNOSIS — R609 Edema, unspecified: Secondary | ICD-10-CM | POA: Diagnosis not present

## 2013-02-06 DIAGNOSIS — R339 Retention of urine, unspecified: Secondary | ICD-10-CM | POA: Diagnosis not present

## 2013-02-06 DIAGNOSIS — Z87440 Personal history of urinary (tract) infections: Secondary | ICD-10-CM | POA: Diagnosis not present

## 2013-02-06 DIAGNOSIS — G4730 Sleep apnea, unspecified: Secondary | ICD-10-CM | POA: Diagnosis not present

## 2013-02-09 DIAGNOSIS — R609 Edema, unspecified: Secondary | ICD-10-CM | POA: Diagnosis not present

## 2013-02-09 DIAGNOSIS — G4730 Sleep apnea, unspecified: Secondary | ICD-10-CM | POA: Diagnosis not present

## 2013-02-09 DIAGNOSIS — Z87440 Personal history of urinary (tract) infections: Secondary | ICD-10-CM | POA: Diagnosis not present

## 2013-02-09 DIAGNOSIS — F411 Generalized anxiety disorder: Secondary | ICD-10-CM | POA: Diagnosis not present

## 2013-02-09 DIAGNOSIS — R339 Retention of urine, unspecified: Secondary | ICD-10-CM | POA: Diagnosis not present

## 2013-02-09 DIAGNOSIS — Z466 Encounter for fitting and adjustment of urinary device: Secondary | ICD-10-CM | POA: Diagnosis not present

## 2013-02-10 DIAGNOSIS — G4730 Sleep apnea, unspecified: Secondary | ICD-10-CM | POA: Diagnosis not present

## 2013-02-10 DIAGNOSIS — F411 Generalized anxiety disorder: Secondary | ICD-10-CM | POA: Diagnosis not present

## 2013-02-10 DIAGNOSIS — R339 Retention of urine, unspecified: Secondary | ICD-10-CM | POA: Diagnosis not present

## 2013-02-10 DIAGNOSIS — R609 Edema, unspecified: Secondary | ICD-10-CM | POA: Diagnosis not present

## 2013-02-10 DIAGNOSIS — Z87440 Personal history of urinary (tract) infections: Secondary | ICD-10-CM | POA: Diagnosis not present

## 2013-02-10 DIAGNOSIS — Z466 Encounter for fitting and adjustment of urinary device: Secondary | ICD-10-CM | POA: Diagnosis not present

## 2013-02-11 DIAGNOSIS — R609 Edema, unspecified: Secondary | ICD-10-CM | POA: Diagnosis not present

## 2013-02-11 DIAGNOSIS — R339 Retention of urine, unspecified: Secondary | ICD-10-CM | POA: Diagnosis not present

## 2013-02-11 DIAGNOSIS — Z87440 Personal history of urinary (tract) infections: Secondary | ICD-10-CM | POA: Diagnosis not present

## 2013-02-11 DIAGNOSIS — F411 Generalized anxiety disorder: Secondary | ICD-10-CM | POA: Diagnosis not present

## 2013-02-11 DIAGNOSIS — G4730 Sleep apnea, unspecified: Secondary | ICD-10-CM | POA: Diagnosis not present

## 2013-02-11 DIAGNOSIS — Z466 Encounter for fitting and adjustment of urinary device: Secondary | ICD-10-CM | POA: Diagnosis not present

## 2013-02-12 DIAGNOSIS — R609 Edema, unspecified: Secondary | ICD-10-CM | POA: Diagnosis not present

## 2013-02-12 DIAGNOSIS — R339 Retention of urine, unspecified: Secondary | ICD-10-CM | POA: Diagnosis not present

## 2013-02-12 DIAGNOSIS — F411 Generalized anxiety disorder: Secondary | ICD-10-CM | POA: Diagnosis not present

## 2013-02-12 DIAGNOSIS — Z466 Encounter for fitting and adjustment of urinary device: Secondary | ICD-10-CM | POA: Diagnosis not present

## 2013-02-12 DIAGNOSIS — G4730 Sleep apnea, unspecified: Secondary | ICD-10-CM | POA: Diagnosis not present

## 2013-02-12 DIAGNOSIS — Z87440 Personal history of urinary (tract) infections: Secondary | ICD-10-CM | POA: Diagnosis not present

## 2013-02-13 DIAGNOSIS — R339 Retention of urine, unspecified: Secondary | ICD-10-CM | POA: Diagnosis not present

## 2013-02-14 DIAGNOSIS — R339 Retention of urine, unspecified: Secondary | ICD-10-CM | POA: Diagnosis not present

## 2013-02-14 DIAGNOSIS — Z87440 Personal history of urinary (tract) infections: Secondary | ICD-10-CM | POA: Diagnosis not present

## 2013-02-14 DIAGNOSIS — G4730 Sleep apnea, unspecified: Secondary | ICD-10-CM | POA: Diagnosis not present

## 2013-02-14 DIAGNOSIS — Z466 Encounter for fitting and adjustment of urinary device: Secondary | ICD-10-CM | POA: Diagnosis not present

## 2013-02-14 DIAGNOSIS — R609 Edema, unspecified: Secondary | ICD-10-CM | POA: Diagnosis not present

## 2013-02-14 DIAGNOSIS — F411 Generalized anxiety disorder: Secondary | ICD-10-CM | POA: Diagnosis not present

## 2013-02-17 DIAGNOSIS — L608 Other nail disorders: Secondary | ICD-10-CM | POA: Diagnosis not present

## 2013-02-17 DIAGNOSIS — I739 Peripheral vascular disease, unspecified: Secondary | ICD-10-CM | POA: Diagnosis not present

## 2013-02-17 DIAGNOSIS — L84 Corns and callosities: Secondary | ICD-10-CM | POA: Diagnosis not present

## 2013-02-17 DIAGNOSIS — E1159 Type 2 diabetes mellitus with other circulatory complications: Secondary | ICD-10-CM | POA: Diagnosis not present

## 2013-02-18 DIAGNOSIS — Z466 Encounter for fitting and adjustment of urinary device: Secondary | ICD-10-CM | POA: Diagnosis not present

## 2013-02-18 DIAGNOSIS — R339 Retention of urine, unspecified: Secondary | ICD-10-CM | POA: Diagnosis not present

## 2013-02-18 DIAGNOSIS — G4730 Sleep apnea, unspecified: Secondary | ICD-10-CM | POA: Diagnosis not present

## 2013-02-18 DIAGNOSIS — R609 Edema, unspecified: Secondary | ICD-10-CM | POA: Diagnosis not present

## 2013-02-18 DIAGNOSIS — Z87440 Personal history of urinary (tract) infections: Secondary | ICD-10-CM | POA: Diagnosis not present

## 2013-02-18 DIAGNOSIS — F411 Generalized anxiety disorder: Secondary | ICD-10-CM | POA: Diagnosis not present

## 2013-02-19 DIAGNOSIS — Z466 Encounter for fitting and adjustment of urinary device: Secondary | ICD-10-CM | POA: Diagnosis not present

## 2013-02-19 DIAGNOSIS — Z87440 Personal history of urinary (tract) infections: Secondary | ICD-10-CM | POA: Diagnosis not present

## 2013-02-19 DIAGNOSIS — F411 Generalized anxiety disorder: Secondary | ICD-10-CM | POA: Diagnosis not present

## 2013-02-19 DIAGNOSIS — G4730 Sleep apnea, unspecified: Secondary | ICD-10-CM | POA: Diagnosis not present

## 2013-02-19 DIAGNOSIS — R339 Retention of urine, unspecified: Secondary | ICD-10-CM | POA: Diagnosis not present

## 2013-02-19 DIAGNOSIS — R609 Edema, unspecified: Secondary | ICD-10-CM | POA: Diagnosis not present

## 2013-02-20 DIAGNOSIS — Z87440 Personal history of urinary (tract) infections: Secondary | ICD-10-CM | POA: Diagnosis not present

## 2013-02-20 DIAGNOSIS — F411 Generalized anxiety disorder: Secondary | ICD-10-CM | POA: Diagnosis not present

## 2013-02-20 DIAGNOSIS — R339 Retention of urine, unspecified: Secondary | ICD-10-CM | POA: Diagnosis not present

## 2013-02-20 DIAGNOSIS — Z466 Encounter for fitting and adjustment of urinary device: Secondary | ICD-10-CM | POA: Diagnosis not present

## 2013-02-20 DIAGNOSIS — R609 Edema, unspecified: Secondary | ICD-10-CM | POA: Diagnosis not present

## 2013-02-20 DIAGNOSIS — G4730 Sleep apnea, unspecified: Secondary | ICD-10-CM | POA: Diagnosis not present

## 2013-02-21 DIAGNOSIS — R609 Edema, unspecified: Secondary | ICD-10-CM | POA: Diagnosis not present

## 2013-02-21 DIAGNOSIS — Z87440 Personal history of urinary (tract) infections: Secondary | ICD-10-CM | POA: Diagnosis not present

## 2013-02-21 DIAGNOSIS — G4730 Sleep apnea, unspecified: Secondary | ICD-10-CM | POA: Diagnosis not present

## 2013-02-21 DIAGNOSIS — F411 Generalized anxiety disorder: Secondary | ICD-10-CM | POA: Diagnosis not present

## 2013-02-21 DIAGNOSIS — Z466 Encounter for fitting and adjustment of urinary device: Secondary | ICD-10-CM | POA: Diagnosis not present

## 2013-02-21 DIAGNOSIS — R339 Retention of urine, unspecified: Secondary | ICD-10-CM | POA: Diagnosis not present

## 2013-02-25 DIAGNOSIS — R339 Retention of urine, unspecified: Secondary | ICD-10-CM | POA: Diagnosis not present

## 2013-02-27 DIAGNOSIS — N390 Urinary tract infection, site not specified: Secondary | ICD-10-CM | POA: Diagnosis not present

## 2013-02-27 DIAGNOSIS — Z01810 Encounter for preprocedural cardiovascular examination: Secondary | ICD-10-CM | POA: Diagnosis not present

## 2013-02-27 DIAGNOSIS — R339 Retention of urine, unspecified: Secondary | ICD-10-CM | POA: Diagnosis not present

## 2013-03-22 DIAGNOSIS — R339 Retention of urine, unspecified: Secondary | ICD-10-CM | POA: Diagnosis not present

## 2013-03-22 DIAGNOSIS — F411 Generalized anxiety disorder: Secondary | ICD-10-CM | POA: Diagnosis not present

## 2013-03-22 DIAGNOSIS — G4730 Sleep apnea, unspecified: Secondary | ICD-10-CM | POA: Diagnosis not present

## 2013-03-22 DIAGNOSIS — Z87440 Personal history of urinary (tract) infections: Secondary | ICD-10-CM | POA: Diagnosis not present

## 2013-03-22 DIAGNOSIS — R609 Edema, unspecified: Secondary | ICD-10-CM | POA: Diagnosis not present

## 2013-03-22 DIAGNOSIS — Z466 Encounter for fitting and adjustment of urinary device: Secondary | ICD-10-CM | POA: Diagnosis not present

## 2013-03-25 DIAGNOSIS — Z79899 Other long term (current) drug therapy: Secondary | ICD-10-CM | POA: Diagnosis not present

## 2013-03-25 DIAGNOSIS — R339 Retention of urine, unspecified: Secondary | ICD-10-CM | POA: Diagnosis not present

## 2013-03-25 DIAGNOSIS — N319 Neuromuscular dysfunction of bladder, unspecified: Secondary | ICD-10-CM | POA: Diagnosis not present

## 2013-03-25 DIAGNOSIS — F4310 Post-traumatic stress disorder, unspecified: Secondary | ICD-10-CM | POA: Diagnosis not present

## 2013-03-25 DIAGNOSIS — M159 Polyosteoarthritis, unspecified: Secondary | ICD-10-CM | POA: Diagnosis not present

## 2013-03-25 DIAGNOSIS — Z7982 Long term (current) use of aspirin: Secondary | ICD-10-CM | POA: Diagnosis not present

## 2013-03-25 DIAGNOSIS — Z8614 Personal history of Methicillin resistant Staphylococcus aureus infection: Secondary | ICD-10-CM | POA: Diagnosis not present

## 2013-03-25 DIAGNOSIS — R338 Other retention of urine: Secondary | ICD-10-CM | POA: Diagnosis not present

## 2013-03-25 DIAGNOSIS — N210 Calculus in bladder: Secondary | ICD-10-CM | POA: Diagnosis not present

## 2013-03-25 DIAGNOSIS — N401 Enlarged prostate with lower urinary tract symptoms: Secondary | ICD-10-CM | POA: Diagnosis not present

## 2013-03-25 DIAGNOSIS — Z9889 Other specified postprocedural states: Secondary | ICD-10-CM | POA: Diagnosis not present

## 2013-03-26 DIAGNOSIS — R338 Other retention of urine: Secondary | ICD-10-CM | POA: Diagnosis not present

## 2013-03-26 DIAGNOSIS — N210 Calculus in bladder: Secondary | ICD-10-CM | POA: Diagnosis not present

## 2013-03-26 DIAGNOSIS — N401 Enlarged prostate with lower urinary tract symptoms: Secondary | ICD-10-CM | POA: Diagnosis not present

## 2013-03-26 DIAGNOSIS — N319 Neuromuscular dysfunction of bladder, unspecified: Secondary | ICD-10-CM | POA: Diagnosis not present

## 2013-03-26 DIAGNOSIS — F4310 Post-traumatic stress disorder, unspecified: Secondary | ICD-10-CM | POA: Diagnosis not present

## 2013-03-26 DIAGNOSIS — Z8614 Personal history of Methicillin resistant Staphylococcus aureus infection: Secondary | ICD-10-CM | POA: Diagnosis not present

## 2013-03-27 DIAGNOSIS — R5381 Other malaise: Secondary | ICD-10-CM | POA: Diagnosis not present

## 2013-03-27 DIAGNOSIS — R55 Syncope and collapse: Secondary | ICD-10-CM | POA: Diagnosis not present

## 2013-03-27 DIAGNOSIS — T148XXA Other injury of unspecified body region, initial encounter: Secondary | ICD-10-CM | POA: Diagnosis not present

## 2013-03-27 DIAGNOSIS — R296 Repeated falls: Secondary | ICD-10-CM | POA: Diagnosis not present

## 2013-03-27 DIAGNOSIS — N390 Urinary tract infection, site not specified: Secondary | ICD-10-CM | POA: Diagnosis not present

## 2013-03-27 DIAGNOSIS — G894 Chronic pain syndrome: Secondary | ICD-10-CM | POA: Diagnosis not present

## 2013-03-27 DIAGNOSIS — M549 Dorsalgia, unspecified: Secondary | ICD-10-CM | POA: Diagnosis not present

## 2013-03-27 DIAGNOSIS — I509 Heart failure, unspecified: Secondary | ICD-10-CM | POA: Diagnosis not present

## 2013-03-27 DIAGNOSIS — R5383 Other fatigue: Secondary | ICD-10-CM | POA: Diagnosis not present

## 2013-03-27 DIAGNOSIS — W19XXXA Unspecified fall, initial encounter: Secondary | ICD-10-CM | POA: Diagnosis not present

## 2013-03-27 DIAGNOSIS — N200 Calculus of kidney: Secondary | ICD-10-CM | POA: Diagnosis not present

## 2013-03-27 DIAGNOSIS — G4733 Obstructive sleep apnea (adult) (pediatric): Secondary | ICD-10-CM | POA: Diagnosis not present

## 2013-03-28 DIAGNOSIS — N390 Urinary tract infection, site not specified: Secondary | ICD-10-CM | POA: Diagnosis not present

## 2013-03-28 DIAGNOSIS — W19XXXA Unspecified fall, initial encounter: Secondary | ICD-10-CM | POA: Diagnosis not present

## 2013-03-28 DIAGNOSIS — R5381 Other malaise: Secondary | ICD-10-CM | POA: Diagnosis not present

## 2013-03-28 DIAGNOSIS — G4733 Obstructive sleep apnea (adult) (pediatric): Secondary | ICD-10-CM | POA: Diagnosis not present

## 2013-03-29 DIAGNOSIS — W19XXXA Unspecified fall, initial encounter: Secondary | ICD-10-CM | POA: Diagnosis not present

## 2013-03-29 DIAGNOSIS — R5381 Other malaise: Secondary | ICD-10-CM | POA: Diagnosis not present

## 2013-03-29 DIAGNOSIS — N390 Urinary tract infection, site not specified: Secondary | ICD-10-CM | POA: Diagnosis not present

## 2013-03-29 DIAGNOSIS — G4733 Obstructive sleep apnea (adult) (pediatric): Secondary | ICD-10-CM | POA: Diagnosis not present

## 2013-03-30 DIAGNOSIS — S0003XA Contusion of scalp, initial encounter: Secondary | ICD-10-CM | POA: Diagnosis not present

## 2013-03-30 DIAGNOSIS — N200 Calculus of kidney: Secondary | ICD-10-CM | POA: Diagnosis present

## 2013-03-30 DIAGNOSIS — L8991 Pressure ulcer of unspecified site, stage 1: Secondary | ICD-10-CM | POA: Diagnosis present

## 2013-03-30 DIAGNOSIS — F411 Generalized anxiety disorder: Secondary | ICD-10-CM | POA: Diagnosis present

## 2013-03-30 DIAGNOSIS — Z87442 Personal history of urinary calculi: Secondary | ICD-10-CM | POA: Diagnosis not present

## 2013-03-30 DIAGNOSIS — Z791 Long term (current) use of non-steroidal anti-inflammatories (NSAID): Secondary | ICD-10-CM | POA: Diagnosis not present

## 2013-03-30 DIAGNOSIS — K5900 Constipation, unspecified: Secondary | ICD-10-CM | POA: Diagnosis present

## 2013-03-30 DIAGNOSIS — N390 Urinary tract infection, site not specified: Secondary | ICD-10-CM | POA: Diagnosis not present

## 2013-03-30 DIAGNOSIS — Z9079 Acquired absence of other genital organ(s): Secondary | ICD-10-CM | POA: Diagnosis not present

## 2013-03-30 DIAGNOSIS — Z5189 Encounter for other specified aftercare: Secondary | ICD-10-CM | POA: Diagnosis not present

## 2013-03-30 DIAGNOSIS — J9819 Other pulmonary collapse: Secondary | ICD-10-CM | POA: Diagnosis not present

## 2013-03-30 DIAGNOSIS — W19XXXA Unspecified fall, initial encounter: Secondary | ICD-10-CM | POA: Diagnosis not present

## 2013-03-30 DIAGNOSIS — S1093XA Contusion of unspecified part of neck, initial encounter: Secondary | ICD-10-CM | POA: Diagnosis not present

## 2013-03-30 DIAGNOSIS — Z87440 Personal history of urinary (tract) infections: Secondary | ICD-10-CM | POA: Diagnosis not present

## 2013-03-30 DIAGNOSIS — E669 Obesity, unspecified: Secondary | ICD-10-CM | POA: Diagnosis present

## 2013-03-30 DIAGNOSIS — Z7982 Long term (current) use of aspirin: Secondary | ICD-10-CM | POA: Diagnosis not present

## 2013-03-30 DIAGNOSIS — R5381 Other malaise: Secondary | ICD-10-CM | POA: Diagnosis not present

## 2013-03-30 DIAGNOSIS — K5730 Diverticulosis of large intestine without perforation or abscess without bleeding: Secondary | ICD-10-CM | POA: Diagnosis present

## 2013-03-30 DIAGNOSIS — G4733 Obstructive sleep apnea (adult) (pediatric): Secondary | ICD-10-CM | POA: Diagnosis not present

## 2013-03-30 DIAGNOSIS — M1710 Unilateral primary osteoarthritis, unspecified knee: Secondary | ICD-10-CM | POA: Diagnosis present

## 2013-03-30 DIAGNOSIS — L89109 Pressure ulcer of unspecified part of back, unspecified stage: Secondary | ICD-10-CM | POA: Diagnosis present

## 2013-03-31 DIAGNOSIS — R269 Unspecified abnormalities of gait and mobility: Secondary | ICD-10-CM | POA: Diagnosis not present

## 2013-03-31 DIAGNOSIS — F411 Generalized anxiety disorder: Secondary | ICD-10-CM | POA: Diagnosis not present

## 2013-03-31 DIAGNOSIS — J9819 Other pulmonary collapse: Secondary | ICD-10-CM | POA: Diagnosis not present

## 2013-03-31 DIAGNOSIS — M159 Polyosteoarthritis, unspecified: Secondary | ICD-10-CM | POA: Diagnosis not present

## 2013-03-31 DIAGNOSIS — Z6836 Body mass index (BMI) 36.0-36.9, adult: Secondary | ICD-10-CM | POA: Diagnosis not present

## 2013-03-31 DIAGNOSIS — Z5189 Encounter for other specified aftercare: Secondary | ICD-10-CM | POA: Diagnosis not present

## 2013-03-31 DIAGNOSIS — G4733 Obstructive sleep apnea (adult) (pediatric): Secondary | ICD-10-CM | POA: Diagnosis not present

## 2013-03-31 DIAGNOSIS — N200 Calculus of kidney: Secondary | ICD-10-CM | POA: Diagnosis not present

## 2013-03-31 DIAGNOSIS — R5381 Other malaise: Secondary | ICD-10-CM | POA: Diagnosis not present

## 2013-04-12 DIAGNOSIS — Z466 Encounter for fitting and adjustment of urinary device: Secondary | ICD-10-CM | POA: Diagnosis not present

## 2013-04-12 DIAGNOSIS — F411 Generalized anxiety disorder: Secondary | ICD-10-CM | POA: Diagnosis not present

## 2013-04-12 DIAGNOSIS — R339 Retention of urine, unspecified: Secondary | ICD-10-CM | POA: Diagnosis not present

## 2013-04-12 DIAGNOSIS — M19049 Primary osteoarthritis, unspecified hand: Secondary | ICD-10-CM | POA: Diagnosis not present

## 2013-04-12 DIAGNOSIS — G4730 Sleep apnea, unspecified: Secondary | ICD-10-CM | POA: Diagnosis not present

## 2013-04-12 DIAGNOSIS — R609 Edema, unspecified: Secondary | ICD-10-CM | POA: Diagnosis not present

## 2013-04-12 DIAGNOSIS — S31809A Unspecified open wound of unspecified buttock, initial encounter: Secondary | ICD-10-CM | POA: Diagnosis not present

## 2013-04-13 DIAGNOSIS — Z466 Encounter for fitting and adjustment of urinary device: Secondary | ICD-10-CM | POA: Diagnosis not present

## 2013-04-13 DIAGNOSIS — S31809A Unspecified open wound of unspecified buttock, initial encounter: Secondary | ICD-10-CM | POA: Diagnosis not present

## 2013-04-13 DIAGNOSIS — F411 Generalized anxiety disorder: Secondary | ICD-10-CM | POA: Diagnosis not present

## 2013-04-13 DIAGNOSIS — G4730 Sleep apnea, unspecified: Secondary | ICD-10-CM | POA: Diagnosis not present

## 2013-04-13 DIAGNOSIS — R609 Edema, unspecified: Secondary | ICD-10-CM | POA: Diagnosis not present

## 2013-04-13 DIAGNOSIS — R339 Retention of urine, unspecified: Secondary | ICD-10-CM | POA: Diagnosis not present

## 2013-04-14 DIAGNOSIS — R339 Retention of urine, unspecified: Secondary | ICD-10-CM | POA: Diagnosis not present

## 2013-04-14 DIAGNOSIS — R609 Edema, unspecified: Secondary | ICD-10-CM | POA: Diagnosis not present

## 2013-04-14 DIAGNOSIS — Z466 Encounter for fitting and adjustment of urinary device: Secondary | ICD-10-CM | POA: Diagnosis not present

## 2013-04-14 DIAGNOSIS — S31809A Unspecified open wound of unspecified buttock, initial encounter: Secondary | ICD-10-CM | POA: Diagnosis not present

## 2013-04-14 DIAGNOSIS — F411 Generalized anxiety disorder: Secondary | ICD-10-CM | POA: Diagnosis not present

## 2013-04-14 DIAGNOSIS — G4730 Sleep apnea, unspecified: Secondary | ICD-10-CM | POA: Diagnosis not present

## 2013-04-15 DIAGNOSIS — R339 Retention of urine, unspecified: Secondary | ICD-10-CM | POA: Diagnosis not present

## 2013-04-15 DIAGNOSIS — R609 Edema, unspecified: Secondary | ICD-10-CM | POA: Diagnosis not present

## 2013-04-15 DIAGNOSIS — G4730 Sleep apnea, unspecified: Secondary | ICD-10-CM | POA: Diagnosis not present

## 2013-04-15 DIAGNOSIS — Z466 Encounter for fitting and adjustment of urinary device: Secondary | ICD-10-CM | POA: Diagnosis not present

## 2013-04-15 DIAGNOSIS — F411 Generalized anxiety disorder: Secondary | ICD-10-CM | POA: Diagnosis not present

## 2013-04-15 DIAGNOSIS — S31809A Unspecified open wound of unspecified buttock, initial encounter: Secondary | ICD-10-CM | POA: Diagnosis not present

## 2013-04-16 DIAGNOSIS — S31809A Unspecified open wound of unspecified buttock, initial encounter: Secondary | ICD-10-CM | POA: Diagnosis not present

## 2013-04-16 DIAGNOSIS — G4730 Sleep apnea, unspecified: Secondary | ICD-10-CM | POA: Diagnosis not present

## 2013-04-16 DIAGNOSIS — R609 Edema, unspecified: Secondary | ICD-10-CM | POA: Diagnosis not present

## 2013-04-16 DIAGNOSIS — F411 Generalized anxiety disorder: Secondary | ICD-10-CM | POA: Diagnosis not present

## 2013-04-16 DIAGNOSIS — R339 Retention of urine, unspecified: Secondary | ICD-10-CM | POA: Diagnosis not present

## 2013-04-16 DIAGNOSIS — Z466 Encounter for fitting and adjustment of urinary device: Secondary | ICD-10-CM | POA: Diagnosis not present

## 2013-04-17 DIAGNOSIS — R609 Edema, unspecified: Secondary | ICD-10-CM | POA: Diagnosis not present

## 2013-04-17 DIAGNOSIS — F411 Generalized anxiety disorder: Secondary | ICD-10-CM | POA: Diagnosis not present

## 2013-04-17 DIAGNOSIS — R339 Retention of urine, unspecified: Secondary | ICD-10-CM | POA: Diagnosis not present

## 2013-04-17 DIAGNOSIS — S31809A Unspecified open wound of unspecified buttock, initial encounter: Secondary | ICD-10-CM | POA: Diagnosis not present

## 2013-04-17 DIAGNOSIS — G4730 Sleep apnea, unspecified: Secondary | ICD-10-CM | POA: Diagnosis not present

## 2013-04-17 DIAGNOSIS — Z466 Encounter for fitting and adjustment of urinary device: Secondary | ICD-10-CM | POA: Diagnosis not present

## 2013-04-18 DIAGNOSIS — R339 Retention of urine, unspecified: Secondary | ICD-10-CM | POA: Diagnosis not present

## 2013-04-18 DIAGNOSIS — F411 Generalized anxiety disorder: Secondary | ICD-10-CM | POA: Diagnosis not present

## 2013-04-18 DIAGNOSIS — Z466 Encounter for fitting and adjustment of urinary device: Secondary | ICD-10-CM | POA: Diagnosis not present

## 2013-04-18 DIAGNOSIS — R609 Edema, unspecified: Secondary | ICD-10-CM | POA: Diagnosis not present

## 2013-04-18 DIAGNOSIS — G4730 Sleep apnea, unspecified: Secondary | ICD-10-CM | POA: Diagnosis not present

## 2013-04-18 DIAGNOSIS — S31809A Unspecified open wound of unspecified buttock, initial encounter: Secondary | ICD-10-CM | POA: Diagnosis not present

## 2013-04-19 DIAGNOSIS — S31809A Unspecified open wound of unspecified buttock, initial encounter: Secondary | ICD-10-CM | POA: Diagnosis not present

## 2013-04-19 DIAGNOSIS — R609 Edema, unspecified: Secondary | ICD-10-CM | POA: Diagnosis not present

## 2013-04-19 DIAGNOSIS — R339 Retention of urine, unspecified: Secondary | ICD-10-CM | POA: Diagnosis not present

## 2013-04-19 DIAGNOSIS — F411 Generalized anxiety disorder: Secondary | ICD-10-CM | POA: Diagnosis not present

## 2013-04-19 DIAGNOSIS — G4730 Sleep apnea, unspecified: Secondary | ICD-10-CM | POA: Diagnosis not present

## 2013-04-19 DIAGNOSIS — Z466 Encounter for fitting and adjustment of urinary device: Secondary | ICD-10-CM | POA: Diagnosis not present

## 2013-04-20 DIAGNOSIS — F411 Generalized anxiety disorder: Secondary | ICD-10-CM | POA: Diagnosis not present

## 2013-04-20 DIAGNOSIS — S31809A Unspecified open wound of unspecified buttock, initial encounter: Secondary | ICD-10-CM | POA: Diagnosis not present

## 2013-04-20 DIAGNOSIS — R339 Retention of urine, unspecified: Secondary | ICD-10-CM | POA: Diagnosis not present

## 2013-04-20 DIAGNOSIS — Z466 Encounter for fitting and adjustment of urinary device: Secondary | ICD-10-CM | POA: Diagnosis not present

## 2013-04-20 DIAGNOSIS — R609 Edema, unspecified: Secondary | ICD-10-CM | POA: Diagnosis not present

## 2013-04-20 DIAGNOSIS — G4730 Sleep apnea, unspecified: Secondary | ICD-10-CM | POA: Diagnosis not present

## 2013-04-21 DIAGNOSIS — K5900 Constipation, unspecified: Secondary | ICD-10-CM | POA: Diagnosis not present

## 2013-04-21 DIAGNOSIS — G4700 Insomnia, unspecified: Secondary | ICD-10-CM | POA: Diagnosis not present

## 2013-04-21 DIAGNOSIS — Z9350 Unspecified cystostomy status: Secondary | ICD-10-CM | POA: Diagnosis not present

## 2013-04-21 DIAGNOSIS — R339 Retention of urine, unspecified: Secondary | ICD-10-CM | POA: Diagnosis not present

## 2013-04-21 DIAGNOSIS — S31809A Unspecified open wound of unspecified buttock, initial encounter: Secondary | ICD-10-CM | POA: Diagnosis not present

## 2013-04-21 DIAGNOSIS — R609 Edema, unspecified: Secondary | ICD-10-CM | POA: Diagnosis not present

## 2013-04-21 DIAGNOSIS — Z466 Encounter for fitting and adjustment of urinary device: Secondary | ICD-10-CM | POA: Diagnosis not present

## 2013-04-21 DIAGNOSIS — F411 Generalized anxiety disorder: Secondary | ICD-10-CM | POA: Diagnosis not present

## 2013-04-21 DIAGNOSIS — G4730 Sleep apnea, unspecified: Secondary | ICD-10-CM | POA: Diagnosis not present

## 2013-04-22 DIAGNOSIS — G4730 Sleep apnea, unspecified: Secondary | ICD-10-CM | POA: Diagnosis not present

## 2013-04-22 DIAGNOSIS — Z466 Encounter for fitting and adjustment of urinary device: Secondary | ICD-10-CM | POA: Diagnosis not present

## 2013-04-22 DIAGNOSIS — F411 Generalized anxiety disorder: Secondary | ICD-10-CM | POA: Diagnosis not present

## 2013-04-22 DIAGNOSIS — R609 Edema, unspecified: Secondary | ICD-10-CM | POA: Diagnosis not present

## 2013-04-22 DIAGNOSIS — R339 Retention of urine, unspecified: Secondary | ICD-10-CM | POA: Diagnosis not present

## 2013-04-22 DIAGNOSIS — S31809A Unspecified open wound of unspecified buttock, initial encounter: Secondary | ICD-10-CM | POA: Diagnosis not present

## 2013-04-23 DIAGNOSIS — S31809A Unspecified open wound of unspecified buttock, initial encounter: Secondary | ICD-10-CM | POA: Diagnosis not present

## 2013-04-23 DIAGNOSIS — R339 Retention of urine, unspecified: Secondary | ICD-10-CM | POA: Diagnosis not present

## 2013-04-23 DIAGNOSIS — R609 Edema, unspecified: Secondary | ICD-10-CM | POA: Diagnosis not present

## 2013-04-23 DIAGNOSIS — F411 Generalized anxiety disorder: Secondary | ICD-10-CM | POA: Diagnosis not present

## 2013-04-23 DIAGNOSIS — G4730 Sleep apnea, unspecified: Secondary | ICD-10-CM | POA: Diagnosis not present

## 2013-04-23 DIAGNOSIS — Z466 Encounter for fitting and adjustment of urinary device: Secondary | ICD-10-CM | POA: Diagnosis not present

## 2013-04-24 DIAGNOSIS — F411 Generalized anxiety disorder: Secondary | ICD-10-CM | POA: Diagnosis not present

## 2013-04-24 DIAGNOSIS — Z466 Encounter for fitting and adjustment of urinary device: Secondary | ICD-10-CM | POA: Diagnosis not present

## 2013-04-24 DIAGNOSIS — R609 Edema, unspecified: Secondary | ICD-10-CM | POA: Diagnosis not present

## 2013-04-24 DIAGNOSIS — R339 Retention of urine, unspecified: Secondary | ICD-10-CM | POA: Diagnosis not present

## 2013-04-24 DIAGNOSIS — S31809A Unspecified open wound of unspecified buttock, initial encounter: Secondary | ICD-10-CM | POA: Diagnosis not present

## 2013-04-24 DIAGNOSIS — G4730 Sleep apnea, unspecified: Secondary | ICD-10-CM | POA: Diagnosis not present

## 2013-04-25 DIAGNOSIS — R609 Edema, unspecified: Secondary | ICD-10-CM | POA: Diagnosis not present

## 2013-04-25 DIAGNOSIS — R339 Retention of urine, unspecified: Secondary | ICD-10-CM | POA: Diagnosis not present

## 2013-04-25 DIAGNOSIS — S31809A Unspecified open wound of unspecified buttock, initial encounter: Secondary | ICD-10-CM | POA: Diagnosis not present

## 2013-04-25 DIAGNOSIS — Z466 Encounter for fitting and adjustment of urinary device: Secondary | ICD-10-CM | POA: Diagnosis not present

## 2013-04-25 DIAGNOSIS — F411 Generalized anxiety disorder: Secondary | ICD-10-CM | POA: Diagnosis not present

## 2013-04-25 DIAGNOSIS — G4730 Sleep apnea, unspecified: Secondary | ICD-10-CM | POA: Diagnosis not present

## 2013-04-28 DIAGNOSIS — F411 Generalized anxiety disorder: Secondary | ICD-10-CM | POA: Diagnosis not present

## 2013-04-28 DIAGNOSIS — R609 Edema, unspecified: Secondary | ICD-10-CM | POA: Diagnosis not present

## 2013-04-28 DIAGNOSIS — G4730 Sleep apnea, unspecified: Secondary | ICD-10-CM | POA: Diagnosis not present

## 2013-04-28 DIAGNOSIS — Z466 Encounter for fitting and adjustment of urinary device: Secondary | ICD-10-CM | POA: Diagnosis not present

## 2013-04-28 DIAGNOSIS — R339 Retention of urine, unspecified: Secondary | ICD-10-CM | POA: Diagnosis not present

## 2013-04-28 DIAGNOSIS — S31809A Unspecified open wound of unspecified buttock, initial encounter: Secondary | ICD-10-CM | POA: Diagnosis not present

## 2013-04-29 DIAGNOSIS — R339 Retention of urine, unspecified: Secondary | ICD-10-CM | POA: Diagnosis not present

## 2013-04-29 DIAGNOSIS — Z466 Encounter for fitting and adjustment of urinary device: Secondary | ICD-10-CM | POA: Diagnosis not present

## 2013-04-29 DIAGNOSIS — R609 Edema, unspecified: Secondary | ICD-10-CM | POA: Diagnosis not present

## 2013-04-29 DIAGNOSIS — S31809A Unspecified open wound of unspecified buttock, initial encounter: Secondary | ICD-10-CM | POA: Diagnosis not present

## 2013-04-29 DIAGNOSIS — F411 Generalized anxiety disorder: Secondary | ICD-10-CM | POA: Diagnosis not present

## 2013-04-29 DIAGNOSIS — G4730 Sleep apnea, unspecified: Secondary | ICD-10-CM | POA: Diagnosis not present

## 2013-04-30 DIAGNOSIS — F411 Generalized anxiety disorder: Secondary | ICD-10-CM | POA: Diagnosis not present

## 2013-04-30 DIAGNOSIS — G4730 Sleep apnea, unspecified: Secondary | ICD-10-CM | POA: Diagnosis not present

## 2013-04-30 DIAGNOSIS — Z466 Encounter for fitting and adjustment of urinary device: Secondary | ICD-10-CM | POA: Diagnosis not present

## 2013-04-30 DIAGNOSIS — R339 Retention of urine, unspecified: Secondary | ICD-10-CM | POA: Diagnosis not present

## 2013-04-30 DIAGNOSIS — R609 Edema, unspecified: Secondary | ICD-10-CM | POA: Diagnosis not present

## 2013-04-30 DIAGNOSIS — S31809A Unspecified open wound of unspecified buttock, initial encounter: Secondary | ICD-10-CM | POA: Diagnosis not present

## 2013-05-01 DIAGNOSIS — G4730 Sleep apnea, unspecified: Secondary | ICD-10-CM | POA: Diagnosis not present

## 2013-05-01 DIAGNOSIS — S31809A Unspecified open wound of unspecified buttock, initial encounter: Secondary | ICD-10-CM | POA: Diagnosis not present

## 2013-05-01 DIAGNOSIS — Z466 Encounter for fitting and adjustment of urinary device: Secondary | ICD-10-CM | POA: Diagnosis not present

## 2013-05-01 DIAGNOSIS — R609 Edema, unspecified: Secondary | ICD-10-CM | POA: Diagnosis not present

## 2013-05-01 DIAGNOSIS — F411 Generalized anxiety disorder: Secondary | ICD-10-CM | POA: Diagnosis not present

## 2013-05-01 DIAGNOSIS — R339 Retention of urine, unspecified: Secondary | ICD-10-CM | POA: Diagnosis not present

## 2013-05-02 DIAGNOSIS — S31809A Unspecified open wound of unspecified buttock, initial encounter: Secondary | ICD-10-CM | POA: Diagnosis not present

## 2013-05-02 DIAGNOSIS — R609 Edema, unspecified: Secondary | ICD-10-CM | POA: Diagnosis not present

## 2013-05-02 DIAGNOSIS — Z466 Encounter for fitting and adjustment of urinary device: Secondary | ICD-10-CM | POA: Diagnosis not present

## 2013-05-02 DIAGNOSIS — R339 Retention of urine, unspecified: Secondary | ICD-10-CM | POA: Diagnosis not present

## 2013-05-02 DIAGNOSIS — G4730 Sleep apnea, unspecified: Secondary | ICD-10-CM | POA: Diagnosis not present

## 2013-05-02 DIAGNOSIS — F411 Generalized anxiety disorder: Secondary | ICD-10-CM | POA: Diagnosis not present

## 2013-05-05 DIAGNOSIS — R609 Edema, unspecified: Secondary | ICD-10-CM | POA: Diagnosis not present

## 2013-05-05 DIAGNOSIS — S31809A Unspecified open wound of unspecified buttock, initial encounter: Secondary | ICD-10-CM | POA: Diagnosis not present

## 2013-05-05 DIAGNOSIS — G4730 Sleep apnea, unspecified: Secondary | ICD-10-CM | POA: Diagnosis not present

## 2013-05-05 DIAGNOSIS — R339 Retention of urine, unspecified: Secondary | ICD-10-CM | POA: Diagnosis not present

## 2013-05-05 DIAGNOSIS — Z466 Encounter for fitting and adjustment of urinary device: Secondary | ICD-10-CM | POA: Diagnosis not present

## 2013-05-05 DIAGNOSIS — F411 Generalized anxiety disorder: Secondary | ICD-10-CM | POA: Diagnosis not present

## 2013-05-06 DIAGNOSIS — Z466 Encounter for fitting and adjustment of urinary device: Secondary | ICD-10-CM | POA: Diagnosis not present

## 2013-05-06 DIAGNOSIS — S31809A Unspecified open wound of unspecified buttock, initial encounter: Secondary | ICD-10-CM | POA: Diagnosis not present

## 2013-05-06 DIAGNOSIS — R339 Retention of urine, unspecified: Secondary | ICD-10-CM | POA: Diagnosis not present

## 2013-05-06 DIAGNOSIS — G4730 Sleep apnea, unspecified: Secondary | ICD-10-CM | POA: Diagnosis not present

## 2013-05-06 DIAGNOSIS — F411 Generalized anxiety disorder: Secondary | ICD-10-CM | POA: Diagnosis not present

## 2013-05-06 DIAGNOSIS — R609 Edema, unspecified: Secondary | ICD-10-CM | POA: Diagnosis not present

## 2013-05-07 DIAGNOSIS — R609 Edema, unspecified: Secondary | ICD-10-CM | POA: Diagnosis not present

## 2013-05-07 DIAGNOSIS — R339 Retention of urine, unspecified: Secondary | ICD-10-CM | POA: Diagnosis not present

## 2013-05-07 DIAGNOSIS — S31809A Unspecified open wound of unspecified buttock, initial encounter: Secondary | ICD-10-CM | POA: Diagnosis not present

## 2013-05-07 DIAGNOSIS — G4730 Sleep apnea, unspecified: Secondary | ICD-10-CM | POA: Diagnosis not present

## 2013-05-07 DIAGNOSIS — F411 Generalized anxiety disorder: Secondary | ICD-10-CM | POA: Diagnosis not present

## 2013-05-07 DIAGNOSIS — Z466 Encounter for fitting and adjustment of urinary device: Secondary | ICD-10-CM | POA: Diagnosis not present

## 2013-05-08 DIAGNOSIS — G4730 Sleep apnea, unspecified: Secondary | ICD-10-CM | POA: Diagnosis not present

## 2013-05-08 DIAGNOSIS — Z466 Encounter for fitting and adjustment of urinary device: Secondary | ICD-10-CM | POA: Diagnosis not present

## 2013-05-08 DIAGNOSIS — R339 Retention of urine, unspecified: Secondary | ICD-10-CM | POA: Diagnosis not present

## 2013-05-08 DIAGNOSIS — R609 Edema, unspecified: Secondary | ICD-10-CM | POA: Diagnosis not present

## 2013-05-08 DIAGNOSIS — S31809A Unspecified open wound of unspecified buttock, initial encounter: Secondary | ICD-10-CM | POA: Diagnosis not present

## 2013-05-08 DIAGNOSIS — F411 Generalized anxiety disorder: Secondary | ICD-10-CM | POA: Diagnosis not present

## 2013-05-09 DIAGNOSIS — F411 Generalized anxiety disorder: Secondary | ICD-10-CM | POA: Diagnosis not present

## 2013-05-09 DIAGNOSIS — R339 Retention of urine, unspecified: Secondary | ICD-10-CM | POA: Diagnosis not present

## 2013-05-09 DIAGNOSIS — R609 Edema, unspecified: Secondary | ICD-10-CM | POA: Diagnosis not present

## 2013-05-09 DIAGNOSIS — S31809A Unspecified open wound of unspecified buttock, initial encounter: Secondary | ICD-10-CM | POA: Diagnosis not present

## 2013-05-09 DIAGNOSIS — Z466 Encounter for fitting and adjustment of urinary device: Secondary | ICD-10-CM | POA: Diagnosis not present

## 2013-05-09 DIAGNOSIS — G4730 Sleep apnea, unspecified: Secondary | ICD-10-CM | POA: Diagnosis not present

## 2013-05-12 DIAGNOSIS — F411 Generalized anxiety disorder: Secondary | ICD-10-CM | POA: Diagnosis not present

## 2013-05-12 DIAGNOSIS — S31809A Unspecified open wound of unspecified buttock, initial encounter: Secondary | ICD-10-CM | POA: Diagnosis not present

## 2013-05-12 DIAGNOSIS — R339 Retention of urine, unspecified: Secondary | ICD-10-CM | POA: Diagnosis not present

## 2013-05-12 DIAGNOSIS — R609 Edema, unspecified: Secondary | ICD-10-CM | POA: Diagnosis not present

## 2013-05-12 DIAGNOSIS — G4730 Sleep apnea, unspecified: Secondary | ICD-10-CM | POA: Diagnosis not present

## 2013-05-12 DIAGNOSIS — Z466 Encounter for fitting and adjustment of urinary device: Secondary | ICD-10-CM | POA: Diagnosis not present

## 2013-05-14 DIAGNOSIS — S31809A Unspecified open wound of unspecified buttock, initial encounter: Secondary | ICD-10-CM | POA: Diagnosis not present

## 2013-05-14 DIAGNOSIS — Z466 Encounter for fitting and adjustment of urinary device: Secondary | ICD-10-CM | POA: Diagnosis not present

## 2013-05-14 DIAGNOSIS — R5381 Other malaise: Secondary | ICD-10-CM | POA: Diagnosis not present

## 2013-05-14 DIAGNOSIS — F411 Generalized anxiety disorder: Secondary | ICD-10-CM | POA: Diagnosis not present

## 2013-05-14 DIAGNOSIS — R339 Retention of urine, unspecified: Secondary | ICD-10-CM | POA: Diagnosis not present

## 2013-05-14 DIAGNOSIS — R609 Edema, unspecified: Secondary | ICD-10-CM | POA: Diagnosis not present

## 2013-05-14 DIAGNOSIS — G4730 Sleep apnea, unspecified: Secondary | ICD-10-CM | POA: Diagnosis not present

## 2013-05-15 DIAGNOSIS — G4730 Sleep apnea, unspecified: Secondary | ICD-10-CM | POA: Diagnosis not present

## 2013-05-15 DIAGNOSIS — F411 Generalized anxiety disorder: Secondary | ICD-10-CM | POA: Diagnosis not present

## 2013-05-15 DIAGNOSIS — R609 Edema, unspecified: Secondary | ICD-10-CM | POA: Diagnosis not present

## 2013-05-15 DIAGNOSIS — Z466 Encounter for fitting and adjustment of urinary device: Secondary | ICD-10-CM | POA: Diagnosis not present

## 2013-05-15 DIAGNOSIS — N390 Urinary tract infection, site not specified: Secondary | ICD-10-CM | POA: Diagnosis not present

## 2013-05-15 DIAGNOSIS — S31809A Unspecified open wound of unspecified buttock, initial encounter: Secondary | ICD-10-CM | POA: Diagnosis not present

## 2013-05-15 DIAGNOSIS — R339 Retention of urine, unspecified: Secondary | ICD-10-CM | POA: Diagnosis not present

## 2013-05-16 DIAGNOSIS — S31809A Unspecified open wound of unspecified buttock, initial encounter: Secondary | ICD-10-CM | POA: Diagnosis not present

## 2013-05-16 DIAGNOSIS — G4730 Sleep apnea, unspecified: Secondary | ICD-10-CM | POA: Diagnosis not present

## 2013-05-16 DIAGNOSIS — R339 Retention of urine, unspecified: Secondary | ICD-10-CM | POA: Diagnosis not present

## 2013-05-16 DIAGNOSIS — Z466 Encounter for fitting and adjustment of urinary device: Secondary | ICD-10-CM | POA: Diagnosis not present

## 2013-05-16 DIAGNOSIS — F411 Generalized anxiety disorder: Secondary | ICD-10-CM | POA: Diagnosis not present

## 2013-05-16 DIAGNOSIS — R609 Edema, unspecified: Secondary | ICD-10-CM | POA: Diagnosis not present

## 2013-05-17 DIAGNOSIS — R339 Retention of urine, unspecified: Secondary | ICD-10-CM | POA: Diagnosis not present

## 2013-05-17 DIAGNOSIS — S31809A Unspecified open wound of unspecified buttock, initial encounter: Secondary | ICD-10-CM | POA: Diagnosis not present

## 2013-05-17 DIAGNOSIS — G4730 Sleep apnea, unspecified: Secondary | ICD-10-CM | POA: Diagnosis not present

## 2013-05-17 DIAGNOSIS — F411 Generalized anxiety disorder: Secondary | ICD-10-CM | POA: Diagnosis not present

## 2013-05-17 DIAGNOSIS — Z466 Encounter for fitting and adjustment of urinary device: Secondary | ICD-10-CM | POA: Diagnosis not present

## 2013-05-17 DIAGNOSIS — R609 Edema, unspecified: Secondary | ICD-10-CM | POA: Diagnosis not present

## 2013-05-19 DIAGNOSIS — Z466 Encounter for fitting and adjustment of urinary device: Secondary | ICD-10-CM | POA: Diagnosis not present

## 2013-05-19 DIAGNOSIS — G4730 Sleep apnea, unspecified: Secondary | ICD-10-CM | POA: Diagnosis not present

## 2013-05-19 DIAGNOSIS — F411 Generalized anxiety disorder: Secondary | ICD-10-CM | POA: Diagnosis not present

## 2013-05-19 DIAGNOSIS — R609 Edema, unspecified: Secondary | ICD-10-CM | POA: Diagnosis not present

## 2013-05-19 DIAGNOSIS — R339 Retention of urine, unspecified: Secondary | ICD-10-CM | POA: Diagnosis not present

## 2013-05-19 DIAGNOSIS — S31809A Unspecified open wound of unspecified buttock, initial encounter: Secondary | ICD-10-CM | POA: Diagnosis not present

## 2013-05-20 DIAGNOSIS — R339 Retention of urine, unspecified: Secondary | ICD-10-CM | POA: Diagnosis not present

## 2013-05-20 DIAGNOSIS — Z466 Encounter for fitting and adjustment of urinary device: Secondary | ICD-10-CM | POA: Diagnosis not present

## 2013-05-20 DIAGNOSIS — G4730 Sleep apnea, unspecified: Secondary | ICD-10-CM | POA: Diagnosis not present

## 2013-05-20 DIAGNOSIS — F411 Generalized anxiety disorder: Secondary | ICD-10-CM | POA: Diagnosis not present

## 2013-05-20 DIAGNOSIS — S31809A Unspecified open wound of unspecified buttock, initial encounter: Secondary | ICD-10-CM | POA: Diagnosis not present

## 2013-05-20 DIAGNOSIS — R609 Edema, unspecified: Secondary | ICD-10-CM | POA: Diagnosis not present

## 2013-05-21 DIAGNOSIS — S31809A Unspecified open wound of unspecified buttock, initial encounter: Secondary | ICD-10-CM | POA: Diagnosis not present

## 2013-05-21 DIAGNOSIS — F411 Generalized anxiety disorder: Secondary | ICD-10-CM | POA: Diagnosis not present

## 2013-05-21 DIAGNOSIS — Z466 Encounter for fitting and adjustment of urinary device: Secondary | ICD-10-CM | POA: Diagnosis not present

## 2013-05-21 DIAGNOSIS — G4730 Sleep apnea, unspecified: Secondary | ICD-10-CM | POA: Diagnosis not present

## 2013-05-21 DIAGNOSIS — R339 Retention of urine, unspecified: Secondary | ICD-10-CM | POA: Diagnosis not present

## 2013-05-21 DIAGNOSIS — R609 Edema, unspecified: Secondary | ICD-10-CM | POA: Diagnosis not present

## 2013-05-22 DIAGNOSIS — S31809A Unspecified open wound of unspecified buttock, initial encounter: Secondary | ICD-10-CM | POA: Diagnosis not present

## 2013-05-22 DIAGNOSIS — G4730 Sleep apnea, unspecified: Secondary | ICD-10-CM | POA: Diagnosis not present

## 2013-05-22 DIAGNOSIS — Z466 Encounter for fitting and adjustment of urinary device: Secondary | ICD-10-CM | POA: Diagnosis not present

## 2013-05-22 DIAGNOSIS — R609 Edema, unspecified: Secondary | ICD-10-CM | POA: Diagnosis not present

## 2013-05-22 DIAGNOSIS — R339 Retention of urine, unspecified: Secondary | ICD-10-CM | POA: Diagnosis not present

## 2013-05-22 DIAGNOSIS — F411 Generalized anxiety disorder: Secondary | ICD-10-CM | POA: Diagnosis not present

## 2013-05-23 DIAGNOSIS — R609 Edema, unspecified: Secondary | ICD-10-CM | POA: Diagnosis not present

## 2013-05-23 DIAGNOSIS — Z466 Encounter for fitting and adjustment of urinary device: Secondary | ICD-10-CM | POA: Diagnosis not present

## 2013-05-23 DIAGNOSIS — R339 Retention of urine, unspecified: Secondary | ICD-10-CM | POA: Diagnosis not present

## 2013-05-23 DIAGNOSIS — F411 Generalized anxiety disorder: Secondary | ICD-10-CM | POA: Diagnosis not present

## 2013-05-23 DIAGNOSIS — G4730 Sleep apnea, unspecified: Secondary | ICD-10-CM | POA: Diagnosis not present

## 2013-05-23 DIAGNOSIS — S31809A Unspecified open wound of unspecified buttock, initial encounter: Secondary | ICD-10-CM | POA: Diagnosis not present

## 2013-05-26 DIAGNOSIS — Z466 Encounter for fitting and adjustment of urinary device: Secondary | ICD-10-CM | POA: Diagnosis not present

## 2013-05-26 DIAGNOSIS — F411 Generalized anxiety disorder: Secondary | ICD-10-CM | POA: Diagnosis not present

## 2013-05-26 DIAGNOSIS — R339 Retention of urine, unspecified: Secondary | ICD-10-CM | POA: Diagnosis not present

## 2013-05-26 DIAGNOSIS — G4730 Sleep apnea, unspecified: Secondary | ICD-10-CM | POA: Diagnosis not present

## 2013-05-26 DIAGNOSIS — R609 Edema, unspecified: Secondary | ICD-10-CM | POA: Diagnosis not present

## 2013-05-26 DIAGNOSIS — S31809A Unspecified open wound of unspecified buttock, initial encounter: Secondary | ICD-10-CM | POA: Diagnosis not present

## 2013-05-27 DIAGNOSIS — R609 Edema, unspecified: Secondary | ICD-10-CM | POA: Diagnosis not present

## 2013-05-27 DIAGNOSIS — G4730 Sleep apnea, unspecified: Secondary | ICD-10-CM | POA: Diagnosis not present

## 2013-05-27 DIAGNOSIS — F411 Generalized anxiety disorder: Secondary | ICD-10-CM | POA: Diagnosis not present

## 2013-05-27 DIAGNOSIS — S31809A Unspecified open wound of unspecified buttock, initial encounter: Secondary | ICD-10-CM | POA: Diagnosis not present

## 2013-05-27 DIAGNOSIS — R339 Retention of urine, unspecified: Secondary | ICD-10-CM | POA: Diagnosis not present

## 2013-05-27 DIAGNOSIS — Z466 Encounter for fitting and adjustment of urinary device: Secondary | ICD-10-CM | POA: Diagnosis not present

## 2013-05-28 DIAGNOSIS — R339 Retention of urine, unspecified: Secondary | ICD-10-CM | POA: Diagnosis not present

## 2013-05-28 DIAGNOSIS — R609 Edema, unspecified: Secondary | ICD-10-CM | POA: Diagnosis not present

## 2013-05-28 DIAGNOSIS — G4730 Sleep apnea, unspecified: Secondary | ICD-10-CM | POA: Diagnosis not present

## 2013-05-28 DIAGNOSIS — S31809A Unspecified open wound of unspecified buttock, initial encounter: Secondary | ICD-10-CM | POA: Diagnosis not present

## 2013-05-28 DIAGNOSIS — F411 Generalized anxiety disorder: Secondary | ICD-10-CM | POA: Diagnosis not present

## 2013-05-28 DIAGNOSIS — Z466 Encounter for fitting and adjustment of urinary device: Secondary | ICD-10-CM | POA: Diagnosis not present

## 2013-05-29 DIAGNOSIS — R339 Retention of urine, unspecified: Secondary | ICD-10-CM | POA: Diagnosis not present

## 2013-05-29 DIAGNOSIS — G4730 Sleep apnea, unspecified: Secondary | ICD-10-CM | POA: Diagnosis not present

## 2013-05-29 DIAGNOSIS — R609 Edema, unspecified: Secondary | ICD-10-CM | POA: Diagnosis not present

## 2013-05-29 DIAGNOSIS — S31809A Unspecified open wound of unspecified buttock, initial encounter: Secondary | ICD-10-CM | POA: Diagnosis not present

## 2013-05-29 DIAGNOSIS — Z466 Encounter for fitting and adjustment of urinary device: Secondary | ICD-10-CM | POA: Diagnosis not present

## 2013-05-29 DIAGNOSIS — F411 Generalized anxiety disorder: Secondary | ICD-10-CM | POA: Diagnosis not present

## 2013-05-30 DIAGNOSIS — G4730 Sleep apnea, unspecified: Secondary | ICD-10-CM | POA: Diagnosis not present

## 2013-05-30 DIAGNOSIS — S31809A Unspecified open wound of unspecified buttock, initial encounter: Secondary | ICD-10-CM | POA: Diagnosis not present

## 2013-05-30 DIAGNOSIS — Z466 Encounter for fitting and adjustment of urinary device: Secondary | ICD-10-CM | POA: Diagnosis not present

## 2013-05-30 DIAGNOSIS — F411 Generalized anxiety disorder: Secondary | ICD-10-CM | POA: Diagnosis not present

## 2013-05-30 DIAGNOSIS — R339 Retention of urine, unspecified: Secondary | ICD-10-CM | POA: Diagnosis not present

## 2013-05-30 DIAGNOSIS — R609 Edema, unspecified: Secondary | ICD-10-CM | POA: Diagnosis not present

## 2013-06-02 DIAGNOSIS — Z466 Encounter for fitting and adjustment of urinary device: Secondary | ICD-10-CM | POA: Diagnosis not present

## 2013-06-02 DIAGNOSIS — G4730 Sleep apnea, unspecified: Secondary | ICD-10-CM | POA: Diagnosis not present

## 2013-06-02 DIAGNOSIS — R339 Retention of urine, unspecified: Secondary | ICD-10-CM | POA: Diagnosis not present

## 2013-06-02 DIAGNOSIS — F411 Generalized anxiety disorder: Secondary | ICD-10-CM | POA: Diagnosis not present

## 2013-06-02 DIAGNOSIS — N390 Urinary tract infection, site not specified: Secondary | ICD-10-CM | POA: Diagnosis not present

## 2013-06-02 DIAGNOSIS — S31809A Unspecified open wound of unspecified buttock, initial encounter: Secondary | ICD-10-CM | POA: Diagnosis not present

## 2013-06-02 DIAGNOSIS — R609 Edema, unspecified: Secondary | ICD-10-CM | POA: Diagnosis not present

## 2013-06-03 DIAGNOSIS — R609 Edema, unspecified: Secondary | ICD-10-CM | POA: Diagnosis not present

## 2013-06-03 DIAGNOSIS — Z466 Encounter for fitting and adjustment of urinary device: Secondary | ICD-10-CM | POA: Diagnosis not present

## 2013-06-03 DIAGNOSIS — S31809A Unspecified open wound of unspecified buttock, initial encounter: Secondary | ICD-10-CM | POA: Diagnosis not present

## 2013-06-03 DIAGNOSIS — R339 Retention of urine, unspecified: Secondary | ICD-10-CM | POA: Diagnosis not present

## 2013-06-03 DIAGNOSIS — G4730 Sleep apnea, unspecified: Secondary | ICD-10-CM | POA: Diagnosis not present

## 2013-06-03 DIAGNOSIS — F411 Generalized anxiety disorder: Secondary | ICD-10-CM | POA: Diagnosis not present

## 2013-06-04 DIAGNOSIS — S31809A Unspecified open wound of unspecified buttock, initial encounter: Secondary | ICD-10-CM | POA: Diagnosis not present

## 2013-06-04 DIAGNOSIS — G4730 Sleep apnea, unspecified: Secondary | ICD-10-CM | POA: Diagnosis not present

## 2013-06-04 DIAGNOSIS — Z466 Encounter for fitting and adjustment of urinary device: Secondary | ICD-10-CM | POA: Diagnosis not present

## 2013-06-04 DIAGNOSIS — R609 Edema, unspecified: Secondary | ICD-10-CM | POA: Diagnosis not present

## 2013-06-04 DIAGNOSIS — R339 Retention of urine, unspecified: Secondary | ICD-10-CM | POA: Diagnosis not present

## 2013-06-04 DIAGNOSIS — F411 Generalized anxiety disorder: Secondary | ICD-10-CM | POA: Diagnosis not present

## 2013-06-05 DIAGNOSIS — F411 Generalized anxiety disorder: Secondary | ICD-10-CM | POA: Diagnosis not present

## 2013-06-05 DIAGNOSIS — R609 Edema, unspecified: Secondary | ICD-10-CM | POA: Diagnosis not present

## 2013-06-05 DIAGNOSIS — R339 Retention of urine, unspecified: Secondary | ICD-10-CM | POA: Diagnosis not present

## 2013-06-05 DIAGNOSIS — Z466 Encounter for fitting and adjustment of urinary device: Secondary | ICD-10-CM | POA: Diagnosis not present

## 2013-06-05 DIAGNOSIS — G4730 Sleep apnea, unspecified: Secondary | ICD-10-CM | POA: Diagnosis not present

## 2013-06-05 DIAGNOSIS — S31809A Unspecified open wound of unspecified buttock, initial encounter: Secondary | ICD-10-CM | POA: Diagnosis not present

## 2013-06-06 DIAGNOSIS — G4730 Sleep apnea, unspecified: Secondary | ICD-10-CM | POA: Diagnosis not present

## 2013-06-06 DIAGNOSIS — Z466 Encounter for fitting and adjustment of urinary device: Secondary | ICD-10-CM | POA: Diagnosis not present

## 2013-06-06 DIAGNOSIS — S31809A Unspecified open wound of unspecified buttock, initial encounter: Secondary | ICD-10-CM | POA: Diagnosis not present

## 2013-06-06 DIAGNOSIS — F411 Generalized anxiety disorder: Secondary | ICD-10-CM | POA: Diagnosis not present

## 2013-06-06 DIAGNOSIS — R609 Edema, unspecified: Secondary | ICD-10-CM | POA: Diagnosis not present

## 2013-06-06 DIAGNOSIS — R339 Retention of urine, unspecified: Secondary | ICD-10-CM | POA: Diagnosis not present

## 2013-06-09 DIAGNOSIS — F411 Generalized anxiety disorder: Secondary | ICD-10-CM | POA: Diagnosis not present

## 2013-06-09 DIAGNOSIS — R609 Edema, unspecified: Secondary | ICD-10-CM | POA: Diagnosis not present

## 2013-06-09 DIAGNOSIS — G4730 Sleep apnea, unspecified: Secondary | ICD-10-CM | POA: Diagnosis not present

## 2013-06-09 DIAGNOSIS — S31809A Unspecified open wound of unspecified buttock, initial encounter: Secondary | ICD-10-CM | POA: Diagnosis not present

## 2013-06-09 DIAGNOSIS — N390 Urinary tract infection, site not specified: Secondary | ICD-10-CM | POA: Diagnosis not present

## 2013-06-09 DIAGNOSIS — Z466 Encounter for fitting and adjustment of urinary device: Secondary | ICD-10-CM | POA: Diagnosis not present

## 2013-06-09 DIAGNOSIS — R339 Retention of urine, unspecified: Secondary | ICD-10-CM | POA: Diagnosis not present

## 2013-06-10 DIAGNOSIS — N390 Urinary tract infection, site not specified: Secondary | ICD-10-CM | POA: Diagnosis not present

## 2013-06-11 DIAGNOSIS — N390 Urinary tract infection, site not specified: Secondary | ICD-10-CM | POA: Diagnosis not present

## 2013-06-11 DIAGNOSIS — G4730 Sleep apnea, unspecified: Secondary | ICD-10-CM | POA: Diagnosis not present

## 2013-06-11 DIAGNOSIS — Z466 Encounter for fitting and adjustment of urinary device: Secondary | ICD-10-CM | POA: Diagnosis not present

## 2013-06-11 DIAGNOSIS — R609 Edema, unspecified: Secondary | ICD-10-CM | POA: Diagnosis not present

## 2013-06-11 DIAGNOSIS — M19049 Primary osteoarthritis, unspecified hand: Secondary | ICD-10-CM | POA: Diagnosis not present

## 2013-06-11 DIAGNOSIS — S31809A Unspecified open wound of unspecified buttock, initial encounter: Secondary | ICD-10-CM | POA: Diagnosis not present

## 2013-06-11 DIAGNOSIS — R339 Retention of urine, unspecified: Secondary | ICD-10-CM | POA: Diagnosis not present

## 2013-06-11 DIAGNOSIS — F411 Generalized anxiety disorder: Secondary | ICD-10-CM | POA: Diagnosis not present

## 2013-06-12 DIAGNOSIS — S31809A Unspecified open wound of unspecified buttock, initial encounter: Secondary | ICD-10-CM | POA: Diagnosis not present

## 2013-06-12 DIAGNOSIS — R609 Edema, unspecified: Secondary | ICD-10-CM | POA: Diagnosis not present

## 2013-06-12 DIAGNOSIS — R339 Retention of urine, unspecified: Secondary | ICD-10-CM | POA: Diagnosis not present

## 2013-06-12 DIAGNOSIS — Z466 Encounter for fitting and adjustment of urinary device: Secondary | ICD-10-CM | POA: Diagnosis not present

## 2013-06-12 DIAGNOSIS — F411 Generalized anxiety disorder: Secondary | ICD-10-CM | POA: Diagnosis not present

## 2013-06-12 DIAGNOSIS — N390 Urinary tract infection, site not specified: Secondary | ICD-10-CM | POA: Diagnosis not present

## 2013-06-13 DIAGNOSIS — F411 Generalized anxiety disorder: Secondary | ICD-10-CM | POA: Diagnosis not present

## 2013-06-13 DIAGNOSIS — S31809A Unspecified open wound of unspecified buttock, initial encounter: Secondary | ICD-10-CM | POA: Diagnosis not present

## 2013-06-13 DIAGNOSIS — N390 Urinary tract infection, site not specified: Secondary | ICD-10-CM | POA: Diagnosis not present

## 2013-06-13 DIAGNOSIS — R339 Retention of urine, unspecified: Secondary | ICD-10-CM | POA: Diagnosis not present

## 2013-06-13 DIAGNOSIS — Z466 Encounter for fitting and adjustment of urinary device: Secondary | ICD-10-CM | POA: Diagnosis not present

## 2013-06-13 DIAGNOSIS — R609 Edema, unspecified: Secondary | ICD-10-CM | POA: Diagnosis not present

## 2013-06-16 DIAGNOSIS — Z466 Encounter for fitting and adjustment of urinary device: Secondary | ICD-10-CM | POA: Diagnosis not present

## 2013-06-16 DIAGNOSIS — R609 Edema, unspecified: Secondary | ICD-10-CM | POA: Diagnosis not present

## 2013-06-16 DIAGNOSIS — S31809A Unspecified open wound of unspecified buttock, initial encounter: Secondary | ICD-10-CM | POA: Diagnosis not present

## 2013-06-16 DIAGNOSIS — F411 Generalized anxiety disorder: Secondary | ICD-10-CM | POA: Diagnosis not present

## 2013-06-16 DIAGNOSIS — R339 Retention of urine, unspecified: Secondary | ICD-10-CM | POA: Diagnosis not present

## 2013-06-16 DIAGNOSIS — N390 Urinary tract infection, site not specified: Secondary | ICD-10-CM | POA: Diagnosis not present

## 2013-06-18 DIAGNOSIS — S31809A Unspecified open wound of unspecified buttock, initial encounter: Secondary | ICD-10-CM | POA: Diagnosis not present

## 2013-06-18 DIAGNOSIS — N390 Urinary tract infection, site not specified: Secondary | ICD-10-CM | POA: Diagnosis not present

## 2013-06-18 DIAGNOSIS — R609 Edema, unspecified: Secondary | ICD-10-CM | POA: Diagnosis not present

## 2013-06-18 DIAGNOSIS — F411 Generalized anxiety disorder: Secondary | ICD-10-CM | POA: Diagnosis not present

## 2013-06-18 DIAGNOSIS — Z466 Encounter for fitting and adjustment of urinary device: Secondary | ICD-10-CM | POA: Diagnosis not present

## 2013-06-18 DIAGNOSIS — R339 Retention of urine, unspecified: Secondary | ICD-10-CM | POA: Diagnosis not present

## 2013-06-19 DIAGNOSIS — S31809A Unspecified open wound of unspecified buttock, initial encounter: Secondary | ICD-10-CM | POA: Diagnosis not present

## 2013-06-19 DIAGNOSIS — Z466 Encounter for fitting and adjustment of urinary device: Secondary | ICD-10-CM | POA: Diagnosis not present

## 2013-06-19 DIAGNOSIS — F411 Generalized anxiety disorder: Secondary | ICD-10-CM | POA: Diagnosis not present

## 2013-06-19 DIAGNOSIS — R609 Edema, unspecified: Secondary | ICD-10-CM | POA: Diagnosis not present

## 2013-06-19 DIAGNOSIS — R339 Retention of urine, unspecified: Secondary | ICD-10-CM | POA: Diagnosis not present

## 2013-06-19 DIAGNOSIS — N390 Urinary tract infection, site not specified: Secondary | ICD-10-CM | POA: Diagnosis not present

## 2013-06-20 DIAGNOSIS — Z466 Encounter for fitting and adjustment of urinary device: Secondary | ICD-10-CM | POA: Diagnosis not present

## 2013-06-20 DIAGNOSIS — F411 Generalized anxiety disorder: Secondary | ICD-10-CM | POA: Diagnosis not present

## 2013-06-20 DIAGNOSIS — N390 Urinary tract infection, site not specified: Secondary | ICD-10-CM | POA: Diagnosis not present

## 2013-06-20 DIAGNOSIS — S31809A Unspecified open wound of unspecified buttock, initial encounter: Secondary | ICD-10-CM | POA: Diagnosis not present

## 2013-06-20 DIAGNOSIS — R339 Retention of urine, unspecified: Secondary | ICD-10-CM | POA: Diagnosis not present

## 2013-06-20 DIAGNOSIS — R609 Edema, unspecified: Secondary | ICD-10-CM | POA: Diagnosis not present

## 2013-06-23 DIAGNOSIS — R609 Edema, unspecified: Secondary | ICD-10-CM | POA: Diagnosis not present

## 2013-06-23 DIAGNOSIS — Z466 Encounter for fitting and adjustment of urinary device: Secondary | ICD-10-CM | POA: Diagnosis not present

## 2013-06-23 DIAGNOSIS — F411 Generalized anxiety disorder: Secondary | ICD-10-CM | POA: Diagnosis not present

## 2013-06-23 DIAGNOSIS — N390 Urinary tract infection, site not specified: Secondary | ICD-10-CM | POA: Diagnosis not present

## 2013-06-23 DIAGNOSIS — S31809A Unspecified open wound of unspecified buttock, initial encounter: Secondary | ICD-10-CM | POA: Diagnosis not present

## 2013-06-23 DIAGNOSIS — R339 Retention of urine, unspecified: Secondary | ICD-10-CM | POA: Diagnosis not present

## 2013-06-25 DIAGNOSIS — Z466 Encounter for fitting and adjustment of urinary device: Secondary | ICD-10-CM | POA: Diagnosis not present

## 2013-06-25 DIAGNOSIS — F411 Generalized anxiety disorder: Secondary | ICD-10-CM | POA: Diagnosis not present

## 2013-06-25 DIAGNOSIS — S31809A Unspecified open wound of unspecified buttock, initial encounter: Secondary | ICD-10-CM | POA: Diagnosis not present

## 2013-06-25 DIAGNOSIS — N390 Urinary tract infection, site not specified: Secondary | ICD-10-CM | POA: Diagnosis not present

## 2013-06-25 DIAGNOSIS — R609 Edema, unspecified: Secondary | ICD-10-CM | POA: Diagnosis not present

## 2013-06-25 DIAGNOSIS — R339 Retention of urine, unspecified: Secondary | ICD-10-CM | POA: Diagnosis not present

## 2013-06-26 DIAGNOSIS — Z466 Encounter for fitting and adjustment of urinary device: Secondary | ICD-10-CM | POA: Diagnosis not present

## 2013-06-26 DIAGNOSIS — N390 Urinary tract infection, site not specified: Secondary | ICD-10-CM | POA: Diagnosis not present

## 2013-06-26 DIAGNOSIS — F411 Generalized anxiety disorder: Secondary | ICD-10-CM | POA: Diagnosis not present

## 2013-06-26 DIAGNOSIS — R609 Edema, unspecified: Secondary | ICD-10-CM | POA: Diagnosis not present

## 2013-06-26 DIAGNOSIS — S31809A Unspecified open wound of unspecified buttock, initial encounter: Secondary | ICD-10-CM | POA: Diagnosis not present

## 2013-06-26 DIAGNOSIS — R339 Retention of urine, unspecified: Secondary | ICD-10-CM | POA: Diagnosis not present

## 2013-06-27 DIAGNOSIS — S31809A Unspecified open wound of unspecified buttock, initial encounter: Secondary | ICD-10-CM | POA: Diagnosis not present

## 2013-06-27 DIAGNOSIS — N390 Urinary tract infection, site not specified: Secondary | ICD-10-CM | POA: Diagnosis not present

## 2013-06-27 DIAGNOSIS — Z466 Encounter for fitting and adjustment of urinary device: Secondary | ICD-10-CM | POA: Diagnosis not present

## 2013-06-27 DIAGNOSIS — R609 Edema, unspecified: Secondary | ICD-10-CM | POA: Diagnosis not present

## 2013-06-27 DIAGNOSIS — F411 Generalized anxiety disorder: Secondary | ICD-10-CM | POA: Diagnosis not present

## 2013-06-27 DIAGNOSIS — R339 Retention of urine, unspecified: Secondary | ICD-10-CM | POA: Diagnosis not present

## 2013-06-29 DIAGNOSIS — R5381 Other malaise: Secondary | ICD-10-CM | POA: Diagnosis not present

## 2013-06-30 DIAGNOSIS — S31809A Unspecified open wound of unspecified buttock, initial encounter: Secondary | ICD-10-CM | POA: Diagnosis not present

## 2013-06-30 DIAGNOSIS — N390 Urinary tract infection, site not specified: Secondary | ICD-10-CM | POA: Diagnosis not present

## 2013-06-30 DIAGNOSIS — Z466 Encounter for fitting and adjustment of urinary device: Secondary | ICD-10-CM | POA: Diagnosis not present

## 2013-06-30 DIAGNOSIS — R339 Retention of urine, unspecified: Secondary | ICD-10-CM | POA: Diagnosis not present

## 2013-06-30 DIAGNOSIS — R609 Edema, unspecified: Secondary | ICD-10-CM | POA: Diagnosis not present

## 2013-06-30 DIAGNOSIS — F411 Generalized anxiety disorder: Secondary | ICD-10-CM | POA: Diagnosis not present

## 2013-07-02 DIAGNOSIS — R339 Retention of urine, unspecified: Secondary | ICD-10-CM | POA: Diagnosis not present

## 2013-07-02 DIAGNOSIS — F411 Generalized anxiety disorder: Secondary | ICD-10-CM | POA: Diagnosis not present

## 2013-07-02 DIAGNOSIS — N390 Urinary tract infection, site not specified: Secondary | ICD-10-CM | POA: Diagnosis not present

## 2013-07-02 DIAGNOSIS — R609 Edema, unspecified: Secondary | ICD-10-CM | POA: Diagnosis not present

## 2013-07-02 DIAGNOSIS — Z466 Encounter for fitting and adjustment of urinary device: Secondary | ICD-10-CM | POA: Diagnosis not present

## 2013-07-02 DIAGNOSIS — S31809A Unspecified open wound of unspecified buttock, initial encounter: Secondary | ICD-10-CM | POA: Diagnosis not present

## 2013-07-03 DIAGNOSIS — Z466 Encounter for fitting and adjustment of urinary device: Secondary | ICD-10-CM | POA: Diagnosis not present

## 2013-07-03 DIAGNOSIS — R339 Retention of urine, unspecified: Secondary | ICD-10-CM | POA: Diagnosis not present

## 2013-07-03 DIAGNOSIS — R609 Edema, unspecified: Secondary | ICD-10-CM | POA: Diagnosis not present

## 2013-07-03 DIAGNOSIS — F411 Generalized anxiety disorder: Secondary | ICD-10-CM | POA: Diagnosis not present

## 2013-07-03 DIAGNOSIS — S31809A Unspecified open wound of unspecified buttock, initial encounter: Secondary | ICD-10-CM | POA: Diagnosis not present

## 2013-07-03 DIAGNOSIS — N390 Urinary tract infection, site not specified: Secondary | ICD-10-CM | POA: Diagnosis not present

## 2013-07-04 DIAGNOSIS — N390 Urinary tract infection, site not specified: Secondary | ICD-10-CM | POA: Diagnosis not present

## 2013-07-04 DIAGNOSIS — R339 Retention of urine, unspecified: Secondary | ICD-10-CM | POA: Diagnosis not present

## 2013-07-04 DIAGNOSIS — F411 Generalized anxiety disorder: Secondary | ICD-10-CM | POA: Diagnosis not present

## 2013-07-04 DIAGNOSIS — R609 Edema, unspecified: Secondary | ICD-10-CM | POA: Diagnosis not present

## 2013-07-04 DIAGNOSIS — Z466 Encounter for fitting and adjustment of urinary device: Secondary | ICD-10-CM | POA: Diagnosis not present

## 2013-07-04 DIAGNOSIS — Z23 Encounter for immunization: Secondary | ICD-10-CM | POA: Diagnosis not present

## 2013-07-04 DIAGNOSIS — S31809A Unspecified open wound of unspecified buttock, initial encounter: Secondary | ICD-10-CM | POA: Diagnosis not present

## 2013-07-04 DIAGNOSIS — Z79899 Other long term (current) drug therapy: Secondary | ICD-10-CM | POA: Diagnosis not present

## 2013-07-07 DIAGNOSIS — R609 Edema, unspecified: Secondary | ICD-10-CM | POA: Diagnosis not present

## 2013-07-07 DIAGNOSIS — N390 Urinary tract infection, site not specified: Secondary | ICD-10-CM | POA: Diagnosis not present

## 2013-07-07 DIAGNOSIS — Z466 Encounter for fitting and adjustment of urinary device: Secondary | ICD-10-CM | POA: Diagnosis not present

## 2013-07-07 DIAGNOSIS — F411 Generalized anxiety disorder: Secondary | ICD-10-CM | POA: Diagnosis not present

## 2013-07-07 DIAGNOSIS — S31809A Unspecified open wound of unspecified buttock, initial encounter: Secondary | ICD-10-CM | POA: Diagnosis not present

## 2013-07-07 DIAGNOSIS — R339 Retention of urine, unspecified: Secondary | ICD-10-CM | POA: Diagnosis not present

## 2013-07-09 DIAGNOSIS — S31809A Unspecified open wound of unspecified buttock, initial encounter: Secondary | ICD-10-CM | POA: Diagnosis not present

## 2013-07-09 DIAGNOSIS — R609 Edema, unspecified: Secondary | ICD-10-CM | POA: Diagnosis not present

## 2013-07-09 DIAGNOSIS — N390 Urinary tract infection, site not specified: Secondary | ICD-10-CM | POA: Diagnosis not present

## 2013-07-09 DIAGNOSIS — R339 Retention of urine, unspecified: Secondary | ICD-10-CM | POA: Diagnosis not present

## 2013-07-09 DIAGNOSIS — F411 Generalized anxiety disorder: Secondary | ICD-10-CM | POA: Diagnosis not present

## 2013-07-09 DIAGNOSIS — Z466 Encounter for fitting and adjustment of urinary device: Secondary | ICD-10-CM | POA: Diagnosis not present

## 2013-07-10 DIAGNOSIS — R339 Retention of urine, unspecified: Secondary | ICD-10-CM | POA: Diagnosis not present

## 2013-07-10 DIAGNOSIS — R609 Edema, unspecified: Secondary | ICD-10-CM | POA: Diagnosis not present

## 2013-07-10 DIAGNOSIS — F411 Generalized anxiety disorder: Secondary | ICD-10-CM | POA: Diagnosis not present

## 2013-07-10 DIAGNOSIS — Z466 Encounter for fitting and adjustment of urinary device: Secondary | ICD-10-CM | POA: Diagnosis not present

## 2013-07-10 DIAGNOSIS — N390 Urinary tract infection, site not specified: Secondary | ICD-10-CM | POA: Diagnosis not present

## 2013-07-10 DIAGNOSIS — S31809A Unspecified open wound of unspecified buttock, initial encounter: Secondary | ICD-10-CM | POA: Diagnosis not present

## 2013-07-11 DIAGNOSIS — R609 Edema, unspecified: Secondary | ICD-10-CM | POA: Diagnosis not present

## 2013-07-11 DIAGNOSIS — Z466 Encounter for fitting and adjustment of urinary device: Secondary | ICD-10-CM | POA: Diagnosis not present

## 2013-07-11 DIAGNOSIS — S31809A Unspecified open wound of unspecified buttock, initial encounter: Secondary | ICD-10-CM | POA: Diagnosis not present

## 2013-07-11 DIAGNOSIS — N390 Urinary tract infection, site not specified: Secondary | ICD-10-CM | POA: Diagnosis not present

## 2013-07-11 DIAGNOSIS — F411 Generalized anxiety disorder: Secondary | ICD-10-CM | POA: Diagnosis not present

## 2013-07-11 DIAGNOSIS — R339 Retention of urine, unspecified: Secondary | ICD-10-CM | POA: Diagnosis not present

## 2013-07-14 DIAGNOSIS — R609 Edema, unspecified: Secondary | ICD-10-CM | POA: Diagnosis not present

## 2013-07-14 DIAGNOSIS — F411 Generalized anxiety disorder: Secondary | ICD-10-CM | POA: Diagnosis not present

## 2013-07-14 DIAGNOSIS — R339 Retention of urine, unspecified: Secondary | ICD-10-CM | POA: Diagnosis not present

## 2013-07-14 DIAGNOSIS — Z466 Encounter for fitting and adjustment of urinary device: Secondary | ICD-10-CM | POA: Diagnosis not present

## 2013-07-14 DIAGNOSIS — S31809A Unspecified open wound of unspecified buttock, initial encounter: Secondary | ICD-10-CM | POA: Diagnosis not present

## 2013-07-14 DIAGNOSIS — N390 Urinary tract infection, site not specified: Secondary | ICD-10-CM | POA: Diagnosis not present

## 2013-07-16 DIAGNOSIS — N390 Urinary tract infection, site not specified: Secondary | ICD-10-CM | POA: Diagnosis not present

## 2013-07-16 DIAGNOSIS — F411 Generalized anxiety disorder: Secondary | ICD-10-CM | POA: Diagnosis not present

## 2013-07-16 DIAGNOSIS — R609 Edema, unspecified: Secondary | ICD-10-CM | POA: Diagnosis not present

## 2013-07-16 DIAGNOSIS — R339 Retention of urine, unspecified: Secondary | ICD-10-CM | POA: Diagnosis not present

## 2013-07-16 DIAGNOSIS — Z466 Encounter for fitting and adjustment of urinary device: Secondary | ICD-10-CM | POA: Diagnosis not present

## 2013-07-16 DIAGNOSIS — S31809A Unspecified open wound of unspecified buttock, initial encounter: Secondary | ICD-10-CM | POA: Diagnosis not present

## 2013-07-18 DIAGNOSIS — S31809A Unspecified open wound of unspecified buttock, initial encounter: Secondary | ICD-10-CM | POA: Diagnosis not present

## 2013-07-18 DIAGNOSIS — R339 Retention of urine, unspecified: Secondary | ICD-10-CM | POA: Diagnosis not present

## 2013-07-18 DIAGNOSIS — N390 Urinary tract infection, site not specified: Secondary | ICD-10-CM | POA: Diagnosis not present

## 2013-07-18 DIAGNOSIS — R609 Edema, unspecified: Secondary | ICD-10-CM | POA: Diagnosis not present

## 2013-07-18 DIAGNOSIS — Z466 Encounter for fitting and adjustment of urinary device: Secondary | ICD-10-CM | POA: Diagnosis not present

## 2013-07-18 DIAGNOSIS — F411 Generalized anxiety disorder: Secondary | ICD-10-CM | POA: Diagnosis not present

## 2013-07-21 DIAGNOSIS — Z466 Encounter for fitting and adjustment of urinary device: Secondary | ICD-10-CM | POA: Diagnosis not present

## 2013-07-21 DIAGNOSIS — F411 Generalized anxiety disorder: Secondary | ICD-10-CM | POA: Diagnosis not present

## 2013-07-21 DIAGNOSIS — N390 Urinary tract infection, site not specified: Secondary | ICD-10-CM | POA: Diagnosis not present

## 2013-07-21 DIAGNOSIS — R609 Edema, unspecified: Secondary | ICD-10-CM | POA: Diagnosis not present

## 2013-07-21 DIAGNOSIS — S31809A Unspecified open wound of unspecified buttock, initial encounter: Secondary | ICD-10-CM | POA: Diagnosis not present

## 2013-07-21 DIAGNOSIS — R339 Retention of urine, unspecified: Secondary | ICD-10-CM | POA: Diagnosis not present

## 2013-07-22 DIAGNOSIS — Z466 Encounter for fitting and adjustment of urinary device: Secondary | ICD-10-CM | POA: Diagnosis not present

## 2013-07-22 DIAGNOSIS — R339 Retention of urine, unspecified: Secondary | ICD-10-CM | POA: Diagnosis not present

## 2013-07-22 DIAGNOSIS — R609 Edema, unspecified: Secondary | ICD-10-CM | POA: Diagnosis not present

## 2013-07-22 DIAGNOSIS — N390 Urinary tract infection, site not specified: Secondary | ICD-10-CM | POA: Diagnosis not present

## 2013-07-22 DIAGNOSIS — S31809A Unspecified open wound of unspecified buttock, initial encounter: Secondary | ICD-10-CM | POA: Diagnosis not present

## 2013-07-22 DIAGNOSIS — F411 Generalized anxiety disorder: Secondary | ICD-10-CM | POA: Diagnosis not present

## 2013-07-23 DIAGNOSIS — Z466 Encounter for fitting and adjustment of urinary device: Secondary | ICD-10-CM | POA: Diagnosis not present

## 2013-07-23 DIAGNOSIS — S31809A Unspecified open wound of unspecified buttock, initial encounter: Secondary | ICD-10-CM | POA: Diagnosis not present

## 2013-07-23 DIAGNOSIS — R339 Retention of urine, unspecified: Secondary | ICD-10-CM | POA: Diagnosis not present

## 2013-07-23 DIAGNOSIS — R609 Edema, unspecified: Secondary | ICD-10-CM | POA: Diagnosis not present

## 2013-07-23 DIAGNOSIS — N390 Urinary tract infection, site not specified: Secondary | ICD-10-CM | POA: Diagnosis not present

## 2013-07-23 DIAGNOSIS — F411 Generalized anxiety disorder: Secondary | ICD-10-CM | POA: Diagnosis not present

## 2013-07-24 DIAGNOSIS — R609 Edema, unspecified: Secondary | ICD-10-CM | POA: Diagnosis not present

## 2013-07-24 DIAGNOSIS — Z466 Encounter for fitting and adjustment of urinary device: Secondary | ICD-10-CM | POA: Diagnosis not present

## 2013-07-24 DIAGNOSIS — F411 Generalized anxiety disorder: Secondary | ICD-10-CM | POA: Diagnosis not present

## 2013-07-24 DIAGNOSIS — N390 Urinary tract infection, site not specified: Secondary | ICD-10-CM | POA: Diagnosis not present

## 2013-07-24 DIAGNOSIS — S31809A Unspecified open wound of unspecified buttock, initial encounter: Secondary | ICD-10-CM | POA: Diagnosis not present

## 2013-07-24 DIAGNOSIS — R339 Retention of urine, unspecified: Secondary | ICD-10-CM | POA: Diagnosis not present

## 2013-07-25 DIAGNOSIS — S31809A Unspecified open wound of unspecified buttock, initial encounter: Secondary | ICD-10-CM | POA: Diagnosis not present

## 2013-07-25 DIAGNOSIS — R609 Edema, unspecified: Secondary | ICD-10-CM | POA: Diagnosis not present

## 2013-07-25 DIAGNOSIS — N390 Urinary tract infection, site not specified: Secondary | ICD-10-CM | POA: Diagnosis not present

## 2013-07-25 DIAGNOSIS — Z466 Encounter for fitting and adjustment of urinary device: Secondary | ICD-10-CM | POA: Diagnosis not present

## 2013-07-25 DIAGNOSIS — R339 Retention of urine, unspecified: Secondary | ICD-10-CM | POA: Diagnosis not present

## 2013-07-25 DIAGNOSIS — F411 Generalized anxiety disorder: Secondary | ICD-10-CM | POA: Diagnosis not present

## 2013-07-28 DIAGNOSIS — R339 Retention of urine, unspecified: Secondary | ICD-10-CM | POA: Diagnosis not present

## 2013-07-28 DIAGNOSIS — R609 Edema, unspecified: Secondary | ICD-10-CM | POA: Diagnosis not present

## 2013-07-28 DIAGNOSIS — S31809A Unspecified open wound of unspecified buttock, initial encounter: Secondary | ICD-10-CM | POA: Diagnosis not present

## 2013-07-28 DIAGNOSIS — N390 Urinary tract infection, site not specified: Secondary | ICD-10-CM | POA: Diagnosis not present

## 2013-07-28 DIAGNOSIS — F411 Generalized anxiety disorder: Secondary | ICD-10-CM | POA: Diagnosis not present

## 2013-07-28 DIAGNOSIS — Z466 Encounter for fitting and adjustment of urinary device: Secondary | ICD-10-CM | POA: Diagnosis not present

## 2013-07-29 DIAGNOSIS — R339 Retention of urine, unspecified: Secondary | ICD-10-CM | POA: Diagnosis not present

## 2013-07-29 DIAGNOSIS — R609 Edema, unspecified: Secondary | ICD-10-CM | POA: Diagnosis not present

## 2013-07-29 DIAGNOSIS — S31809A Unspecified open wound of unspecified buttock, initial encounter: Secondary | ICD-10-CM | POA: Diagnosis not present

## 2013-07-29 DIAGNOSIS — Z466 Encounter for fitting and adjustment of urinary device: Secondary | ICD-10-CM | POA: Diagnosis not present

## 2013-07-29 DIAGNOSIS — N390 Urinary tract infection, site not specified: Secondary | ICD-10-CM | POA: Diagnosis not present

## 2013-07-29 DIAGNOSIS — F411 Generalized anxiety disorder: Secondary | ICD-10-CM | POA: Diagnosis not present

## 2013-07-31 DIAGNOSIS — Z466 Encounter for fitting and adjustment of urinary device: Secondary | ICD-10-CM | POA: Diagnosis not present

## 2013-07-31 DIAGNOSIS — S31809A Unspecified open wound of unspecified buttock, initial encounter: Secondary | ICD-10-CM | POA: Diagnosis not present

## 2013-07-31 DIAGNOSIS — F411 Generalized anxiety disorder: Secondary | ICD-10-CM | POA: Diagnosis not present

## 2013-07-31 DIAGNOSIS — R609 Edema, unspecified: Secondary | ICD-10-CM | POA: Diagnosis not present

## 2013-07-31 DIAGNOSIS — R339 Retention of urine, unspecified: Secondary | ICD-10-CM | POA: Diagnosis not present

## 2013-07-31 DIAGNOSIS — N390 Urinary tract infection, site not specified: Secondary | ICD-10-CM | POA: Diagnosis not present

## 2013-08-01 DIAGNOSIS — N390 Urinary tract infection, site not specified: Secondary | ICD-10-CM | POA: Diagnosis not present

## 2013-08-01 DIAGNOSIS — R609 Edema, unspecified: Secondary | ICD-10-CM | POA: Diagnosis not present

## 2013-08-01 DIAGNOSIS — R339 Retention of urine, unspecified: Secondary | ICD-10-CM | POA: Diagnosis not present

## 2013-08-01 DIAGNOSIS — F411 Generalized anxiety disorder: Secondary | ICD-10-CM | POA: Diagnosis not present

## 2013-08-01 DIAGNOSIS — S31809A Unspecified open wound of unspecified buttock, initial encounter: Secondary | ICD-10-CM | POA: Diagnosis not present

## 2013-08-01 DIAGNOSIS — Z466 Encounter for fitting and adjustment of urinary device: Secondary | ICD-10-CM | POA: Diagnosis not present

## 2013-08-04 DIAGNOSIS — N390 Urinary tract infection, site not specified: Secondary | ICD-10-CM | POA: Diagnosis not present

## 2013-08-04 DIAGNOSIS — R609 Edema, unspecified: Secondary | ICD-10-CM | POA: Diagnosis not present

## 2013-08-04 DIAGNOSIS — R339 Retention of urine, unspecified: Secondary | ICD-10-CM | POA: Diagnosis not present

## 2013-08-04 DIAGNOSIS — F411 Generalized anxiety disorder: Secondary | ICD-10-CM | POA: Diagnosis not present

## 2013-08-04 DIAGNOSIS — S31809A Unspecified open wound of unspecified buttock, initial encounter: Secondary | ICD-10-CM | POA: Diagnosis not present

## 2013-08-04 DIAGNOSIS — Z466 Encounter for fitting and adjustment of urinary device: Secondary | ICD-10-CM | POA: Diagnosis not present

## 2013-08-05 DIAGNOSIS — R609 Edema, unspecified: Secondary | ICD-10-CM | POA: Diagnosis not present

## 2013-08-05 DIAGNOSIS — N390 Urinary tract infection, site not specified: Secondary | ICD-10-CM | POA: Diagnosis not present

## 2013-08-05 DIAGNOSIS — F411 Generalized anxiety disorder: Secondary | ICD-10-CM | POA: Diagnosis not present

## 2013-08-05 DIAGNOSIS — S31809A Unspecified open wound of unspecified buttock, initial encounter: Secondary | ICD-10-CM | POA: Diagnosis not present

## 2013-08-05 DIAGNOSIS — R339 Retention of urine, unspecified: Secondary | ICD-10-CM | POA: Diagnosis not present

## 2013-08-05 DIAGNOSIS — Z466 Encounter for fitting and adjustment of urinary device: Secondary | ICD-10-CM | POA: Diagnosis not present

## 2013-08-06 DIAGNOSIS — F411 Generalized anxiety disorder: Secondary | ICD-10-CM | POA: Diagnosis not present

## 2013-08-06 DIAGNOSIS — R609 Edema, unspecified: Secondary | ICD-10-CM | POA: Diagnosis not present

## 2013-08-06 DIAGNOSIS — R339 Retention of urine, unspecified: Secondary | ICD-10-CM | POA: Diagnosis not present

## 2013-08-06 DIAGNOSIS — N390 Urinary tract infection, site not specified: Secondary | ICD-10-CM | POA: Diagnosis not present

## 2013-08-06 DIAGNOSIS — Z466 Encounter for fitting and adjustment of urinary device: Secondary | ICD-10-CM | POA: Diagnosis not present

## 2013-08-06 DIAGNOSIS — S31809A Unspecified open wound of unspecified buttock, initial encounter: Secondary | ICD-10-CM | POA: Diagnosis not present

## 2013-08-08 DIAGNOSIS — R339 Retention of urine, unspecified: Secondary | ICD-10-CM | POA: Diagnosis not present

## 2013-09-09 DIAGNOSIS — R339 Retention of urine, unspecified: Secondary | ICD-10-CM | POA: Diagnosis not present

## 2013-10-13 DIAGNOSIS — R339 Retention of urine, unspecified: Secondary | ICD-10-CM | POA: Diagnosis not present

## 2013-10-13 DIAGNOSIS — N390 Urinary tract infection, site not specified: Secondary | ICD-10-CM | POA: Diagnosis not present

## 2013-11-14 DIAGNOSIS — R339 Retention of urine, unspecified: Secondary | ICD-10-CM | POA: Diagnosis not present

## 2013-12-10 DIAGNOSIS — R339 Retention of urine, unspecified: Secondary | ICD-10-CM | POA: Diagnosis not present

## 2014-01-08 DIAGNOSIS — N319 Neuromuscular dysfunction of bladder, unspecified: Secondary | ICD-10-CM | POA: Diagnosis not present

## 2014-01-13 DIAGNOSIS — R634 Abnormal weight loss: Secondary | ICD-10-CM | POA: Diagnosis not present

## 2014-01-13 DIAGNOSIS — E669 Obesity, unspecified: Secondary | ICD-10-CM | POA: Diagnosis not present

## 2014-01-13 DIAGNOSIS — Z6841 Body Mass Index (BMI) 40.0 and over, adult: Secondary | ICD-10-CM | POA: Diagnosis not present

## 2014-01-13 DIAGNOSIS — N400 Enlarged prostate without lower urinary tract symptoms: Secondary | ICD-10-CM | POA: Diagnosis not present

## 2014-02-12 DIAGNOSIS — R339 Retention of urine, unspecified: Secondary | ICD-10-CM | POA: Diagnosis not present

## 2014-03-13 DIAGNOSIS — R339 Retention of urine, unspecified: Secondary | ICD-10-CM | POA: Diagnosis not present

## 2014-04-10 DIAGNOSIS — R339 Retention of urine, unspecified: Secondary | ICD-10-CM | POA: Diagnosis not present

## 2014-05-14 DIAGNOSIS — R339 Retention of urine, unspecified: Secondary | ICD-10-CM | POA: Diagnosis not present

## 2014-06-10 ENCOUNTER — Emergency Department (HOSPITAL_COMMUNITY): Payer: Medicare Other

## 2014-06-10 ENCOUNTER — Inpatient Hospital Stay (HOSPITAL_COMMUNITY)
Admission: EM | Admit: 2014-06-10 | Discharge: 2014-06-16 | DRG: 872 | Disposition: A | Discharge status: Skilled Nursing Facility | Payer: Medicare Other | Attending: Internal Medicine | Admitting: Internal Medicine

## 2014-06-10 ENCOUNTER — Encounter (HOSPITAL_COMMUNITY): Payer: Self-pay | Admitting: General Practice

## 2014-06-10 DIAGNOSIS — G4733 Obstructive sleep apnea (adult) (pediatric): Secondary | ICD-10-CM | POA: Diagnosis present

## 2014-06-10 DIAGNOSIS — A419 Sepsis, unspecified organism: Principal | ICD-10-CM | POA: Diagnosis present

## 2014-06-10 DIAGNOSIS — IMO0001 Reserved for inherently not codable concepts without codable children: Secondary | ICD-10-CM | POA: Diagnosis not present

## 2014-06-10 DIAGNOSIS — B958 Unspecified staphylococcus as the cause of diseases classified elsewhere: Secondary | ICD-10-CM | POA: Diagnosis not present

## 2014-06-10 DIAGNOSIS — E871 Hypo-osmolality and hyponatremia: Secondary | ICD-10-CM | POA: Diagnosis present

## 2014-06-10 DIAGNOSIS — G4730 Sleep apnea, unspecified: Secondary | ICD-10-CM | POA: Diagnosis not present

## 2014-06-10 DIAGNOSIS — Z91199 Patient's noncompliance with other medical treatment and regimen due to unspecified reason: Secondary | ICD-10-CM

## 2014-06-10 DIAGNOSIS — Z87440 Personal history of urinary (tract) infections: Secondary | ICD-10-CM

## 2014-06-10 DIAGNOSIS — I509 Heart failure, unspecified: Secondary | ICD-10-CM | POA: Diagnosis not present

## 2014-06-10 DIAGNOSIS — Z8546 Personal history of malignant neoplasm of prostate: Secondary | ICD-10-CM

## 2014-06-10 DIAGNOSIS — S199XXA Unspecified injury of neck, initial encounter: Secondary | ICD-10-CM | POA: Diagnosis not present

## 2014-06-10 DIAGNOSIS — R5381 Other malaise: Secondary | ICD-10-CM | POA: Diagnosis not present

## 2014-06-10 DIAGNOSIS — I10 Essential (primary) hypertension: Secondary | ICD-10-CM | POA: Diagnosis present

## 2014-06-10 DIAGNOSIS — Z7982 Long term (current) use of aspirin: Secondary | ICD-10-CM

## 2014-06-10 DIAGNOSIS — I5030 Unspecified diastolic (congestive) heart failure: Secondary | ICD-10-CM | POA: Diagnosis present

## 2014-06-10 DIAGNOSIS — M48061 Spinal stenosis, lumbar region without neurogenic claudication: Secondary | ICD-10-CM | POA: Diagnosis not present

## 2014-06-10 DIAGNOSIS — M545 Low back pain, unspecified: Secondary | ICD-10-CM | POA: Diagnosis not present

## 2014-06-10 DIAGNOSIS — Z9119 Patient's noncompliance with other medical treatment and regimen: Secondary | ICD-10-CM

## 2014-06-10 DIAGNOSIS — Y92009 Unspecified place in unspecified non-institutional (private) residence as the place of occurrence of the external cause: Secondary | ICD-10-CM

## 2014-06-10 DIAGNOSIS — S0990XA Unspecified injury of head, initial encounter: Secondary | ICD-10-CM | POA: Diagnosis not present

## 2014-06-10 DIAGNOSIS — N179 Acute kidney failure, unspecified: Secondary | ICD-10-CM | POA: Diagnosis present

## 2014-06-10 DIAGNOSIS — W19XXXA Unspecified fall, initial encounter: Secondary | ICD-10-CM | POA: Diagnosis not present

## 2014-06-10 DIAGNOSIS — Z9181 History of falling: Secondary | ICD-10-CM

## 2014-06-10 DIAGNOSIS — T148XXA Other injury of unspecified body region, initial encounter: Secondary | ICD-10-CM | POA: Diagnosis not present

## 2014-06-10 DIAGNOSIS — E870 Hyperosmolality and hypernatremia: Secondary | ICD-10-CM | POA: Diagnosis not present

## 2014-06-10 DIAGNOSIS — M129 Arthropathy, unspecified: Secondary | ICD-10-CM | POA: Diagnosis present

## 2014-06-10 DIAGNOSIS — J42 Unspecified chronic bronchitis: Secondary | ICD-10-CM | POA: Diagnosis not present

## 2014-06-10 DIAGNOSIS — R7881 Bacteremia: Secondary | ICD-10-CM

## 2014-06-10 DIAGNOSIS — N400 Enlarged prostate without lower urinary tract symptoms: Secondary | ICD-10-CM | POA: Diagnosis not present

## 2014-06-10 DIAGNOSIS — N390 Urinary tract infection, site not specified: Secondary | ICD-10-CM | POA: Diagnosis present

## 2014-06-10 DIAGNOSIS — S0993XA Unspecified injury of face, initial encounter: Secondary | ICD-10-CM | POA: Diagnosis not present

## 2014-06-10 DIAGNOSIS — A4901 Methicillin susceptible Staphylococcus aureus infection, unspecified site: Secondary | ICD-10-CM | POA: Diagnosis not present

## 2014-06-10 DIAGNOSIS — A4189 Other specified sepsis: Secondary | ICD-10-CM | POA: Diagnosis not present

## 2014-06-10 DIAGNOSIS — S298XXA Other specified injuries of thorax, initial encounter: Secondary | ICD-10-CM | POA: Diagnosis not present

## 2014-06-10 DIAGNOSIS — M47817 Spondylosis without myelopathy or radiculopathy, lumbosacral region: Secondary | ICD-10-CM | POA: Diagnosis not present

## 2014-06-10 DIAGNOSIS — M542 Cervicalgia: Secondary | ICD-10-CM | POA: Diagnosis not present

## 2014-06-10 DIAGNOSIS — M549 Dorsalgia, unspecified: Secondary | ICD-10-CM | POA: Diagnosis present

## 2014-06-10 DIAGNOSIS — R5383 Other fatigue: Secondary | ICD-10-CM | POA: Diagnosis not present

## 2014-06-10 DIAGNOSIS — M130 Polyarthritis, unspecified: Secondary | ICD-10-CM | POA: Diagnosis not present

## 2014-06-10 DIAGNOSIS — R29898 Other symptoms and signs involving the musculoskeletal system: Secondary | ICD-10-CM

## 2014-06-10 DIAGNOSIS — E669 Obesity, unspecified: Secondary | ICD-10-CM

## 2014-06-10 DIAGNOSIS — R3981 Functional urinary incontinence: Secondary | ICD-10-CM

## 2014-06-10 DIAGNOSIS — R51 Headache: Secondary | ICD-10-CM | POA: Diagnosis not present

## 2014-06-10 DIAGNOSIS — IMO0002 Reserved for concepts with insufficient information to code with codable children: Secondary | ICD-10-CM | POA: Diagnosis not present

## 2014-06-10 DIAGNOSIS — I369 Nonrheumatic tricuspid valve disorder, unspecified: Secondary | ICD-10-CM | POA: Diagnosis not present

## 2014-06-10 DIAGNOSIS — A4159 Other Gram-negative sepsis: Secondary | ICD-10-CM | POA: Diagnosis not present

## 2014-06-10 HISTORY — DX: Unspecified coma: R40.20

## 2014-06-10 HISTORY — DX: Post-traumatic stress disorder, unspecified: F43.10

## 2014-06-10 HISTORY — DX: Headache: R51

## 2014-06-10 HISTORY — DX: Unspecified osteoarthritis, unspecified site: M19.90

## 2014-06-10 LAB — URINALYSIS, ROUTINE W REFLEX MICROSCOPIC
BILIRUBIN URINE: NEGATIVE
Glucose, UA: NEGATIVE mg/dL
KETONES UR: 40 mg/dL — AB
NITRITE: POSITIVE — AB
PH: 7 (ref 5.0–8.0)
PROTEIN: 100 mg/dL — AB
Specific Gravity, Urine: 1.016 (ref 1.005–1.030)
Urobilinogen, UA: 0.2 mg/dL (ref 0.0–1.0)

## 2014-06-10 LAB — CBC WITH DIFFERENTIAL/PLATELET
BASOS ABS: 0 10*3/uL (ref 0.0–0.1)
BASOS PCT: 0 % (ref 0–1)
EOS ABS: 0 10*3/uL (ref 0.0–0.7)
EOS PCT: 0 % (ref 0–5)
HEMATOCRIT: 41.5 % (ref 39.0–52.0)
Hemoglobin: 14.1 g/dL (ref 13.0–17.0)
LYMPHS PCT: 5 % — AB (ref 12–46)
Lymphs Abs: 0.6 10*3/uL — ABNORMAL LOW (ref 0.7–4.0)
MCH: 30.6 pg (ref 26.0–34.0)
MCHC: 34 g/dL (ref 30.0–36.0)
MCV: 90 fL (ref 78.0–100.0)
MONO ABS: 1.4 10*3/uL — AB (ref 0.1–1.0)
Monocytes Relative: 11 % (ref 3–12)
Neutro Abs: 11.4 10*3/uL — ABNORMAL HIGH (ref 1.7–7.7)
Neutrophils Relative %: 84 % — ABNORMAL HIGH (ref 43–77)
PLATELETS: 164 10*3/uL (ref 150–400)
RBC: 4.61 MIL/uL (ref 4.22–5.81)
RDW: 13.6 % (ref 11.5–15.5)
WBC: 13.5 10*3/uL — ABNORMAL HIGH (ref 4.0–10.5)

## 2014-06-10 LAB — CBC
HCT: 40.3 % (ref 39.0–52.0)
Hemoglobin: 13.6 g/dL (ref 13.0–17.0)
MCH: 30.9 pg (ref 26.0–34.0)
MCHC: 33.7 g/dL (ref 30.0–36.0)
MCV: 91.6 fL (ref 78.0–100.0)
Platelets: 172 10*3/uL (ref 150–400)
RBC: 4.4 MIL/uL (ref 4.22–5.81)
RDW: 13.5 % (ref 11.5–15.5)
WBC: 11.2 10*3/uL — ABNORMAL HIGH (ref 4.0–10.5)

## 2014-06-10 LAB — HEPATIC FUNCTION PANEL
ALT: 19 U/L (ref 0–53)
AST: 33 U/L (ref 0–37)
Albumin: 3.2 g/dL — ABNORMAL LOW (ref 3.5–5.2)
Alkaline Phosphatase: 63 U/L (ref 39–117)
Bilirubin, Direct: 0.2 mg/dL (ref 0.0–0.3)
Indirect Bilirubin: 0.8 mg/dL (ref 0.3–0.9)
Total Bilirubin: 1 mg/dL (ref 0.3–1.2)
Total Protein: 7.6 g/dL (ref 6.0–8.3)

## 2014-06-10 LAB — URINE MICROSCOPIC-ADD ON

## 2014-06-10 LAB — BASIC METABOLIC PANEL
ANION GAP: 16 — AB (ref 5–15)
BUN: 22 mg/dL (ref 6–23)
CALCIUM: 8.9 mg/dL (ref 8.4–10.5)
CO2: 24 meq/L (ref 19–32)
CREATININE: 1.37 mg/dL — AB (ref 0.50–1.35)
Chloride: 92 mEq/L — ABNORMAL LOW (ref 96–112)
GFR calc Af Amer: 59 mL/min — ABNORMAL LOW (ref >90)
GFR, EST NON AFRICAN AMERICAN: 51 mL/min — AB (ref >90)
Glucose, Bld: 138 mg/dL — ABNORMAL HIGH (ref 70–99)
Potassium: 4.1 mEq/L (ref 3.7–5.3)
Sodium: 132 mEq/L — ABNORMAL LOW (ref 137–147)

## 2014-06-10 LAB — CK: Total CK: 860 U/L — ABNORMAL HIGH (ref 7–232)

## 2014-06-10 LAB — CREATININE, SERUM
CREATININE: 1.19 mg/dL (ref 0.50–1.35)
GFR calc non Af Amer: 60 mL/min — ABNORMAL LOW (ref >90)
GFR, EST AFRICAN AMERICAN: 70 mL/min — AB (ref >90)

## 2014-06-10 LAB — I-STAT CG4 LACTIC ACID, ED: Lactic Acid, Venous: 1.18 mmol/L (ref 0.5–2.2)

## 2014-06-10 LAB — TSH: TSH: 2.5 u[IU]/mL (ref 0.350–4.500)

## 2014-06-10 MED ORDER — ONDANSETRON HCL 4 MG/2ML IJ SOLN
4.0000 mg | Freq: Four times a day (QID) | INTRAMUSCULAR | Status: DC | PRN
Start: 2014-06-10 — End: 2014-06-16

## 2014-06-10 MED ORDER — MORPHINE SULFATE 2 MG/ML IJ SOLN
2.0000 mg | INTRAMUSCULAR | Status: DC | PRN
  Administered 2014-06-12: 2 mg via INTRAVENOUS
  Filled 2014-06-10: qty 1

## 2014-06-10 MED ORDER — MELOXICAM 7.5 MG PO TABS
7.5000 mg | ORAL_TABLET | Freq: Every day | ORAL | Status: DC
  Administered 2014-06-10 – 2014-06-16 (×7): 7.5 mg via ORAL
  Filled 2014-06-10 (×7): qty 1

## 2014-06-10 MED ORDER — SODIUM CHLORIDE 0.9 % IV SOLN
INTRAVENOUS | Status: AC
  Administered 2014-06-10: 15:00:00 via INTRAVENOUS

## 2014-06-10 MED ORDER — DOCUSATE SODIUM 100 MG PO CAPS: 100.0000 mg | ORAL_CAPSULE | Freq: Two times a day (BID) | ORAL | Status: DC | PRN

## 2014-06-10 MED ORDER — VITAMIN B-12 100 MCG PO TABS
100.0000 ug | ORAL_TABLET | Freq: Every day | ORAL | Status: DC
Start: 2014-06-10 — End: 2014-06-16
  Administered 2014-06-10 – 2014-06-16 (×7): 100 ug via ORAL
  Filled 2014-06-10 (×7): qty 1

## 2014-06-10 MED ORDER — ACETAMINOPHEN 325 MG PO TABS
650.0000 mg | ORAL_TABLET | Freq: Four times a day (QID) | ORAL | Status: DC | PRN
  Administered 2014-06-11 – 2014-06-14 (×3): 650 mg via ORAL
  Filled 2014-06-10 (×5): qty 2

## 2014-06-10 MED ORDER — SODIUM CHLORIDE 0.9 % IV BOLUS (SEPSIS): 500.0000 mL | Freq: Once | INTRAVENOUS | Status: DC

## 2014-06-10 MED ORDER — ONDANSETRON HCL 4 MG PO TABS: 4.0000 mg | ORAL_TABLET | Freq: Four times a day (QID) | ORAL | Status: DC | PRN

## 2014-06-10 MED ORDER — ACETAMINOPHEN 325 MG PO TABS
325.0000 mg | ORAL_TABLET | Freq: Once | ORAL | Status: AC
  Administered 2014-06-10: 325 mg via ORAL
  Filled 2014-06-10: qty 1

## 2014-06-10 MED ORDER — ENOXAPARIN SODIUM 80 MG/0.8ML ~~LOC~~ SOLN
65.0000 mg | SUBCUTANEOUS | Status: DC
  Administered 2014-06-10: 65 mg via SUBCUTANEOUS
  Filled 2014-06-10 (×2): qty 0.8

## 2014-06-10 MED ORDER — ACETAMINOPHEN 650 MG RE SUPP: 650.0000 mg | Freq: Four times a day (QID) | RECTAL | Status: DC | PRN

## 2014-06-10 MED ORDER — ASPIRIN EC 325 MG PO TBEC
325.0000 mg | DELAYED_RELEASE_TABLET | Freq: Every day | ORAL | Status: DC
  Administered 2014-06-10 – 2014-06-16 (×7): 325 mg via ORAL
  Filled 2014-06-10 (×7): qty 1

## 2014-06-10 MED ORDER — SODIUM CHLORIDE 0.9 % IV BOLUS (SEPSIS)
1000.0000 mL | Freq: Once | INTRAVENOUS | Status: AC
  Administered 2014-06-10: 1000 mL via INTRAVENOUS

## 2014-06-10 MED ORDER — DEXTROSE 5 % IV SOLN
1.0000 g | Freq: Three times a day (TID) | INTRAVENOUS | Status: DC
  Administered 2014-06-10 – 2014-06-12 (×7): 1 g via INTRAVENOUS
  Filled 2014-06-10 (×9): qty 1

## 2014-06-10 NOTE — ED Notes (Signed)
Patient is not in the room at this moment.  Will collect urine and ekg on their return.

## 2014-06-10 NOTE — ED Notes (Signed)
Floor RN unable to take report at this time.

## 2014-06-10 NOTE — ED Notes (Addendum)
Pt fell 1100am 06/09/14 and EMS called and Pt not transported but picked up off of floor and placed in recliner. EMS again called 0500 06/10/14 for intractable back pain Pt noted still in recliner. Pt reports that he hasnt taken meds or food since Sunday and "because he hasnt take his mobic his knees felt like glass and collapsed on him". Pt has extensive Hx of back pain, surgery, and fractures.   EMS vitals 168/86, HR 80, RR 16, 97% on RA. 6/10 back pain that is non-radiating

## 2014-06-10 NOTE — ED Provider Notes (Signed)
CSN: [id number]     Arrival date & time 06/10/14  0528 History   First MD Initiated Contact with Patient 06/10/14 0532     Chief Complaint  Patient presents with  . Back Pain  . Fall     (Consider location/radiation/quality/duration/timing/severity/associated sxs/prior Treatment) HPI  This is a 68 year old male with a history of back surgery and a chronic indwelling Foley who presents with generalized weakness and a fall. Patient states that since Sunday he is having difficulty getting out of her recliner.  He denies any focal weakness or numbness does states that he is unable to get up. He states that he hasn't eaten since Sunday. He has had water and has been emptying his Foley into a larger container. Patient states he tried to get up yesterday and slid onto the floor. A friend helped him get up. At that time he went to the restroom. He was ambulatory independently. When the restroom he states "my knees buckle" and he fell backwards. He denies loss of consciousness or hitting his head. He called EMS. He was placed back in his recliner. He refused transport at that time. However, since yesterday he has not been able to get out of his recliner. He reports worsening back pain from "sitting in the same position." Back pain is 6/10.  He denies any recent illnesses or fevers.  Past Medical History  Diagnosis Date  . Malaria   . Cervical myelopathy    Past Surgical History  Procedure Laterality Date  . Back surgery      cervical and thoracic decompression for myelopathy   Family History  Problem Relation Age of Onset  . Heart disease Mother   . Heart disease Father   . Dementia Mother   . Dementia Father    History  Substance Use Topics  . Smoking status: Never Smoker   . Smokeless tobacco: Not on file  . Alcohol Use: No    Review of Systems  Constitutional: Negative for fever.  Respiratory: Negative.  Negative for chest tightness and shortness of breath.   Cardiovascular:  Negative.  Negative for chest pain.  Gastrointestinal: Negative.  Negative for abdominal pain.  Genitourinary: Negative.   Musculoskeletal: Positive for back pain.  Skin: Negative for rash.  Neurological: Positive for weakness. Negative for dizziness, numbness and headaches.  All other systems reviewed and are negative.     Allergies  Review of patient's allergies indicates no known allergies.  Home Medications   Prior to Admission medications   Medication Sig Start Date End Date Taking? Authorizing Provider  ALPRAZolam Duanne Moron) 0.5 MG tablet Take 1 tablet (0.5 mg total) by mouth 2 (two) times daily as needed for anxiety. 10/31/12   Robbie Lis, MD  antiseptic oral rinse (BIOTENE) LIQD 15 mLs by Mouth Rinse route 2 times daily at 12 noon and 4 pm. 10/30/12   Robbie Lis, MD  docusate sodium 100 MG CAPS Take 100 mg by mouth 2 (two) times daily. 10/30/12   Robbie Lis, MD  HYDROmorphone (DILAUDID) 2 MG tablet Take 1 tablet (2 mg total) by mouth every 3 (three) hours as needed. 10/30/12   Robbie Lis, MD  polyethylene glycol [facility] / Floria Raveling) packet Take 17 g by mouth daily. 10/30/12   Robbie Lis, MD   BP 153/68  Pulse 82  Temp(Src) 99.3 F (37.4 C) (Oral)  Resp 18  SpO2 88% Physical Exam  Nursing note and vitals reviewed. Constitutional: He is oriented to person,  place, and time.  Elderly, no acute distress, obese  HENT:  Head: Normocephalic and atraumatic.  Mouth/Throat: Oropharynx is clear and moist.  Eyes: EOM are normal. Pupils are equal, round, and reactive to light.  Neck:  C. collar in place  Cardiovascular: Normal rate, regular rhythm and normal heart sounds.   No murmur heard. Pulmonary/Chest: Effort normal and breath sounds normal. No respiratory distress. He has no wheezes. He exhibits no tenderness.  Abdominal: Soft. Bowel sounds are normal. There is no tenderness. There is no rebound.  Genitourinary:  Suprapubic catheter with cloudy urine noted in the  catheter bag  Musculoskeletal: He exhibits edema.  2+ bilateral lower extremity edema  Neurological: He is alert and oriented to person, place, and time.  5 out of 5 strength with dorsi and plantar flexion bilaterally, 5 out of 5 grip strength bilaterally, no dysmetria to finger-nose-finger  Skin: Skin is warm and dry.  Psychiatric: He has a normal mood and affect.    ED Course  Procedures (including critical care time) Labs Review Labs Reviewed  CBC WITH DIFFERENTIAL - Abnormal; Notable for the following:    WBC 13.5 (*)    Neutrophils Relative % 84 (*)    Neutro Abs 11.4 (*)    Lymphocytes Relative 5 (*)    Lymphs Abs 0.6 (*)    Monocytes Absolute 1.4 (*)    All other components within normal limits  BASIC METABOLIC PANEL - Abnormal; Notable for the following:    Sodium 132 (*)    Chloride 92 (*)    Glucose, Bld 138 (*)    Creatinine, Ser 1.37 (*)    GFR calc non Af Amer 51 (*)    GFR calc Af Amer 59 (*)    Anion gap 16 (*)    All other components within normal limits  URINALYSIS, ROUTINE W REFLEX MICROSCOPIC - Abnormal; Notable for the following:    Color, Urine AMBER (*)    APPearance TURBID (*)    Hgb urine dipstick LARGE (*)    Ketones, ur 40 (*)    Protein, ur 100 (*)    Nitrite POSITIVE (*)    Leukocytes, UA LARGE (*)    All other components within normal limits  HEPATIC FUNCTION PANEL - Abnormal; Notable for the following:    Albumin 3.2 (*)    All other components within normal limits  URINE MICROSCOPIC-ADD ON - Abnormal; Notable for the following:    Bacteria, UA MANY (*)    All other components within normal limits  CULTURE, BLOOD (ROUTINE X 2)  CULTURE, BLOOD (ROUTINE X 2)  URINE CULTURE  I-STAT CG4 LACTIC ACID, ED    Imaging Review No results found.   EKG Interpretation   Date/Time:  Wednesday June 10 2014 07:39:38 EDT Ventricular Rate:  78 PR Interval:  185 QRS Duration: 103 QT Interval:  382 QTC Calculation: 435 R Axis:   -48 Text  Interpretation:  Sinus rhythm LAD, consider left anterior fascicular  block No significant change was found Confirmed by [facility]  MD, TREY  ([id number]) on 06/10/2014 8:32:46 AM      MDM   Final diagnoses:  None    Patient presents with generalized weakness.  Chronic indwelling foley.  Had similar presentation approx 18 mo ago and was admitted for UTI and decondioning.  INitial temp 99.3.  Rectal 101.2.  Sepsis w/u initiated.  Patient's foley with sediment noted in the line.  WHen attempting a urine sample, unable to draw any fresh  urine from patient's bladder.  Foley bag was replaced.  Patient given 1 L of fluids.  Does have lower extremity edema but clinically appears dry.  Last EF 50-55%.  Normal lactic.  Evidence of leukocytosis and AKI.  Blood cultures drawn and cefepime ordered.  Suspect patient's weakness is from acute infection and deconditioning.  Has history of chronic back pain.    Anticipate admission.  Signed patient out to Dr. Doy Mince pending imaging and urinalysis.  Suspect urinary source of infection.    Merryl Hacker, MD 06/10/14 [phone]

## 2014-06-10 NOTE — Progress Notes (Signed)
Pt states that he has never worn a CPAP machine nor had a sleep study. Pt refuses cpap machine at this time.

## 2014-06-10 NOTE — H&P (Addendum)
[facility] History and Physical  Hurschel Paynter HEN:[id number] DOB: 1946/10/12 DOA: 06/10/2014  Referring physician:  PCP: No PCP Per Patient  Specialists:   Chief Complaint: Generalized weakness  HPI: Prestin Munch is a 68 y.o. male  With a history of hypertension, prostate cancer status post suprapubic, that presented to the emergency department with complaints of generalized weakness. Patient stated been unable to move around since this past Sunday. Patient does state he normally goes to the gym approximately 4 times per week, and had to call his personal trainer to help him get off. Patient was able to walk around his house yesterday with his walker however once in the bathroom patient stated his knees buckled. Patient then fell. He denied hitting his head. Patient also stated he's had poor appetite since this past Sunday. Patient complained of weakness however denied any numbness or tingling, dizziness, chest pain, shortness of breath, abdominal pain. Patient complained of back pain, as well as arthritis pain. Denied any recent travel or sick contacts.  Review of Systems:  Constitutional: Had fever and complains of decreased appetite.  HEENT: Denies photophobia, eye pain, redness, hearing loss, ear pain, congestion, sore throat, rhinorrhea, sneezing, mouth sores, trouble swallowing, neck pain, neck stiffness and tinnitus.   Respiratory: Denies SOB, DOE, cough, chest tightness,  and wheezing.   Cardiovascular: Denies chest pain, palpitations and leg swelling.  Gastrointestinal: Denies nausea, vomiting, abdominal pain, diarrhea, constipation, blood in stool and abdominal distention.  Genitourinary: Denies dysuria, urgency, frequency, hematuria, flank pain and difficulty urinating.  Musculoskeletal: Denies myalgias, back pain, joint swelling, arthralgias and gait problem.  Skin: Denies pallor, rash and wound.  Neurological: Complains of generalized weakness. Hematological: Denies  adenopathy. Easy bruising, personal or family bleeding history  Psychiatric/Behavioral: Denies suicidal ideation, mood changes, confusion, nervousness, sleep disturbance and agitation  Past Medical History  Diagnosis Date  . Malaria   . Cervical myelopathy    Past Surgical History  Procedure Laterality Date  . Back surgery      cervical and thoracic decompression for myelopathy   Social History:  reports that he has never smoked. He does not have any smokeless tobacco history on file. He reports that he does not drink alcohol or use illicit drugs. Lives at home alone.  Uses a walker for ambulation  No Known Allergies  Family History  Problem Relation Age of Onset  . Heart disease Mother   . Heart disease Father   . Dementia Mother   . Dementia Father     Prior to Admission medications   Medication Sig Start Date End Date Taking? Authorizing Provider  aspirin 325 MG tablet Take 325 mg by mouth daily.   Yes Historical Provider, MD  Calcium Carb-Cholecalciferol (CALCIUM 1000 + D PO) Take 1 tablet by mouth daily.   Yes Historical Provider, MD  CINNAMON PO Take 1 capsule by mouth 4 (four) times daily.   Yes Historical Provider, MD  Coenzyme Q10 (CO Q 10 PO) Take 1 tablet by mouth 2 (two) times daily.   Yes Historical Provider, MD  Cyanocobalamin (VITAMIN B-12 PO) Take 2 tablets by mouth daily.   Yes Historical Provider, MD  docusate sodium (COLACE) 100 MG capsule Take 100 mg by mouth 2 (two) times daily as needed for mild constipation.   Yes Historical Provider, MD  furosemide (LASIX) 20 MG tablet Take 20 mg by mouth daily. 05/18/14  Yes Historical Provider, MD  meloxicam (MOBIC) 7.5 MG tablet Take 7.5 mg by mouth daily. 04/11/14  Yes Historical Provider, MD  Multiple Vitamins-Minerals (MULTIVITAMIN PO) Take 1 tablet by mouth daily.   Yes Historical Provider, MD  Multiple Vitamins-Minerals (ZINC PO) Take 1 tablet by mouth daily.   Yes Historical Provider, MD  OVER THE COUNTER MEDICATION  Take 1 tablet by mouth daily. "Grape Seed Extract"   Yes Historical Provider, MD  POTASSIUM PO Take 1 tablet by mouth 2 (two) times daily. OTC   Yes Historical Provider, MD  vitamin E 1000 UNIT capsule Take 1,000 Units by mouth 2 (two) times daily.   Yes Historical Provider, MD   Physical Exam: Filed Vitals:   06/10/14 1002  BP:   Pulse:   Temp: 98.6 F (37 C)  Resp:      General: Well developed, well nourished, NAD, appears stated age  HEENT: NCAT, PERRLA, EOMI, Anicteic Sclera, mucous membranes moist.   Neck: Supple, no JVD, no masses  Cardiovascular: S1 S2 auscultated, no rubs, murmurs or gallops. Regular rate and rhythm.  Respiratory: Clear to auscultation bilaterally with equal chest rise  Abdomen: Soft, obese, nontender, nondistended, + bowel sounds  Extremities: warm dry without cyanosis clubbing or edema  Neuro: AAOx3, cranial nerves grossly intact. Strength 5/5 in patient's upper and lower extremities bilaterally  Skin: Without rashes exudates or nodules.  2 small abrasions noted on medial aspect of right great toe  Psych: Normal affect and demeanor with intact judgement and insight  Labs on Admission:  Basic Metabolic Panel:  Recent Labs Lab 06/10/14 0605  NA 132*  K 4.1  CL 92*  CO2 24  GLUCOSE 138*  BUN 22  CREATININE 1.37*  CALCIUM 8.9   Liver Function Tests:  Recent Labs Lab 06/10/14 0605  AST 33  ALT 19  ALKPHOS 63  BILITOT 1.0  PROT 7.6  ALBUMIN 3.2*   No results found for this basename: LIPASE, AMYLASE,  in the last 168 hours No results found for this basename: AMMONIA,  in the last 168 hours CBC:  Recent Labs Lab 06/10/14 0605  WBC 13.5*  NEUTROABS 11.4*  HGB 14.1  HCT 41.5  MCV 90.0  PLT 164   Cardiac Enzymes: No results found for this basename: CKTOTAL, CKMB, CKMBINDEX, TROPONINI,  in the last 168 hours  BNP (last 3 results) No results found for this basename: PROBNP,  in the last 8760 hours CBG: No results found  for this basename: GLUCAP,  in the last 168 hours  Radiological Exams on Admission: Dg Chest 2 View  06/10/2014   CLINICAL DATA:  Injury, fall, back pain, cough, fever  EXAM: CHEST  2 VIEW  COMPARISON:  10/25/2012  FINDINGS: Chronic elevation RIGHT diaphragm.  Normal heart size, mediastinal contours and pulmonary vascularity.  Chronic bronchitic changes and RIGHT basilar atelectasis versus scarring.  Remaining lungs clear.  No pleural effusion or pneumothorax.  Scattered endplate spur formation thoracic spine.  IMPRESSION: Chronic elevation of RIGHT diaphragm with RIGHT basilar atelectasis versus scarring.  Chronic bronchitic changes without pulmonary infiltrate.   Electronically Signed   By: Lavonia Dana M.D.   On: 06/10/2014 07:48   Ct Head Wo Contrast  06/10/2014   CLINICAL DATA:  Fall with intractable pain. Diffuse headache. Posterior neck tenderness.  EXAM: CT HEAD WITHOUT CONTRAST  CT CERVICAL SPINE WITHOUT CONTRAST  TECHNIQUE: Multidetector CT imaging of the head and cervical spine was performed following the standard protocol without intravenous contrast. Multiplanar CT image reconstructions of the cervical spine were also generated.  COMPARISON:  CT head 10/25/2012 and MR  cervical spine 05/11/2007.  FINDINGS: CT HEAD FINDINGS  No evidence of an acute infarct, acute hemorrhage, mass lesion, mass effect or hydrocephalus. Atrophy. Periventricular low attenuation. There may be a tiny remote infarct in the left cerebellar hemisphere (image 8). Visualized portions of the paranasal sinuses and mastoid air cells are clear. No fracture.  CT CERVICAL SPINE FINDINGS  Alignment is anatomic. Patient is status post C3-6 anterior cervical fusion. No subluxation or fracture. Endplate degenerative changes and uncovertebral hypertrophy are seen at C2-3, C6-7 and C7-T1. Loss of disc space height at C6-7 and C7-T1. Multilevel facet hypertrophy.  At C2-3, severe right neural foraminal narrowing.  At C3-4, moderate right  neural foraminal narrowing.  At C4-5, moderate to severe bilateral neural foraminal narrowing.  At C5-6, moderate to severe bilateral neural foraminal narrowing.  At C6-7, severe bilateral neural foraminal narrowing.  Visualized lung apices are unremarkable. Soft tissues are unremarkable.  IMPRESSION: 1. No acute intracranial abnormality. 2. No acute fracture or subluxation in the cervical spine. 3. Atrophy and chronic microvascular white matter ischemic changes. 4. C3-6 anterior cervical fusion. 5. Multilevel spondylosis.   Electronically Signed   By: Lorin Picket M.D.   On: 06/10/2014 07:38   Ct Cervical Spine Wo Contrast  06/10/2014   CLINICAL DATA:  Fall with intractable pain. Diffuse headache. Posterior neck tenderness.  EXAM: CT HEAD WITHOUT CONTRAST  CT CERVICAL SPINE WITHOUT CONTRAST  TECHNIQUE: Multidetector CT imaging of the head and cervical spine was performed following the standard protocol without intravenous contrast. Multiplanar CT image reconstructions of the cervical spine were also generated.  COMPARISON:  CT head 10/25/2012 and MR cervical spine 05/11/2007.  FINDINGS: CT HEAD FINDINGS  No evidence of an acute infarct, acute hemorrhage, mass lesion, mass effect or hydrocephalus. Atrophy. Periventricular low attenuation. There may be a tiny remote infarct in the left cerebellar hemisphere (image 8). Visualized portions of the paranasal sinuses and mastoid air cells are clear. No fracture.  CT CERVICAL SPINE FINDINGS  Alignment is anatomic. Patient is status post C3-6 anterior cervical fusion. No subluxation or fracture. Endplate degenerative changes and uncovertebral hypertrophy are seen at C2-3, C6-7 and C7-T1. Loss of disc space height at C6-7 and C7-T1. Multilevel facet hypertrophy.  At C2-3, severe right neural foraminal narrowing.  At C3-4, moderate right neural foraminal narrowing.  At C4-5, moderate to severe bilateral neural foraminal narrowing.  At C5-6, moderate to severe bilateral  neural foraminal narrowing.  At C6-7, severe bilateral neural foraminal narrowing.  Visualized lung apices are unremarkable. Soft tissues are unremarkable.  IMPRESSION: 1. No acute intracranial abnormality. 2. No acute fracture or subluxation in the cervical spine. 3. Atrophy and chronic microvascular white matter ischemic changes. 4. C3-6 anterior cervical fusion. 5. Multilevel spondylosis.   Electronically Signed   By: Lorin Picket M.D.   On: 06/10/2014 07:38    EKG: Independently reviewed. Sinus rhythm, rate 78  Assessment/Plan  Sepsis secondary to urinary tract infection -Patient admitted to medical floor -Patient did have leukocytosis and was febrile upon admission -Will place patient on cefepime and IV fluids -UA: Positive nitrites, large leukocytes,TNTC WBC, many bacteria -Pending urine and blood cultures -Spoke to Dr. Matilde Sprang, urology, regarding changing suprapubic catheter, he recommended leaving the catheter in place and he should follow up with his urologist as an outpatient.  Generalized weakness with recent fall -Likely secondary to urinary tract infection -Pending CPK level -Will obtain TSH level -Will consult PT and OT for evaluation and treatment  Hyponatremia and hypochloremia -likely  secondary to poor intake and dehydration vs lasix -Will place patient on IVF and continue to monitor BMP  Hypertension -Will hold Lasix at this time due to patient's sepsis -Blood pressure currently controlled  Diastolic heart failure -Currently compensated -Echocardiogram in January 2014 showed grade 2 diastolic dysfunction, EF of 50-55% -Will continue to monitor intake and output as well as daily weights  History of prostate cancer with suprapubic cath -Patient will need to follow up with his urologist as an outpatient  Arthritis -Continue Mobic, will add on morphine PRN  Obesity -Patient will need to continue his exercise program as well as diet modifications once  discharged  OSA -CPAP QHS  DVT prophylaxis: Lovenox   Code Status: Full  Condition: Guarded  Family Communication: None at bedside. Admission, patients condition and plan of care including tests being ordered have been discussed with the patient, who indicates understanding and agrees with the plan and Code Status.  Disposition Plan: Admitted  Time spent: 60 minutes  Vaudie Engebretsen D.O. [facility] Pager [phone]  If 7PM-7AM, please contact night-coverage [email or web address] Password [email or web address] 06/10/2014, 11:08 AM

## 2014-06-10 NOTE — Progress Notes (Signed)
ANTIBIOTIC CONSULT NOTE - INITIAL  Pharmacy Consult for Cefepime  Indication: rule out sepsis  No Known Allergies   Vital Signs: Temp: 101.6 F (38.7 C) (08/26 0627) Temp src: Rectal (08/26 0627) BP: 134/58 mmHg (08/26 0627) Pulse Rate: 90 (08/26 0627)  Labs:  Recent Labs  06/10/14 0605  WBC 13.5*  HGB 14.1  PLT 164  CREATININE 1.37*    Medical History: Past Medical History  Diagnosis Date  . Malaria   . Cervical myelopathy     Assessment: 68 y/o M here with leukocytosis/fever, to start cefepime per pharmacy. Mild elevation in Scr. Other labs as above.   Plan:  -Cefepime 1g IV q8h -Trend WBC, temp, renal function (may need dose decrease if renal function declines)  Narda Bonds 06/10/2014,7:32 AM

## 2014-06-11 DIAGNOSIS — Y92009 Unspecified place in unspecified non-institutional (private) residence as the place of occurrence of the external cause: Secondary | ICD-10-CM

## 2014-06-11 DIAGNOSIS — W19XXXA Unspecified fall, initial encounter: Secondary | ICD-10-CM

## 2014-06-11 LAB — BASIC METABOLIC PANEL
ANION GAP: 13 (ref 5–15)
BUN: 15 mg/dL (ref 6–23)
CALCIUM: 8.4 mg/dL (ref 8.4–10.5)
CO2: 24 meq/L (ref 19–32)
CREATININE: 1.03 mg/dL (ref 0.50–1.35)
Chloride: 99 mEq/L (ref 96–112)
GFR calc non Af Amer: 72 mL/min — ABNORMAL LOW (ref >90)
GFR, EST AFRICAN AMERICAN: 83 mL/min — AB (ref >90)
Glucose, Bld: 117 mg/dL — ABNORMAL HIGH (ref 70–99)
Potassium: 3.9 mEq/L (ref 3.7–5.3)
Sodium: 136 mEq/L — ABNORMAL LOW (ref 137–147)

## 2014-06-11 LAB — URINE CULTURE

## 2014-06-11 LAB — CBC
HEMATOCRIT: 37.1 % — AB (ref 39.0–52.0)
Hemoglobin: 12.2 g/dL — ABNORMAL LOW (ref 13.0–17.0)
MCH: 30.3 pg (ref 26.0–34.0)
MCHC: 32.9 g/dL (ref 30.0–36.0)
MCV: 92.1 fL (ref 78.0–100.0)
Platelets: 144 10*3/uL — ABNORMAL LOW (ref 150–400)
RBC: 4.03 MIL/uL — ABNORMAL LOW (ref 4.22–5.81)
RDW: 13.7 % (ref 11.5–15.5)
WBC: 8.4 10*3/uL (ref 4.0–10.5)

## 2014-06-11 MED ORDER — ENOXAPARIN SODIUM 80 MG/0.8ML ~~LOC~~ SOLN
70.0000 mg | SUBCUTANEOUS | Status: DC
  Administered 2014-06-11 – 2014-06-12 (×2): 70 mg via SUBCUTANEOUS
  Administered 2014-06-13: 17:00:00 via SUBCUTANEOUS
  Administered 2014-06-14: 70 mg via SUBCUTANEOUS
  Filled 2014-06-11 (×4): qty 0.8

## 2014-06-11 MED ORDER — GLUCERNA SHAKE PO LIQD
237.0000 mL | Freq: Two times a day (BID) | ORAL | Status: DC
  Administered 2014-06-11 – 2014-06-16 (×9): 237 mL via ORAL

## 2014-06-11 MED ORDER — ZOLPIDEM TARTRATE 5 MG PO TABS
5.0000 mg | ORAL_TABLET | Freq: Every evening | ORAL | Status: DC | PRN
  Administered 2014-06-11 – 2014-06-12 (×2): 5 mg via ORAL
  Filled 2014-06-11 (×2): qty 1

## 2014-06-11 MED ORDER — VANCOMYCIN HCL 10 G IV SOLR
1500.0000 mg | Freq: Two times a day (BID) | INTRAVENOUS | Status: DC
  Administered 2014-06-11 – 2014-06-13 (×4): 1500 mg via INTRAVENOUS
  Filled 2014-06-11 (×5): qty 1500

## 2014-06-11 MED ORDER — DIPHENHYDRAMINE HCL 25 MG PO CAPS: 50.0000 mg | ORAL_CAPSULE | Freq: Every evening | ORAL | Status: DC | PRN

## 2014-06-11 MED ORDER — SODIUM CHLORIDE 0.9 % IV SOLN
2500.0000 mg | Freq: Once | INTRAVENOUS | Status: AC
  Administered 2014-06-11: 2500 mg via INTRAVENOUS
  Filled 2014-06-11: qty 2500

## 2014-06-11 NOTE — Progress Notes (Signed)
Nutrition Brief Note  Patient identified on the Malnutrition Screening Tool (MST) Report  Wt Readings from Last 15 Encounters:  06/11/14 309 lb 1.6 oz (140.207 kg)  10/26/12 361 lb 8.9 oz (164 kg)    Body mass index is 36.65 kg/(m^2). Patient meets criteria for class II obesity based on current BMI.   Current diet order is heart healthy, patient is consuming approximately 50% of meals at this time. Labs and medications reviewed. Pt with a history of hypertension, prostate cancer status post suprapubic, that presented to the emergency department with complaints of generalized weakness. Patient stated been unable to move around or eat since this past Sunday.   - Pt reports he usually has a great appetite and eats 2 healthy meals/day - States he's lost 40 pounds in the past 6 months from working out and states this was intentional weight loss - Requested protein shake, will order  No further nutrition interventions warranted at this time. If nutrition issues arise, please consult RD.   Jerry Stable MS, [place], LDN [phone] Pager [phone] Weekend/After Hours Pager

## 2014-06-11 NOTE — Plan of Care (Signed)
Problem: Phase I Progression Outcomes Goal: Voiding-avoid urinary catheter unless indicated Outcome: Not Applicable Date Met:  24/93/24 Has suprapubic cathether

## 2014-06-11 NOTE — Progress Notes (Signed)
Pt. Refused cpap. RT informed pt. To notify if he changes his mind. 

## 2014-06-11 NOTE — Progress Notes (Signed)
[facility]                                                                              Patient Demographics  Jerry [name], is a 68 y.o. male, DOB - 11/28/1945, HEN:[id number]  Admit date - 06/10/2014   Admitting Physician Cristal Ford, DO  Outpatient Primary MD for the patient is Cammy Copa, MD  LOS - 1   Chief Complaint  Patient presents with  . Back Pain  . Fall      HPI on 06/10/2014 Jerry [name] is a 68 y.o. male with a history of hypertension, prostate cancer status post suprapubic, that presented to the emergency department with complaints of generalized weakness. Patient stated he had been unable to move around since this past Sunday. Patient normally goes to the gym, approximately 4 times per week, and had to call his personal trainer to help him get up from the recliner. Patient was able to walk around his house with his walker however once in the bathroom patient stated his knees buckled. Patient then fell. He denied hitting his head. Patient also stated he has had poor appetite since this past Sunday. Patient complained of weakness however denied any numbness or tingling, dizziness, chest pain, shortness of breath, abdominal pain. Patient complained of back pain, as well as arthritis pain. Denied any recent travel or sick contacts.   Assessment & Plan  Sepsis secondary to urinary tract infection vs GPC bacteremia  -Patient did have leukocytosis and was febrile upon admission  -Leukocytosis resolved, continues to be febrile -Continue cefepime and IV fluids  -UA: Positive nitrites, large leukocytes,TNTC WBC, many bacteria  -Pending urine culture pending -Spoke to Dr. Matilde Sprang, urology, regarding changing suprapubic catheter, he recommended leaving the catheter in place and he should follow up with his urologist as an outpatient.  -Blood cultures +GPC X2, pending organism and sensitivities -patient started on vancomycin   Generalized weakness with  recent fall  -Likely secondary to urinary tract infection  -CK 860 -TSH 2.5 -PT and OT consulted for evaluation and treatment   Hyponatremia and hypochloremia  -Improving, likely secondary to poor intake and dehydration vs lasix  -Continue IVF and monitor BMP  Hypertension  -Will hold Lasix at this time due to patient's sepsis  -Blood pressure currently controlled   Diastolic heart failure  -Currently compensated  -Echocardiogram in January 2014 showed grade 2 diastolic dysfunction, EF of 50-55%  -Continue to monitor intake and output as well as daily weights   History of prostate cancer with suprapubic cath  -Patient will need to follow up with his urologist as an outpatient   Arthritis  -Continue Mobic, will add on morphine PRN   Obesity  -Patient will need to continue his exercise program as well as diet modifications once discharged   OSA  -CPAP QHS   Code Status: Full  Family Communication: None at bedside  Disposition Plan: Admitted  Time Spent in minutes   30 minutes  Procedures  None  Consults   Urology, Dr. Matilde Sprang, via phone  DVT Prophylaxis  Lovenox  Lab Results  Component Value Date   PLT 144* 06/11/2014    Medications  Scheduled Meds: . aspirin  EC  325 mg Oral Daily  . ceFEPime (MAXIPIME) IV  1 g Intravenous 3 times per day  . enoxaparin (LOVENOX) injection  70 mg Subcutaneous Q24H  . meloxicam  7.5 mg Oral Daily  . vancomycin  1,500 mg Intravenous Q12H  . vitamin B-12  100 mcg Oral Daily   Continuous Infusions: . sodium chloride 100 mL/hr at 06/10/14 1504   PRN Meds:.acetaminophen, acetaminophen, docusate sodium, morphine injection, ondansetron (ZOFRAN) IV, ondansetron  Antibiotics    Anti-infectives   Start     Dose/Rate Route Frequency Ordered Stop   06/11/14 2000  vancomycin (VANCOCIN) 1,500 mg in sodium chloride 0.9 % 500 mL IVPB     1,500 mg 250 mL/hr over 120 Minutes Intravenous Every 12 hours 06/11/14 0709     06/11/14  0800  vancomycin (VANCOCIN) 2,500 mg in sodium chloride 0.9 % 500 mL IVPB     2,500 mg 250 mL/hr over 120 Minutes Intravenous  Once 06/11/14 0708 06/11/14 1055   06/10/14 0745  ceFEPIme (MAXIPIME) 1 g in dextrose 5 % 50 mL IVPB     1 g 100 mL/hr over 30 Minutes Intravenous 3 times per day 06/10/14 1308          Subjective:   Jerry [name] seen and examined today.  Patient states he continues to feel weak and tired.  He is worried about getting septic shock again.  He denies chest pain, SOB, abdominal pain, headache or dizziness.  He stated he did not sleep well last night.  He is also afraid of getting bed sores.   Objective:   Filed Vitals:   06/10/14 1558 06/10/14 2156 06/11/14 0258 06/11/14 0536  BP: 129/53 112/88 124/49 104/50  Pulse: 80 82 73 55  Temp: 99.3 F (37.4 C) 98.9 F (37.2 C) 99.3 F (37.4 C) 99.8 F (37.7 C)  TempSrc: Oral Oral Oral Oral  Resp: 16 16 18 18   Height:      Weight:    140.207 kg (309 lb 1.6 oz)  SpO2: 94% 92% 93% 93%    Wt Readings from Last 3 Encounters:  06/11/14 140.207 kg (309 lb 1.6 oz)  10/26/12 164 kg (361 lb 8.9 oz)     Intake/Output Summary (Last 24 hours) at 06/11/14 1116 Last data filed at 06/11/14 0545  Gross per 24 hour  Intake 1363.33 ml  Output   1025 ml  Net 338.33 ml    Exam General: Well developed, well nourished, NAD, appears stated age  HEENT: NCAT, mucous membranes moist.  Cardiovascular: S1 S2 auscultated, no rubs, murmurs or gallops. Regular rate and rhythm.  Respiratory: Clear to auscultation bilaterally with equal chest rise  Abdomen: Soft, obese, nontender, nondistended, + bowel sounds, suprapubic cath in place  Extremities: warm dry without cyanosis clubbing or edema  Neuro: AAOx3, no focal deficits Skin: Without rashes exudates or nodules. 2 small abrasions noted on medial aspect of right great toe  Psych: Normal affect and demeanor with intact judgement and insight  Data Review   Micro  Results Recent Results (from the past 240 hour(s))  CULTURE, BLOOD (ROUTINE X 2)     Status: None   Collection Time    06/10/14  6:45 AM      Result Value Ref Range Status   Specimen Description BLOOD FOREARM RIGHT   Final   Special Requests BOTTLES DRAWN AEROBIC AND ANAEROBIC 10CC   Final   Culture  Setup Time     Final   Value: 06/10/2014 11:50  Performed at [facility]     Final   Value: GRAM POSITIVE COCCI IN CLUSTERS     Note: Gram Stain Report Called to,Read Back By and Verified With: IMELDA SAULDIGO 06/11/14 0635A [place]     Performed at [facility]   Report Status PENDING   Incomplete  CULTURE, BLOOD (ROUTINE X 2)     Status: None   Collection Time    06/10/14  6:50 AM      Result Value Ref Range Status   Specimen Description BLOOD LEFT ANTECUBITAL   Final   Special Requests BOTTLES DRAWN AEROBIC AND ANAEROBIC 10CC   Final   Culture  Setup Time     Final   Value: 06/10/2014 11:50     Performed at [facility]   Culture     Final   Value: GRAM POSITIVE COCCI IN CLUSTERS     Note: Gram Stain Report Called to,Read Back By and Verified With: Joaquin Bend 06/11/14 3893T [place]     Performed at [facility]   Report Status PENDING   Incomplete    Radiology Reports Dg Chest 2 View  06/10/2014   CLINICAL DATA:  Injury, fall, back pain, cough, fever  EXAM: CHEST  2 VIEW  COMPARISON:  10/25/2012  FINDINGS: Chronic elevation RIGHT diaphragm.  Normal heart size, mediastinal contours and pulmonary vascularity.  Chronic bronchitic changes and RIGHT basilar atelectasis versus scarring.  Remaining lungs clear.  No pleural effusion or pneumothorax.  Scattered endplate spur formation thoracic spine.  IMPRESSION: Chronic elevation of RIGHT diaphragm with RIGHT basilar atelectasis versus scarring.  Chronic bronchitic changes without pulmonary infiltrate.   Electronically Signed   By: Lavonia Dana M.D.   On: 06/10/2014 07:48   Dg Lumbar Spine  Complete  06/10/2014   CLINICAL DATA:  Fall.  EXAM: LUMBAR SPINE - COMPLETE 4+ VIEW  COMPARISON:  06/04/2007.  FINDINGS: Bilateral nephrolithiasis with particularly prominent staghorn calculus left kidney. Aortoiliac atherosclerotic vascular disease. Diffuse degenerative changes lumbar spine and both hips. Minimal 6 mm anterolisthesis L4 on L5. This is most likely degenerative. No evidence of fracture. Normal bony mineralization.  IMPRESSION: 1. Diffuse degenerative changes lumbar spine with minimal 6 mm anterolisthesis L4 on L5. This is most likely degenerative. No acute bony abnormality. 2. Bilateral nephrolithiasis with 50 prominent staghorn calculus left kidney. 3. Aortoiliac atherosclerotic vascular disease.   Electronically Signed   By: Marcello Moores  Register   On: 06/10/2014 07:51   Ct Head Wo Contrast  06/10/2014   CLINICAL DATA:  Fall with intractable pain. Diffuse headache. Posterior neck tenderness.  EXAM: CT HEAD WITHOUT CONTRAST  CT CERVICAL SPINE WITHOUT CONTRAST  TECHNIQUE: Multidetector CT imaging of the head and cervical spine was performed following the standard protocol without intravenous contrast. Multiplanar CT image reconstructions of the cervical spine were also generated.  COMPARISON:  CT head 10/25/2012 and MR cervical spine 05/11/2007.  FINDINGS: CT HEAD FINDINGS  No evidence of an acute infarct, acute hemorrhage, mass lesion, mass effect or hydrocephalus. Atrophy. Periventricular low attenuation. There may be a tiny remote infarct in the left cerebellar hemisphere (image 8). Visualized portions of the paranasal sinuses and mastoid air cells are clear. No fracture.  CT CERVICAL SPINE FINDINGS  Alignment is anatomic. Patient is status post C3-6 anterior cervical fusion. No subluxation or fracture. Endplate degenerative changes and uncovertebral hypertrophy are seen at C2-3, C6-7 and C7-T1. Loss of disc space height at C6-7 and C7-T1. Multilevel facet hypertrophy.  At C2-3, severe right neural  foraminal narrowing.  At C3-4, moderate right neural foraminal narrowing.  At C4-5, moderate to severe bilateral neural foraminal narrowing.  At C5-6, moderate to severe bilateral neural foraminal narrowing.  At C6-7, severe bilateral neural foraminal narrowing.  Visualized lung apices are unremarkable. Soft tissues are unremarkable.  IMPRESSION: 1. No acute intracranial abnormality. 2. No acute fracture or subluxation in the cervical spine. 3. Atrophy and chronic microvascular white matter ischemic changes. 4. C3-6 anterior cervical fusion. 5. Multilevel spondylosis.   Electronically Signed   By: Lorin Picket M.D.   On: 06/10/2014 07:38   Ct Cervical Spine Wo Contrast  06/10/2014   CLINICAL DATA:  Fall with intractable pain. Diffuse headache. Posterior neck tenderness.  EXAM: CT HEAD WITHOUT CONTRAST  CT CERVICAL SPINE WITHOUT CONTRAST  TECHNIQUE: Multidetector CT imaging of the head and cervical spine was performed following the standard protocol without intravenous contrast. Multiplanar CT image reconstructions of the cervical spine were also generated.  COMPARISON:  CT head 10/25/2012 and MR cervical spine 05/11/2007.  FINDINGS: CT HEAD FINDINGS  No evidence of an acute infarct, acute hemorrhage, mass lesion, mass effect or hydrocephalus. Atrophy. Periventricular low attenuation. There may be a tiny remote infarct in the left cerebellar hemisphere (image 8). Visualized portions of the paranasal sinuses and mastoid air cells are clear. No fracture.  CT CERVICAL SPINE FINDINGS  Alignment is anatomic. Patient is status post C3-6 anterior cervical fusion. No subluxation or fracture. Endplate degenerative changes and uncovertebral hypertrophy are seen at C2-3, C6-7 and C7-T1. Loss of disc space height at C6-7 and C7-T1. Multilevel facet hypertrophy.  At C2-3, severe right neural foraminal narrowing.  At C3-4, moderate right neural foraminal narrowing.  At C4-5, moderate to severe bilateral neural foraminal  narrowing.  At C5-6, moderate to severe bilateral neural foraminal narrowing.  At C6-7, severe bilateral neural foraminal narrowing.  Visualized lung apices are unremarkable. Soft tissues are unremarkable.  IMPRESSION: 1. No acute intracranial abnormality. 2. No acute fracture or subluxation in the cervical spine. 3. Atrophy and chronic microvascular white matter ischemic changes. 4. C3-6 anterior cervical fusion. 5. Multilevel spondylosis.   Electronically Signed   By: Lorin Picket M.D.   On: 06/10/2014 07:38    CBC  Recent Labs Lab 06/10/14 0605 06/10/14 1525 06/11/14 0535  WBC 13.5* 11.2* 8.4  HGB 14.1 13.6 12.2*  HCT 41.5 40.3 37.1*  PLT 164 172 144*  MCV 90.0 91.6 92.1  MCH 30.6 30.9 30.3  MCHC 34.0 33.7 32.9  RDW 13.6 13.5 13.7  LYMPHSABS 0.6*  --   --   MONOABS 1.4*  --   --   EOSABS 0.0  --   --   BASOSABS 0.0  --   --     Chemistries   Recent Labs Lab 06/10/14 0605 06/10/14 1525 06/11/14 0535  NA 132*  --  136*  K 4.1  --  3.9  CL 92*  --  99  CO2 24  --  24  GLUCOSE 138*  --  117*  BUN 22  --  15  CREATININE 1.37* 1.19 1.03  CALCIUM 8.9  --  8.4  AST 33  --   --   ALT 19  --   --   ALKPHOS 63  --   --   BILITOT 1.0  --   --    ------------------------------------------------------------------------------------------------------------------ estimated creatinine clearance is 103.4 ml/min (by C-G formula based on Cr of 1.03). ------------------------------------------------------------------------------------------------------------------ No results found  for this basename: HGBA1C,  in the last 72 hours ------------------------------------------------------------------------------------------------------------------ No results found for this basename: CHOL, HDL, LDLCALC, TRIG, CHOLHDL, LDLDIRECT,  in the last 72 hours ------------------------------------------------------------------------------------------------------------------  Recent Labs   06/10/14 1525  TSH 2.500   ------------------------------------------------------------------------------------------------------------------ No results found for this basename: VITAMINB12, FOLATE, FERRITIN, TIBC, IRON, RETICCTPCT,  in the last 72 hours  Coagulation profile No results found for this basename: INR, PROTIME,  in the last 168 hours  No results found for this basename: DDIMER,  in the last 72 hours  Cardiac Enzymes No results found for this basename: CK, CKMB, TROPONINI, MYOGLOBIN,  in the last 168 hours ------------------------------------------------------------------------------------------------------------------ No components found with this basename: POCBNP,     Kyrah Schiro D.O. on 06/11/2014 at 11:16 AM  Between 7am to 7pm - Pager - [phone]  After 7pm go to [email or web address] - password [email or web address]  And look for the night coverage person covering for me after hours  [facility] Office  [phone]

## 2014-06-11 NOTE — Progress Notes (Signed)
Pt stated he does not wear CPAP and does not want to wear at this time.

## 2014-06-11 NOTE — Progress Notes (Signed)
OT Cancellation Note  Patient Details Name: Jerry [name] MRN: [id number] DOB: 1946/08/31   Cancelled Treatment:    Reason Eval/Treat Not Completed: Other (comment) Pt is Medicare and current D/C plan is SNF per PT as pt lives alone. No apparent immediate acute care OT needs, therefore will defer OT to SNF. If OT eval is needed or d/c plan changes, please call Acute Rehab Dept. at [phone] or text page OT at [phone].   Juluis Rainier [phone] 06/11/2014, 3:21 PM

## 2014-06-11 NOTE — Evaluation (Addendum)
Physical Therapy Evaluation Patient Details Name: Jerry [name] MRN: [id number] DOB: Jan 02, 1946 Today's Date: 06/11/2014   History of Present Illness  68 y.o. male with a history of hypertension, prostate cancer status post suprapubic, that presented to the emergency department with complaints of generalized weakness. Pt adm due to Sepsis secondary to urinary tract infection vs GPC bacteremia.   Clinical Impression  Pt adm due to above. Pt presents with significant decline in functional mobility secondary to deficits indicated below. Pt to benefit from skilled acute PT to address deficits indicated below and maximize functional mobility. Will recommend 2 person (A) for incr mobilization for safety. Pt reluctant to D/C to SNF but does live alone, agreeable to meet with CSW at this time.     Follow Up Recommendations SNF;Supervision/Assistance - 24 hour    Equipment Recommendations  None recommended by PT    Recommendations for Other Services OT consult     Precautions / Restrictions Precautions Precautions: Fall Precaution Comments: recent fall  Restrictions Weight Bearing Restrictions: No      Mobility  Bed Mobility Overal bed mobility: Needs Assistance Bed Mobility: Rolling;Sidelying to Sit Rolling: Min guard Sidelying to sit: Mod assist;HOB elevated       General bed mobility comments: pt with difficulty with bed mobiltiy due to generalized weakness; cues for sequencing and (A) to elevate trunk and use of draw pad to bring Rt hip to EOB; max cues for sequencing   Transfers Overall transfer level: Needs assistance Equipment used: Rolling walker (2 wheeled) Transfers: Sit to/from [facility] Sit to Stand: Mod assist;From elevated surface Stand pivot transfers: Mod assist;From elevated surface       General transfer comment: pt required incr time for transfers and mod (A) to power up to standing; pt with significant fwd flex trunk max cues for hand  placement and safety with transfers; (A) to manage RW with pivotal steps   Ambulation/Gait             General Gait Details: pivotal steps to chair   Stairs            Wheelchair Mobility    Modified Rankin (Stroke Patients Only)       Balance Overall balance assessment: Needs assistance;History of Falls Sitting-balance support: Feet supported;Bilateral upper extremity supported Sitting balance-Leahy Scale: Poor Sitting balance - Comments: relies on UE support for balance    Standing balance support: During functional activity;Bilateral upper extremity supported Standing balance-Leahy Scale: Poor Standing balance comment: relies heavily on RW and (A) for balance                              Pertinent Vitals/Pain Pain Assessment: No/denies pain (c/o "body soreness")    Home Living Family/patient expects to be discharged to:: Private residence Living Arrangements: Alone Available Help at Discharge: Neighbor;Available PRN/intermittently Type of Home: House Home Access: Stairs to enter Entrance Stairs-Rails: None Entrance Stairs-Number of Steps: 1 Home Layout: Two level Home Equipment: Walker - 2 wheels;Walker - standard;Walker - 4 wheels;Shower seat Additional Comments: pt has chair lift for stairs    Prior Function Level of Independence: Independent with assistive device(s)         Comments: ambulates with RW at all times      Hand Dominance        Extremity/Trunk Assessment   Upper Extremity Assessment: Defer to OT evaluation           Lower Extremity  Assessment: Generalized weakness      Cervical / Trunk Assessment: Other exceptions;Kyphotic  Communication   Communication: No difficulties  Cognition Arousal/Alertness: Awake/alert Behavior During Therapy: WFL for tasks assessed/performed Overall Cognitive Status: Within Functional Limits for tasks assessed                      General Comments      Exercises  General Exercises - Lower Extremity Ankle Circles/Pumps: AROM;Strengthening;Both;10 reps;Supine      Assessment/Plan    PT Assessment Patient needs continued PT services  PT Diagnosis Difficulty walking;Generalized weakness   PT Problem List Decreased strength;Decreased activity tolerance;Decreased balance;Decreased mobility;Decreased knowledge of use of DME;Obesity;Decreased safety awareness  PT Treatment Interventions DME instruction;Gait training;Functional mobility training;Therapeutic activities;Therapeutic exercise;Neuromuscular re-education;Balance training;Patient/family education   PT Goals (Current goals can be found in the Care Plan section) Acute Rehab PT Goals Patient Stated Goal: to get stronger and go home  PT Goal Formulation: With patient Time For Goal Achievement: 06/18/14 Potential to Achieve Goals: Fair    Frequency Min 3X/week   Barriers to discharge Decreased caregiver support;Inaccessible home environment lives alone     Co-evaluation               End of Session Equipment Utilized During Treatment: Gait belt Activity Tolerance: Patient limited by fatigue Patient left: in chair;with call bell/phone within reach Nurse Communication: Need for lift equipment;Mobility status         Time: 1610-9604 PT Time Calculation (min): 28 min   Charges:   PT Evaluation $Initial PT Evaluation Tier I: 1 Procedure PT Treatments $Therapeutic Activity: 23-37 mins   PT G CodesGustavus Bryant, [place]  [phone] 06/11/2014, 3:09 PM

## 2014-06-11 NOTE — Progress Notes (Signed)
ANTIBIOTIC CONSULT NOTE - INITIAL  Pharmacy Consult for vancomycin  Indication: G+ in blood cx's   No Known Allergies  Patient Measurements: Height: 6\' 5"  (195.6 cm) Weight: 309 lb 1.6 oz (140.207 kg) IBW/kg (Calculated) : 89.1 Adjusted Body Weight:   Vital Signs: Temp: 99.8 F (37.7 C) (08/27 0536) Temp src: Oral (08/27 0536) BP: 104/50 mmHg (08/27 0536) Pulse Rate: 55 (08/27 0536) Intake/Output from previous day: 08/26 0701 - 08/27 0700 In: 2363.3 [P.O.:120; I.V.:1193.3; IV Piggyback:1050] Out: 1025 [Urine:1025] Intake/Output from this shift:    Labs:  Recent Labs  06/10/14 0605 06/10/14 1525 06/11/14 0535  WBC 13.5* 11.2* 8.4  HGB 14.1 13.6 12.2*  PLT 164 172 144*  CREATININE 1.37* 1.19 1.03   Estimated Creatinine Clearance: 103.4 ml/min (by C-G formula based on Cr of 1.03). No results found for this basename: VANCOTROUGH, VANCOPEAK, VANCORANDOM, [place], GENTPEAK, GENTRANDOM, TOBRATROUGH, TOBRAPEAK, TOBRARND, AMIKACINPEAK, AMIKACINTROU, AMIKACIN,  in the last 72 hours   Microbiology: Recent Results (from the past 720 hour(s))  CULTURE, BLOOD (ROUTINE X 2)     Status: None   Collection Time    06/10/14  6:45 AM      Result Value Ref Range Status   Specimen Description BLOOD FOREARM RIGHT   Final   Special Requests BOTTLES DRAWN AEROBIC AND ANAEROBIC 10CC   Final   Culture  Setup Time     Final   Value: 06/10/2014 11:50     Performed at [facility]   Culture     Final   Value: GRAM POSITIVE COCCI IN CLUSTERS     Note: Gram Stain Report Called to,Read Back By and Verified With: IMELDA SAULDIGO 06/11/14 0635A [place]     Performed at [facility]   Report Status PENDING   Incomplete  CULTURE, BLOOD (ROUTINE X 2)     Status: None   Collection Time    06/10/14  6:50 AM      Result Value Ref Range Status   Specimen Description BLOOD LEFT ANTECUBITAL   Final   Special Requests BOTTLES DRAWN AEROBIC AND ANAEROBIC 10CC   Final   Culture  Setup  Time     Final   Value: 06/10/2014 11:50     Performed at [facility]   Culture     Final   Value: GRAM POSITIVE COCCI IN CLUSTERS     Note: Gram Stain Report Called to,Read Back By and Verified With: Joaquin Bend 06/11/14 4268T [place]     Performed at [facility]   Report Status PENDING   Incomplete    Medical History: Past Medical History  Diagnosis Date  . Malaria   . Cervical myelopathy   . Coma 1968    "for 2 wks"  . MHDQQIWL(798.9)     "monthly" (06/10/2014)  . Arthritis     "hands, knees, back, legs" (06/10/2014)  . PTSD (post-traumatic stress disorder)     "I'm all over that now"    Medications:  See mar  Assessment: Blood cx's with G+ cocci in pairs. vanc for targeted coverage.  Goal of Therapy:  Vancomycin trough level 15-20 mcg/ml  Plan:  Vancomycin 2.5gm x1 then 1500 mg q12h   Curlene Dolphin 06/11/2014,7:06 AM

## 2014-06-11 NOTE — Progress Notes (Signed)
CRITICAL VALUE ALERT  Critical value received:  Positive blood culture-- gram + cocci and clusters.  Date of notification:  06/11/2014  Time of notification:  0640  Critical value read back:Yes.    Nurse who received alert:  Thad Ranger  MD notified (1st page):  K.Schorr,NP  Time of first page:  0640  MD notified (2nd page):  Time of second page:  Responding MD:  K.Schorr,NP  Time MD responded:  [phone]

## 2014-06-12 DIAGNOSIS — R5383 Other fatigue: Secondary | ICD-10-CM

## 2014-06-12 DIAGNOSIS — B958 Unspecified staphylococcus as the cause of diseases classified elsewhere: Secondary | ICD-10-CM

## 2014-06-12 DIAGNOSIS — R7881 Bacteremia: Secondary | ICD-10-CM

## 2014-06-12 DIAGNOSIS — R5381 Other malaise: Secondary | ICD-10-CM

## 2014-06-12 DIAGNOSIS — C61 Malignant neoplasm of prostate: Secondary | ICD-10-CM

## 2014-06-12 DIAGNOSIS — IMO0001 Reserved for inherently not codable concepts without codable children: Secondary | ICD-10-CM

## 2014-06-12 DIAGNOSIS — I369 Nonrheumatic tricuspid valve disorder, unspecified: Secondary | ICD-10-CM

## 2014-06-12 LAB — BASIC METABOLIC PANEL
Anion gap: 9 (ref 5–15)
BUN: 13 mg/dL (ref 6–23)
CALCIUM: 8.5 mg/dL (ref 8.4–10.5)
CO2: 27 mEq/L (ref 19–32)
Chloride: 105 mEq/L (ref 96–112)
Creatinine, Ser: 0.94 mg/dL (ref 0.50–1.35)
GFR calc Af Amer: >90 mL/min (ref >90)
GFR, EST NON AFRICAN AMERICAN: 83 mL/min — AB (ref >90)
GLUCOSE: 119 mg/dL — AB (ref 70–99)
Potassium: 4 mEq/L (ref 3.7–5.3)
SODIUM: 141 meq/L (ref 137–147)

## 2014-06-12 LAB — CBC
HEMATOCRIT: 38.2 % — AB (ref 39.0–52.0)
HEMOGLOBIN: 12.6 g/dL — AB (ref 13.0–17.0)
MCH: 30.5 pg (ref 26.0–34.0)
MCHC: 33 g/dL (ref 30.0–36.0)
MCV: 92.5 fL (ref 78.0–100.0)
Platelets: 142 10*3/uL — ABNORMAL LOW (ref 150–400)
RBC: 4.13 MIL/uL — AB (ref 4.22–5.81)
RDW: 13.6 % (ref 11.5–15.5)
WBC: 7.4 10*3/uL (ref 4.0–10.5)

## 2014-06-12 MED ORDER — CEFAZOLIN SODIUM-DEXTROSE 2-3 GM-% IV SOLR
2.0000 g | Freq: Three times a day (TID) | INTRAVENOUS | Status: DC
  Administered 2014-06-12 – 2014-06-16 (×13): 2 g via INTRAVENOUS
  Filled 2014-06-12 (×16): qty 50

## 2014-06-12 NOTE — Progress Notes (Signed)
[facility]                                                                              Patient Demographics  Jerry [name], is a 68 y.o. male, DOB - [birth year], QHU:[id number]  Admit date - 06/10/2014   Admitting Physician Cristal Ford, DO  Outpatient Primary MD for the patient is Cammy Copa, MD  LOS - 2   Chief Complaint  Patient presents with  . Back Pain  . Fall      HPI on 06/10/2014 Jerry [name] is a 68 y.o. male with a history of hypertension, prostate cancer status post suprapubic, that presented to the emergency department with complaints of generalized weakness. Patient stated he had been unable to move around since this past Sunday. Patient normally goes to the gym, approximately 4 times per week, and had to call his personal trainer to help him get up from the recliner. Patient was able to walk around his house with his walker however once in the bathroom patient stated his knees buckled. Patient then fell. He denied hitting his head. Patient also stated he has had poor appetite since this past Sunday. Patient complained of weakness however denied any numbness or tingling, dizziness, chest pain, shortness of breath, abdominal pain. Patient complained of back pain, as well as arthritis pain. Denied any recent travel or sick contacts.   Assessment & Plan  Sepsis secondary to urinary tract infection vs GPC bacteremia  -Patient did have leukocytosis and was febrile upon admission  -Leukocytosis resolved, continues to be febrile -Continue cefepime and IV fluids  -UA: Positive nitrites, large leukocytes,TNTC WBC, many bacteria  -Urine culture >100K colonies, mutliple bacterial morphotypes -Spoke to Dr. Matilde Sprang, urology, regarding changing suprapubic catheter, he recommended leaving the catheter in place and he should follow up with his urologist as an outpatient.  -Blood cultures  +staph aureus, currently on vancomycin -ID consulted and  appreciated -Echocardiogram ordered  Generalized weakness with recent fall  -Likely secondary to urinary tract infection  -CK 860 -TSH 2.5 -PT and OT consulted for evaluation and treatment   Hyponatremia and hypochloremia  -Resolved, likely secondary to poor intake and dehydration vs lasix   Hypertension  -Will hold Lasix at this time due to patient's sepsis  -Blood pressure currently controlled   Diastolic heart failure  -Currently compensated  -Echocardiogram in January 2014 showed grade 2 diastolic dysfunction, EF of 50-55%  -Continue to monitor intake and output as well as daily weights  -Echocardiogram ordered  History of prostate cancer with suprapubic cath  -Patient will need to follow up with his urologist as an outpatient   Arthritis  -Continue Mobic, will add on morphine PRN   Obesity  -Patient will need to continue his exercise program as well as diet modifications once discharged   OSA  -CPAP QHS   Code Status: Full  Family Communication: None at bedside  Disposition Plan: Admitted  Time Spent in minutes   25 minutes  Procedures  None  Consults   Urology, Dr. Matilde Sprang, via phone Infectious disease  DVT Prophylaxis  Lovenox  Lab Results  Component Value Date   PLT 142* 06/12/2014    Medications  Scheduled  Meds: . aspirin EC  325 mg Oral Daily  .  ceFAZolin (ANCEF) IV  2 g Intravenous 3 times per day  . enoxaparin (LOVENOX) injection  70 mg Subcutaneous Q24H  . feeding supplement (GLUCERNA SHAKE)  237 mL Oral BID BM  . meloxicam  7.5 mg Oral Daily  . vancomycin  1,500 mg Intravenous Q12H  . vitamin B-12  100 mcg Oral Daily   Continuous Infusions:   PRN Meds:.acetaminophen, acetaminophen, docusate sodium, morphine injection, ondansetron (ZOFRAN) IV, ondansetron, zolpidem  Antibiotics    Anti-infectives   Start     Dose/Rate Route Frequency Ordered Stop   06/12/14 1130  ceFAZolin (ANCEF) IVPB 2 g/50 mL premix     2 g 100 mL/hr over  30 Minutes Intravenous 3 times per day 06/12/14 1035     06/11/14 2000  vancomycin (VANCOCIN) 1,500 mg in sodium chloride 0.9 % 500 mL IVPB     1,500 mg 250 mL/hr over 120 Minutes Intravenous Every 12 hours 06/11/14 0709     06/11/14 0800  vancomycin (VANCOCIN) 2,500 mg in sodium chloride 0.9 % 500 mL IVPB     2,500 mg 250 mL/hr over 120 Minutes Intravenous  Once 06/11/14 0708 06/11/14 1055   06/10/14 0745  ceFEPIme (MAXIPIME) 1 g in dextrose 5 % 50 mL IVPB  Status:  Discontinued     1 g 100 mL/hr over 30 Minutes Intravenous 3 times per day 06/10/14 0734 06/12/14 7628        Subjective:   [place] seen and examined today.  Patient states that he feels swollen, particularly in his hands and feet.  He does complain of arthritis.  He denies chest pain, SOB, abdominal pain, headache or dizziness.    Objective:   Filed Vitals:   06/11/14 1000 06/11/14 1430 06/11/14 2123 06/12/14 0536  BP: 101/49 110/88 122/68 110/51  Pulse: 71 82 77 61  Temp: 98.7 F (37.1 C) 98.4 F (36.9 C) 98.3 F (36.8 C) 97.9 F (36.6 C)  TempSrc: Oral Oral Oral Oral  Resp: 18 18 19 18   Height:      Weight:    149.687 kg (330 lb)  SpO2: 98% 98% 98% 92%    Wt Readings from Last 3 Encounters:  06/12/14 149.687 kg (330 lb)  10/26/12 164 kg (361 lb 8.9 oz)     Intake/Output Summary (Last 24 hours) at 06/12/14 1237 Last data filed at 06/12/14 0538  Gross per 24 hour  Intake   1630 ml  Output   3300 ml  Net  -1670 ml    Exam General: Well developed, well nourished, NAD, appears stated age  HEENT: NCAT, mucous membranes moist.  Cardiovascular: S1 S2 auscultated, no rubs, murmurs or gallops. Regular rate and rhythm.  Respiratory: Clear to auscultation bilaterally with equal chest rise  Abdomen: Soft, obese, nontender, nondistended, + bowel sounds, suprapubic cath in place  Extremities: warm dry without cyanosis clubbing or edema Neuro: AAOx3, no focal deficits  Psych: Normal affect and demeanor    Data Review   Micro Results Recent Results (from the past 240 hour(s))  CULTURE, BLOOD (ROUTINE X 2)     Status: None   Collection Time    06/10/14  6:45 AM      Result Value Ref Range Status   Specimen Description BLOOD FOREARM RIGHT   Final   Special Requests BOTTLES DRAWN AEROBIC AND ANAEROBIC 10CC   Final   Culture  Setup Time     Final  Value: 06/10/2014 11:50     Performed at [facility]   Culture     Final   Value: STAPHYLOCOCCUS AUREUS     Note: RIFAMPIN AND GENTAMICIN SHOULD NOT BE USED AS SINGLE DRUGS FOR TREATMENT OF STAPH INFECTIONS.     Note: Gram Stain Report Called to,Read Back By and Verified With: IMELDA SAULDIGO 06/11/14 0635A [place]     Performed at [facility]   Report Status PENDING   Incomplete  CULTURE, BLOOD (ROUTINE X 2)     Status: None   Collection Time    06/10/14  6:50 AM      Result Value Ref Range Status   Specimen Description BLOOD LEFT ANTECUBITAL   Final   Special Requests BOTTLES DRAWN AEROBIC AND ANAEROBIC 10CC   Final   Culture  Setup Time     Final   Value: 06/10/2014 11:50     Performed at [facility]   Culture     Final   Value: STAPHYLOCOCCUS AUREUS     Note: Gram Stain Report Called to,Read Back By and Verified With: Joaquin Bend 06/11/14 0635A [place]     Performed at [facility]   Report Status PENDING   Incomplete  URINE CULTURE     Status: None   Collection Time    06/10/14  7:36 AM      Result Value Ref Range Status   Specimen Description URINE, RANDOM   Final   Special Requests NONE   Final   Culture  Setup Time     Final   Value: 06/10/2014 11:57     Performed at [place]     Final   Value: >=100,000 COLONIES/ML     Performed at [facility]   Culture     Final   Value: Multiple bacterial morphotypes present, none predominant. Suggest appropriate recollection if clinically indicated.     Performed at [facility]   Report Status 06/11/2014  FINAL   Final    Radiology Reports Dg Chest 2 View  06/10/2014   CLINICAL DATA:  Injury, fall, back pain, cough, fever  EXAM: CHEST  2 VIEW  COMPARISON:  10/25/2012  FINDINGS: Chronic elevation RIGHT diaphragm.  Normal heart size, mediastinal contours and pulmonary vascularity.  Chronic bronchitic changes and RIGHT basilar atelectasis versus scarring.  Remaining lungs clear.  No pleural effusion or pneumothorax.  Scattered endplate spur formation thoracic spine.  IMPRESSION: Chronic elevation of RIGHT diaphragm with RIGHT basilar atelectasis versus scarring.  Chronic bronchitic changes without pulmonary infiltrate.   Electronically Signed   By: Lavonia Dana M.D.   On: 06/10/2014 07:48   Dg Lumbar Spine Complete  06/10/2014   CLINICAL DATA:  Fall.  EXAM: LUMBAR SPINE - COMPLETE 4+ VIEW  COMPARISON:  06/04/2007.  FINDINGS: Bilateral nephrolithiasis with particularly prominent staghorn calculus left kidney. Aortoiliac atherosclerotic vascular disease. Diffuse degenerative changes lumbar spine and both hips. Minimal 6 mm anterolisthesis L4 on L5. This is most likely degenerative. No evidence of fracture. Normal bony mineralization.  IMPRESSION: 1. Diffuse degenerative changes lumbar spine with minimal 6 mm anterolisthesis L4 on L5. This is most likely degenerative. No acute bony abnormality. 2. Bilateral nephrolithiasis with 50 prominent staghorn calculus left kidney. 3. Aortoiliac atherosclerotic vascular disease.   Electronically Signed   By: Marcello Moores  Register   On: 06/10/2014 07:51   Ct Head Wo Contrast  06/10/2014   CLINICAL DATA:  Fall with intractable pain.  Diffuse headache. Posterior neck tenderness.  EXAM: CT HEAD WITHOUT CONTRAST  CT CERVICAL SPINE WITHOUT CONTRAST  TECHNIQUE: Multidetector CT imaging of the head and cervical spine was performed following the standard protocol without intravenous contrast. Multiplanar CT image reconstructions of the cervical spine were also generated.  COMPARISON:  CT  head 10/25/2012 and MR cervical spine 05/11/2007.  FINDINGS: CT HEAD FINDINGS  No evidence of an acute infarct, acute hemorrhage, mass lesion, mass effect or hydrocephalus. Atrophy. Periventricular low attenuation. There may be a tiny remote infarct in the left cerebellar hemisphere (image 8). Visualized portions of the paranasal sinuses and mastoid air cells are clear. No fracture.  CT CERVICAL SPINE FINDINGS  Alignment is anatomic. Patient is status post C3-6 anterior cervical fusion. No subluxation or fracture. Endplate degenerative changes and uncovertebral hypertrophy are seen at C2-3, C6-7 and C7-T1. Loss of disc space height at C6-7 and C7-T1. Multilevel facet hypertrophy.  At C2-3, severe right neural foraminal narrowing.  At C3-4, moderate right neural foraminal narrowing.  At C4-5, moderate to severe bilateral neural foraminal narrowing.  At C5-6, moderate to severe bilateral neural foraminal narrowing.  At C6-7, severe bilateral neural foraminal narrowing.  Visualized lung apices are unremarkable. Soft tissues are unremarkable.  IMPRESSION: 1. No acute intracranial abnormality. 2. No acute fracture or subluxation in the cervical spine. 3. Atrophy and chronic microvascular white matter ischemic changes. 4. C3-6 anterior cervical fusion. 5. Multilevel spondylosis.   Electronically Signed   By: Lorin Picket M.D.   On: 06/10/2014 07:38   Ct Cervical Spine Wo Contrast  06/10/2014   CLINICAL DATA:  Fall with intractable pain. Diffuse headache. Posterior neck tenderness.  EXAM: CT HEAD WITHOUT CONTRAST  CT CERVICAL SPINE WITHOUT CONTRAST  TECHNIQUE: Multidetector CT imaging of the head and cervical spine was performed following the standard protocol without intravenous contrast. Multiplanar CT image reconstructions of the cervical spine were also generated.  COMPARISON:  CT head 10/25/2012 and MR cervical spine 05/11/2007.  FINDINGS: CT HEAD FINDINGS  No evidence of an acute infarct, acute hemorrhage, mass  lesion, mass effect or hydrocephalus. Atrophy. Periventricular low attenuation. There may be a tiny remote infarct in the left cerebellar hemisphere (image 8). Visualized portions of the paranasal sinuses and mastoid air cells are clear. No fracture.  CT CERVICAL SPINE FINDINGS  Alignment is anatomic. Patient is status post C3-6 anterior cervical fusion. No subluxation or fracture. Endplate degenerative changes and uncovertebral hypertrophy are seen at C2-3, C6-7 and C7-T1. Loss of disc space height at C6-7 and C7-T1. Multilevel facet hypertrophy.  At C2-3, severe right neural foraminal narrowing.  At C3-4, moderate right neural foraminal narrowing.  At C4-5, moderate to severe bilateral neural foraminal narrowing.  At C5-6, moderate to severe bilateral neural foraminal narrowing.  At C6-7, severe bilateral neural foraminal narrowing.  Visualized lung apices are unremarkable. Soft tissues are unremarkable.  IMPRESSION: 1. No acute intracranial abnormality. 2. No acute fracture or subluxation in the cervical spine. 3. Atrophy and chronic microvascular white matter ischemic changes. 4. C3-6 anterior cervical fusion. 5. Multilevel spondylosis.   Electronically Signed   By: Lorin Picket M.D.   On: 06/10/2014 07:38    CBC  Recent Labs Lab 06/10/14 0605 06/10/14 1525 06/11/14 0535 06/12/14 0548  WBC 13.5* 11.2* 8.4 7.4  HGB 14.1 13.6 12.2* 12.6*  HCT 41.5 40.3 37.1* 38.2*  PLT 164 172 144* 142*  MCV 90.0 91.6 92.1 92.5  MCH 30.6 30.9 30.3 30.5  MCHC 34.0 33.7 32.9 33.0  RDW 13.6 13.5 13.7 13.6  LYMPHSABS 0.6*  --   --   --   MONOABS 1.4*  --   --   --   EOSABS 0.0  --   --   --   BASOSABS 0.0  --   --   --     Chemistries   Recent Labs Lab 06/10/14 0605 06/10/14 1525 06/11/14 0535 06/12/14 0548  NA 132*  --  136* 141  K 4.1  --  3.9 4.0  CL 92*  --  99 105  CO2 24  --  24 27  GLUCOSE 138*  --  117* 119*  BUN 22  --  15 13  CREATININE 1.37* 1.19 1.03 0.94  CALCIUM 8.9  --  8.4 8.5   AST 33  --   --   --   ALT 19  --   --   --   ALKPHOS 63  --   --   --   BILITOT 1.0  --   --   --    ------------------------------------------------------------------------------------------------------------------ estimated creatinine clearance is 117.2 ml/min (by C-G formula based on Cr of 0.94). ------------------------------------------------------------------------------------------------------------------ No results found for this basename: HGBA1C,  in the last 72 hours ------------------------------------------------------------------------------------------------------------------ No results found for this basename: CHOL, HDL, LDLCALC, TRIG, CHOLHDL, LDLDIRECT,  in the last 72 hours ------------------------------------------------------------------------------------------------------------------  Recent Labs  06/10/14 1525  TSH 2.500   ------------------------------------------------------------------------------------------------------------------ No results found for this basename: VITAMINB12, FOLATE, FERRITIN, TIBC, IRON, RETICCTPCT,  in the last 72 hours  Coagulation profile No results found for this basename: INR, PROTIME,  in the last 168 hours  No results found for this basename: DDIMER,  in the last 72 hours  Cardiac Enzymes No results found for this basename: CK, CKMB, TROPONINI, MYOGLOBIN,  in the last 168 hours ------------------------------------------------------------------------------------------------------------------ No components found with this basename: POCBNP,     Romano Stigger D.O. on 06/12/2014 at 12:37 PM  Between 7am to 7pm - Pager - [phone]  After 7pm go to [email or web address] - password [email or web address]  And look for the night coverage person covering for me after hours  [facility] Office  [phone]

## 2014-06-12 NOTE — Progress Notes (Signed)
  Echocardiogram 2D Echocardiogram has been performed.  Haley Roza FRANCES 06/12/2014, 3:42 PM

## 2014-06-12 NOTE — Consult Note (Signed)
[place] for [facility]     Reason for Consult: Staph bacteremia    Referring Physician: [facility]/Dr. Ree Kida  Active Problems:   Sepsis   Sepsis secondary to UTI   . aspirin EC  325 mg Oral Daily  . enoxaparin (LOVENOX) injection  70 mg Subcutaneous Q24H  . feeding supplement (GLUCERNA SHAKE)  237 mL Oral BID BM  . meloxicam  7.5 mg Oral Daily  . vancomycin  1,500 mg Intravenous Q12H  . vitamin B-12  100 mcg Oral Daily    Recommendations: vanco and cefazolin pending sensitivities Repeat blood cultures TTE   Assessment: He has Staph bacteremia, prostate cancer, suprapubic catheter and came in with weakness, myalgias, leukocytosis c/w bacteremia.    Antibiotics: As above  HPI: Jerry [name] is a 68 y.o. male with prostate cancer came in with above complaints.  History of sepsis from UTI in the past.  Now with 2/2 blood cultures with Staph aureus.  No chest pain or back pain.  Has had back surgery in the past.  No chills, no fever.  WBC 13.5 on admission, now normalized.     Review of Systems: A comprehensive review of systems was negative.  Past Medical History  Diagnosis Date  . Malaria   . Cervical myelopathy   . Coma 1968    "for 2 wks"  . XQJJHERD(408.1)     "monthly" (06/10/2014)  . Arthritis     "hands, knees, back, legs" (06/10/2014)  . PTSD (post-traumatic stress disorder)     "I'm all over that now"    History  Substance Use Topics  . Smoking status: Never Smoker   . Smokeless tobacco: Never Used     Comment: "smoked cigarettes  for ~ 1 wk in [place]"  . Alcohol Use: No    Family History  Problem Relation Age of Onset  . Heart disease Mother   . Heart disease Father   . Dementia Mother   . Dementia Father    No Known Allergies  OBJECTIVE: Blood pressure 110/51, pulse 61, temperature 97.9 F (36.6 C), temperature source Oral, resp. rate 18, height 6\' 5"  (1.956 m), weight 330 lb (149.687 kg), SpO2 92.00%. General:  awake, in bed, nad Skin: no rashes Lungs: CTA B, anterior exam Cor: RRR Abdomen: soft, nt, obese Ext: no edema  Microbiology: Recent Results (from the past 240 hour(s))  CULTURE, BLOOD (ROUTINE X 2)     Status: None   Collection Time    06/10/14  6:45 AM      Result Value Ref Range Status   Specimen Description BLOOD FOREARM RIGHT   Final   Special Requests BOTTLES DRAWN AEROBIC AND ANAEROBIC 10CC   Final   Culture  Setup Time     Final   Value: 06/10/2014 11:50     Performed at [facility]   Culture     Final   Value: STAPHYLOCOCCUS AUREUS     Note: RIFAMPIN AND GENTAMICIN SHOULD NOT BE USED AS SINGLE DRUGS FOR TREATMENT OF STAPH INFECTIONS.     Note: Gram Stain Report Called to,Read Back By and Verified With: IMELDA SAULDIGO 06/11/14 0635A [place]     Performed at [facility]   Report Status PENDING   Incomplete  CULTURE, BLOOD (ROUTINE X 2)     Status: None   Collection Time    06/10/14  6:50 AM      Result Value Ref Range Status   Specimen Description BLOOD LEFT  ANTECUBITAL   Final   Special Requests BOTTLES DRAWN AEROBIC AND ANAEROBIC 10CC   Final   Culture  Setup Time     Final   Value: 06/10/2014 11:50     Performed at [facility]   Culture     Final   Value: STAPHYLOCOCCUS AUREUS     Note: Gram Stain Report Called to,Read Back By and Verified With: Joaquin Bend 06/11/14 0635A [place]     Performed at [facility]   Report Status PENDING   Incomplete  URINE CULTURE     Status: None   Collection Time    06/10/14  7:36 AM      Result Value Ref Range Status   Specimen Description URINE, RANDOM   Final   Special Requests NONE   Final   Culture  Setup Time     Final   Value: 06/10/2014 11:57     Performed at [place]     Final   Value: >=100,000 COLONIES/ML     Performed at [facility]   Culture     Final   Value: Multiple bacterial morphotypes present, none predominant. Suggest appropriate  recollection if clinically indicated.     Performed at [facility]   Report Status 06/11/2014 FINAL   Final    Scharlene Gloss, [place] [facility] [email or web address] [phone] pager  [phone] cell 06/12/2014, 10:21 AM

## 2014-06-12 NOTE — Progress Notes (Signed)
Physical Therapy Treatment Patient Details Name: Jerry [name] MRN: [id number] DOB: April 22, 1946 Today's Date: 06/12/2014    History of Present Illness 68 y.o. male with a history of hypertension, prostate cancer status post suprapubic, that presented to the emergency department with complaints of generalized weakness. Pt adm due to Sepsis secondary to urinary tract infection vs GPC bacteremia.     PT Comments    Pt continues to have incr difficulty with bed mobility. Demo decreased activity tolerance that limited mobility today. Pt continues to require 2 person (A) for safety with increased mobility due to fall risk. Cont to recommend SNF upon acute D/C due to lack of caregiver support. Encouraged pt to be OOB with nursing for all meals over weekend.   Follow Up Recommendations  SNF;Supervision/Assistance - 24 hour     Equipment Recommendations  None recommended by PT    Recommendations for Other Services OT consult     Precautions / Restrictions Precautions Precautions: Fall Precaution Comments: recent fall  Restrictions Weight Bearing Restrictions: No    Mobility  Bed Mobility Overal bed mobility: Needs Assistance Bed Mobility: Supine to Sit     Supine to sit: Mod assist;HOB elevated     General bed mobility comments: pt with difficulty advancing LEs off EOB while elevating trunk; requires (A) to fully advance LEs off EOB and handheld (A) to support trunk to sitting position   Transfers Overall transfer level: Needs assistance Equipment used: Rolling walker (2 wheeled) Transfers: Sit to/from Stand Sit to Stand: Min assist;From elevated surface         General transfer comment: cues for anterior weightshifting to (A) with powering up; cues for hand placement and (A) to block RW with transfer  Ambulation/Gait Ambulation/Gait assistance: +2 safety/equipment;Min assist Ambulation Distance (Feet): 50 Feet Assistive device: Rolling walker (2 wheeled) Gait  Pattern/deviations: Step-through pattern;Decreased step length - left;Shuffle;Trunk flexed;Wide base of support Gait velocity: very decreased Gait velocity interpretation: Below normal speed for age/gender General Gait Details: 2 person for safety to follow with chair; cues for upright posture and increased step through gt pattern; pt fatigued and c/o dizzines, limiting mobility today    Stairs            Wheelchair Mobility    Modified Rankin (Stroke Patients Only)       Balance Overall balance assessment: Needs assistance;History of Falls Sitting-balance support: Feet supported Sitting balance-Leahy Scale: Fair     Standing balance support: During functional activity;Bilateral upper extremity supported Standing balance-Leahy Scale: Poor Standing balance comment: relies on UE support heavily                    Cognition Arousal/Alertness: Awake/alert Behavior During Therapy: WFL for tasks assessed/performed Overall Cognitive Status: Within Functional Limits for tasks assessed                      Exercises      General Comments        Pertinent Vitals/Pain Pain Assessment: 0-10 Pain Score: 4  Pain Location: "chronic back back" Pain Descriptors / Indicators: Constant;Aching Pain Intervention(s): Monitored during session;Repositioned    Home Living                      Prior Function            PT Goals (current goals can now be found in the care plan section) Acute Rehab PT Goals Patient Stated Goal: i just want to walk  around and go outside  PT Goal Formulation: With patient Time For Goal Achievement: 06/18/14 Potential to Achieve Goals: Fair Progress towards PT goals: Progressing toward goals    Frequency  Min 3X/week    PT Plan Current plan remains appropriate    Co-evaluation             End of Session Equipment Utilized During Treatment: Gait belt Activity Tolerance: Patient limited by fatigue Patient left:  in bed;with call bell/phone within reach     Time: 1052-1116 PT Time Calculation (min): 24 min  Charges:  [employer] Training: 23-37 mins                    G CodesMelina Modena [place], [place] 06/12/2014, 2:14 PM

## 2014-06-13 ENCOUNTER — Inpatient Hospital Stay (HOSPITAL_COMMUNITY): Payer: Medicare Other

## 2014-06-13 LAB — CULTURE, BLOOD (ROUTINE X 2)

## 2014-06-13 LAB — CBC
HCT: 36.5 % — ABNORMAL LOW (ref 39.0–52.0)
Hemoglobin: 12.2 g/dL — ABNORMAL LOW (ref 13.0–17.0)
MCH: 30 pg (ref 26.0–34.0)
MCHC: 33.4 g/dL (ref 30.0–36.0)
MCV: 89.7 fL (ref 78.0–100.0)
PLATELETS: 164 10*3/uL (ref 150–400)
RBC: 4.07 MIL/uL — AB (ref 4.22–5.81)
RDW: 13.3 % (ref 11.5–15.5)
WBC: 7.3 10*3/uL (ref 4.0–10.5)

## 2014-06-13 LAB — BASIC METABOLIC PANEL
ANION GAP: 11 (ref 5–15)
BUN: 12 mg/dL (ref 6–23)
CALCIUM: 9 mg/dL (ref 8.4–10.5)
CO2: 28 meq/L (ref 19–32)
CREATININE: 0.86 mg/dL (ref 0.50–1.35)
Chloride: 102 mEq/L (ref 96–112)
GFR calc Af Amer: >90 mL/min (ref >90)
GFR, EST NON AFRICAN AMERICAN: 86 mL/min — AB (ref >90)
Glucose, Bld: 120 mg/dL — ABNORMAL HIGH (ref 70–99)
Potassium: 3.6 mEq/L — ABNORMAL LOW (ref 3.7–5.3)
SODIUM: 141 meq/L (ref 137–147)

## 2014-06-13 LAB — GLUCOSE, CAPILLARY: GLUCOSE-CAPILLARY: 122 mg/dL — AB (ref 70–99)

## 2014-06-13 MED ORDER — POTASSIUM CHLORIDE CRYS ER 20 MEQ PO TBCR
40.0000 meq | EXTENDED_RELEASE_TABLET | Freq: Once | ORAL | Status: AC
  Administered 2014-06-13: 40 meq via ORAL
  Filled 2014-06-13: qty 2

## 2014-06-13 NOTE — Progress Notes (Signed)
ANTIBIOTIC CONSULT NOTE - INITIAL  Pharmacy Consult for cefazolin Indication: MSSA bacteremia  No Known Allergies  Patient Measurements: Height: 6\' 5"  (195.6 cm) Weight: 330 lb (149.687 kg) IBW/kg (Calculated) : 89.1   Vital Signs: Temp: 97.7 F (36.5 C) (08/29 0516) Temp src: Oral (08/29 0516) BP: 121/54 mmHg (08/29 0516) Pulse Rate: 54 (08/29 0516) Intake/Output from previous day: 08/28 0701 - 08/29 0700 In: 410 [P.O.:360; IV Piggyback:50] Out: 4200 [Urine:4200] Intake/Output from this shift: Total I/O In: 240 [P.O.:240] Out: -   Labs:  Recent Labs  06/11/14 0535 06/12/14 0548 06/13/14 0513  WBC 8.4 7.4 7.3  HGB 12.2* 12.6* 12.2*  PLT 144* 142* 164  CREATININE 1.03 0.94 0.86   Estimated Creatinine Clearance: 128.1 ml/min (by C-G formula based on Cr of 0.86). No results found for this basename: VANCOTROUGH, Corlis Leak, VANCORANDOM, [place], GENTPEAK, GENTRANDOM, TOBRATROUGH, TOBRAPEAK, TOBRARND, AMIKACINPEAK, AMIKACINTROU, AMIKACIN,  in the last 72 hours   Microbiology: Recent Results (from the past 720 hour(s))  CULTURE, BLOOD (ROUTINE X 2)     Status: None   Collection Time    06/10/14  6:45 AM      Result Value Ref Range Status   Specimen Description BLOOD FOREARM RIGHT   Final   Special Requests BOTTLES DRAWN AEROBIC AND ANAEROBIC 10CC   Final   Culture  Setup Time     Final   Value: 06/10/2014 11:50     Performed at [facility]   Culture     Final   Value: STAPHYLOCOCCUS AUREUS     Note: RIFAMPIN AND GENTAMICIN SHOULD NOT BE USED AS SINGLE DRUGS FOR TREATMENT OF STAPH INFECTIONS.     Note: Gram Stain Report Called to,Read Back By and Verified With: IMELDA SAULDIGO 06/11/14 0635A [place]     Performed at [facility]   Report Status 06/13/2014 FINAL   Final   Organism ID, Bacteria STAPHYLOCOCCUS AUREUS   Final  CULTURE, BLOOD (ROUTINE X 2)     Status: None   Collection Time    06/10/14  6:50 AM      Result Value Ref Range Status   Specimen Description BLOOD LEFT ANTECUBITAL   Final   Special Requests BOTTLES DRAWN AEROBIC AND ANAEROBIC 10CC   Final   Culture  Setup Time     Final   Value: 06/10/2014 11:50     Performed at [facility]   Culture     Final   Value: STAPHYLOCOCCUS AUREUS     Note: SUSCEPTIBILITIES PERFORMED ON PREVIOUS CULTURE WITHIN THE LAST 5 DAYS.     Note: Gram Stain Report Called to,Read Back By and Verified With: IMELDA SAULDIGO 06/11/14 0635A [facility]     Performed at [facility]   Report Status 06/13/2014 FINAL   Final  URINE CULTURE     Status: None   Collection Time    06/10/14  7:36 AM      Result Value Ref Range Status   Specimen Description URINE, RANDOM   Final   Special Requests NONE   Final   Culture  Setup Time     Final   Value: 06/10/2014 11:57     Performed at [place]     Final   Value: >=100,000 COLONIES/ML     Performed at [facility]   Culture     Final   Value: Multiple bacterial morphotypes present, none predominant. Suggest appropriate recollection if clinically indicated.  Performed at [facility]   Report Status 06/11/2014 FINAL   Final     Medications:  See mar  Assessment: 68 year old man with MSSA bacteremia to continue with cefazolin therapy.  Vancomycin has been stopped by ID team  Goal of Therapy:  Vancomycin trough level 15-20 mcg/ml  Plan:  Continue Cefazolin 2g IV q8h.  Candie Mile 06/13/2014,11:23 AM

## 2014-06-13 NOTE — Progress Notes (Addendum)
[facility]                                                                              Patient Demographics  Jerry [name], is a 68 y.o. male, DOB - 10/09/1946, QIH:[id number]  Admit date - 06/10/2014   Admitting Physician Cristal Ford, DO  Outpatient Primary MD for the patient is Cammy Copa, MD  LOS - 3   Chief Complaint  Patient presents with  . Back Pain  . Fall      HPI on 06/10/2014 Jerry [name] is a 68 y.o. male with a history of hypertension, prostate cancer status post suprapubic, that presented to the emergency department with complaints of generalized weakness. Patient stated he had been unable to move around since this past Sunday. Patient normally goes to the gym, approximately 4 times per week, and had to call his personal trainer to help him get up from the recliner. Patient was able to walk around his house with his walker however once in the bathroom patient stated his knees buckled. Patient then fell. He denied hitting his head. Patient also stated he has had poor appetite since this past Sunday. Patient complained of weakness however denied any numbness or tingling, dizziness, chest pain, shortness of breath, abdominal pain. Patient complained of back pain, as well as arthritis pain. Denied any recent travel or sick contacts.   Assessment & Plan  Sepsis secondary to urinary tract infection/ staph aureus bacteremia  -Patient did have leukocytosis and was febrile upon admission  -Leukocytosis resolved, continues to be febrile -Continue cefepime and IV fluids  -UA: Positive nitrites, large leukocytes,TNTC WBC, many bacteria  -Urine culture >100K colonies, mutliple bacterial morphotypes -Spoke to Dr. Matilde Sprang, urology, regarding changing suprapubic catheter, he recommended leaving the catheter in place and he should follow up with his urologist as an outpatient.  -Blood cultures  +staph aureus from8/26 -Blood cultures 8/28  pending -Echocardiogram: EF 55-60% -ID consulted and appreciated -Patient is apprehensive regarding TTE and PICC line placement. -Spoke with infectious disease, Dr. Tommy Medal, who recommended obtaining a lumbar MRI to rule out abscess, as well as discontinuing vancomycin. The patient's repeat blood cultures are negative for 2 days, patient may have PICC line placed.   Generalized weakness with recent fall  -Likely secondary to urinary tract infection  -CK 860 -TSH 2.5 -PT and OT consulted for evaluation and treatment and recommended SNF  Hyponatremia and hypochloremia  -Resolved, likely secondary to poor intake and dehydration vs lasix   Hypertension  -Will hold Lasix at this time due to patient's sepsis  -Blood pressure currently controlled   Diastolic heart failure  -Currently compensated  -Echocardiogram in January 2014 showed grade 2 diastolic dysfunction, EF of 50-55%  -Echocardiogram 06/12/2014: EF 87-56%, diastolic function parameters were normal -Continue to monitor intake and output as well as daily weights   History of prostate cancer with suprapubic cath  -Patient will need to follow up with his urologist as an outpatient   Arthritis  -Continue Mobic, will add on morphine PRN   Obesity  -Patient will need to continue his exercise program as well as diet modifications once discharged   OSA  -CPAP QHS  Code Status: Full  Family Communication: None at bedside  Disposition Plan: Admitted  Time Spent in minutes   25 minutes  Procedures  Echocardiogram Study Conclusions - Left ventricle: The cavity size was normal. Wall thickness was increased in a pattern of mild LVH. Systolic function was normal. The estimated ejection fraction was in the range of 55% to 60%. Regional wall motion abnormalities cannot be excluded. Left ventricular diastolic function parameters were normal. - Aortic valve: There was mild regurgitation. - Left atrium: The atrium was mildly  dilated. - Right ventricle: The cavity size was mildly dilated. - Right atrium: The atrium was moderately dilated. - Tricuspid valve: There was moderate regurgitation. - Pulmonary arteries: PA peak pressure: 32 mm Hg (S).  Consults   Urology, Dr. Matilde Sprang, via phone Infectious disease  DVT Prophylaxis  Lovenox  Lab Results  Component Value Date   PLT 164 06/13/2014    Medications  Scheduled Meds: . aspirin EC  325 mg Oral Daily  .  ceFAZolin (ANCEF) IV  2 g Intravenous 3 times per day  . enoxaparin (LOVENOX) injection  70 mg Subcutaneous Q24H  . feeding supplement (GLUCERNA SHAKE)  237 mL Oral BID BM  . meloxicam  7.5 mg Oral Daily  . vancomycin  1,500 mg Intravenous Q12H  . vitamin B-12  100 mcg Oral Daily   Continuous Infusions:   PRN Meds:.acetaminophen, acetaminophen, docusate sodium, morphine injection, ondansetron (ZOFRAN) IV, ondansetron, zolpidem  Antibiotics    Anti-infectives   Start     Dose/Rate Route Frequency Ordered Stop   06/12/14 1130  ceFAZolin (ANCEF) IVPB 2 g/50 mL premix     2 g 100 mL/hr over 30 Minutes Intravenous 3 times per day 06/12/14 1035     06/11/14 2000  vancomycin (VANCOCIN) 1,500 mg in sodium chloride 0.9 % 500 mL IVPB     1,500 mg 250 mL/hr over 120 Minutes Intravenous Every 12 hours 06/11/14 0709     06/11/14 0800  vancomycin (VANCOCIN) 2,500 mg in sodium chloride 0.9 % 500 mL IVPB     2,500 mg 250 mL/hr over 120 Minutes Intravenous  Once 06/11/14 0708 06/11/14 1055   06/10/14 0745  ceFEPIme (MAXIPIME) 1 g in dextrose 5 % 50 mL IVPB  Status:  Discontinued     1 g 100 mL/hr over 30 Minutes Intravenous 3 times per day 06/10/14 0734 06/12/14 7846        Subjective:   Jerry [name] seen and examined today.  Patients states he is feeling much better. Denies chest pain, SOB, abdominal pain, headache or dizziness.    Objective:   Filed Vitals:   06/12/14 0536 06/12/14 1355 06/12/14 2106 06/13/14 0516  BP: 110/51 127/64 126/56  121/54  Pulse: 61 56 54 54  Temp: 97.9 F (36.6 C) 97.8 F (36.6 C) 97.5 F (36.4 C) 97.7 F (36.5 C)  TempSrc: Oral Oral Oral Oral  Resp: 18 19 17 18   Height:      Weight: 149.687 kg (330 lb)     SpO2: 92% 95% 92% 90%    Wt Readings from Last 3 Encounters:  06/12/14 149.687 kg (330 lb)  10/26/12 164 kg (361 lb 8.9 oz)     Intake/Output Summary (Last 24 hours) at 06/13/14 1053 Last data filed at 06/13/14 0909  Gross per 24 hour  Intake    650 ml  Output   4200 ml  Net  -3550 ml    Exam General: Well developed, well nourished, NAD, appears stated  age  89: NCAT, mucous membranes moist.  Cardiovascular: S1 S2 auscultated, no rubs, murmurs or gallops. Regular rate and rhythm.  Respiratory: Clear to auscultation bilaterally with equal chest rise  Abdomen: Soft, obese, nontender, nondistended, + bowel sounds, suprapubic cath in place  Extremities: warm dry without cyanosis clubbing or edema Neuro: AAOx3, no focal deficits  Psych: Normal affect and demeanor   Data Review   Micro Results Recent Results (from the past 240 hour(s))  CULTURE, BLOOD (ROUTINE X 2)     Status: None   Collection Time    06/10/14  6:45 AM      Result Value Ref Range Status   Specimen Description BLOOD FOREARM RIGHT   Final   Special Requests BOTTLES DRAWN AEROBIC AND ANAEROBIC 10CC   Final   Culture  Setup Time     Final   Value: 06/10/2014 11:50     Performed at [facility]   Culture     Final   Value: STAPHYLOCOCCUS AUREUS     Note: RIFAMPIN AND GENTAMICIN SHOULD NOT BE USED AS SINGLE DRUGS FOR TREATMENT OF STAPH INFECTIONS.     Note: Gram Stain Report Called to,Read Back By and Verified With: IMELDA SAULDIGO 06/11/14 0635A [place]     Performed at [facility]   Report Status 06/13/2014 FINAL   Final   Organism ID, Bacteria STAPHYLOCOCCUS AUREUS   Final  CULTURE, BLOOD (ROUTINE X 2)     Status: None   Collection Time    06/10/14  6:50 AM      Result Value Ref Range  Status   Specimen Description BLOOD LEFT ANTECUBITAL   Final   Special Requests BOTTLES DRAWN AEROBIC AND ANAEROBIC 10CC   Final   Culture  Setup Time     Final   Value: 06/10/2014 11:50     Performed at [facility]   Culture     Final   Value: STAPHYLOCOCCUS AUREUS     Note: SUSCEPTIBILITIES PERFORMED ON PREVIOUS CULTURE WITHIN THE LAST 5 DAYS.     Note: Gram Stain Report Called to,Read Back By and Verified With: IMELDA SAULDIGO 06/11/14 0635A [facility]     Performed at [facility]   Report Status 06/13/2014 FINAL   Final  URINE CULTURE     Status: None   Collection Time    06/10/14  7:36 AM      Result Value Ref Range Status   Specimen Description URINE, RANDOM   Final   Special Requests NONE   Final   Culture  Setup Time     Final   Value: 06/10/2014 11:57     Performed at [place]     Final   Value: >=100,000 COLONIES/ML     Performed at [facility]   Culture     Final   Value: Multiple bacterial morphotypes present, none predominant. Suggest appropriate recollection if clinically indicated.     Performed at [facility]   Report Status 06/11/2014 FINAL   Final    Radiology Reports Dg Chest 2 View  06/10/2014   CLINICAL DATA:  Injury, fall, back pain, cough, fever  EXAM: CHEST  2 VIEW  COMPARISON:  10/25/2012  FINDINGS: Chronic elevation RIGHT diaphragm.  Normal heart size, mediastinal contours and pulmonary vascularity.  Chronic bronchitic changes and RIGHT basilar atelectasis versus scarring.  Remaining lungs clear.  No pleural effusion or pneumothorax.  Scattered endplate spur formation thoracic spine.  IMPRESSION: Chronic elevation of RIGHT diaphragm with RIGHT basilar atelectasis versus scarring.  Chronic bronchitic changes without pulmonary infiltrate.   Electronically Signed   By: Lavonia Dana M.D.   On: 06/10/2014 07:48   Dg Lumbar Spine Complete  06/10/2014   CLINICAL DATA:  Fall.  EXAM: LUMBAR SPINE - COMPLETE 4+  VIEW  COMPARISON:  06/04/2007.  FINDINGS: Bilateral nephrolithiasis with particularly prominent staghorn calculus left kidney. Aortoiliac atherosclerotic vascular disease. Diffuse degenerative changes lumbar spine and both hips. Minimal 6 mm anterolisthesis L4 on L5. This is most likely degenerative. No evidence of fracture. Normal bony mineralization.  IMPRESSION: 1. Diffuse degenerative changes lumbar spine with minimal 6 mm anterolisthesis L4 on L5. This is most likely degenerative. No acute bony abnormality. 2. Bilateral nephrolithiasis with 50 prominent staghorn calculus left kidney. 3. Aortoiliac atherosclerotic vascular disease.   Electronically Signed   By: Marcello Moores  Register   On: 06/10/2014 07:51   Ct Head Wo Contrast  06/10/2014   CLINICAL DATA:  Fall with intractable pain. Diffuse headache. Posterior neck tenderness.  EXAM: CT HEAD WITHOUT CONTRAST  CT CERVICAL SPINE WITHOUT CONTRAST  TECHNIQUE: Multidetector CT imaging of the head and cervical spine was performed following the standard protocol without intravenous contrast. Multiplanar CT image reconstructions of the cervical spine were also generated.  COMPARISON:  CT head 10/25/2012 and MR cervical spine 05/11/2007.  FINDINGS: CT HEAD FINDINGS  No evidence of an acute infarct, acute hemorrhage, mass lesion, mass effect or hydrocephalus. Atrophy. Periventricular low attenuation. There may be a tiny remote infarct in the left cerebellar hemisphere (image 8). Visualized portions of the paranasal sinuses and mastoid air cells are clear. No fracture.  CT CERVICAL SPINE FINDINGS  Alignment is anatomic. Patient is status post C3-6 anterior cervical fusion. No subluxation or fracture. Endplate degenerative changes and uncovertebral hypertrophy are seen at C2-3, C6-7 and C7-T1. Loss of disc space height at C6-7 and C7-T1. Multilevel facet hypertrophy.  At C2-3, severe right neural foraminal narrowing.  At C3-4, moderate right neural foraminal narrowing.  At  C4-5, moderate to severe bilateral neural foraminal narrowing.  At C5-6, moderate to severe bilateral neural foraminal narrowing.  At C6-7, severe bilateral neural foraminal narrowing.  Visualized lung apices are unremarkable. Soft tissues are unremarkable.  IMPRESSION: 1. No acute intracranial abnormality. 2. No acute fracture or subluxation in the cervical spine. 3. Atrophy and chronic microvascular white matter ischemic changes. 4. C3-6 anterior cervical fusion. 5. Multilevel spondylosis.   Electronically Signed   By: Lorin Picket M.D.   On: 06/10/2014 07:38   Ct Cervical Spine Wo Contrast  06/10/2014   CLINICAL DATA:  Fall with intractable pain. Diffuse headache. Posterior neck tenderness.  EXAM: CT HEAD WITHOUT CONTRAST  CT CERVICAL SPINE WITHOUT CONTRAST  TECHNIQUE: Multidetector CT imaging of the head and cervical spine was performed following the standard protocol without intravenous contrast. Multiplanar CT image reconstructions of the cervical spine were also generated.  COMPARISON:  CT head 10/25/2012 and MR cervical spine 05/11/2007.  FINDINGS: CT HEAD FINDINGS  No evidence of an acute infarct, acute hemorrhage, mass lesion, mass effect or hydrocephalus. Atrophy. Periventricular low attenuation. There may be a tiny remote infarct in the left cerebellar hemisphere (image 8). Visualized portions of the paranasal sinuses and mastoid air cells are clear. No fracture.  CT CERVICAL SPINE FINDINGS  Alignment is anatomic. Patient is status post C3-6 anterior cervical fusion. No subluxation or fracture. Endplate degenerative changes and uncovertebral hypertrophy are seen at C2-3, C6-7 and  C7-T1. Loss of disc space height at C6-7 and C7-T1. Multilevel facet hypertrophy.  At C2-3, severe right neural foraminal narrowing.  At C3-4, moderate right neural foraminal narrowing.  At C4-5, moderate to severe bilateral neural foraminal narrowing.  At C5-6, moderate to severe bilateral neural foraminal narrowing.  At  C6-7, severe bilateral neural foraminal narrowing.  Visualized lung apices are unremarkable. Soft tissues are unremarkable.  IMPRESSION: 1. No acute intracranial abnormality. 2. No acute fracture or subluxation in the cervical spine. 3. Atrophy and chronic microvascular white matter ischemic changes. 4. C3-6 anterior cervical fusion. 5. Multilevel spondylosis.   Electronically Signed   By: Lorin Picket M.D.   On: 06/10/2014 07:38    CBC  Recent Labs Lab 06/10/14 0605 06/10/14 1525 06/11/14 0535 06/12/14 0548 06/13/14 0513  WBC 13.5* 11.2* 8.4 7.4 7.3  HGB 14.1 13.6 12.2* 12.6* 12.2*  HCT 41.5 40.3 37.1* 38.2* 36.5*  PLT 164 172 144* 142* 164  MCV 90.0 91.6 92.1 92.5 89.7  MCH 30.6 30.9 30.3 30.5 30.0  MCHC 34.0 33.7 32.9 33.0 33.4  RDW 13.6 13.5 13.7 13.6 13.3  LYMPHSABS 0.6*  --   --   --   --   MONOABS 1.4*  --   --   --   --   EOSABS 0.0  --   --   --   --   BASOSABS 0.0  --   --   --   --     Chemistries   Recent Labs Lab 06/10/14 0605 06/10/14 1525 06/11/14 0535 06/12/14 0548 06/13/14 0513  NA 132*  --  136* 141 141  K 4.1  --  3.9 4.0 3.6*  CL 92*  --  99 105 102  CO2 24  --  24 27 28   GLUCOSE 138*  --  117* 119* 120*  BUN 22  --  15 13 12   CREATININE 1.37* 1.19 1.03 0.94 0.86  CALCIUM 8.9  --  8.4 8.5 9.0  AST 33  --   --   --   --   ALT 19  --   --   --   --   ALKPHOS 63  --   --   --   --   BILITOT 1.0  --   --   --   --    ------------------------------------------------------------------------------------------------------------------ estimated creatinine clearance is 128.1 ml/min (by C-G formula based on Cr of 0.86). ------------------------------------------------------------------------------------------------------------------ No results found for this basename: HGBA1C,  in the last 72 hours ------------------------------------------------------------------------------------------------------------------ No results found for this basename: CHOL,  HDL, LDLCALC, TRIG, CHOLHDL, LDLDIRECT,  in the last 72 hours ------------------------------------------------------------------------------------------------------------------  Recent Labs  06/10/14 1525  TSH 2.500   ------------------------------------------------------------------------------------------------------------------ No results found for this basename: VITAMINB12, FOLATE, FERRITIN, TIBC, IRON, RETICCTPCT,  in the last 72 hours  Coagulation profile No results found for this basename: INR, PROTIME,  in the last 168 hours  No results found for this basename: DDIMER,  in the last 72 hours  Cardiac Enzymes No results found for this basename: CK, CKMB, TROPONINI, MYOGLOBIN,  in the last 168 hours ------------------------------------------------------------------------------------------------------------------ No components found with this basename: POCBNP,     Kaleiah Kutzer D.O. on 06/13/2014 at 10:53 AM  Between 7am to 7pm - Pager - [phone]  After 7pm go to [email or web address] - password [email or web address]  And look for the night coverage person covering for me after hours  [facility] Office  [phone]

## 2014-06-14 LAB — BASIC METABOLIC PANEL
Anion gap: 8 (ref 5–15)
BUN: 12 mg/dL (ref 6–23)
CALCIUM: 9.4 mg/dL (ref 8.4–10.5)
CO2: 31 mEq/L (ref 19–32)
Chloride: 99 mEq/L (ref 96–112)
Creatinine, Ser: 1 mg/dL (ref 0.50–1.35)
GFR calc non Af Amer: 74 mL/min — ABNORMAL LOW (ref >90)
GFR, EST AFRICAN AMERICAN: 86 mL/min — AB (ref >90)
GLUCOSE: 119 mg/dL — AB (ref 70–99)
POTASSIUM: 4.2 meq/L (ref 3.7–5.3)
Sodium: 138 mEq/L (ref 137–147)

## 2014-06-14 LAB — CBC
HEMATOCRIT: 39.1 % (ref 39.0–52.0)
HEMOGLOBIN: 12.8 g/dL — AB (ref 13.0–17.0)
MCH: 30.3 pg (ref 26.0–34.0)
MCHC: 32.7 g/dL (ref 30.0–36.0)
MCV: 92.4 fL (ref 78.0–100.0)
Platelets: 185 10*3/uL (ref 150–400)
RBC: 4.23 MIL/uL (ref 4.22–5.81)
RDW: 13.2 % (ref 11.5–15.5)
WBC: 8 10*3/uL (ref 4.0–10.5)

## 2014-06-14 MED ORDER — FUROSEMIDE 20 MG PO TABS
20.0000 mg | ORAL_TABLET | Freq: Every day | ORAL | Status: DC
  Administered 2014-06-14 – 2014-06-16 (×3): 20 mg via ORAL
  Filled 2014-06-14 (×3): qty 1

## 2014-06-14 NOTE — Progress Notes (Signed)
Patient refused CPAP.

## 2014-06-14 NOTE — Progress Notes (Signed)
Clinical Social Work Department BRIEF PSYCHOSOCIAL ASSESSMENT 06/14/2014  Patient:  Jerry [name], Jerry [name]     Account Number:  [id number]     Admit date:  06/10/2014  Clinical Social Worker:  Rolinda Roan  Date/Time:  06/14/2014 06:12 PM  Referred by:  Physician  Date Referred:  06/12/2014 Referred for  SNF Placement   Other Referral:   Interview type:  Patient Other interview type:    PSYCHOSOCIAL DATA Living Status:  ALONE Admitted from facility:   Level of care:   Primary support name:  Aura Dials Primary support relationship to patient:  FRIEND Degree of support available:   Good support per patient.    CURRENT CONCERNS  Other Concerns:    SOCIAL WORK ASSESSMENT / PLAN Clinical Social Worker (CSW) met with patient to discuss D/C plan. Patient reported that he lives alone in [place]. 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 Per patient he goes to the gym to work out on a daily basis. Patient was in the [employer] and serves in [employer]. Patient reported that he has good friends and neighbors that are his social support. Patient is agreeable to SNF search. Patient reported that he would like to go to [facility] inpatient rehab because he has been there twice before. Patient is also interested in CIR. Patient reported that he would rather go home if he can't get into [facility] or CIR.   Assessment/plan status:  Psychosocial Support/Ongoing Assessment of Needs Other assessment/ plan:   Information/referral to community resources:   CSW gave patient SNF list.    PATIENT'S/FAMILY'S RESPONSE TO PLAN OF CARE: Patient thanked CSW for visit and assisting with placement process.

## 2014-06-14 NOTE — Progress Notes (Signed)
[facility]                                                                              Patient Demographics  Jerry [name], is a 68 y.o. male, DOB - 01/05/1946, EPP:[id number]  Admit date - 06/10/2014   Admitting Physician Cristal Ford, DO  Outpatient Primary MD for the patient is Cammy Copa, MD  LOS - 4   Chief Complaint  Patient presents with  . Back Pain  . Fall      HPI on 06/10/2014 Jerry [name] is a 68 y.o. male with a history of hypertension, prostate cancer status post suprapubic, that presented to the emergency department with complaints of generalized weakness. Patient stated he had been unable to move around since this past Sunday. Patient normally goes to the gym, approximately 4 times per week, and had to call his personal trainer to help him get up from the recliner. Patient was able to walk around his house with his walker however once in the bathroom patient stated his knees buckled. Patient then fell. He denied hitting his head. Patient also stated he has had poor appetite since this past Sunday. Patient complained of weakness however denied any numbness or tingling, dizziness, chest pain, shortness of breath, abdominal pain. Patient complained of back pain, as well as arthritis pain. Denied any recent travel or sick contacts.   Assessment & Plan  Sepsis secondary to urinary tract infection/ staph aureus bacteremia  -Patient did have leukocytosis and was febrile upon admission, however resolved.  -Continue cefepime and IV fluids  -UA: Positive nitrites, large leukocytes,TNTC WBC, many bacteria  -Urine culture >100K colonies, mutliple bacterial morphotypes -Spoke to Dr. Matilde Sprang, urology, regarding changing suprapubic catheter, he recommended leaving the catheter in place and he should follow up with his urologist as an outpatient.  -Blood cultures  +staph aureus from8/26 -Blood cultures 8/28 show no growth to date -Echocardiogram: EF  55-60% -ID consulted and appreciated -Patient is apprehensive regarding TTE and PICC line placement. -Spoke with infectious disease, Dr. Tommy Medal, who recommended obtaining a lumbar MRI to rule out abscess, as well as discontinuing vancomycin. Recommends 4-6 weeks of IV antibiotics. -MRI lumbar spine: No evidence of discitis/osteomyelitis or epidural abscess -Will order PICC line to be placed 06/15/2014  Generalized weakness with recent fall  -Likely secondary to urinary tract infection  -CK 860 -TSH 2.5 -PT and OT consulted for evaluation and treatment and recommended SNF  Hyponatremia and hypochloremia  -Resolved, likely secondary to poor intake and dehydration vs lasix   Hypertension  -Blood pressure currently controlled   Diastolic heart failure  -Currently compensated  -Echocardiogram in January 2014 showed grade 2 diastolic dysfunction, EF of 50-55%  -Echocardiogram 06/12/2014: EF 60-63%, diastolic function parameters were normal -Continue to monitor intake and output as well as daily weights  -Will restart Lasix today  History of prostate cancer with suprapubic cath  -Patient will need to follow up with his urologist as an outpatient   Arthritis  -Continue Mobic and morphine PRN   Obesity  -Patient will need to continue his exercise program as well as diet modifications once discharged   OSA  -CPAP QHS   Code Status:  Full  Family Communication: None at bedside  Disposition Plan: Admitted  Time Spent in minutes   25 minutes  Procedures  Echocardiogram Study Conclusions - Left ventricle: The cavity size was normal. Wall thickness was increased in a pattern of mild LVH. Systolic function was normal. The estimated ejection fraction was in the range of 55% to 60%. Regional wall motion abnormalities cannot be excluded. Left ventricular diastolic function parameters were normal. - Aortic valve: There was mild regurgitation. - Left atrium: The atrium was mildly  dilated. - Right ventricle: The cavity size was mildly dilated. - Right atrium: The atrium was moderately dilated. - Tricuspid valve: There was moderate regurgitation. - Pulmonary arteries: PA peak pressure: 32 mm Hg (S).  Consults   Urology, Dr. Matilde Sprang, via phone Infectious disease  DVT Prophylaxis  Lovenox  Lab Results  Component Value Date   PLT 185 06/14/2014    Medications  Scheduled Meds: . aspirin EC  325 mg Oral Daily  .  ceFAZolin (ANCEF) IV  2 g Intravenous 3 times per day  . enoxaparin (LOVENOX) injection  70 mg Subcutaneous Q24H  . feeding supplement (GLUCERNA SHAKE)  237 mL Oral BID BM  . meloxicam  7.5 mg Oral Daily  . vitamin B-12  100 mcg Oral Daily   Continuous Infusions:   PRN Meds:.acetaminophen, acetaminophen, docusate sodium, morphine injection, ondansetron (ZOFRAN) IV, ondansetron, zolpidem  Antibiotics    Anti-infectives   Start     Dose/Rate Route Frequency Ordered Stop   06/12/14 1130  ceFAZolin (ANCEF) IVPB 2 g/50 mL premix     2 g 100 mL/hr over 30 Minutes Intravenous 3 times per day 06/12/14 1035     06/11/14 2000  vancomycin (VANCOCIN) 1,500 mg in sodium chloride 0.9 % 500 mL IVPB  Status:  Discontinued     1,500 mg 250 mL/hr over 120 Minutes Intravenous Every 12 hours 06/11/14 0709 06/13/14 1058   06/11/14 0800  vancomycin (VANCOCIN) 2,500 mg in sodium chloride 0.9 % 500 mL IVPB     2,500 mg 250 mL/hr over 120 Minutes Intravenous  Once 06/11/14 0708 06/11/14 1055   06/10/14 0745  ceFEPIme (MAXIPIME) 1 g in dextrose 5 % 50 mL IVPB  Status:  Discontinued     1 g 100 mL/hr over 30 Minutes Intravenous 3 times per day 06/10/14 0734 06/12/14 2426        Subjective:   Jerry [name] seen and examined today.  Patients states he is feeling weak.  He denies chest pain, SOB, cough, abdominal pain, headache or dizziness.  He is apprehensive regarding PICC line placement and TEE.  Objective:   Filed Vitals:   06/13/14 0516 06/13/14 1434  06/13/14 2126 06/14/14 0520  BP: 121/54 124/54 113/53 117/57  Pulse: 54 57 66 57  Temp: 97.7 F (36.5 C) 98.1 F (36.7 C) 98.2 F (36.8 C) 97.5 F (36.4 C)  TempSrc: Oral Oral Oral   Resp: 18 16 17 16   Height:      Weight:    149.596 kg (329 lb 12.8 oz)  SpO2: 90% 94% 94% 92%    Wt Readings from Last 3 Encounters:  06/14/14 149.596 kg (329 lb 12.8 oz)  10/26/12 164 kg (361 lb 8.9 oz)     Intake/Output Summary (Last 24 hours) at 06/14/14 1108 Last data filed at 06/14/14 0951  Gross per 24 hour  Intake    840 ml  Output   3125 ml  Net  -2285 ml    Exam  General: Well developed, well nourished, NAD, appears stated age  HEENT: NCAT, mucous membranes moist.  Cardiovascular: S1 S2 auscultated, Regular rate and rhythm.  Respiratory: Clear to auscultation bilaterally with equal chest rise  Abdomen: Soft, obese, nontender, nondistended, + bowel sounds, suprapubic cath in place  Extremities: warm dry without cyanosis clubbing or edema Neuro: AAOx3, no focal deficits  Psych: Normal affect and demeanor   Data Review   Micro Results Recent Results (from the past 240 hour(s))  CULTURE, BLOOD (ROUTINE X 2)     Status: None   Collection Time    06/10/14  6:45 AM      Result Value Ref Range Status   Specimen Description BLOOD FOREARM RIGHT   Final   Special Requests BOTTLES DRAWN AEROBIC AND ANAEROBIC 10CC   Final   Culture  Setup Time     Final   Value: 06/10/2014 11:50     Performed at [facility]   Culture     Final   Value: STAPHYLOCOCCUS AUREUS     Note: RIFAMPIN AND GENTAMICIN SHOULD NOT BE USED AS SINGLE DRUGS FOR TREATMENT OF STAPH INFECTIONS.     Note: Gram Stain Report Called to,Read Back By and Verified With: IMELDA SAULDIGO 06/11/14 0635A [place]     Performed at [facility]   Report Status 06/13/2014 FINAL   Final   Organism ID, Bacteria STAPHYLOCOCCUS AUREUS   Final  CULTURE, BLOOD (ROUTINE X 2)     Status: None   Collection Time    06/10/14   6:50 AM      Result Value Ref Range Status   Specimen Description BLOOD LEFT ANTECUBITAL   Final   Special Requests BOTTLES DRAWN AEROBIC AND ANAEROBIC 10CC   Final   Culture  Setup Time     Final   Value: 06/10/2014 11:50     Performed at [facility]   Culture     Final   Value: STAPHYLOCOCCUS AUREUS     Note: SUSCEPTIBILITIES PERFORMED ON PREVIOUS CULTURE WITHIN THE LAST 5 DAYS.     Note: Gram Stain Report Called to,Read Back By and Verified With: IMELDA SAULDIGO 06/11/14 0635A [facility]     Performed at [facility]   Report Status 06/13/2014 FINAL   Final  URINE CULTURE     Status: None   Collection Time    06/10/14  7:36 AM      Result Value Ref Range Status   Specimen Description URINE, RANDOM   Final   Special Requests NONE   Final   Culture  Setup Time     Final   Value: 06/10/2014 11:57     Performed at [place]     Final   Value: >=100,000 COLONIES/ML     Performed at [facility]   Culture     Final   Value: Multiple bacterial morphotypes present, none predominant. Suggest appropriate recollection if clinically indicated.     Performed at [facility]   Report Status 06/11/2014 FINAL   Final  CULTURE, BLOOD (ROUTINE X 2)     Status: None   Collection Time    06/12/14  1:15 PM      Result Value Ref Range Status   Specimen Description BLOOD LEFT HAND   Final   Special Requests BOTTLES DRAWN AEROBIC AND ANAEROBIC 5CC   Final   Culture  Setup Time     Final   Value: 06/12/2014 17:16  Performed at [facility]     Final   Value:        BLOOD CULTURE RECEIVED NO GROWTH TO DATE CULTURE WILL BE HELD FOR 5 DAYS BEFORE ISSUING A FINAL NEGATIVE REPORT     Performed at [facility]   Report Status PENDING   Incomplete  CULTURE, BLOOD (ROUTINE X 2)     Status: None   Collection Time    06/12/14  1:25 PM      Result Value Ref Range Status   Specimen Description BLOOD RIGHT ARM   Final    Special Requests BOTTLES DRAWN AEROBIC AND ANAEROBIC 5CC   Final   Culture  Setup Time     Final   Value: 06/12/2014 17:16     Performed at [facility]   Culture     Final   Value:        BLOOD CULTURE RECEIVED NO GROWTH TO DATE CULTURE WILL BE HELD FOR 5 DAYS BEFORE ISSUING A FINAL NEGATIVE REPORT     Performed at [facility]   Report Status PENDING   Incomplete    Radiology Reports Dg Chest 2 View  06/10/2014   CLINICAL DATA:  Injury, fall, back pain, cough, fever  EXAM: CHEST  2 VIEW  COMPARISON:  10/25/2012  FINDINGS: Chronic elevation RIGHT diaphragm.  Normal heart size, mediastinal contours and pulmonary vascularity.  Chronic bronchitic changes and RIGHT basilar atelectasis versus scarring.  Remaining lungs clear.  No pleural effusion or pneumothorax.  Scattered endplate spur formation thoracic spine.  IMPRESSION: Chronic elevation of RIGHT diaphragm with RIGHT basilar atelectasis versus scarring.  Chronic bronchitic changes without pulmonary infiltrate.   Electronically Signed   By: Lavonia Dana M.D.   On: 06/10/2014 07:48   Dg Lumbar Spine Complete  06/10/2014   CLINICAL DATA:  Fall.  EXAM: LUMBAR SPINE - COMPLETE 4+ VIEW  COMPARISON:  06/04/2007.  FINDINGS: Bilateral nephrolithiasis with particularly prominent staghorn calculus left kidney. Aortoiliac atherosclerotic vascular disease. Diffuse degenerative changes lumbar spine and both hips. Minimal 6 mm anterolisthesis L4 on L5. This is most likely degenerative. No evidence of fracture. Normal bony mineralization.  IMPRESSION: 1. Diffuse degenerative changes lumbar spine with minimal 6 mm anterolisthesis L4 on L5. This is most likely degenerative. No acute bony abnormality. 2. Bilateral nephrolithiasis with 50 prominent staghorn calculus left kidney. 3. Aortoiliac atherosclerotic vascular disease.   Electronically Signed   By: Marcello Moores  Register   On: 06/10/2014 07:51   Ct Head Wo Contrast  06/10/2014   CLINICAL DATA:  Fall  with intractable pain. Diffuse headache. Posterior neck tenderness.  EXAM: CT HEAD WITHOUT CONTRAST  CT CERVICAL SPINE WITHOUT CONTRAST  TECHNIQUE: Multidetector CT imaging of the head and cervical spine was performed following the standard protocol without intravenous contrast. Multiplanar CT image reconstructions of the cervical spine were also generated.  COMPARISON:  CT head 10/25/2012 and MR cervical spine 05/11/2007.  FINDINGS: CT HEAD FINDINGS  No evidence of an acute infarct, acute hemorrhage, mass lesion, mass effect or hydrocephalus. Atrophy. Periventricular low attenuation. There may be a tiny remote infarct in the left cerebellar hemisphere (image 8). Visualized portions of the paranasal sinuses and mastoid air cells are clear. No fracture.  CT CERVICAL SPINE FINDINGS  Alignment is anatomic. Patient is status post C3-6 anterior cervical fusion. No subluxation or fracture. Endplate degenerative changes and uncovertebral hypertrophy are seen at C2-3, C6-7 and C7-T1. Loss of disc space height at C6-7  and C7-T1. Multilevel facet hypertrophy.  At C2-3, severe right neural foraminal narrowing.  At C3-4, moderate right neural foraminal narrowing.  At C4-5, moderate to severe bilateral neural foraminal narrowing.  At C5-6, moderate to severe bilateral neural foraminal narrowing.  At C6-7, severe bilateral neural foraminal narrowing.  Visualized lung apices are unremarkable. Soft tissues are unremarkable.  IMPRESSION: 1. No acute intracranial abnormality. 2. No acute fracture or subluxation in the cervical spine. 3. Atrophy and chronic microvascular white matter ischemic changes. 4. C3-6 anterior cervical fusion. 5. Multilevel spondylosis.   Electronically Signed   By: Lorin Picket M.D.   On: 06/10/2014 07:38   Ct Cervical Spine Wo Contrast  06/10/2014   CLINICAL DATA:  Fall with intractable pain. Diffuse headache. Posterior neck tenderness.  EXAM: CT HEAD WITHOUT CONTRAST  CT CERVICAL SPINE WITHOUT  CONTRAST  TECHNIQUE: Multidetector CT imaging of the head and cervical spine was performed following the standard protocol without intravenous contrast. Multiplanar CT image reconstructions of the cervical spine were also generated.  COMPARISON:  CT head 10/25/2012 and MR cervical spine 05/11/2007.  FINDINGS: CT HEAD FINDINGS  No evidence of an acute infarct, acute hemorrhage, mass lesion, mass effect or hydrocephalus. Atrophy. Periventricular low attenuation. There may be a tiny remote infarct in the left cerebellar hemisphere (image 8). Visualized portions of the paranasal sinuses and mastoid air cells are clear. No fracture.  CT CERVICAL SPINE FINDINGS  Alignment is anatomic. Patient is status post C3-6 anterior cervical fusion. No subluxation or fracture. Endplate degenerative changes and uncovertebral hypertrophy are seen at C2-3, C6-7 and C7-T1. Loss of disc space height at C6-7 and C7-T1. Multilevel facet hypertrophy.  At C2-3, severe right neural foraminal narrowing.  At C3-4, moderate right neural foraminal narrowing.  At C4-5, moderate to severe bilateral neural foraminal narrowing.  At C5-6, moderate to severe bilateral neural foraminal narrowing.  At C6-7, severe bilateral neural foraminal narrowing.  Visualized lung apices are unremarkable. Soft tissues are unremarkable.  IMPRESSION: 1. No acute intracranial abnormality. 2. No acute fracture or subluxation in the cervical spine. 3. Atrophy and chronic microvascular white matter ischemic changes. 4. C3-6 anterior cervical fusion. 5. Multilevel spondylosis.   Electronically Signed   By: Lorin Picket M.D.   On: 06/10/2014 07:38    CBC  Recent Labs Lab 06/10/14 1950 06/10/14 1525 06/11/14 0535 06/12/14 0548 06/13/14 0513 06/14/14 0427  WBC 13.5* 11.2* 8.4 7.4 7.3 8.0  HGB 14.1 13.6 12.2* 12.6* 12.2* 12.8*  HCT 41.5 40.3 37.1* 38.2* 36.5* 39.1  PLT 164 172 144* 142* 164 185  MCV 90.0 91.6 92.1 92.5 89.7 92.4  MCH 30.6 30.9 30.3 30.5 30.0  30.3  MCHC 34.0 33.7 32.9 33.0 33.4 32.7  RDW 13.6 13.5 13.7 13.6 13.3 13.2  LYMPHSABS 0.6*  --   --   --   --   --   MONOABS 1.4*  --   --   --   --   --   EOSABS 0.0  --   --   --   --   --   BASOSABS 0.0  --   --   --   --   --     Chemistries   Recent Labs Lab 06/10/14 0605 06/10/14 1525 06/11/14 0535 06/12/14 0548 06/13/14 0513 06/14/14 0427  NA 132*  --  136* 141 141 138  K 4.1  --  3.9 4.0 3.6* 4.2  CL 92*  --  99 105 102 99  CO2 24  --  24 27 28 31   GLUCOSE 138*  --  117* 119* 120* 119*  BUN 22  --  15 13 12 12   CREATININE 1.37* 1.19 1.03 0.94 0.86 1.00  CALCIUM 8.9  --  8.4 8.5 9.0 9.4  AST 33  --   --   --   --   --   ALT 19  --   --   --   --   --   ALKPHOS 63  --   --   --   --   --   BILITOT 1.0  --   --   --   --   --    ------------------------------------------------------------------------------------------------------------------ estimated creatinine clearance is 110.2 ml/min (by C-G formula based on Cr of 1). ------------------------------------------------------------------------------------------------------------------ No results found for this basename: HGBA1C,  in the last 72 hours ------------------------------------------------------------------------------------------------------------------ No results found for this basename: CHOL, HDL, LDLCALC, TRIG, CHOLHDL, LDLDIRECT,  in the last 72 hours ------------------------------------------------------------------------------------------------------------------ No results found for this basename: TSH, T4TOTAL, FREET3, T3FREE, THYROIDAB,  in the last 72 hours ------------------------------------------------------------------------------------------------------------------ No results found for this basename: VITAMINB12, FOLATE, FERRITIN, TIBC, IRON, RETICCTPCT,  in the last 72 hours  Coagulation profile No results found for this basename: INR, PROTIME,  in the last 168 hours  No results found for this  basename: DDIMER,  in the last 72 hours  Cardiac Enzymes No results found for this basename: CK, CKMB, TROPONINI, MYOGLOBIN,  in the last 168 hours ------------------------------------------------------------------------------------------------------------------ No components found with this basename: POCBNP,     Carlee Vonderhaar D.O. on 06/14/2014 at 11:08 AM  Between 7am to 7pm - Pager - [phone]  After 7pm go to [email or web address] - password [email or web address]  And look for the night coverage person covering for me after hours  [facility] Office  [phone]

## 2014-06-14 NOTE — Progress Notes (Signed)
Clinical Social Work Department CLINICAL SOCIAL WORK PLACEMENT NOTE 06/14/2014  Patient:  RYLIN, SEAVEY  Account Number:  [id number] Admit date:  06/10/2014  Clinical Social Worker:  Blima Rich, Latanya Presser  Date/time:  06/14/2014 06:10 PM  Clinical Social Work is seeking post-discharge placement for this patient at the following level of care:   SKILLED NURSING   (*CSW will update this form in Epic as items are completed)   06/14/2014  Patient/family provided with [place] Department of Clinical Social Work's list of facilities offering this level of care within the geographic area requested by the patient (or if unable, by the patient's family).  06/14/2014  Patient/family informed of their freedom to choose among providers that offer the needed level of care, that participate in Medicare, Medicaid or managed care program needed by the patient, have an available bed and are willing to accept the patient.  06/14/2014  Patient/family informed of [facility]' ownership interest in [facility], as well as of the fact that they are under no obligation to receive care at this facility.  PASARR submitted to EDS on 06/04/2007 PASARR number received on 06/04/2007  FL2 transmitted to all facilities in geographic area requested by pt/family on  06/14/2014 FL2 transmitted to all facilities within larger geographic area on   Patient informed that his/her managed care company has contracts with or will negotiate with  certain facilities, including the following:     Patient/family informed of bed offers received:   Patient chooses bed at  Physician recommends and patient chooses bed at    Patient to be transferred to  on   Patient to be transferred to facility by  Patient and family notified of transfer on  Name of family member notified:    The following physician request were entered in Epic:   Additional Comments: Patient has an [place].

## 2014-06-15 DIAGNOSIS — A4901 Methicillin susceptible Staphylococcus aureus infection, unspecified site: Secondary | ICD-10-CM

## 2014-06-15 LAB — BASIC METABOLIC PANEL
Anion gap: 10 (ref 5–15)
BUN: 14 mg/dL (ref 6–23)
CALCIUM: 9 mg/dL (ref 8.4–10.5)
CO2: 31 mEq/L (ref 19–32)
CREATININE: 0.97 mg/dL (ref 0.50–1.35)
Chloride: 98 mEq/L (ref 96–112)
GFR calc Af Amer: >90 mL/min (ref >90)
GFR, EST NON AFRICAN AMERICAN: 82 mL/min — AB (ref >90)
Glucose, Bld: 104 mg/dL — ABNORMAL HIGH (ref 70–99)
Potassium: 4.2 mEq/L (ref 3.7–5.3)
SODIUM: 139 meq/L (ref 137–147)

## 2014-06-15 LAB — CBC
HCT: 39.9 % (ref 39.0–52.0)
Hemoglobin: 13.5 g/dL (ref 13.0–17.0)
MCH: 30.5 pg (ref 26.0–34.0)
MCHC: 33.8 g/dL (ref 30.0–36.0)
MCV: 90.3 fL (ref 78.0–100.0)
PLATELETS: 207 10*3/uL (ref 150–400)
RBC: 4.42 MIL/uL (ref 4.22–5.81)
RDW: 13.1 % (ref 11.5–15.5)
WBC: 8 10*3/uL (ref 4.0–10.5)

## 2014-06-15 MED ORDER — ENOXAPARIN SODIUM 60 MG/0.6ML ~~LOC~~ SOLN
0.5000 mg/kg | SUBCUTANEOUS | Status: DC
  Administered 2014-06-15 – 2014-06-16 (×2): 60 mg via SUBCUTANEOUS
  Filled 2014-06-15 (×2): qty 0.6

## 2014-06-15 NOTE — Progress Notes (Signed)
[facility]                                                                              Patient Demographics  Jerry [name], is a 68 y.o. male, DOB - 1946-06-04, HUD:[id number]  Admit date - 06/10/2014   Admitting Physician Cristal Ford, DO  Outpatient Primary MD for the patient is Cammy Copa, MD  LOS - 5   Chief Complaint  Patient presents with  . Back Pain  . Fall      HPI on 06/10/2014 Jerry [name] is a 68 y.o. male with a history of hypertension, prostate cancer status post suprapubic, that presented to the emergency department with complaints of generalized weakness. Patient stated he had been unable to move around since this past Sunday. Patient normally goes to the gym, approximately 4 times per week, and had to call his personal trainer to help him get up from the recliner. Patient was able to walk around his house with his walker however once in the bathroom patient stated his knees buckled. Patient then fell. He denied hitting his head. Patient also stated he has had poor appetite since this past Sunday. Patient complained of weakness however denied any numbness or tingling, dizziness, chest pain, shortness of breath, abdominal pain. Patient complained of back pain, as well as arthritis pain. Denied any recent travel or sick contacts.   Assessment & Plan  Sepsis secondary to urinary tract infection/ staph aureus bacteremia  -Patient did have leukocytosis and was febrile upon admission, however resolved.  -Continue cefepime and IV fluids  -UA: Positive nitrites, large leukocytes,TNTC WBC, many bacteria  -Urine culture >100K colonies, mutliple bacterial morphotypes -Spoke to Dr. Matilde Sprang, urology, regarding changing suprapubic catheter, he recommended leaving the catheter in place and he should follow up with his urologist as an outpatient.  -Blood cultures  +staph aureus from8/26 -Blood cultures 8/28 show no growth to date -Echocardiogram: EF  55-60% -ID consulted and appreciated -Patient is apprehensive regarding TTE and PICC line placement. -Spoke with infectious disease, Dr. Tommy Medal, who recommended obtaining a lumbar MRI to rule out abscess, as well as discontinuing vancomycin. Recommends 4-6 weeks of IV antibiotics. -MRI lumbar spine: No evidence of discitis/osteomyelitis or epidural abscess -PICC line ordered  Generalized weakness with recent fall  -Likely secondary to urinary tract infection  -CK 860 -TSH 2.5 -PT and OT consulted for evaluation and treatment and recommended SNF  Hyponatremia and hypochloremia  -Resolved, likely secondary to poor intake and dehydration vs lasix   Hypertension  -Blood pressure currently controlled   Diastolic heart failure  -Currently compensated  -Echocardiogram in January 2014 showed grade 2 diastolic dysfunction, EF of 50-55%  -Echocardiogram 06/12/2014: EF 85-88%, diastolic function parameters were normal -Continue to monitor intake and output as well as daily weights  -Continue lasix  History of prostate cancer with suprapubic cath  -Patient will need to follow up with his urologist as an outpatient   Arthritis  -Continue Mobic and morphine PRN   Obesity  -Patient will need to continue his exercise program as well as diet modifications once discharged   OSA  -CPAP QHS   Code Status: Full  Family Communication: None at bedside  Disposition Plan: Admitted  Time Spent in minutes   25 minutes  Procedures  Echocardiogram Study Conclusions - Left ventricle: The cavity size was normal. Wall thickness was increased in a pattern of mild LVH. Systolic function was normal. The estimated ejection fraction was in the range of 55% to 60%. Regional wall motion abnormalities cannot be excluded. Left ventricular diastolic function parameters were normal. - Aortic valve: There was mild regurgitation. - Left atrium: The atrium was mildly dilated. - Right ventricle: The cavity size  was mildly dilated. - Right atrium: The atrium was moderately dilated. - Tricuspid valve: There was moderate regurgitation. - Pulmonary arteries: PA peak pressure: 32 mm Hg (S).  Consults   Urology, Dr. Matilde Sprang, via phone Infectious disease  DVT Prophylaxis  Lovenox  Lab Results  Component Value Date   PLT 207 06/15/2014    Medications  Scheduled Meds: . aspirin EC  325 mg Oral Daily  .  ceFAZolin (ANCEF) IV  2 g Intravenous 3 times per day  . enoxaparin (LOVENOX) injection  70 mg Subcutaneous Q24H  . feeding supplement (GLUCERNA SHAKE)  237 mL Oral BID BM  . furosemide  20 mg Oral Daily  . meloxicam  7.5 mg Oral Daily  . vitamin B-12  100 mcg Oral Daily   Continuous Infusions:   PRN Meds:.acetaminophen, acetaminophen, docusate sodium, morphine injection, ondansetron (ZOFRAN) IV, ondansetron, zolpidem  Antibiotics    Anti-infectives   Start     Dose/Rate Route Frequency Ordered Stop   06/12/14 1130  ceFAZolin (ANCEF) IVPB 2 g/50 mL premix     2 g 100 mL/hr over 30 Minutes Intravenous 3 times per day 06/12/14 1035     06/11/14 2000  vancomycin (VANCOCIN) 1,500 mg in sodium chloride 0.9 % 500 mL IVPB  Status:  Discontinued     1,500 mg 250 mL/hr over 120 Minutes Intravenous Every 12 hours 06/11/14 0709 06/13/14 1058   06/11/14 0800  vancomycin (VANCOCIN) 2,500 mg in sodium chloride 0.9 % 500 mL IVPB     2,500 mg 250 mL/hr over 120 Minutes Intravenous  Once 06/11/14 0708 06/11/14 1055   06/10/14 0745  ceFEPIme (MAXIPIME) 1 g in dextrose 5 % 50 mL IVPB  Status:  Discontinued     1 g 100 mL/hr over 30 Minutes Intravenous 3 times per day 06/10/14 0734 06/12/14 5784        Subjective:   Jerry [name] seen and examined today.  Patient states he is feeling much better today. He is still hypertensive receiving a PICC line. Patient denies any headache, dizziness, shortness of breath, chest pain, abdominal pain.  Objective:   Filed Vitals:   06/14/14 0520 06/14/14  1304 06/14/14 2120 06/15/14 0517  BP: 117/57 113/59 115/58 119/62  Pulse: 57 66 58 53  Temp: 97.5 F (36.4 C) 97.9 F (36.6 C) 98 F (36.7 C) 97.4 F (36.3 C)  TempSrc:  Oral Oral Oral  Resp: 16 16 19 17   Height:      Weight: 149.596 kg (329 lb 12.8 oz)   118.842 kg (262 lb)  SpO2: 92% 95% 94% 93%    Wt Readings from Last 3 Encounters:  06/15/14 118.842 kg (262 lb)  10/26/12 164 kg (361 lb 8.9 oz)     Intake/Output Summary (Last 24 hours) at 06/15/14 1125 Last data filed at 06/15/14 0932  Gross per 24 hour  Intake    970 ml  Output   3200 ml  Net  -2230 ml  Exam General: Well developed, well nourished, NAD, appears stated age  HEENT: NCAT, mucous membranes moist.  Cardiovascular: S1 S2 auscultated, Regular rate and rhythm.  Respiratory: Clear to auscultation bilaterally with equal chest rise  Abdomen: Soft, obese, nontender, nondistended, + bowel sounds, suprapubic cath in place  Extremities: warm dry without cyanosis clubbing or edema Neuro: AAOx3, no focal deficits  Psych: Normal affect and demeanor   Data Review   Micro Results Recent Results (from the past 240 hour(s))  CULTURE, BLOOD (ROUTINE X 2)     Status: None   Collection Time    06/10/14  6:45 AM      Result Value Ref Range Status   Specimen Description BLOOD FOREARM RIGHT   Final   Special Requests BOTTLES DRAWN AEROBIC AND ANAEROBIC 10CC   Final   Culture  Setup Time     Final   Value: 06/10/2014 11:50     Performed at [facility]   Culture     Final   Value: STAPHYLOCOCCUS AUREUS     Note: RIFAMPIN AND GENTAMICIN SHOULD NOT BE USED AS SINGLE DRUGS FOR TREATMENT OF STAPH INFECTIONS.     Note: Gram Stain Report Called to,Read Back By and Verified With: IMELDA SAULDIGO 06/11/14 0635A [place]     Performed at [facility]   Report Status 06/13/2014 FINAL   Final   Organism ID, Bacteria STAPHYLOCOCCUS AUREUS   Final  CULTURE, BLOOD (ROUTINE X 2)     Status: None   Collection Time     06/10/14  6:50 AM      Result Value Ref Range Status   Specimen Description BLOOD LEFT ANTECUBITAL   Final   Special Requests BOTTLES DRAWN AEROBIC AND ANAEROBIC 10CC   Final   Culture  Setup Time     Final   Value: 06/10/2014 11:50     Performed at [facility]   Culture     Final   Value: STAPHYLOCOCCUS AUREUS     Note: SUSCEPTIBILITIES PERFORMED ON PREVIOUS CULTURE WITHIN THE LAST 5 DAYS.     Note: Gram Stain Report Called to,Read Back By and Verified With: IMELDA SAULDIGO 06/11/14 0635A [facility]     Performed at [facility]   Report Status 06/13/2014 FINAL   Final  URINE CULTURE     Status: None   Collection Time    06/10/14  7:36 AM      Result Value Ref Range Status   Specimen Description URINE, RANDOM   Final   Special Requests NONE   Final   Culture  Setup Time     Final   Value: 06/10/2014 11:57     Performed at [place]     Final   Value: >=100,000 COLONIES/ML     Performed at [facility]   Culture     Final   Value: Multiple bacterial morphotypes present, none predominant. Suggest appropriate recollection if clinically indicated.     Performed at [facility]   Report Status 06/11/2014 FINAL   Final  CULTURE, BLOOD (ROUTINE X 2)     Status: None   Collection Time    06/12/14  1:15 PM      Result Value Ref Range Status   Specimen Description BLOOD LEFT HAND   Final   Special Requests BOTTLES DRAWN AEROBIC AND ANAEROBIC 5CC   Final   Culture  Setup Time     Final   Value: 06/12/2014  17:16     Performed at [facility]     Final   Value:        BLOOD CULTURE RECEIVED NO GROWTH TO DATE CULTURE WILL BE HELD FOR 5 DAYS BEFORE ISSUING A FINAL NEGATIVE REPORT     Performed at [facility]   Report Status PENDING   Incomplete  CULTURE, BLOOD (ROUTINE X 2)     Status: None   Collection Time    06/12/14  1:25 PM      Result Value Ref Range Status   Specimen Description BLOOD RIGHT ARM    Final   Special Requests BOTTLES DRAWN AEROBIC AND ANAEROBIC 5CC   Final   Culture  Setup Time     Final   Value: 06/12/2014 17:16     Performed at [facility]   Culture     Final   Value:        BLOOD CULTURE RECEIVED NO GROWTH TO DATE CULTURE WILL BE HELD FOR 5 DAYS BEFORE ISSUING A FINAL NEGATIVE REPORT     Performed at [facility]   Report Status PENDING   Incomplete    Radiology Reports Dg Chest 2 View  06/10/2014   CLINICAL DATA:  Injury, fall, back pain, cough, fever  EXAM: CHEST  2 VIEW  COMPARISON:  10/25/2012  FINDINGS: Chronic elevation RIGHT diaphragm.  Normal heart size, mediastinal contours and pulmonary vascularity.  Chronic bronchitic changes and RIGHT basilar atelectasis versus scarring.  Remaining lungs clear.  No pleural effusion or pneumothorax.  Scattered endplate spur formation thoracic spine.  IMPRESSION: Chronic elevation of RIGHT diaphragm with RIGHT basilar atelectasis versus scarring.  Chronic bronchitic changes without pulmonary infiltrate.   Electronically Signed   By: Lavonia Dana M.D.   On: 06/10/2014 07:48   Dg Lumbar Spine Complete  06/10/2014   CLINICAL DATA:  Fall.  EXAM: LUMBAR SPINE - COMPLETE 4+ VIEW  COMPARISON:  06/04/2007.  FINDINGS: Bilateral nephrolithiasis with particularly prominent staghorn calculus left kidney. Aortoiliac atherosclerotic vascular disease. Diffuse degenerative changes lumbar spine and both hips. Minimal 6 mm anterolisthesis L4 on L5. This is most likely degenerative. No evidence of fracture. Normal bony mineralization.  IMPRESSION: 1. Diffuse degenerative changes lumbar spine with minimal 6 mm anterolisthesis L4 on L5. This is most likely degenerative. No acute bony abnormality. 2. Bilateral nephrolithiasis with 50 prominent staghorn calculus left kidney. 3. Aortoiliac atherosclerotic vascular disease.   Electronically Signed   By: Marcello Moores  Register   On: 06/10/2014 07:51   Ct Head Wo Contrast  06/10/2014   CLINICAL  DATA:  Fall with intractable pain. Diffuse headache. Posterior neck tenderness.  EXAM: CT HEAD WITHOUT CONTRAST  CT CERVICAL SPINE WITHOUT CONTRAST  TECHNIQUE: Multidetector CT imaging of the head and cervical spine was performed following the standard protocol without intravenous contrast. Multiplanar CT image reconstructions of the cervical spine were also generated.  COMPARISON:  CT head 10/25/2012 and MR cervical spine 05/11/2007.  FINDINGS: CT HEAD FINDINGS  No evidence of an acute infarct, acute hemorrhage, mass lesion, mass effect or hydrocephalus. Atrophy. Periventricular low attenuation. There may be a tiny remote infarct in the left cerebellar hemisphere (image 8). Visualized portions of the paranasal sinuses and mastoid air cells are clear. No fracture.  CT CERVICAL SPINE FINDINGS  Alignment is anatomic. Patient is status post C3-6 anterior cervical fusion. No subluxation or fracture. Endplate degenerative changes and uncovertebral hypertrophy are seen at C2-3, C6-7 and C7-T1. Loss of  disc space height at C6-7 and C7-T1. Multilevel facet hypertrophy.  At C2-3, severe right neural foraminal narrowing.  At C3-4, moderate right neural foraminal narrowing.  At C4-5, moderate to severe bilateral neural foraminal narrowing.  At C5-6, moderate to severe bilateral neural foraminal narrowing.  At C6-7, severe bilateral neural foraminal narrowing.  Visualized lung apices are unremarkable. Soft tissues are unremarkable.  IMPRESSION: 1. No acute intracranial abnormality. 2. No acute fracture or subluxation in the cervical spine. 3. Atrophy and chronic microvascular white matter ischemic changes. 4. C3-6 anterior cervical fusion. 5. Multilevel spondylosis.   Electronically Signed   By: Lorin Picket M.D.   On: 06/10/2014 07:38   Ct Cervical Spine Wo Contrast  06/10/2014   CLINICAL DATA:  Fall with intractable pain. Diffuse headache. Posterior neck tenderness.  EXAM: CT HEAD WITHOUT CONTRAST  CT CERVICAL SPINE  WITHOUT CONTRAST  TECHNIQUE: Multidetector CT imaging of the head and cervical spine was performed following the standard protocol without intravenous contrast. Multiplanar CT image reconstructions of the cervical spine were also generated.  COMPARISON:  CT head 10/25/2012 and MR cervical spine 05/11/2007.  FINDINGS: CT HEAD FINDINGS  No evidence of an acute infarct, acute hemorrhage, mass lesion, mass effect or hydrocephalus. Atrophy. Periventricular low attenuation. There may be a tiny remote infarct in the left cerebellar hemisphere (image 8). Visualized portions of the paranasal sinuses and mastoid air cells are clear. No fracture.  CT CERVICAL SPINE FINDINGS  Alignment is anatomic. Patient is status post C3-6 anterior cervical fusion. No subluxation or fracture. Endplate degenerative changes and uncovertebral hypertrophy are seen at C2-3, C6-7 and C7-T1. Loss of disc space height at C6-7 and C7-T1. Multilevel facet hypertrophy.  At C2-3, severe right neural foraminal narrowing.  At C3-4, moderate right neural foraminal narrowing.  At C4-5, moderate to severe bilateral neural foraminal narrowing.  At C5-6, moderate to severe bilateral neural foraminal narrowing.  At C6-7, severe bilateral neural foraminal narrowing.  Visualized lung apices are unremarkable. Soft tissues are unremarkable.  IMPRESSION: 1. No acute intracranial abnormality. 2. No acute fracture or subluxation in the cervical spine. 3. Atrophy and chronic microvascular white matter ischemic changes. 4. C3-6 anterior cervical fusion. 5. Multilevel spondylosis.   Electronically Signed   By: Lorin Picket M.D.   On: 06/10/2014 07:38    CBC  Recent Labs Lab 06/10/14 2426  06/11/14 0535 06/12/14 0548 06/13/14 0513 06/14/14 0427 06/15/14 0731  WBC 13.5*  < > 8.4 7.4 7.3 8.0 8.0  HGB 14.1  < > 12.2* 12.6* 12.2* 12.8* 13.5  HCT 41.5  < > 37.1* 38.2* 36.5* 39.1 39.9  PLT 164  < > 144* 142* 164 185 207  MCV 90.0  < > 92.1 92.5 89.7 92.4  90.3  MCH 30.6  < > 30.3 30.5 30.0 30.3 30.5  MCHC 34.0  < > 32.9 33.0 33.4 32.7 33.8  RDW 13.6  < > 13.7 13.6 13.3 13.2 13.1  LYMPHSABS 0.6*  --   --   --   --   --   --   MONOABS 1.4*  --   --   --   --   --   --   EOSABS 0.0  --   --   --   --   --   --   BASOSABS 0.0  --   --   --   --   --   --   < > = values in this interval not displayed.  Chemistries  Recent Labs Lab 06/10/14 0605  06/11/14 0535 06/12/14 0548 06/13/14 0513 06/14/14 0427 06/15/14 0731  NA 132*  --  136* 141 141 138 139  K 4.1  --  3.9 4.0 3.6* 4.2 4.2  CL 92*  --  99 105 102 99 98  CO2 24  --  24 27 28 31 31   GLUCOSE 138*  --  117* 119* 120* 119* 104*  BUN 22  --  15 13 12 12 14   CREATININE 1.37*  < > 1.03 0.94 0.86 1.00 0.97  CALCIUM 8.9  --  8.4 8.5 9.0 9.4 9.0  AST 33  --   --   --   --   --   --   ALT 19  --   --   --   --   --   --   ALKPHOS 63  --   --   --   --   --   --   BILITOT 1.0  --   --   --   --   --   --   < > = values in this interval not displayed. ------------------------------------------------------------------------------------------------------------------ estimated creatinine clearance is 101.2 ml/min (by C-G formula based on Cr of 0.97). ------------------------------------------------------------------------------------------------------------------ No results found for this basename: HGBA1C,  in the last 72 hours ------------------------------------------------------------------------------------------------------------------ No results found for this basename: CHOL, HDL, LDLCALC, TRIG, CHOLHDL, LDLDIRECT,  in the last 72 hours ------------------------------------------------------------------------------------------------------------------ No results found for this basename: TSH, T4TOTAL, FREET3, T3FREE, THYROIDAB,  in the last 72 hours ------------------------------------------------------------------------------------------------------------------ No results found for  this basename: VITAMINB12, FOLATE, FERRITIN, TIBC, IRON, RETICCTPCT,  in the last 72 hours  Coagulation profile No results found for this basename: INR, PROTIME,  in the last 168 hours  No results found for this basename: DDIMER,  in the last 72 hours  Cardiac Enzymes No results found for this basename: CK, CKMB, TROPONINI, MYOGLOBIN,  in the last 168 hours ------------------------------------------------------------------------------------------------------------------ No components found with this basename: POCBNP,     Deaunna Olarte D.O. on 06/15/2014 at 11:25 AM  Between 7am to 7pm - Pager - [phone]  After 7pm go to [email or web address] - password [email or web address]  And look for the night coverage person covering for me after hours  [facility] Office  [phone]

## 2014-06-15 NOTE — Progress Notes (Signed)
SW spoke with pt in regards to disposition. Pt reported that he would like to go to [facility] or [facility]. SW spoke with [facility] who reported that pt would only be able to keep him for 2 weeks however pt needs to stay longer. SW spoke with [facility] who has requested pt's information to be faxed over. Information faxed. Pt was faxed out to facilities in [facility], will give bed offers tomorrow. Will continue to follow.  Charlene Brooke, MSW Clinical Social Worker [phone]

## 2014-06-15 NOTE — Progress Notes (Signed)
Physical Therapy Treatment Patient Details Name: Jerry [name] MRN: [id number] DOB: 1945/12/29 Today's Date: 06/15/2014    History of Present Illness 68 y.o. male with a history of hypertension, prostate cancer status post suprapubic, that presented to the emergency department with complaints of generalized weakness. Pt adm due to Sepsis secondary to urinary tract infection vs GPC bacteremia.     PT Comments    Patient progressing towards physical therapy goals. Improving ambulatory distance to 80 feet with min assist today while using a rolling walker. Motivated to improve his functional independence. Tolerating therapeutic exercises well. Patient will continue to benefit from skilled physical therapy services to further improve independence with functional mobility.   Follow Up Recommendations  SNF;Supervision/Assistance - 24 hour     Equipment Recommendations  None recommended by PT    Recommendations for Other Services OT consult     Precautions / Restrictions Precautions Precautions: Fall Precaution Comments: recent fall  Restrictions Weight Bearing Restrictions: No    Mobility  Bed Mobility                  Transfers Overall transfer level: Needs assistance Equipment used: Rolling walker (2 wheeled) Transfers: Sit to/from Stand Sit to Stand: Mod assist;Min assist         General transfer comment: Min assist for boost from recliner, Mod assist for boost from toilet. VC for hand placement. Good control with descent onto recliner, however required VC for descent and hand placement for descent onto toilet  Ambulation/Gait Ambulation/Gait assistance: Min assist Ambulation Distance (Feet): 80 Feet (additonal bout of 15) Assistive device: Rolling walker (2 wheeled) Gait Pattern/deviations: Step-through pattern;Decreased step length - left;Decreased stride length;Decreased dorsiflexion - left;Shuffle;Trunk flexed Gait velocity: decreased   General Gait  Details: VC for upright posture and forward gaze. Intermittent cues for Lt foot clearance during swing limb advancement. Min assist for stability with rolling walker. Required 2 standing rest breaks to complete distance due to fatigue.   Stairs            Wheelchair Mobility    Modified Rankin (Stroke Patients Only)       Balance                                    Cognition Arousal/Alertness: Awake/alert Behavior During Therapy: WFL for tasks assessed/performed Overall Cognitive Status: Within Functional Limits for tasks assessed                      Exercises General Exercises - Lower Extremity Ankle Circles/Pumps: AROM;Both;10 reps;Seated Long Arc Quad: Strengthening;Both;10 reps;Seated Hip Flexion/Marching: Strengthening;Both;10 reps;Seated    General Comments General comments (skin integrity, edema, etc.): Discussed d/c planning and need for further PT before returning home alone.      Pertinent Vitals/Pain Pain Assessment: 0-10 Pain Score:  ("doing pretty good") Pain Intervention(s): Limited activity within patient's tolerance;Monitored during session;Repositioned    Home Living                      Prior Function            PT Goals (current goals can now be found in the care plan section) Acute Rehab PT Goals PT Goal Formulation: With patient Time For Goal Achievement: 06/18/14 Potential to Achieve Goals: Fair Progress towards PT goals: Progressing toward goals    Frequency  Min 3X/week    PT Plan Current  plan remains appropriate    Co-evaluation             End of Session   Activity Tolerance: Patient tolerated treatment well Patient left: with call bell/phone within reach;in chair;with nursing/sitter in room     Time: 1003-1030 PT Time Calculation (min): 27 min  Charges:  $Gait Training: 8-22 mins $Therapeutic Activity: 8-22 mins                    G Codes:      [employer],  [place]  Ellouise Newer 06/15/2014, 11:09 AM

## 2014-06-15 NOTE — Progress Notes (Signed)
Pt refuses cpap at this time.  Rt will continue to monitor.

## 2014-06-15 NOTE — Care Management Note (Signed)
  Page 1 of 1   06/15/2014     3:43:14 PM CARE MANAGEMENT NOTE 06/15/2014  Patient:  EATHON, VALADE   Account Number:  [id number]  Date Initiated:  06/15/2014  Documentation initiated by:  Magdalen Spatz  Subjective/Objective Assessment:     Action/Plan:   Anticipated DC Date:  06/16/2014   Anticipated DC Plan:  SKILLED NURSING FACILITY  In-house referral  Clinical Social Worker         Choice offered to / List presented to:             Status of service:  Completed, signed off Medicare Important Message given?  YES (If response is "NO", the following Medicare IM given date fields will be blank) Date Medicare IM given:  06/15/2014 Medicare IM given by:  Magdalen Spatz Date Additional Medicare IM given:   Additional Medicare IM given by:    Discharge Disposition:    Per UR Regulation:    If discussed at Long Length of Stay Meetings, dates discussed:    Comments:

## 2014-06-15 NOTE — Progress Notes (Signed)
[facility]  Date of Admission:  06/10/2014  Antibiotics: Ancef  Subjective: No new complaints, appreciative of care  Objective: Temp:  [97.4 F (36.3 C)-98 F (36.7 C)] 97.4 F (36.3 C) (08/31 0517) Pulse Rate:  [53-58] 53 (08/31 0517) Resp:  [17-19] 17 (08/31 0517) BP: (115-119)/(58-62) 119/62 mmHg (08/31 0517) SpO2:  [93 %-94 %] 93 % (08/31 0517) Weight:  [262 lb (118.842 kg)] 262 lb (118.842 kg) (08/31 0517)  General: awake and alert Skin: no rashes Lungs: CTA B Cor: RRR Abdomen: soft, nt, nd Ext: no edema  Lab Results Lab Results  Component Value Date   WBC 8.0 06/15/2014   HGB 13.5 06/15/2014   HCT 39.9 06/15/2014   MCV 90.3 06/15/2014   PLT 207 06/15/2014    Lab Results  Component Value Date   CREATININE 0.97 06/15/2014   BUN 14 06/15/2014   NA 139 06/15/2014   K 4.2 06/15/2014   CL 98 06/15/2014   CO2 31 06/15/2014    Lab Results  Component Value Date   ALT 19 06/10/2014   AST 33 06/10/2014   ALKPHOS 63 06/10/2014   BILITOT 1.0 06/10/2014      Microbiology: Recent Results (from the past 240 hour(s))  CULTURE, BLOOD (ROUTINE X 2)     Status: None   Collection Time    06/10/14  6:45 AM      Result Value Ref Range Status   Specimen Description BLOOD FOREARM RIGHT   Final   Special Requests BOTTLES DRAWN AEROBIC AND ANAEROBIC 10CC   Final   Culture  Setup Time     Final   Value: 06/10/2014 11:50     Performed at [facility]   Culture     Final   Value: STAPHYLOCOCCUS AUREUS     Note: RIFAMPIN AND GENTAMICIN SHOULD NOT BE USED AS SINGLE DRUGS FOR TREATMENT OF STAPH INFECTIONS.     Note: Gram Stain Report Called to,Read Back By and Verified With: IMELDA SAULDIGO 06/11/14 0635A [place]     Performed at [facility]   Report Status 06/13/2014 FINAL   Final   Organism ID, Bacteria STAPHYLOCOCCUS AUREUS   Final  CULTURE, BLOOD (ROUTINE X 2)     Status: None   Collection Time    06/10/14  6:50 AM      Result Value  Ref Range Status   Specimen Description BLOOD LEFT ANTECUBITAL   Final   Special Requests BOTTLES DRAWN AEROBIC AND ANAEROBIC 10CC   Final   Culture  Setup Time     Final   Value: 06/10/2014 11:50     Performed at [facility]   Culture     Final   Value: STAPHYLOCOCCUS AUREUS     Note: SUSCEPTIBILITIES PERFORMED ON PREVIOUS CULTURE WITHIN THE LAST 5 DAYS.     Note: Gram Stain Report Called to,Read Back By and Verified With: IMELDA SAULDIGO 06/11/14 0635A [facility]     Performed at [facility]   Report Status 06/13/2014 FINAL   Final  URINE CULTURE     Status: None   Collection Time    06/10/14  7:36 AM      Result Value Ref Range Status   Specimen Description URINE, RANDOM   Final   Special Requests NONE   Final   Culture  Setup Time     Final   Value: 06/10/2014 11:57     Performed at [facility]  Colony Count     Final   Value: >=100,000 COLONIES/ML     Performed at [facility]   Culture     Final   Value: Multiple bacterial morphotypes present, none predominant. Suggest appropriate recollection if clinically indicated.     Performed at [facility]   Report Status 06/11/2014 FINAL   Final  CULTURE, BLOOD (ROUTINE X 2)     Status: None   Collection Time    06/12/14  1:15 PM      Result Value Ref Range Status   Specimen Description BLOOD LEFT HAND   Final   Special Requests BOTTLES DRAWN AEROBIC AND ANAEROBIC 5CC   Final   Culture  Setup Time     Final   Value: 06/12/2014 17:16     Performed at [facility]   Culture     Final   Value:        BLOOD CULTURE RECEIVED NO GROWTH TO DATE CULTURE WILL BE HELD FOR 5 DAYS BEFORE ISSUING A FINAL NEGATIVE REPORT     Performed at [facility]   Report Status PENDING   Incomplete  CULTURE, BLOOD (ROUTINE X 2)     Status: None   Collection Time    06/12/14  1:25 PM      Result Value Ref Range Status   Specimen Description BLOOD RIGHT ARM   Final   Special Requests BOTTLES DRAWN  AEROBIC AND ANAEROBIC 5CC   Final   Culture  Setup Time     Final   Value: 06/12/2014 17:16     Performed at [facility]   Culture     Final   Value:        BLOOD CULTURE RECEIVED NO GROWTH TO DATE CULTURE WILL BE HELD FOR 5 DAYS BEFORE ISSUING A FINAL NEGATIVE REPORT     Performed at [facility]   Report Status PENDING   Incomplete    Studies/Results: No results found.  Assessment/Plan: 1) MSSA bacteremia - TTE ok but refused TEE so will treat for 6 weeks through Oct 8th.  Blood culture 8/28 ngtd so ok to put in picc today.   We will arrange follow up in [facility] in about 2-3 weeks Ancef per facility protocol Weekly cbc, cmp to [facility]  I will sign off, thanks  Scharlene Gloss, [facility] [place] [email or web address] [phone] pager   [phone] cell 06/15/2014, 4:15 PM

## 2014-06-16 DIAGNOSIS — A4189 Other specified sepsis: Secondary | ICD-10-CM | POA: Diagnosis not present

## 2014-06-16 DIAGNOSIS — B9561 Methicillin susceptible Staphylococcus aureus infection as the cause of diseases classified elsewhere: Secondary | ICD-10-CM | POA: Diagnosis not present

## 2014-06-16 DIAGNOSIS — G4730 Sleep apnea, unspecified: Secondary | ICD-10-CM | POA: Diagnosis not present

## 2014-06-16 DIAGNOSIS — E669 Obesity, unspecified: Secondary | ICD-10-CM | POA: Diagnosis not present

## 2014-06-16 DIAGNOSIS — A419 Sepsis, unspecified organism: Secondary | ICD-10-CM | POA: Diagnosis not present

## 2014-06-16 DIAGNOSIS — N390 Urinary tract infection, site not specified: Secondary | ICD-10-CM | POA: Diagnosis not present

## 2014-06-16 DIAGNOSIS — A4159 Other Gram-negative sepsis: Secondary | ICD-10-CM | POA: Diagnosis not present

## 2014-06-16 DIAGNOSIS — G4733 Obstructive sleep apnea (adult) (pediatric): Secondary | ICD-10-CM | POA: Diagnosis not present

## 2014-06-16 DIAGNOSIS — M130 Polyarthritis, unspecified: Secondary | ICD-10-CM | POA: Diagnosis not present

## 2014-06-16 DIAGNOSIS — C61 Malignant neoplasm of prostate: Secondary | ICD-10-CM | POA: Diagnosis not present

## 2014-06-16 DIAGNOSIS — R5381 Other malaise: Secondary | ICD-10-CM | POA: Diagnosis not present

## 2014-06-16 DIAGNOSIS — N179 Acute kidney failure, unspecified: Secondary | ICD-10-CM | POA: Diagnosis not present

## 2014-06-16 DIAGNOSIS — Z23 Encounter for immunization: Secondary | ICD-10-CM | POA: Diagnosis not present

## 2014-06-16 DIAGNOSIS — Z452 Encounter for adjustment and management of vascular access device: Secondary | ICD-10-CM | POA: Diagnosis not present

## 2014-06-16 DIAGNOSIS — W19XXXA Unspecified fall, initial encounter: Secondary | ICD-10-CM | POA: Diagnosis not present

## 2014-06-16 DIAGNOSIS — I5032 Chronic diastolic (congestive) heart failure: Secondary | ICD-10-CM | POA: Diagnosis not present

## 2014-06-16 DIAGNOSIS — R7881 Bacteremia: Secondary | ICD-10-CM | POA: Diagnosis not present

## 2014-06-16 DIAGNOSIS — I10 Essential (primary) hypertension: Secondary | ICD-10-CM | POA: Diagnosis not present

## 2014-06-16 DIAGNOSIS — A4901 Methicillin susceptible Staphylococcus aureus infection, unspecified site: Secondary | ICD-10-CM | POA: Diagnosis not present

## 2014-06-16 DIAGNOSIS — E870 Hyperosmolality and hypernatremia: Secondary | ICD-10-CM | POA: Diagnosis not present

## 2014-06-16 DIAGNOSIS — Z8546 Personal history of malignant neoplasm of prostate: Secondary | ICD-10-CM | POA: Diagnosis not present

## 2014-06-16 DIAGNOSIS — I509 Heart failure, unspecified: Secondary | ICD-10-CM | POA: Diagnosis not present

## 2014-06-16 DIAGNOSIS — R5383 Other fatigue: Secondary | ICD-10-CM | POA: Diagnosis not present

## 2014-06-16 DIAGNOSIS — E871 Hypo-osmolality and hyponatremia: Secondary | ICD-10-CM | POA: Diagnosis not present

## 2014-06-16 DIAGNOSIS — Z9181 History of falling: Secondary | ICD-10-CM | POA: Diagnosis not present

## 2014-06-16 DIAGNOSIS — N400 Enlarged prostate without lower urinary tract symptoms: Secondary | ICD-10-CM | POA: Diagnosis not present

## 2014-06-16 DIAGNOSIS — I5030 Unspecified diastolic (congestive) heart failure: Secondary | ICD-10-CM | POA: Diagnosis not present

## 2014-06-16 MED ORDER — CEFAZOLIN SODIUM-DEXTROSE 2-3 GM-% IV SOLR: 2.0000 g | Freq: Three times a day (TID) | INTRAVENOUS | Status: DC

## 2014-06-16 MED ORDER — HEPARIN SOD (PORK) LOCK FLUSH 100 UNIT/ML IV SOLN
250.0000 [IU] | Freq: Every day | INTRAVENOUS | Status: DC
  Filled 2014-06-16: qty 3

## 2014-06-16 MED ORDER — ZOLPIDEM TARTRATE 5 MG PO TABS: 5.0000 mg | ORAL_TABLET | Freq: Every evening | ORAL | Status: DC | PRN

## 2014-06-16 MED ORDER — ACETAMINOPHEN 325 MG PO TABS: 650.0000 mg | ORAL_TABLET | Freq: Four times a day (QID) | ORAL | Status: DC | PRN

## 2014-06-16 MED ORDER — HEPARIN SOD (PORK) LOCK FLUSH 100 UNIT/ML IV SOLN: 250.0000 [IU] | Freq: Every day | INTRAVENOUS | Status: DC

## 2014-06-16 MED ORDER — GLUCERNA SHAKE PO LIQD: 237.0000 mL | Freq: Two times a day (BID) | ORAL | Status: DC

## 2014-06-16 MED ORDER — HEPARIN SOD (PORK) LOCK FLUSH 100 UNIT/ML IV SOLN
250.0000 [IU] | INTRAVENOUS | Status: DC | PRN
  Administered 2014-06-16: 250 [IU]
  Filled 2014-06-16: qty 3

## 2014-06-16 MED ORDER — SODIUM CHLORIDE 0.9 % IJ SOLN: 10.0000 mL | INTRAMUSCULAR | Status: DC | PRN

## 2014-06-16 MED ORDER — HEPARIN SOD (PORK) LOCK FLUSH 100 UNIT/ML IV SOLN: 250.0000 [IU] | INTRAVENOUS | Status: DC | PRN

## 2014-06-16 NOTE — Progress Notes (Signed)
Patient for discharge to SNF bed at [facility]  today. Plans confirmed with patient and family who are in agreement. SNF is prepared for patient and we will  plan transfer via [facility].    Charlene Brooke, MSW Clinical Social Worker [phone]               .

## 2014-06-16 NOTE — Progress Notes (Signed)
Report called to [facility] at [facility] and was given to [place], Therapist, sports. All questions were answered. Pt will be ready for discharge when non-emergent transport arrives.

## 2014-06-16 NOTE — Discharge Instructions (Signed)
Bacteremia °Bacteremia occurs when bacteria get in your blood. Normal blood does not usually have bacteria. Bacteremia is one way infections can spread from one part of the body to another. °CAUSES  °· Causes may include anything that allows bacteria to get into the body. Examples are: °¨ Catheters. °¨ Intravenous (IV) access tubes. °¨ Cuts or scrapes of the skin. °· Temporary bacteremia may occur during dental procedures, while brushing your teeth, or during a bowel movement. This rarely causes any symptoms or medical problems. °· Bacteria may also get in the bloodstream as a complication of a bacterial infection elsewhere. This includes infected wounds and bacterial infections of the: °¨ Lungs (pneumonia). °¨ Kidneys (pyelonephritis). °¨ Intestines (enteritis, colitis). °¨ Organs in the abdomen (appendicitis, cholecystitis, diverticulitis). °SYMPTOMS  °The body is usually able to clear small numbers of bacteria out of the blood quickly. Brief bacteremia usually does not cause problems.  °· Problems can occur if the bacteria start to grow in number or spread to other parts of the body. If the bacteria start growing, you may develop: °¨ Chills. °¨ Fever. °¨ Nausea. °¨ Vomiting. °¨ Sweating. °¨ Lightheadedness and low blood pressure. °¨ Pain. °· If bacteria start to grow in the linings around the brain, it is called meningitis. This can cause severe headaches, many other problems, and even death. °· If bacteria start to grow in a joint, it causes arthritis with painful joints. If bacteria start to grow in a bone, it is called osteomyelitis. °· Bacteria from the blood can also cause sores (abscesses) in many organs, such as the muscle, liver, spleen, lungs, brain, and kidneys. °DIAGNOSIS  °· This condition is diagnosed by cultures of the blood. °· Cultures may also be taken from other parts of the body that are thought to be causing the bacteremia. A small piece of tissue, fluid, or other product of the body is  sampled. The sample is then put on a growth plate to see if any bacteria grows. °· Other lab tests may be done and the results may be abnormal. °TREATMENT  °Treatment requires a stay in the hospital. You will be given antibiotic medicine through an IV access tube. °PREVENTION  °People with an increased risk of developing bacteremia or complications may be given antibiotics before certain procedures. Examples are: °· A person with a heart murmur or artificial heart valve, before having his or her teeth cleaned. °· Before having a surgical or other invasive procedure. °· Before having a bowel procedure. °Document Released: 07/16/2006 Document Revised: 12/25/2011 Document Reviewed: 04/27/2011 °[employer]® Patient Information ©2015 [employer]. This information is not intended to replace advice given to you by your health care provider. Make sure you discuss any questions you have with your health care provider. ° °

## 2014-06-16 NOTE — Discharge Summary (Addendum)
Physician Discharge Summary  Jerry [name] WUJ:[id number] DOB: 12-04-1945 DOA: 06/10/2014  PCP: Cammy Copa, MD  Admit date: 06/10/2014 Discharge date: 06/16/2014  Time spent: 45 minutes  Recommendations for Outpatient Follow-up:  Patient will be discharged to [facility].  Patient will continue need to continue physical therapy as well as occupational therapy as recommended by the facility. Patient to continue his medications as prescribed. Patient to followup with his primary care physician within one to 2 weeks of discharge. Patient also need to follow up with Dr. Linus Salmons within 2-3 weeks of discharge. Patient should follow a heart healthy diet. Patient should also follow up with his urologist regarding his suprapubic catheter.  Discharge Diagnoses:  Sepsis secondary to urinary tract infection/staph aureus bacteremia Generalized weakness recent fall Hyponatremia and hypochloremia Hypertension Diastolic heart failure History of prostate cancer with suprapubic cath Arthritis Obesity Obstructive sleep apnea  Discharge Condition: Stable  Diet recommendation: Heart healthy  Filed Weights   06/14/14 0520 06/15/14 0517 06/16/14 0639  Weight: 149.596 kg (329 lb 12.8 oz) 118.842 kg (262 lb) 107.049 kg (236 lb)    History of present illness:  on 06/10/2014  Jerry [name] is a 68 y.o. male with a history of hypertension, prostate cancer status post suprapubic, that presented to the emergency department with complaints of generalized weakness. Patient stated he had been unable to move around since this past Sunday. Patient normally goes to the gym, approximately 4 times per week, and had to call his personal trainer to help him get up from the recliner. Patient was able to walk around his house with his walker however once in the bathroom patient stated his knees buckled. Patient then fell. He denied hitting his head. Patient also stated he has had poor appetite since this past  Sunday. Patient complained of weakness however denied any numbness or tingling, dizziness, chest pain, shortness of breath, abdominal pain. Patient complained of back pain, as well as arthritis pain. Denied any recent travel or sick contacts.  Hospital Course:  Sepsis secondary to urinary tract infection/ staph aureus bacteremia  -Patient did have leukocytosis and was febrile upon admission, however resolved.  -Continue cefepime and IV fluids  -UA: Positive nitrites, large leukocytes,TNTC WBC, many bacteria  -Urine culture >100K colonies, mutliple bacterial morphotypes  -Spoke to Dr. Matilde Sprang, urology, regarding changing suprapubic catheter, he recommended leaving the catheter in place and he should follow up with his urologist as an outpatient.  -Blood cultures +staph aureus from8/26  -Blood cultures 8/28 show no growth to date  -Echocardiogram: EF 55-60%  -ID consulted and appreciated  -Patient is apprehensive regarding TTE and PICC line placement.  -Spoke with infectious disease, Dr. Tommy Medal, who recommended obtaining a lumbar MRI to rule out abscess, as well as discontinuing vancomycin. Recommends 4-6 weeks of IV antibiotics.  -MRI lumbar spine: No evidence of discitis/osteomyelitis or epidural abscess  -Patient will need to follow up with Dr. Linus Salmons within 2-3 weeks of discharge. -PICC line ordered   Generalized weakness with recent fall  -Likely secondary to urinary tract infection  -CK 860  -TSH 2.5  -PT and OT consulted for evaluation and treatment and recommended SNF   Hyponatremia and hypochloremia  -Resolved, likely secondary to poor intake and dehydration vs lasix   Hypertension  -Blood pressure currently controlled   Diastolic heart failure  -Currently compensated  -Echocardiogram in January 2014 showed grade 2 diastolic dysfunction, EF of 50-55%  -Echocardiogram 06/12/2014: EF 95-62%, diastolic function parameters were normal  -Continue to monitor  intake and output as  well as daily weights  -Continue lasix   History of prostate cancer with suprapubic cath  -Patient will need to follow up with his urologist as an outpatient   Arthritis  -Continue Mobic and morphine PRN   Obesity  -Patient will need to continue his exercise program as well as diet modifications once discharged   OSA  -CPAP QHS   Procedures: Echocardiogram  Study Conclusions - Left ventricle: The cavity size was normal. Wall thickness was increased in a pattern of mild LVH. Systolic function was normal. The estimated ejection fraction was in the range of 55% to 60%. Regional wall motion abnormalities cannot be excluded. Left ventricular diastolic function parameters were normal. - Aortic valve: There was mild regurgitation. - Left atrium: The atrium was mildly dilated. - Right ventricle: The cavity size was mildly dilated. - Right atrium: The atrium was moderately dilated. - Tricuspid valve: There was moderate regurgitation. - Pulmonary arteries: PA peak pressure: 32 mm Hg (S).   Placement of PICC  Consultations: Urology, Dr. Matilde Sprang, via phone  Infectious disease   Discharge Exam: Filed Vitals:   06/16/14 1358  BP: 116/63  Pulse: 67  Temp: 97.9 F (36.6 C)  Resp: 16   Exam  General: Well developed, well nourished, NAD, appears stated age  HEENT: NCAT, mucous membranes moist.  Cardiovascular: S1 S2 auscultated, Regular rate and rhythm.  Respiratory: Clear to auscultation bilaterally with equal chest rise  Abdomen: Soft, obese, nontender, nondistended, + bowel sounds, suprapubic cath in place  Extremities: warm dry without cyanosis clubbing or edema  Neuro: AAOx3, no focal deficits  Psych: Normal affect and demeanor   Discharge Instructions      Discharge Instructions   Discharge instructions    Complete by:  As directed   Patient will be discharged to [facility].  Patient will continue need to continue physical therapy as well as occupational  therapy as recommended by the facility. Patient to continue his medications as prescribed. Patient to followup with his primary care physician within one to 2 weeks of discharge. Patient also need to follow up with Dr. Linus Salmons within 2-3 weeks of discharge. Patient should follow a heart healthy diet. Patient should also follow up with his urologist regarding his suprapubic catheter.            Medication List         acetaminophen 325 MG tablet  Commonly known as:  TYLENOL  Take 2 tablets (650 mg total) by mouth every 6 (six) hours as needed for mild pain (or Fever >/= 101).     aspirin 325 MG tablet  Take 325 mg by mouth daily.     CALCIUM 1000 + D PO  Take 1 tablet by mouth daily.     ceFAZolin 2-3 GM-% Solr  Commonly known as:  ANCEF  Inject 50 mLs (2 g total) into the vein every 8 (eight) hours.     CINNAMON PO  Take 1 capsule by mouth 4 (four) times daily.     CO Q 10 PO  Take 1 tablet by mouth 2 (two) times daily.     docusate sodium 100 MG capsule  Commonly known as:  COLACE  Take 100 mg by mouth 2 (two) times daily as needed for mild constipation.     feeding supplement (GLUCERNA SHAKE) Liqd  Take 237 mLs by mouth 2 (two) times daily between meals.     furosemide 20 MG tablet  Commonly known as:  LASIX  Take 20 mg by mouth daily.     heparin lock flush 100 UNIT/ML Soln injection  2.5 mLs (250 Units total) by Intracatheter route daily.     heparin lock flush 100 UNIT/ML Soln injection  2.5 mLs (250 Units total) by Intracatheter route as needed (line care).     meloxicam 7.5 MG tablet  Commonly known as:  MOBIC  Take 7.5 mg by mouth daily.     MULTIVITAMIN PO  Take 1 tablet by mouth daily.     OVER THE COUNTER MEDICATION  Take 1 tablet by mouth daily. "Grape Seed Extract"     POTASSIUM PO  Take 1 tablet by mouth 2 (two) times daily. OTC     VITAMIN B-12 PO  Take 2 tablets by mouth daily.     vitamin E 1000 UNIT capsule  Take 1,000 Units by mouth 2  (two) times daily.     ZINC PO  Take 1 tablet by mouth daily.     zolpidem 5 MG tablet  Commonly known as:  AMBIEN  Take 1 tablet (5 mg total) by mouth at bedtime as needed for sleep.       No Known Allergies Follow-up Information   Follow up with Cammy Copa, MD. Schedule an appointment as soon as possible for a visit in 1 week. [facility] followup)    Specialty:  Family Medicine   Contact information:   [phone] [address], [address] [place] [address] [phone]       Follow up with Scharlene Gloss, MD. Schedule an appointment as soon as possible for a visit in 2 weeks. [facility] Followup)    Specialty:  Infectious Diseases   Contact information:   [address] [address] [phone]       Schedule an appointment as soon as possible for a visit with Urologist. (Hosptial followup)        The results of significant diagnostics from this hospitalization (including imaging, microbiology, ancillary and laboratory) are listed below for reference.    Significant Diagnostic Studies: Dg Chest 2 View  06/10/2014   CLINICAL DATA:  Injury, fall, back pain, cough, fever  EXAM: CHEST  2 VIEW  COMPARISON:  10/25/2012  FINDINGS: Chronic elevation RIGHT diaphragm.  Normal heart size, mediastinal contours and pulmonary vascularity.  Chronic bronchitic changes and RIGHT basilar atelectasis versus scarring.  Remaining lungs clear.  No pleural effusion or pneumothorax.  Scattered endplate spur formation thoracic spine.  IMPRESSION: Chronic elevation of RIGHT diaphragm with RIGHT basilar atelectasis versus scarring.  Chronic bronchitic changes without pulmonary infiltrate.   Electronically Signed   By: Lavonia Dana M.D.   On: 06/10/2014 07:48   Dg Lumbar Spine Complete  06/10/2014   CLINICAL DATA:  Fall.  EXAM: LUMBAR SPINE - COMPLETE 4+ VIEW  COMPARISON:  06/04/2007.  FINDINGS: Bilateral nephrolithiasis with particularly prominent staghorn calculus left kidney.  Aortoiliac atherosclerotic vascular disease. Diffuse degenerative changes lumbar spine and both hips. Minimal 6 mm anterolisthesis L4 on L5. This is most likely degenerative. No evidence of fracture. Normal bony mineralization.  IMPRESSION: 1. Diffuse degenerative changes lumbar spine with minimal 6 mm anterolisthesis L4 on L5. This is most likely degenerative. No acute bony abnormality. 2. Bilateral nephrolithiasis with 50 prominent staghorn calculus left kidney. 3. Aortoiliac atherosclerotic vascular disease.   Electronically Signed   By: Marcello Moores  Register   On: 06/10/2014 07:51   Ct Head Wo Contrast  06/10/2014   CLINICAL DATA:  Fall with  intractable pain. Diffuse headache. Posterior neck tenderness.  EXAM: CT HEAD WITHOUT CONTRAST  CT CERVICAL SPINE WITHOUT CONTRAST  TECHNIQUE: Multidetector CT imaging of the head and cervical spine was performed following the standard protocol without intravenous contrast. Multiplanar CT image reconstructions of the cervical spine were also generated.  COMPARISON:  CT head 10/25/2012 and MR cervical spine 05/11/2007.  FINDINGS: CT HEAD FINDINGS  No evidence of an acute infarct, acute hemorrhage, mass lesion, mass effect or hydrocephalus. Atrophy. Periventricular low attenuation. There may be a tiny remote infarct in the left cerebellar hemisphere (image 8). Visualized portions of the paranasal sinuses and mastoid air cells are clear. No fracture.  CT CERVICAL SPINE FINDINGS  Alignment is anatomic. Patient is status post C3-6 anterior cervical fusion. No subluxation or fracture. Endplate degenerative changes and uncovertebral hypertrophy are seen at C2-3, C6-7 and C7-T1. Loss of disc space height at C6-7 and C7-T1. Multilevel facet hypertrophy.  At C2-3, severe right neural foraminal narrowing.  At C3-4, moderate right neural foraminal narrowing.  At C4-5, moderate to severe bilateral neural foraminal narrowing.  At C5-6, moderate to severe bilateral neural foraminal  narrowing.  At C6-7, severe bilateral neural foraminal narrowing.  Visualized lung apices are unremarkable. Soft tissues are unremarkable.  IMPRESSION: 1. No acute intracranial abnormality. 2. No acute fracture or subluxation in the cervical spine. 3. Atrophy and chronic microvascular white matter ischemic changes. 4. C3-6 anterior cervical fusion. 5. Multilevel spondylosis.   Electronically Signed   By: Lorin Picket M.D.   On: 06/10/2014 07:38   Ct Cervical Spine Wo Contrast  06/10/2014   CLINICAL DATA:  Fall with intractable pain. Diffuse headache. Posterior neck tenderness.  EXAM: CT HEAD WITHOUT CONTRAST  CT CERVICAL SPINE WITHOUT CONTRAST  TECHNIQUE: Multidetector CT imaging of the head and cervical spine was performed following the standard protocol without intravenous contrast. Multiplanar CT image reconstructions of the cervical spine were also generated.  COMPARISON:  CT head 10/25/2012 and MR cervical spine 05/11/2007.  FINDINGS: CT HEAD FINDINGS  No evidence of an acute infarct, acute hemorrhage, mass lesion, mass effect or hydrocephalus. Atrophy. Periventricular low attenuation. There may be a tiny remote infarct in the left cerebellar hemisphere (image 8). Visualized portions of the paranasal sinuses and mastoid air cells are clear. No fracture.  CT CERVICAL SPINE FINDINGS  Alignment is anatomic. Patient is status post C3-6 anterior cervical fusion. No subluxation or fracture. Endplate degenerative changes and uncovertebral hypertrophy are seen at C2-3, C6-7 and C7-T1. Loss of disc space height at C6-7 and C7-T1. Multilevel facet hypertrophy.  At C2-3, severe right neural foraminal narrowing.  At C3-4, moderate right neural foraminal narrowing.  At C4-5, moderate to severe bilateral neural foraminal narrowing.  At C5-6, moderate to severe bilateral neural foraminal narrowing.  At C6-7, severe bilateral neural foraminal narrowing.  Visualized lung apices are unremarkable. Soft tissues are  unremarkable.  IMPRESSION: 1. No acute intracranial abnormality. 2. No acute fracture or subluxation in the cervical spine. 3. Atrophy and chronic microvascular white matter ischemic changes. 4. C3-6 anterior cervical fusion. 5. Multilevel spondylosis.   Electronically Signed   By: Lorin Picket M.D.   On: 06/10/2014 07:38   Mr Lumbar Spine Wo Contrast  06/13/2014   CLINICAL DATA:  Back pain with limited mobility and weakness for 6 days. History of prostate cancer. Fever with leukocytosis. Evaluate for epidural abscess.  EXAM: MRI LUMBAR SPINE WITHOUT CONTRAST  TECHNIQUE: Multiplanar, multisequence MR imaging of the lumbar spine was performed. No intravenous  contrast was administered.  COMPARISON:  Lumbar spine radiographs 06/10/2014, abdominal pelvic CT 03/27/2013 and lumbar MRI 05/11/2007.  FINDINGS: Study is mildly degraded by body habitus.  Five lumbar type vertebral bodies are assumed. The alignment is stable with a grade 1 anterolisthesis at L4-5. There is chronic ankylosis across the L5-S1 disc manifesting as increased T2 and T1 signal, but no abnormal STIR signal or endplate irregularity. There is no evidence of acute fracture, pars defect or discitis. The lumbar pedicles are diffusely short on a congenital basis. There is no evidence of osseous metastatic disease.  The conus medullaris extends to the L1-2 level and appears normal. No paraspinal abnormalities are identified.  Sagittal images demonstrate stable disc bulging and anterior osteophytes at T11-12 and T12-L1.  L1-2:  Stable mild disc bulging and mild spinal stenosis.  L2-3: Since 2008, there is mildly progressive annular disc bulging eccentric to the right with paraspinal osteophytes and moderate facet and ligamentous hypertrophy. There is mildly progressive spinal stenosis, now moderate. In addition, there is right-greater-than-left lateral recess stenosis. The foramina are sufficiently patent.  L3-4: Mildly progressive annular disc bulging  with facet and ligamentous hypertrophy. The spinal stenosis is mildly progressive, now moderate to severe. There is mild lateral recess and foraminal stenosis bilaterally.  L4-5: There is severe multifactorial spinal stenosis secondary to annular disc bulging, facet and ligamentous hypertrophy. The spinal canal is nearly completely obliterated. There is severe lateral recess and moderate foraminal stenosis bilaterally. These findings are chronic and only minimally progressive from 2008.  L5-S1: Ankylosed disc space with small posterior osteophytes and mild bilateral facet hypertrophy. Mild foraminal narrowing is stable.  IMPRESSION: 1. No evidence of discitis/osteomyelitis or epidural abscess. No acute findings are demonstrated. 2. Again demonstrated is multilevel spondylosis superimposed on a congenitally small canal. The spondylosis has slowly progressed from 2008. 3. There is severe/critical multifactorial spinal stenosis at L4-5. 4. Moderate to severe spinal stenosis is present at L3-4 and moderate spinal stenosis at L2-3.   Electronically Signed   By: Camie Patience M.D.   On: 06/13/2014 13:40    Microbiology: Recent Results (from the past 240 hour(s))  CULTURE, BLOOD (ROUTINE X 2)     Status: None   Collection Time    06/10/14  6:45 AM      Result Value Ref Range Status   Specimen Description BLOOD FOREARM RIGHT   Final   Special Requests BOTTLES DRAWN AEROBIC AND ANAEROBIC 10CC   Final   Culture  Setup Time     Final   Value: 06/10/2014 11:50     Performed at [facility]   Culture     Final   Value: STAPHYLOCOCCUS AUREUS     Note: RIFAMPIN AND GENTAMICIN SHOULD NOT BE USED AS SINGLE DRUGS FOR TREATMENT OF STAPH INFECTIONS.     Note: Gram Stain Report Called to,Read Back By and Verified With: IMELDA SAULDIGO 06/11/14 0635A [facility]     Performed at [facility]   Report Status 06/13/2014 FINAL   Final   Organism ID, Bacteria STAPHYLOCOCCUS AUREUS   Final  CULTURE, BLOOD (ROUTINE  X 2)     Status: None   Collection Time    06/10/14  6:50 AM      Result Value Ref Range Status   Specimen Description BLOOD LEFT ANTECUBITAL   Final   Special Requests BOTTLES DRAWN AEROBIC AND ANAEROBIC 10CC   Final   Culture  Setup Time     Final   Value: 06/10/2014  11:50     Performed at [facility]     Final   Value: STAPHYLOCOCCUS AUREUS     Note: SUSCEPTIBILITIES PERFORMED ON PREVIOUS CULTURE WITHIN THE LAST 5 DAYS.     Note: Gram Stain Report Called to,Read Back By and Verified With: IMELDA SAULDIGO 06/11/14 0635A [facility]     Performed at [facility]   Report Status 06/13/2014 FINAL   Final  URINE CULTURE     Status: None   Collection Time    06/10/14  7:36 AM      Result Value Ref Range Status   Specimen Description URINE, RANDOM   Final   Special Requests NONE   Final   Culture  Setup Time     Final   Value: 06/10/2014 11:57     Performed at [facility]     Final   Value: >=100,000 COLONIES/ML     Performed at [facility]   Culture     Final   Value: Multiple bacterial morphotypes present, none predominant. Suggest appropriate recollection if clinically indicated.     Performed at [facility]   Report Status 06/11/2014 FINAL   Final  CULTURE, BLOOD (ROUTINE X 2)     Status: None   Collection Time    06/12/14  1:15 PM      Result Value Ref Range Status   Specimen Description BLOOD LEFT HAND   Final   Special Requests BOTTLES DRAWN AEROBIC AND ANAEROBIC 5CC   Final   Culture  Setup Time     Final   Value: 06/12/2014 17:16     Performed at [facility]   Culture     Final   Value:        BLOOD CULTURE RECEIVED NO GROWTH TO DATE CULTURE WILL BE HELD FOR 5 DAYS BEFORE ISSUING A FINAL NEGATIVE REPORT     Performed at [facility]   Report Status PENDING   Incomplete  CULTURE, BLOOD (ROUTINE X 2)     Status: None   Collection Time    06/12/14  1:25 PM      Result Value Ref Range Status    Specimen Description BLOOD RIGHT ARM   Final   Special Requests BOTTLES DRAWN AEROBIC AND ANAEROBIC 5CC   Final   Culture  Setup Time     Final   Value: 06/12/2014 17:16     Performed at [facility]   Culture     Final   Value:        BLOOD CULTURE RECEIVED NO GROWTH TO DATE CULTURE WILL BE HELD FOR 5 DAYS BEFORE ISSUING A FINAL NEGATIVE REPORT     Performed at [facility]   Report Status PENDING   Incomplete     Labs: Basic Metabolic Panel:  Recent Labs Lab 06/11/14 0535 06/12/14 0548 06/13/14 0513 06/14/14 0427 06/15/14 0731  NA 136* 141 141 138 139  K 3.9 4.0 3.6* 4.2 4.2  CL 99 105 102 99 98  CO2 24 27 28 31 31   GLUCOSE 117* 119* 120* 119* 104*  BUN 15 13 12 12 14   CREATININE 1.03 0.94 0.86 1.00 0.97  CALCIUM 8.4 8.5 9.0 9.4 9.0   Liver Function Tests:  Recent Labs Lab 06/10/14 0605  AST 33  ALT 19  ALKPHOS 63  BILITOT 1.0  PROT 7.6  ALBUMIN 3.2*   No results found for this basename:  LIPASE, AMYLASE,  in the last 168 hours No results found for this basename: AMMONIA,  in the last 168 hours CBC:  Recent Labs Lab 06/10/14 0605  06/11/14 0535 06/12/14 0548 06/13/14 0513 06/14/14 0427 06/15/14 0731  WBC 13.5*  < > 8.4 7.4 7.3 8.0 8.0  NEUTROABS 11.4*  --   --   --   --   --   --   HGB 14.1  < > 12.2* 12.6* 12.2* 12.8* 13.5  HCT 41.5  < > 37.1* 38.2* 36.5* 39.1 39.9  MCV 90.0  < > 92.1 92.5 89.7 92.4 90.3  PLT 164  < > 144* 142* 164 185 207  < > = values in this interval not displayed. Cardiac Enzymes:  Recent Labs Lab 06/10/14 0605  CKTOTAL 860*   BNP: BNP (last 3 results) No results found for this basename: PROBNP,  in the last 8760 hours CBG:  Recent Labs Lab 06/13/14 1140  GLUCAP 122*    Signed:  Jurell Basista  [facility] 06/16/2014, 3:04 PM

## 2014-06-16 NOTE — Progress Notes (Signed)
Peripherally Inserted Central Catheter/Midline Placement  The IV Nurse has discussed with the patient and/or persons authorized to consent for the patient, the purpose of this procedure and the potential benefits and risks involved with this procedure.  The benefits include less needle sticks, lab draws from the catheter and patient may be discharged home with the catheter.  Risks include, but not limited to, infection, bleeding, blood clot (thrombus formation), and puncture of an artery; nerve damage and irregular heat beat.  Alternatives to this procedure were also discussed.  PICC/Midline Placement Documentation        Jerry [name], Nicolette Bang 06/16/2014, 2:20 PM

## 2014-06-17 ENCOUNTER — Encounter: Payer: Self-pay | Admitting: Internal Medicine

## 2014-06-17 DIAGNOSIS — R29898 Other symptoms and signs involving the musculoskeletal system: Secondary | ICD-10-CM

## 2014-06-17 DIAGNOSIS — M1990 Unspecified osteoarthritis, unspecified site: Secondary | ICD-10-CM

## 2014-06-17 DIAGNOSIS — A419 Sepsis, unspecified organism: Secondary | ICD-10-CM

## 2014-06-17 DIAGNOSIS — G4730 Sleep apnea, unspecified: Secondary | ICD-10-CM

## 2014-06-17 DIAGNOSIS — Z9359 Other cystostomy status: Secondary | ICD-10-CM

## 2014-06-17 DIAGNOSIS — N390 Urinary tract infection, site not specified: Secondary | ICD-10-CM

## 2014-06-17 DIAGNOSIS — G959 Disease of spinal cord, unspecified: Secondary | ICD-10-CM

## 2014-06-17 HISTORY — DX: Other cystostomy status: Z93.59

## 2014-06-17 NOTE — Progress Notes (Deleted)
Patient ID: Jerry [name], male   DOB: [birth year], 68 y.o.   MRN: [id number]  Provider:  Rexene Edison. Mariea Clonts, D.O., C.M.D.  Location:    PCP: Cammy Copa, MD  Code Status: ***  No Known Allergies  Chief Complaint  Patient presents with  . New Admit To SNF    HPI: 68 y.o. male with h/o cervical myelopathy, htn, prostate cancer s/p suprapubic catheter placement was admitted here for short term rehab s/p hospitalization 8/26-06/16/14 with generalized weakness and a recent fall.  He had a poor appetite.  He was found to have sepsis secondary to a UTI.  His suprapubic was not changed, but will require outpatient changing by urology.  His blood cultures initially were positive for staph aureus, but f/u blood cultures negative.  He was treated with vanc initially then changed to cefazolin for a 4-6 week course of IV abx.  MRI lumbar spine was negative for discitis/osteomyelitis.  He is to f/u with Dr. Linus Salmons in [place] in 2-3 wks.  He has chronic diastolic chf based on an old echo, but latest echo was wnl.  He remains on lasix, but appears his potassium was discontinued so needs bmp f/u.  He was getting mobic and prn morphine for his arthritis in the hospital.  He is to wear his CPAP at hs for his OSA.  ROS: ROS   Past Medical History  Diagnosis Date  . Malaria   . Cervical myelopathy   . Coma 1968    "for 2 wks"  . KGMWNUUV(253.6)     "monthly" (06/10/2014)  . Arthritis     "hands, knees, back, legs" (06/10/2014)  . PTSD (post-traumatic stress disorder)     "I'm all over that now"  . Prostate cancer    Past Surgical History  Procedure Laterality Date  . Back surgery  2008    cervical and thoracic decompression for myelopathy  . Prostate surgery  03/2012    "shaved it down; didn't take it off"  . Anterior cervical decomp/discectomy fusion  05/2007    Archie Endo 02/16/2011  . Thoracic laminectomy  05/2007    Archie Endo 02/16/2011  . Shrapnel removal  1968    "HEAD, CHEST, LEFT HAND"    Social History:   reports that he has never smoked. He has never used smokeless tobacco. He reports that he does not drink alcohol or use illicit drugs.  Family History  Problem Relation Age of Onset  . Heart disease Mother   . Heart disease Father   . Dementia Mother   . Dementia Father     Medications: Patient's Medications  New Prescriptions   No medications on file  Previous Medications   ACETAMINOPHEN (TYLENOL) 325 MG TABLET    Take 2 tablets (650 mg total) by mouth every 6 (six) hours as needed for mild pain (or Fever >/= 101).   ASPIRIN 325 MG TABLET    Take 325 mg by mouth daily.   CALCIUM CARB-CHOLECALCIFEROL (CALCIUM 1000 + D PO)    Take 1 tablet by mouth daily.   CYANOCOBALAMIN (VITAMIN B-12 PO)    Take 2 tablets by mouth daily.   DOCUSATE SODIUM (COLACE) 100 MG CAPSULE    Take 100 mg by mouth 2 (two) times daily as needed for mild constipation.   FEEDING SUPPLEMENT, GLUCERNA SHAKE, (GLUCERNA SHAKE) LIQD    Take 237 mLs by mouth 2 (two) times daily between meals.   FUROSEMIDE (LASIX) 20 MG TABLET    Take 20 mg by mouth  daily.   HEPARIN LOCK FLUSH 100 UNIT/ML SOLN INJECTION    2.5 mLs (250 Units total) by Intracatheter route daily.   HEPARIN LOCK FLUSH 100 UNIT/ML SOLN INJECTION    2.5 mLs (250 Units total) by Intracatheter route as needed (line care).   MELOXICAM (MOBIC) 7.5 MG TABLET    Take 7.5 mg by mouth daily.   MULTIPLE VITAMINS-MINERALS (MULTIVITAMIN PO)    Take 1 tablet by mouth daily.   MULTIPLE VITAMINS-MINERALS (ZINC PO)    Take 1 tablet by mouth daily.   VITAMIN E 1000 UNIT CAPSULE    Take 1,000 Units by mouth 2 (two) times daily.   ZOLPIDEM (AMBIEN) 5 MG TABLET    Take 1 tablet (5 mg total) by mouth at bedtime as needed for sleep.  Modified Medications   Modified Medication Previous Medication   CEFAZOLIN (ANCEF) 2-3 GM-% SOLR ceFAZolin (ANCEF) 2-3 GM-% SOLR      Inject 2 g into the vein every 8 (eight) hours. (4-6 wks per d/c summary)    Inject 50 mLs (2  g total) into the vein every 8 (eight) hours.  Discontinued Medications   CINNAMON PO    Take 1 capsule by mouth 4 (four) times daily.   COENZYME Q10 (CO Q 10 PO)    Take 1 tablet by mouth 2 (two) times daily.   OVER THE COUNTER MEDICATION    Take 1 tablet by mouth daily. "Grape Seed Extract"   POTASSIUM PO    Take 1 tablet by mouth 2 (two) times daily. OTC     Physical Exam: Filed Vitals:   06/17/14 1407  BP: 116/63  Pulse: 67  Temp: 97.9 F (36.6 C)  Resp: 16  Height: 6\' 5"  (1.956 m)  Weight: 309 lb (140.161 kg)  SpO2: 93%  Physical Exam  Labs reviewed: Basic Metabolic Panel:  Recent Labs  06/13/14 0513 06/14/14 0427 06/15/14 0731  NA 141 138 139  K 3.6* 4.2 4.2  CL 102 99 98  CO2 28 31 31   GLUCOSE 120* 119* 104*  BUN 12 12 14   CREATININE 0.86 1.00 0.97  CALCIUM 9.0 9.4 9.0   Liver Function Tests:  Recent Labs  06/10/14 0605  AST 33  ALT 19  ALKPHOS 63  BILITOT 1.0  PROT 7.6  ALBUMIN 3.2*   No results found for this basename: LIPASE, AMYLASE,  in the last 8760 hours No results found for this basename: AMMONIA,  in the last 8760 hours CBC:  Recent Labs  06/10/14 0605  06/13/14 0513 06/14/14 0427 06/15/14 0731  WBC 13.5*  < > 7.3 8.0 8.0  NEUTROABS 11.4*  --   --   --   --   HGB 14.1  < > 12.2* 12.8* 13.5  HCT 41.5  < > 36.5* 39.1 39.9  MCV 90.0  < > 89.7 92.4 90.3  PLT 164  < > 164 185 207  < > = values in this interval not displayed. Cardiac Enzymes:  Recent Labs  06/10/14 0605  CKTOTAL 860*   BNP: No components found with this basename: POCBNP,  CBG:  Recent Labs  06/13/14 1140  GLUCAP 122*    Imaging and Procedures: Echo 8/28:  Nl EF and diastolic function  Assessment/Plan No problem-specific assessment & plan notes found for this encounter.   Functional status:  Family/ staff Communication:   Labs/tests ordered:    This encounter was created in error - please disregard.

## 2014-06-18 LAB — CULTURE, BLOOD (ROUTINE X 2)
Culture: NO GROWTH
Culture: NO GROWTH

## 2014-06-23 ENCOUNTER — Encounter: Payer: Self-pay | Admitting: Internal Medicine

## 2014-06-23 ENCOUNTER — Non-Acute Institutional Stay (SKILLED_NURSING_FACILITY): Payer: Medicare Other | Admitting: Internal Medicine

## 2014-06-23 DIAGNOSIS — R5383 Other fatigue: Secondary | ICD-10-CM | POA: Diagnosis not present

## 2014-06-23 DIAGNOSIS — N390 Urinary tract infection, site not specified: Secondary | ICD-10-CM

## 2014-06-23 DIAGNOSIS — I509 Heart failure, unspecified: Secondary | ICD-10-CM

## 2014-06-23 DIAGNOSIS — R5381 Other malaise: Secondary | ICD-10-CM

## 2014-06-23 DIAGNOSIS — C61 Malignant neoplasm of prostate: Secondary | ICD-10-CM | POA: Insufficient documentation

## 2014-06-23 DIAGNOSIS — I5032 Chronic diastolic (congestive) heart failure: Secondary | ICD-10-CM

## 2014-06-23 DIAGNOSIS — R531 Weakness: Secondary | ICD-10-CM | POA: Insufficient documentation

## 2014-06-23 DIAGNOSIS — Z452 Encounter for adjustment and management of vascular access device: Secondary | ICD-10-CM | POA: Insufficient documentation

## 2014-06-23 DIAGNOSIS — I10 Essential (primary) hypertension: Secondary | ICD-10-CM | POA: Diagnosis not present

## 2014-06-23 DIAGNOSIS — A419 Sepsis, unspecified organism: Secondary | ICD-10-CM | POA: Diagnosis not present

## 2014-06-23 DIAGNOSIS — Z9359 Other cystostomy status: Secondary | ICD-10-CM

## 2014-06-23 HISTORY — DX: Weakness: R53.1

## 2014-06-23 HISTORY — DX: Chronic diastolic (congestive) heart failure: I50.32

## 2014-06-23 NOTE — Assessment & Plan Note (Addendum)
Pt is feeling stronger. He is very concerned about his PICC line and his abx when he goes home.He is on 4-6 weeks of cefazlin for Staph bacteremia TID. Assurances were given that he would not be sent home while on a TID abx and that [facility] would be seeing him before he was discharged. Blood cultures +staph aureus from8/26  -Blood cultures 8/28 show no growth to date  -Echocardiogram: EF 55-60%  Infectious disease, Dr. Tommy Medal,  recommended obtaining a lumbar MRI to rule out abscess, as well as discontinuing vancomycin. Recommends 4-6 weeks of IV antibiotics.  -MRI lumbar spine: No evidence of discitis/osteomyelitis or epidural abscess  -Patient will need to follow up with Dr. Linus Salmons within 2-3 weeks of discharge

## 2014-06-23 NOTE — Assessment & Plan Note (Signed)
Currently compensated  -Echocardiogram in January 2014 showed grade 2 diastolic dysfunction, EF of 50-55%  -Echocardiogram 06/12/2014: EF 74-25%, diastolic function parameters were normal  -Continue to monitor intake and output as well as daily weights  -Continue lasix

## 2014-06-23 NOTE — Assessment & Plan Note (Signed)
Controlled on lasix  

## 2014-06-23 NOTE — Assessment & Plan Note (Signed)
Resulting in suprapubic catheter-urology has rec continue catheter and will discuss further on f/u in 2-3 weeks

## 2014-06-23 NOTE — Progress Notes (Signed)
MRN: [id number] Name: Jerry [name]  Sex: male Age: 68 y.o. DOB: 05-09-1946  [place] #: Karren Burly Facility/Room: 101 Level Of Care: SNF Provider: Inocencio Homes D Emergency Contacts: Extended Emergency Contact Information Primary Emergency Contact: Wylie Hail [address] [place] Phone: [phone] Relation: Friend   Allergies: Review of patient's allergies indicates no known allergies.  Chief Complaint  Patient presents with  . Hospitalization Follow-up    HPI: Patient is 68 y.o. male who was hospitalized for urosepsis and who has 4-6 weeks of IV abx admitted to SNF for nursing support and OT/PT.  Past Medical History  Diagnosis Date  . Malaria   . Cervical myelopathy   . Coma 1968    "for 2 wks"  . BPZWCHEN(277.8)     "monthly" (06/10/2014)  . Arthritis     "hands, knees, back, legs" (06/10/2014)  . PTSD (post-traumatic stress disorder)     "I'm all over that now"  . Prostate cancer     Past Surgical History  Procedure Laterality Date  . Back surgery  2008    cervical and thoracic decompression for myelopathy  . Prostate surgery  03/2012    "shaved it down; didn't take it off"  . Anterior cervical decomp/discectomy fusion  05/2007    Archie Endo 02/16/2011  . Thoracic laminectomy  05/2007    Archie Endo 02/16/2011  . Shrapnel removal  1968    "HEAD, CHEST, LEFT HAND"      Medication List       This list is accurate as of: 06/23/14  8:19 PM.  Always use your most recent med list.               acetaminophen 325 MG tablet  Commonly known as:  TYLENOL  Take 2 tablets (650 mg total) by mouth every 6 (six) hours as needed for mild pain (or Fever >/= 101).     aspirin 325 MG tablet  Take 325 mg by mouth daily.     CALCIUM 1000 + D PO  Take 1 tablet by mouth daily.     ceFAZolin 2-3 GM-% Solr  Commonly known as:  ANCEF  Inject 2 g into the vein every 8 (eight) hours. (4-6 wks per d/c summary)     docusate sodium 100 MG capsule   Commonly known as:  COLACE  Take 100 mg by mouth 2 (two) times daily as needed for mild constipation.     feeding supplement (GLUCERNA SHAKE) Liqd  Take 237 mLs by mouth 2 (two) times daily between meals.     furosemide 20 MG tablet  Commonly known as:  LASIX  Take 20 mg by mouth daily.     heparin lock flush 100 UNIT/ML Soln injection  2.5 mLs (250 Units total) by Intracatheter route daily.     heparin lock flush 100 UNIT/ML Soln injection  2.5 mLs (250 Units total) by Intracatheter route as needed (line care).     meloxicam 7.5 MG tablet  Commonly known as:  MOBIC  Take 7.5 mg by mouth daily.     MULTIVITAMIN PO  Take 1 tablet by mouth daily.     VITAMIN B-12 PO  Take 2 tablets by mouth daily.     vitamin E 1000 UNIT capsule  Take 1,000 Units by mouth 2 (two) times daily.     ZINC PO  Take 1 tablet by mouth daily.     zolpidem 5 MG tablet  Commonly  known as:  AMBIEN  Take 1 tablet (5 mg total) by mouth at bedtime as needed for sleep.        No orders of the defined types were placed in this encounter.    Immunization History  Administered Date(s) Administered  . Influenza Split 10/25/2012  . Pneumococcal Polysaccharide-23 10/25/2012    History  Substance Use Topics  . Smoking status: Never Smoker   . Smokeless tobacco: Never Used     Comment: "smoked cigarettes  for ~ 1 wk in [place]"  . Alcohol Use: No    Review of Systems  DATA OBTAINED: from patient GENERAL: Feels well no fevers, fatigue better, appetite better SKIN: No itching, rash HEENT: No complaint RESPIRATORY: No cough, wheezing, SOB CARDIAC: No chest pain, palpitations, lower extremity edema  GI: No abdominal pain, No N/V/D or constipation, No heartburn or reflux  GU: No dysuria, frequency or urgency, or incontinence  MUSCULOSKELETAL: No unrelieved bone/joint pain NEUROLOGIC: No headache, dizziness  PSYCHIATRIC: No overt anxiety or sadness. Sleeps well.   Filed Vitals:   06/23/14  1941  BP: 116/62  Pulse: 67  Temp: 97.9 F (36.6 C)  Resp: 16    Physical Exam  GENERAL APPEARANCE: Alert, conversant. Appropriately groomed. No acute distress  SKIN: No diaphoresis rash, or wounds HEENT: Unremarkable RESPIRATORY: Breathing is even, unlabored. Lung sounds are clear   CARDIOVASCULAR: Heart RRR no murmurs, rubs or gallops. No peripheral edema ; PICC R arm GASTROINTESTINAL: Abdomen is soft, non-tender, not distended w/ normal bowel sounds.  GENITOURINARY: Bladder non tender, not distended ;suprapubic cath MUSCULOSKELETAL: No abnormal joints or musculature NEUROLOGIC: Cranial nerves 2-12 grossly intact. Moves all extremities no tremor. PSYCHIATRIC: Mood and affect appropriate to situation, no behavioral issues  Patient Active Problem List   Diagnosis Date Noted  . Generalized weakness 06/23/2014  . Chronic diastolic congestive heart failure 06/23/2014  . HTN (hypertension) 06/23/2014  . Prostate cancer 06/23/2014  . PICC (peripherally inserted central catheter) in place 06/23/2014  . Presence of suprapubic catheter 06/17/2014  . Cervical myelopathy   . Arthritis   . Sepsis 06/10/2014  . Sepsis secondary to UTI 06/10/2014  . Urinary incontinence, functional 10/25/2012  . Bilateral leg weakness 10/25/2012  . Fall at home 10/25/2012  . UTI (lower urinary tract infection) 10/25/2012  . Sleep apnea 10/25/2012  . Medically noncompliant 10/25/2012  . Obesity 10/25/2012    CBC    Component Value Date/Time   WBC 8.0 06/15/2014 0731   RBC 4.42 06/15/2014 0731   HGB 13.5 06/15/2014 0731   HCT 39.9 06/15/2014 0731   PLT 207 06/15/2014 0731   MCV 90.3 06/15/2014 0731   LYMPHSABS 0.6* 06/10/2014 0605   MONOABS 1.4* 06/10/2014 0605   EOSABS 0.0 06/10/2014 0605   BASOSABS 0.0 06/10/2014 0605    CMP     Component Value Date/Time   NA 139 06/15/2014 0731   K 4.2 06/15/2014 0731   CL 98 06/15/2014 0731   CO2 31 06/15/2014 0731   GLUCOSE 104* 06/15/2014 0731   BUN 14  06/15/2014 0731   CREATININE 0.97 06/15/2014 0731   CALCIUM 9.0 06/15/2014 0731   PROT 7.6 06/10/2014 0605   ALBUMIN 3.2* 06/10/2014 0605   AST 33 06/10/2014 0605   ALT 19 06/10/2014 0605   ALKPHOS 63 06/10/2014 0605   BILITOT 1.0 06/10/2014 0605   GFRNONAA 82* 06/15/2014 0731   GFRAA >90 06/15/2014 0731    Assessment and Plan  Sepsis secondary to UTI Pt is  feeling stronger. He is very concerned about his PICC line and his abx when he goes home.He is on 4-6 weeks of cefazlin for Staph bacteremia TID. Assurances were given that he would not be sent home while on a TID abx and that [facility] would be seeing him before he was discharged. Blood cultures +staph aureus from8/26  -Blood cultures 8/28 show no growth to date  -Echocardiogram: EF 55-60%  Infectious disease, Dr. Tommy Medal,  recommended obtaining a lumbar MRI to rule out abscess, as well as discontinuing vancomycin. Recommends 4-6 weeks of IV antibiotics.  -MRI lumbar spine: No evidence of discitis/osteomyelitis or epidural abscess  -Patient will need to follow up with Dr. Linus Salmons within 2-3 weeks of discharge    Generalized weakness Likely secondary to urinary tract infection  -CK 860  -TSH 2.5    Chronic diastolic congestive heart failure Currently compensated  -Echocardiogram in January 2014 showed grade 2 diastolic dysfunction, EF of 50-55%  -Echocardiogram 06/12/2014: EF 00-51%, diastolic function parameters were normal  -Continue to monitor intake and output as well as daily weights  -Continue lasix   HTN (hypertension) Controlled on lasix  Prostate cancer Resulting in suprapubic catheter-urology has rec continue catheter and will discuss further on f/u in 2-3 weeks    Hennie Duos, MD

## 2014-06-23 NOTE — Assessment & Plan Note (Signed)
Likely secondary to urinary tract infection  -CK 860  -TSH 2.5

## 2014-06-30 ENCOUNTER — Ambulatory Visit (INDEPENDENT_AMBULATORY_CARE_PROVIDER_SITE_OTHER): Payer: Medicare Other | Admitting: Internal Medicine

## 2014-06-30 ENCOUNTER — Encounter: Payer: Self-pay | Admitting: Internal Medicine

## 2014-06-30 VITALS — BP 99/63 | HR 69 | Temp 98.1°F | Ht 77.5 in | Wt 302.0 lb

## 2014-06-30 DIAGNOSIS — R7881 Bacteremia: Secondary | ICD-10-CM

## 2014-06-30 DIAGNOSIS — B9561 Methicillin susceptible Staphylococcus aureus infection as the cause of diseases classified elsewhere: Secondary | ICD-10-CM | POA: Insufficient documentation

## 2014-06-30 DIAGNOSIS — A4901 Methicillin susceptible Staphylococcus aureus infection, unspecified site: Secondary | ICD-10-CM

## 2014-06-30 DIAGNOSIS — Z23 Encounter for immunization: Secondary | ICD-10-CM

## 2014-06-30 HISTORY — DX: Bacteremia: R78.81

## 2014-06-30 NOTE — Progress Notes (Signed)
   Subjective:    Patient ID: Corbin Falck, male    DOB: 10/21/1945, 68 y.o.   MRN: [id number]  HPI Here for follow up of MSSA bacteremia.  History of suprapubic catheter, prostate cancer, presented with myalgias, leukocytosis and grew 2/2 MSSA blood cultures.  Repeat blood cultures negative.  Refused TEE.  On Ancef through Oct 8th (6 weeks).  Tolerating medicine well.  No diarrhea, no rash.  Infusing well.  No issues with picc.    Review of Systems  Constitutional: Negative for fever, chills and fatigue.  HENT: Negative for trouble swallowing.   Gastrointestinal: Negative for nausea and diarrhea.  Skin: Negative for rash.  Neurological: Negative for dizziness and light-headedness.       Objective:   Physical Exam  Constitutional: He appears well-developed and well-nourished. No distress.  HENT:  Mouth/Throat: No oropharyngeal exudate.  Eyes: No scleral icterus.  Cardiovascular: Normal rate, regular rhythm and normal heart sounds.   No murmur heard. Pulmonary/Chest: Effort normal and breath sounds normal. No respiratory distress. He has no wheezes.  Skin: No rash noted.          Assessment & Plan:

## 2014-06-30 NOTE — Assessment & Plan Note (Signed)
Doing well on current therapy.  Continue through Oct 8 and will follow up then, likely stop.

## 2014-07-04 DIAGNOSIS — Z452 Encounter for adjustment and management of vascular access device: Secondary | ICD-10-CM | POA: Diagnosis not present

## 2014-07-16 ENCOUNTER — Telehealth: Payer: Self-pay | Admitting: *Deleted

## 2014-07-16 DIAGNOSIS — I509 Heart failure, unspecified: Secondary | ICD-10-CM | POA: Diagnosis not present

## 2014-07-16 DIAGNOSIS — Z8546 Personal history of malignant neoplasm of prostate: Secondary | ICD-10-CM | POA: Diagnosis not present

## 2014-07-16 DIAGNOSIS — I10 Essential (primary) hypertension: Secondary | ICD-10-CM | POA: Diagnosis not present

## 2014-07-16 DIAGNOSIS — B9561 Methicillin susceptible Staphylococcus aureus infection as the cause of diseases classified elsewhere: Secondary | ICD-10-CM | POA: Diagnosis not present

## 2014-07-16 DIAGNOSIS — R7881 Bacteremia: Secondary | ICD-10-CM | POA: Diagnosis not present

## 2014-07-16 DIAGNOSIS — N400 Enlarged prostate without lower urinary tract symptoms: Secondary | ICD-10-CM | POA: Diagnosis not present

## 2014-07-16 DIAGNOSIS — I5032 Chronic diastolic (congestive) heart failure: Secondary | ICD-10-CM | POA: Diagnosis not present

## 2014-07-16 DIAGNOSIS — A419 Sepsis, unspecified organism: Secondary | ICD-10-CM | POA: Diagnosis not present

## 2014-07-16 DIAGNOSIS — C61 Malignant neoplasm of prostate: Secondary | ICD-10-CM | POA: Diagnosis not present

## 2014-07-16 NOTE — Telephone Encounter (Signed)
No labs needed  thanks

## 2014-07-16 NOTE — Telephone Encounter (Signed)
Call from skilled nursing facility regarding end date for cefazolin. Advised per Dr. Linus Salmons to continue through 07/23/14 when patient has a follow up appointment. Does patient need any labs drawn prior to appt.

## 2014-07-16 NOTE — Telephone Encounter (Signed)
Nursing notified.

## 2014-07-23 ENCOUNTER — Ambulatory Visit: Payer: Medicare Other | Admitting: Internal Medicine

## 2014-07-23 ENCOUNTER — Telehealth: Payer: Self-pay | Admitting: *Deleted

## 2014-07-23 NOTE — Telephone Encounter (Signed)
Took call from [place] at [facility].  They were unable to get patient here for his follow up appointment.  Per Dr. Linus Salmons, ok for facility to pull PICC today.  Patient does not need labs or oral antibiotics.  Patient will follow up with Dr. Linus Salmons 10/27 at 10:45.  Brandy confirmed verbal orders. Landis Gandy, RN

## 2014-07-27 ENCOUNTER — Non-Acute Institutional Stay (SKILLED_NURSING_FACILITY): Payer: Medicare Other | Admitting: Nurse Practitioner

## 2014-07-27 DIAGNOSIS — I5032 Chronic diastolic (congestive) heart failure: Secondary | ICD-10-CM | POA: Diagnosis not present

## 2014-07-27 DIAGNOSIS — N390 Urinary tract infection, site not specified: Secondary | ICD-10-CM

## 2014-07-27 DIAGNOSIS — A419 Sepsis, unspecified organism: Secondary | ICD-10-CM

## 2014-07-27 DIAGNOSIS — C61 Malignant neoplasm of prostate: Secondary | ICD-10-CM

## 2014-07-27 DIAGNOSIS — R531 Weakness: Secondary | ICD-10-CM

## 2014-07-27 DIAGNOSIS — I10 Essential (primary) hypertension: Secondary | ICD-10-CM | POA: Diagnosis not present

## 2014-07-27 DIAGNOSIS — M1990 Unspecified osteoarthritis, unspecified site: Secondary | ICD-10-CM

## 2014-07-27 NOTE — Progress Notes (Signed)
Patient ID: Jerry [name], male   DOB: [birth year], 68 y.o.   MRN: [id number]    Nursing Home Location:  [place] of Service: SNF (31)  PCP: Cammy Copa, MD  No Known Allergies  Chief Complaint  Patient presents with  . Discharge Note    HPI:  68 y.o. male with h/o cervical myelopathy, htn, prostate cancer s/p suprapubic catheter placement was admitted here for short term rehab s/p hospitalization 8/26-06/16/14 with generalized weakness and a recent fall. Pt was found to have sepsis secondary to a UTI.  He was treated with vanc initially then changed to cefazolin for a 4-6 week course of IV abx which he has now completed  MRI lumbar spine was negative for discitis/osteomyelitis. He was to follow up Dr. Linus Salmons in [place] but missed initial appt, now has follow up appt scheduled for 9/27. PICC has been removed. Patient currently doing well with therapy, now stable to discharge home with home health.    Review of Systems:  Review of Systems  Constitutional: Negative for activity change, appetite change, fatigue and unexpected weight change.  HENT: Negative for congestion.   Respiratory: Negative for shortness of breath and wheezing.   Cardiovascular: Negative for chest pain, palpitations and leg swelling.  Gastrointestinal: Negative for nausea, abdominal pain, diarrhea, constipation and abdominal distention.  Genitourinary:       Suprapubic cath  Musculoskeletal: Positive for arthralgias.  Skin: Positive for color change (to LE -chronic).  Neurological: Negative for dizziness and tremors.  Psychiatric/Behavioral: Negative for confusion and agitation.    Past Medical History  Diagnosis Date  . Malaria   . Cervical myelopathy   . Coma 1968    "for 2 wks"  . ATFTDDUK(025.4)     "monthly" (06/10/2014)  . Arthritis     "hands, knees, back, legs" (06/10/2014)  . PTSD (post-traumatic stress disorder)     "I'm all over that now"  . Prostate cancer     Past Surgical History  Procedure Laterality Date  . Back surgery  2008    cervical and thoracic decompression for myelopathy  . Prostate surgery  03/2012    "shaved it down; didn't take it off"  . Anterior cervical decomp/discectomy fusion  05/2007    Archie Endo 02/16/2011  . Thoracic laminectomy  05/2007    Archie Endo 02/16/2011  . Shrapnel removal  1968    "HEAD, CHEST, LEFT HAND"   Social History:   reports that he has never smoked. He has never used smokeless tobacco. He reports that he does not drink alcohol or use illicit drugs.  Family History  Problem Relation Age of Onset  . Heart disease Mother   . Heart disease Father   . Dementia Mother   . Dementia Father     Medications: Patient's Medications  New Prescriptions   No medications on file  Previous Medications   ACETAMINOPHEN (TYLENOL) 325 MG TABLET    Take 2 tablets (650 mg total) by mouth every 6 (six) hours as needed for mild pain (or Fever >/= 101).   ASPIRIN 325 MG TABLET    Take 325 mg by mouth daily.   CALCIUM CARB-CHOLECALCIFEROL (CALCIUM 1000 + D PO)    Take 1 tablet by mouth daily.   CYANOCOBALAMIN (VITAMIN B-12 PO)    Take 2 tablets by mouth daily.   DOCUSATE SODIUM (COLACE) 100 MG CAPSULE    Take 100 mg by mouth 2 (two) times daily as needed for mild constipation.  FEEDING SUPPLEMENT, GLUCERNA SHAKE, (GLUCERNA SHAKE) LIQD    Take 237 mLs by mouth 2 (two) times daily between meals.   FUROSEMIDE (LASIX) 20 MG TABLET    Take 20 mg by mouth daily.   MELOXICAM (MOBIC) 7.5 MG TABLET    Take 7.5 mg by mouth daily.   MULTIPLE VITAMINS-MINERALS (MULTIVITAMIN PO)    Take 1 tablet by mouth daily.   MULTIPLE VITAMINS-MINERALS (ZINC PO)    Take 1 tablet by mouth daily.   VITAMIN E 1000 UNIT CAPSULE    Take 1,000 Units by mouth 2 (two) times daily.   ZOLPIDEM (AMBIEN) 5 MG TABLET    Take 1 tablet (5 mg total) by mouth at bedtime as needed for sleep.  Modified Medications   No medications on file  Discontinued Medications    CEFAZOLIN (ANCEF) 2-3 GM-% SOLR    Inject 2 g into the vein every 8 (eight) hours. (4-6 wks per d/c summary)   HEPARIN LOCK FLUSH 100 UNIT/ML SOLN INJECTION    2.5 mLs (250 Units total) by Intracatheter route daily.   HEPARIN LOCK FLUSH 100 UNIT/ML SOLN INJECTION    2.5 mLs (250 Units total) by Intracatheter route as needed (line care).     Physical Exam: Filed Vitals:   07/27/14 1203  BP: 113/73  Pulse: 68  Temp: 98.6 F (37 C)  Resp: 10  SpO2: 95%    Physical Exam  Constitutional: He is oriented to person, place, and time. He appears well-developed and well-nourished. No distress.  HENT:  Mouth/Throat: Oropharynx is clear and moist. No oropharyngeal exudate.  Eyes: Conjunctivae and EOM are normal. Pupils are equal, round, and reactive to light.  Neck: Normal range of motion. Neck supple.  Cardiovascular: Normal rate, regular rhythm and normal heart sounds.   Pulmonary/Chest: Effort normal and breath sounds normal.  Abdominal: Soft. Bowel sounds are normal.  Musculoskeletal: He exhibits no edema and no tenderness.  Neurological: He is alert and oriented to person, place, and time.  Skin: Skin is warm and dry. He is not diaphoretic.  Psychiatric: He has a normal mood and affect.    Labs reviewed: Basic Metabolic Panel:  Recent Labs  06/13/14 0513 06/14/14 0427 06/15/14 0731  NA 141 138 139  K 3.6* 4.2 4.2  CL 102 99 98  CO2 28 31 31   GLUCOSE 120* 119* 104*  BUN 12 12 14   CREATININE 0.86 1.00 0.97  CALCIUM 9.0 9.4 9.0   Liver Function Tests:  Recent Labs  06/10/14 0605  AST 33  ALT 19  ALKPHOS 63  BILITOT 1.0  PROT 7.6  ALBUMIN 3.2*   No results found for this basename: LIPASE, AMYLASE,  in the last 8760 hours No results found for this basename: AMMONIA,  in the last 8760 hours CBC:  Recent Labs  06/10/14 0605  06/13/14 0513 06/14/14 0427 06/15/14 0731  WBC 13.5*  < > 7.3 8.0 8.0  NEUTROABS 11.4*  --   --   --   --   HGB 14.1  < > 12.2* 12.8*  13.5  HCT 41.5  < > 36.5* 39.1 39.9  MCV 90.0  < > 89.7 92.4 90.3  PLT 164  < > 164 185 207  < > = values in this interval not displayed. TSH:  Recent Labs  06/10/14 1525  TSH 2.500    Assessment/Plan 1. Sepsis secondary to UTI -completed 4 weeks of ancef, PICC has been pulled, missed original follow up with ID but this  has been rescheduled for the 27th of this month.   2. Chronic diastolic congestive heart failure controlled with lasix   3. Essential hypertension Controlled on lasix only   4. Prostate cancer Resulting in suprapubic cath, has urologist in [place], conts to follow up with them on discharge  5. Arthritis conts on mobic  6. Generalized weakness Weakness after hospitalization for sepsis due to UTI but pt is now stable and ready for discharge-will need PT/OTper home health. No DME needed. Rx written.  will need to follow up with PCP within 2 weeks.

## 2014-08-11 ENCOUNTER — Encounter: Payer: Self-pay | Admitting: Internal Medicine

## 2014-08-11 ENCOUNTER — Ambulatory Visit (INDEPENDENT_AMBULATORY_CARE_PROVIDER_SITE_OTHER): Payer: Medicare Other | Admitting: Internal Medicine

## 2014-08-11 ENCOUNTER — Encounter: Payer: Self-pay | Admitting: Nurse Practitioner

## 2014-08-11 VITALS — BP 122/74 | HR 71 | Temp 97.5°F | Wt 307.0 lb

## 2014-08-11 DIAGNOSIS — B9561 Methicillin susceptible Staphylococcus aureus infection as the cause of diseases classified elsewhere: Secondary | ICD-10-CM

## 2014-08-11 DIAGNOSIS — R7881 Bacteremia: Secondary | ICD-10-CM

## 2014-08-11 NOTE — Progress Notes (Signed)
   Subjective:    Patient ID: Jerry [name], male    DOB: Jan 26, 1946, 68 y.o.   MRN: [id number]  HPI Here for follow up of MSSA bacteremia.  History of suprapubic catheter, prostate cancer, presented with myalgias, leukocytosis and grew 2/2 MSSA blood cultures.  Repeat blood cultures negative.  Refused TEE.  On Ancef through Oct 8th (6 weeks) and now finished.  Tolerated medicine well.  No diarrhea, no rash.  Picc removed.     Review of Systems  Constitutional: Negative for fever, chills and fatigue.  HENT: Negative for trouble swallowing.   Gastrointestinal: Negative for nausea and diarrhea.  Skin: Negative for rash.  Neurological: Negative for dizziness and light-headedness.       Objective:   Physical Exam  Constitutional: He appears well-developed and well-nourished. No distress.  HENT:  Mouth/Throat: No oropharyngeal exudate.  Eyes: No scleral icterus.  Cardiovascular: Normal rate, regular rhythm and normal heart sounds.   No murmur heard. Pulmonary/Chest: Effort normal and breath sounds normal. No respiratory distress. He has no wheezes.  Skin: No rash noted.          Assessment & Plan:

## 2014-08-11 NOTE — Assessment & Plan Note (Signed)
Doing great now 3 weeks after completing therapy.  No issues.  RTC PRN.

## 2014-08-15 DIAGNOSIS — R339 Retention of urine, unspecified: Secondary | ICD-10-CM | POA: Insufficient documentation

## 2014-08-18 ENCOUNTER — Other Ambulatory Visit: Payer: Self-pay | Admitting: Internal Medicine

## 2014-08-29 ENCOUNTER — Emergency Department (HOSPITAL_COMMUNITY)
Admission: EM | Admit: 2014-08-29 | Discharge: 2014-08-29 | Disposition: A | Discharge status: Home / Self Care | Payer: Medicare Other | Attending: Emergency Medicine | Admitting: Emergency Medicine

## 2014-08-29 ENCOUNTER — Encounter (HOSPITAL_COMMUNITY): Payer: Self-pay | Admitting: Emergency Medicine

## 2014-08-29 DIAGNOSIS — Z8659 Personal history of other mental and behavioral disorders: Secondary | ICD-10-CM | POA: Diagnosis not present

## 2014-08-29 DIAGNOSIS — Z8669 Personal history of other diseases of the nervous system and sense organs: Secondary | ICD-10-CM | POA: Insufficient documentation

## 2014-08-29 DIAGNOSIS — Y846 Urinary catheterization as the cause of abnormal reaction of the patient, or of later complication, without mention of misadventure at the time of the procedure: Secondary | ICD-10-CM | POA: Diagnosis not present

## 2014-08-29 DIAGNOSIS — R339 Retention of urine, unspecified: Secondary | ICD-10-CM | POA: Diagnosis present

## 2014-08-29 DIAGNOSIS — T83511A Infection and inflammatory reaction due to indwelling urethral catheter, initial encounter: Secondary | ICD-10-CM

## 2014-08-29 DIAGNOSIS — Z8546 Personal history of malignant neoplasm of prostate: Secondary | ICD-10-CM | POA: Diagnosis not present

## 2014-08-29 DIAGNOSIS — T83198A Other mechanical complication of other urinary devices and implants, initial encounter: Secondary | ICD-10-CM | POA: Diagnosis not present

## 2014-08-29 DIAGNOSIS — Z79899 Other long term (current) drug therapy: Secondary | ICD-10-CM | POA: Diagnosis not present

## 2014-08-29 DIAGNOSIS — Z8619 Personal history of other infectious and parasitic diseases: Secondary | ICD-10-CM | POA: Insufficient documentation

## 2014-08-29 DIAGNOSIS — N390 Urinary tract infection, site not specified: Secondary | ICD-10-CM | POA: Diagnosis not present

## 2014-08-29 DIAGNOSIS — M1990 Unspecified osteoarthritis, unspecified site: Secondary | ICD-10-CM | POA: Insufficient documentation

## 2014-08-29 DIAGNOSIS — Z7982 Long term (current) use of aspirin: Secondary | ICD-10-CM | POA: Insufficient documentation

## 2014-08-29 DIAGNOSIS — T83010A Breakdown (mechanical) of cystostomy catheter, initial encounter: Secondary | ICD-10-CM

## 2014-08-29 DIAGNOSIS — T83098A Other mechanical complication of other indwelling urethral catheter, initial encounter: Secondary | ICD-10-CM | POA: Diagnosis not present

## 2014-08-29 DIAGNOSIS — Z791 Long term (current) use of non-steroidal anti-inflammatories (NSAID): Secondary | ICD-10-CM | POA: Insufficient documentation

## 2014-08-29 DIAGNOSIS — B9689 Other specified bacterial agents as the cause of diseases classified elsewhere: Secondary | ICD-10-CM | POA: Diagnosis not present

## 2014-08-29 LAB — URINALYSIS, ROUTINE W REFLEX MICROSCOPIC
BILIRUBIN URINE: NEGATIVE
Glucose, UA: NEGATIVE mg/dL
KETONES UR: NEGATIVE mg/dL
Nitrite: POSITIVE — AB
PROTEIN: 30 mg/dL — AB
Specific Gravity, Urine: 1.016 (ref 1.005–1.030)
UROBILINOGEN UA: 0.2 mg/dL (ref 0.0–1.0)
pH: 8 (ref 5.0–8.0)

## 2014-08-29 LAB — URINE MICROSCOPIC-ADD ON

## 2014-08-29 MED ORDER — CEPHALEXIN 500 MG PO CAPS: 500.0000 mg | ORAL_CAPSULE | Freq: Three times a day (TID) | ORAL | Status: DC

## 2014-08-29 MED ORDER — LIDOCAINE HCL 1 % IJ SOLN
INTRAMUSCULAR | Status: AC
  Administered 2014-08-29: 2 mL
  Filled 2014-08-29: qty 20

## 2014-08-29 MED ORDER — CEFTRIAXONE SODIUM 1 G IJ SOLR
1.0000 g | Freq: Once | INTRAMUSCULAR | Status: AC
  Administered 2014-08-29: 1 g via INTRAMUSCULAR
  Filled 2014-08-29: qty 10

## 2014-08-29 NOTE — ED Provider Notes (Signed)
CSN: [id number]     Arrival date & time 08/29/14  1921 History   First MD Initiated Contact with Patient 08/29/14 2020     Chief Complaint  Patient presents with  . Urinary Retention      HPI Patient presents to the emergency department complaining of suprapubic catheter dysfunction.  He states it has not drained and 4 hours.  He feels abdominal distention and urinary retention.  He does report new sediment in urine over the past 24 hours.  Denies fevers and chills.  No back pain or flank pain.  Denies nausea vomiting.  Catheter was last exchanged about 3 weeks ago in his urology office.  He's had his suprapubic catheter for a year and a half.  He attempted to  Flush his catheter home without success.   Past Medical History  Diagnosis Date  . Malaria   . Cervical myelopathy   . Coma 1968    "for 2 wks"  . TDSKAJGO(115.7)     "monthly" (06/10/2014)  . Arthritis     "hands, knees, back, legs" (06/10/2014)  . PTSD (post-traumatic stress disorder)     "I'm all over that now"  . Prostate cancer    Past Surgical History  Procedure Laterality Date  . Back surgery  2008    cervical and thoracic decompression for myelopathy  . Prostate surgery  03/2012    "shaved it down; didn't take it off"  . Anterior cervical decomp/discectomy fusion  05/2007    Archie Endo 02/16/2011  . Thoracic laminectomy  05/2007    Archie Endo 02/16/2011  . Shrapnel removal  1968    "HEAD, CHEST, LEFT HAND"   Family History  Problem Relation Age of Onset  . Heart disease Mother   . Heart disease Father   . Dementia Mother   . Dementia Father    History  Substance Use Topics  . Smoking status: Never Smoker   . Smokeless tobacco: Never Used     Comment: "smoked cigarettes  for ~ 1 wk in [place]"  . Alcohol Use: No    Review of Systems  All other systems reviewed and are negative.     Allergies  Review of patient's allergies indicates no known allergies.  Home Medications   Prior to Admission medications    Medication Sig Start Date End Date Taking? Authorizing Provider  aspirin 325 MG tablet Take 325 mg by mouth daily.   Yes Historical Provider, MD  Calcium Carb-Cholecalciferol (CALCIUM 1000 + D PO) Take 1 tablet by mouth daily.   Yes Historical Provider, MD  Cyanocobalamin (VITAMIN B-12 PO) Take 2 tablets by mouth daily.   Yes Historical Provider, MD  docusate sodium (COLACE) 100 MG capsule Take 100 mg by mouth 2 (two) times daily as needed for mild constipation.   Yes Historical Provider, MD  furosemide (LASIX) 20 MG tablet Take 20 mg by mouth daily. 05/18/14  Yes Historical Provider, MD  meloxicam (MOBIC) 7.5 MG tablet Take 7.5 mg by mouth daily. 04/11/14  Yes Historical Provider, MD  Multiple Vitamins-Minerals (MULTIVITAMIN PO) Take 1 tablet by mouth daily.   Yes Historical Provider, MD  Polyvinyl Alcohol-Povidone (REFRESH OP) Place 1 drop into both eyes daily as needed.   Yes Historical Provider, MD  tamsulosin (FLOMAX) 0.4 MG CAPS capsule Take 0.4 mg by mouth daily after breakfast.   Yes Historical Provider, MD  vitamin E 1000 UNIT capsule Take 1,000 Units by mouth 2 (two) times daily.   Yes Historical Provider, MD  zinc sulfate 220 MG capsule Take 220 mg by mouth daily.   Yes Historical Provider, MD  cephALEXin (KEFLEX) 500 MG capsule Take 1 capsule (500 mg total) by mouth 3 (three) times daily. 08/29/14   Hoy Morn, MD  levofloxacin (LEVAQUIN) 500 MG tablet Take 500 mg by mouth daily. For UTI    Historical Provider, MD   BP 144/76 mmHg  Pulse 79  Temp(Src) 98.8 F (37.1 C) (Oral)  Resp 19  SpO2 96% Physical Exam  Constitutional: He is oriented to person, place, and time. He appears well-developed and well-nourished.  HENT:  Head: Normocephalic and atraumatic.  Eyes: EOM are normal.  Neck: Normal range of motion.  Cardiovascular: Normal rate, regular rhythm, normal heart sounds and intact distal pulses.   Pulmonary/Chest: Effort normal and breath sounds normal. No respiratory  distress.  Abdominal: Soft. He exhibits no distension. There is no tenderness.  Nonfunctioning suprapubic catheter in place  Musculoskeletal: Normal range of motion.  Neurological: He is alert and oriented to person, place, and time.  Skin: Skin is warm and dry.  Psychiatric: He has a normal mood and affect. Judgment normal.  Nursing note and vitals reviewed.   ED Course  SUPRAPUBIC TUBE PLACEMENT Date/Time: 08/29/2014 11:49 PM Performed by: Hoy Morn Authorized by: Hoy Morn Consent: Verbal consent obtained. Consent given by: patient Required items: required blood products, implants, devices, and special equipment available Patient identity confirmed: verbally with patient Indications: urinary retention Suprapubic aspiration by: catheter Catheter type: Foley Catheter size: 22 Fr Urine characteristics: mildly cloudy Patient tolerance: Patient tolerated the procedure well with no immediate complications Comments: Suprapubic catheter exchange without difficulty     Labs Review Labs Reviewed  URINALYSIS, ROUTINE W REFLEX MICROSCOPIC - Abnormal; Notable for the following:    APPearance TURBID (*)    Hgb urine dipstick LARGE (*)    Protein, ur 30 (*)    Nitrite POSITIVE (*)    Leukocytes, UA LARGE (*)    All other components within normal limits  URINE MICROSCOPIC-ADD ON - Abnormal; Notable for the following:    Bacteria, UA MANY (*)    All other components within normal limits  URINE CULTURE    Imaging Review No results found.   EKG Interpretation None      MDM   Final diagnoses:  Urinary retention  Suprapubic catheter dysfunction, initial encounter  Urinary tract infection associated with catheterization of urinary tract, initial encounter    Suprapubic catheter was exchanged without any difficulty.  Urine culture sent.  Given the new sediment in the urine the patient recovered for urinary tract infection.  Rocephin in the ER.  Home with  Keflex.    Hoy Morn, MD 08/29/14 [phone]

## 2014-08-29 NOTE — ED Notes (Signed)
MD at bedside. 

## 2014-08-29 NOTE — ED Notes (Signed)
Pt arrived to the ED with a complaint of urinary retention.  Pt has a supapubic catheter.  Pt has had problems previously with blockage.  Pt recently has left the hospital for bacterial infection.  Pt has bladder pain and is uncomfortable.

## 2014-08-29 NOTE — ED Notes (Signed)
Bed: WA08 Expected date:  Expected time:  Means of arrival:  Comments: Triage 4 

## 2014-09-01 LAB — URINE CULTURE: Colony Count: >=100000

## 2014-09-02 ENCOUNTER — Telehealth (HOSPITAL_COMMUNITY): Payer: Self-pay

## 2014-09-02 NOTE — ED Notes (Signed)
Post ED Visit - Positive Culture Follow-up  Culture report reviewed by antimicrobial stewardship pharmacist: []  Wes Dulaney, Pharm.D., BCPS []  Heide Guile, Pharm.D., BCPS []  Alycia Rossetti, Pharm.D., BCPS []  [place], Pharm.D., BCPS, AAHIVP []  Legrand Como, Pharm.D., BCPS, AAHIVP [x]  Elicia Lamp, Pharm.D.   Positive urine culture Treated with cephalexin, organism sensitive to the same and no further patient follow-up is required at this time.  Ileene Musa 09/02/2014, 2:00 PM

## 2014-09-14 DIAGNOSIS — R339 Retention of urine, unspecified: Secondary | ICD-10-CM | POA: Diagnosis not present

## 2014-10-19 DIAGNOSIS — R339 Retention of urine, unspecified: Secondary | ICD-10-CM | POA: Diagnosis not present

## 2014-11-01 ENCOUNTER — Other Ambulatory Visit: Payer: Self-pay | Admitting: Internal Medicine

## 2014-11-02 NOTE — Progress Notes (Signed)
error 

## 2014-11-13 ENCOUNTER — Other Ambulatory Visit: Payer: Self-pay | Admitting: Internal Medicine

## 2014-11-23 DIAGNOSIS — R339 Retention of urine, unspecified: Secondary | ICD-10-CM | POA: Diagnosis not present

## 2014-12-25 DIAGNOSIS — R339 Retention of urine, unspecified: Secondary | ICD-10-CM | POA: Diagnosis not present

## 2015-01-29 DIAGNOSIS — N319 Neuromuscular dysfunction of bladder, unspecified: Secondary | ICD-10-CM | POA: Diagnosis not present

## 2015-02-05 DIAGNOSIS — E782 Mixed hyperlipidemia: Secondary | ICD-10-CM | POA: Diagnosis not present

## 2015-02-05 DIAGNOSIS — E559 Vitamin D deficiency, unspecified: Secondary | ICD-10-CM | POA: Diagnosis not present

## 2015-02-05 DIAGNOSIS — E780 Pure hypercholesterolemia: Secondary | ICD-10-CM | POA: Diagnosis not present

## 2015-02-05 DIAGNOSIS — Z79899 Other long term (current) drug therapy: Secondary | ICD-10-CM | POA: Diagnosis not present

## 2015-02-05 DIAGNOSIS — N390 Urinary tract infection, site not specified: Secondary | ICD-10-CM | POA: Diagnosis not present

## 2015-02-05 DIAGNOSIS — Z6841 Body Mass Index (BMI) 40.0 and over, adult: Secondary | ICD-10-CM | POA: Diagnosis not present

## 2015-03-05 DIAGNOSIS — R338 Other retention of urine: Secondary | ICD-10-CM | POA: Diagnosis not present

## 2015-04-09 DIAGNOSIS — R339 Retention of urine, unspecified: Secondary | ICD-10-CM | POA: Diagnosis not present

## 2015-05-14 DIAGNOSIS — R339 Retention of urine, unspecified: Secondary | ICD-10-CM | POA: Diagnosis not present

## 2015-06-18 DIAGNOSIS — R339 Retention of urine, unspecified: Secondary | ICD-10-CM | POA: Diagnosis not present

## 2015-07-23 DIAGNOSIS — R339 Retention of urine, unspecified: Secondary | ICD-10-CM | POA: Diagnosis not present

## 2015-08-19 DIAGNOSIS — Z23 Encounter for immunization: Secondary | ICD-10-CM | POA: Diagnosis not present

## 2015-08-27 DIAGNOSIS — N390 Urinary tract infection, site not specified: Secondary | ICD-10-CM | POA: Diagnosis not present

## 2015-08-27 DIAGNOSIS — R339 Retention of urine, unspecified: Secondary | ICD-10-CM | POA: Diagnosis not present

## 2015-10-01 DIAGNOSIS — N319 Neuromuscular dysfunction of bladder, unspecified: Secondary | ICD-10-CM | POA: Diagnosis not present

## 2015-10-01 DIAGNOSIS — R339 Retention of urine, unspecified: Secondary | ICD-10-CM | POA: Diagnosis not present

## 2015-10-17 ENCOUNTER — Emergency Department (HOSPITAL_COMMUNITY)
Admission: EM | Admit: 2015-10-17 | Discharge: 2015-10-17 | Disposition: A | Discharge status: Home / Self Care | Payer: Medicare Other | Attending: Emergency Medicine | Admitting: Emergency Medicine

## 2015-10-17 ENCOUNTER — Encounter (HOSPITAL_COMMUNITY): Payer: Self-pay | Admitting: Emergency Medicine

## 2015-10-17 DIAGNOSIS — T83098A Other mechanical complication of other indwelling urethral catheter, initial encounter: Secondary | ICD-10-CM | POA: Diagnosis not present

## 2015-10-17 DIAGNOSIS — T83198A Other mechanical complication of other urinary devices and implants, initial encounter: Secondary | ICD-10-CM | POA: Diagnosis not present

## 2015-10-17 DIAGNOSIS — Z8546 Personal history of malignant neoplasm of prostate: Secondary | ICD-10-CM | POA: Insufficient documentation

## 2015-10-17 DIAGNOSIS — Z791 Long term (current) use of non-steroidal anti-inflammatories (NSAID): Secondary | ICD-10-CM | POA: Insufficient documentation

## 2015-10-17 DIAGNOSIS — M1990 Unspecified osteoarthritis, unspecified site: Secondary | ICD-10-CM | POA: Diagnosis not present

## 2015-10-17 DIAGNOSIS — Z8659 Personal history of other mental and behavioral disorders: Secondary | ICD-10-CM | POA: Insufficient documentation

## 2015-10-17 DIAGNOSIS — Y732 Prosthetic and other implants, materials and accessory gastroenterology and urology devices associated with adverse incidents: Secondary | ICD-10-CM | POA: Diagnosis not present

## 2015-10-17 DIAGNOSIS — Z79899 Other long term (current) drug therapy: Secondary | ICD-10-CM | POA: Insufficient documentation

## 2015-10-17 DIAGNOSIS — N390 Urinary tract infection, site not specified: Secondary | ICD-10-CM | POA: Diagnosis not present

## 2015-10-17 DIAGNOSIS — Z7982 Long term (current) use of aspirin: Secondary | ICD-10-CM | POA: Diagnosis not present

## 2015-10-17 DIAGNOSIS — T83010A Breakdown (mechanical) of cystostomy catheter, initial encounter: Secondary | ICD-10-CM

## 2015-10-17 DIAGNOSIS — Z792 Long term (current) use of antibiotics: Secondary | ICD-10-CM | POA: Insufficient documentation

## 2015-10-17 DIAGNOSIS — R339 Retention of urine, unspecified: Secondary | ICD-10-CM | POA: Diagnosis present

## 2015-10-17 DIAGNOSIS — Z8669 Personal history of other diseases of the nervous system and sense organs: Secondary | ICD-10-CM | POA: Insufficient documentation

## 2015-10-17 DIAGNOSIS — Z8613 Personal history of malaria: Secondary | ICD-10-CM | POA: Diagnosis not present

## 2015-10-17 LAB — URINALYSIS, ROUTINE W REFLEX MICROSCOPIC
Bilirubin Urine: NEGATIVE
GLUCOSE, UA: NEGATIVE mg/dL
Ketones, ur: NEGATIVE mg/dL
Nitrite: NEGATIVE
PROTEIN: NEGATIVE mg/dL
Specific Gravity, Urine: 1.013 (ref 1.005–1.030)
pH: 6 (ref 5.0–8.0)

## 2015-10-17 LAB — URINE MICROSCOPIC-ADD ON: SQUAMOUS EPITHELIAL / LPF: NONE SEEN

## 2015-10-17 MED ORDER — CEPHALEXIN 500 MG PO CAPS
500.0000 mg | ORAL_CAPSULE | Freq: Once | ORAL | Status: DC
  Filled 2015-10-17: qty 1

## 2015-10-17 MED ORDER — CEPHALEXIN 500 MG PO CAPS: 500.0000 mg | ORAL_CAPSULE | Freq: Three times a day (TID) | ORAL | Status: DC

## 2015-10-17 NOTE — ED Notes (Signed)
Pt left without getting d/c instructions or Keflex.   Tiffany, RN informed pt not in room.

## 2015-10-17 NOTE — ED Notes (Signed)
F/C changed out with Dr Dayna Barker; pt tolerated well

## 2015-10-17 NOTE — ED Notes (Signed)
Bed: WA09 Expected date:  Expected time:  Means of arrival:  Comments: Hold for triage 2 

## 2015-10-17 NOTE — ED Notes (Addendum)
Pt states that he usually gets 4400 cc out of his suprapubic cath a day but today he noticed scant urine and sediment in his urine. Has not taken flomax since Friday. Alert and oriented. Recent hx of the same.

## 2015-10-17 NOTE — ED Provider Notes (Signed)
CSN: [id number]     Arrival date & time 10/17/15  1706 History   First MD Initiated Contact with Patient 10/17/15 1720     Chief Complaint  Patient presents with  . Urinary Retention     (Consider location/radiation/quality/duration/timing/severity/associated sxs/prior Treatment) Patient is a 70 y.o. male presenting with general illness.  Illness Location:  Suprapubic catheter Quality:  Clogged Severity:  Mild Onset quality:  Gradual Duration:  1 day Timing:  Constant Chronicity:  New Context:  None Relieved by:  Nothing Associated symptoms: abdominal pain   Associated symptoms: no chest pain, no cough, no fever, no headaches and no shortness of breath     Past Medical History  Diagnosis Date  . Malaria   . Cervical myelopathy ([place])   . Coma ([place]) 1968    "for 2 wks"  . [id number])     "monthly" (06/10/2014)  . Arthritis     "hands, knees, back, legs" (06/10/2014)  . PTSD (post-traumatic stress disorder)     "I'm all over that now"  . Prostate cancer [facility])    Past Surgical History  Procedure Laterality Date  . Back surgery  2008    cervical and thoracic decompression for myelopathy  . Prostate surgery  03/2012    "shaved it down; didn't take it off"  . Anterior cervical decomp/discectomy fusion  05/2007    Archie Endo 02/16/2011  . Thoracic laminectomy  05/2007    Archie Endo 02/16/2011  . Shrapnel removal  1968    "HEAD, CHEST, LEFT HAND"   Family History  Problem Relation Age of Onset  . Heart disease Mother   . Heart disease Father   . Dementia Mother   . Dementia Father    Social History  Substance Use Topics  . Smoking status: Never Smoker   . Smokeless tobacco: Never Used     Comment: "smoked cigarettes  for ~ 1 wk in [place]"  . Alcohol Use: No    Review of Systems  Constitutional: Negative for fever and chills.  Eyes: Negative for pain and itching.  Respiratory: Negative for cough and shortness of breath.   Cardiovascular: Negative for chest pain.   Gastrointestinal: Positive for abdominal pain.  Genitourinary: Negative for dysuria, flank pain, discharge and penile pain.  Musculoskeletal: Negative for gait problem.  Neurological: Negative for headaches.  All other systems reviewed and are negative.     Allergies  Review of patient's allergies indicates no known allergies.  Home Medications   Prior to Admission medications   Medication Sig Start Date End Date Taking? Authorizing Provider  aspirin 325 MG tablet Take 325 mg by mouth daily.   Yes Historical Provider, MD  Calcium Carb-Cholecalciferol (CALCIUM 1000 + D PO) Take 1 tablet by mouth daily.   Yes Historical Provider, MD  Cyanocobalamin (VITAMIN B-12 PO) Take 2 tablets by mouth daily.   Yes Historical Provider, MD  docusate sodium (COLACE) 100 MG capsule Take 100 mg by mouth 2 (two) times daily as needed for mild constipation.   Yes Historical Provider, MD  furosemide (LASIX) 20 MG tablet Take 20 mg by mouth daily. 05/18/14  Yes Historical Provider, MD  meloxicam (MOBIC) 7.5 MG tablet Take 7.5 mg by mouth daily. 04/11/14  Yes Historical Provider, MD  Multiple Vitamins-Minerals (MULTIVITAMIN PO) Take 1 tablet by mouth daily.   Yes Historical Provider, MD  Polyvinyl Alcohol-Povidone (REFRESH OP) Place 1 drop into both eyes daily as needed (dry eyes).    Yes Historical Provider, MD  traMADol (  ULTRAM) 50 MG tablet Take 50 mg by mouth every 6 (six) hours as needed for moderate pain.   Yes Historical Provider, MD  vitamin E 1000 UNIT capsule Take 1,000 Units by mouth 2 (two) times daily.   Yes Historical Provider, MD  cephALEXin (KEFLEX) 500 MG capsule Take 1 capsule (500 mg total) by mouth 3 (three) times daily. 10/17/15   Corene Cornea Nedda Gains, MD   BP 139/63 mmHg  Pulse 103  Temp(Src) 97.5 F (36.4 C) (Oral)  Resp 20  SpO2 92% Physical Exam  Constitutional: He is oriented to person, place, and time. He appears well-developed and well-nourished.  HENT:  Head: Normocephalic and  atraumatic.  Neck: Normal range of motion.  Cardiovascular: Normal rate and regular rhythm.   Pulmonary/Chest: Effort normal. No respiratory distress.  Abdominal: Soft. He exhibits no distension.  Musculoskeletal: Normal range of motion. He exhibits no edema or tenderness.  Neurological: He is alert and oriented to person, place, and time. No cranial nerve deficit. Coordination normal.  Skin: Skin is warm and dry.  Nursing note and vitals reviewed.   ED Course  SUPRAPUBIC TUBE PLACEMENT Date/Time: 10/17/2015 8:29 PM Performed by: Merrily Pew Authorized by: Merrily Pew Consent: Verbal consent obtained. Required items: required blood products, implants, devices, and special equipment available Patient identity confirmed: verbally with patient Indications: catheter change Local anesthesia used: no Patient sedated: no Preparation: Patient was prepped and draped in the usual sterile fashion. Suprapubic aspiration by: catheter Catheter type: Foley Catheter size: 22 Fr Number of attempts: 1 Urine volume: 800 ml Urine characteristics: cloudy and yellow Patient tolerance: Patient tolerated the procedure well with no immediate complications   (including critical care time) Labs Review Labs Reviewed  URINALYSIS, ROUTINE W REFLEX MICROSCOPIC (NOT AT [facility]) - Abnormal; Notable for the following:    APPearance TURBID (*)    Hgb urine dipstick MODERATE (*)    Leukocytes, UA LARGE (*)    All other components within normal limits  URINE MICROSCOPIC-ADD ON - Abnormal; Notable for the following:    Bacteria, UA MANY (*)    All other components within normal limits    Imaging Review No results found. I have personally reviewed and evaluated these images and lab results as part of my medical decision-making.   EKG Interpretation None      MDM   Final diagnoses:  UTI (lower urinary tract infection)  Suprapubic catheter dysfunction, initial encounter ([place])    Clogged suprapubic  cath changed out per procedure note above. Also with UTI, started on abx. Will continue to follow with urology as scheduled.     Merrily Pew, MD 10/17/15 [phone]

## 2015-11-11 DIAGNOSIS — Z87440 Personal history of urinary (tract) infections: Secondary | ICD-10-CM | POA: Diagnosis not present

## 2015-11-11 DIAGNOSIS — N390 Urinary tract infection, site not specified: Secondary | ICD-10-CM | POA: Diagnosis not present

## 2015-12-03 DIAGNOSIS — N200 Calculus of kidney: Secondary | ICD-10-CM | POA: Diagnosis not present

## 2015-12-03 DIAGNOSIS — Z87440 Personal history of urinary (tract) infections: Secondary | ICD-10-CM | POA: Diagnosis not present

## 2015-12-07 DIAGNOSIS — E559 Vitamin D deficiency, unspecified: Secondary | ICD-10-CM | POA: Diagnosis not present

## 2015-12-07 DIAGNOSIS — E785 Hyperlipidemia, unspecified: Secondary | ICD-10-CM | POA: Diagnosis not present

## 2015-12-07 DIAGNOSIS — N400 Enlarged prostate without lower urinary tract symptoms: Secondary | ICD-10-CM | POA: Diagnosis not present

## 2015-12-07 DIAGNOSIS — E781 Pure hyperglyceridemia: Secondary | ICD-10-CM | POA: Diagnosis not present

## 2015-12-07 DIAGNOSIS — R609 Edema, unspecified: Secondary | ICD-10-CM | POA: Diagnosis not present

## 2015-12-07 DIAGNOSIS — Z79899 Other long term (current) drug therapy: Secondary | ICD-10-CM | POA: Diagnosis not present

## 2015-12-07 DIAGNOSIS — Z7982 Long term (current) use of aspirin: Secondary | ICD-10-CM | POA: Diagnosis not present

## 2015-12-07 DIAGNOSIS — N200 Calculus of kidney: Secondary | ICD-10-CM | POA: Diagnosis not present

## 2015-12-07 DIAGNOSIS — E7800 Pure hypercholesterolemia, unspecified: Secondary | ICD-10-CM | POA: Diagnosis not present

## 2015-12-07 DIAGNOSIS — R946 Abnormal results of thyroid function studies: Secondary | ICD-10-CM | POA: Diagnosis not present

## 2015-12-07 DIAGNOSIS — G629 Polyneuropathy, unspecified: Secondary | ICD-10-CM | POA: Diagnosis not present

## 2015-12-17 DIAGNOSIS — N200 Calculus of kidney: Secondary | ICD-10-CM | POA: Diagnosis not present

## 2016-01-07 DIAGNOSIS — N319 Neuromuscular dysfunction of bladder, unspecified: Secondary | ICD-10-CM | POA: Diagnosis not present

## 2016-01-07 DIAGNOSIS — N200 Calculus of kidney: Secondary | ICD-10-CM | POA: Diagnosis not present

## 2016-01-07 DIAGNOSIS — N390 Urinary tract infection, site not specified: Secondary | ICD-10-CM | POA: Diagnosis not present

## 2016-02-17 DIAGNOSIS — N319 Neuromuscular dysfunction of bladder, unspecified: Secondary | ICD-10-CM | POA: Diagnosis not present

## 2016-03-24 DIAGNOSIS — N319 Neuromuscular dysfunction of bladder, unspecified: Secondary | ICD-10-CM | POA: Diagnosis not present

## 2016-04-28 DIAGNOSIS — N319 Neuromuscular dysfunction of bladder, unspecified: Secondary | ICD-10-CM | POA: Diagnosis not present

## 2016-06-01 DIAGNOSIS — N319 Neuromuscular dysfunction of bladder, unspecified: Secondary | ICD-10-CM | POA: Diagnosis not present

## 2016-06-30 DIAGNOSIS — N312 Flaccid neuropathic bladder, not elsewhere classified: Secondary | ICD-10-CM | POA: Insufficient documentation

## 2016-06-30 DIAGNOSIS — N200 Calculus of kidney: Secondary | ICD-10-CM | POA: Diagnosis not present

## 2016-06-30 DIAGNOSIS — R339 Retention of urine, unspecified: Secondary | ICD-10-CM | POA: Diagnosis not present

## 2016-06-30 HISTORY — DX: Calculus of kidney: N20.0

## 2016-06-30 HISTORY — DX: Flaccid neuropathic bladder, not elsewhere classified: N31.2

## 2016-08-07 DIAGNOSIS — N200 Calculus of kidney: Secondary | ICD-10-CM | POA: Diagnosis not present

## 2016-08-07 DIAGNOSIS — R319 Hematuria, unspecified: Secondary | ICD-10-CM | POA: Diagnosis not present

## 2016-08-07 DIAGNOSIS — N312 Flaccid neuropathic bladder, not elsewhere classified: Secondary | ICD-10-CM | POA: Diagnosis not present

## 2016-08-07 DIAGNOSIS — N390 Urinary tract infection, site not specified: Secondary | ICD-10-CM | POA: Diagnosis not present

## 2016-09-14 DIAGNOSIS — N200 Calculus of kidney: Secondary | ICD-10-CM | POA: Diagnosis not present

## 2016-09-14 DIAGNOSIS — N319 Neuromuscular dysfunction of bladder, unspecified: Secondary | ICD-10-CM | POA: Diagnosis not present

## 2016-11-26 ENCOUNTER — Encounter (HOSPITAL_COMMUNITY): Payer: Self-pay

## 2016-11-26 ENCOUNTER — Emergency Department (HOSPITAL_COMMUNITY)
Admission: EM | Admit: 2016-11-26 | Discharge: 2016-11-26 | Disposition: A | Discharge status: Home / Self Care | Payer: Medicare Other | Attending: Emergency Medicine | Admitting: Emergency Medicine

## 2016-11-26 DIAGNOSIS — T83090A Other mechanical complication of cystostomy catheter, initial encounter: Secondary | ICD-10-CM | POA: Diagnosis not present

## 2016-11-26 DIAGNOSIS — Y732 Prosthetic and other implants, materials and accessory gastroenterology and urology devices associated with adverse incidents: Secondary | ICD-10-CM | POA: Diagnosis not present

## 2016-11-26 DIAGNOSIS — I110 Hypertensive heart disease with heart failure: Secondary | ICD-10-CM | POA: Insufficient documentation

## 2016-11-26 DIAGNOSIS — N390 Urinary tract infection, site not specified: Secondary | ICD-10-CM | POA: Insufficient documentation

## 2016-11-26 DIAGNOSIS — Z8546 Personal history of malignant neoplasm of prostate: Secondary | ICD-10-CM | POA: Diagnosis not present

## 2016-11-26 DIAGNOSIS — T83010A Breakdown (mechanical) of cystostomy catheter, initial encounter: Secondary | ICD-10-CM

## 2016-11-26 DIAGNOSIS — T83098A Other mechanical complication of other indwelling urethral catheter, initial encounter: Secondary | ICD-10-CM | POA: Diagnosis not present

## 2016-11-26 DIAGNOSIS — I5032 Chronic diastolic (congestive) heart failure: Secondary | ICD-10-CM | POA: Diagnosis not present

## 2016-11-26 LAB — COMPREHENSIVE METABOLIC PANEL
ALT: 25 U/L (ref 17–63)
AST: 26 U/L (ref 15–41)
Albumin: 3.6 g/dL (ref 3.5–5.0)
Alkaline Phosphatase: 46 U/L (ref 38–126)
Anion gap: 9 (ref 5–15)
BILIRUBIN TOTAL: 1.1 mg/dL (ref 0.3–1.2)
BUN: 21 mg/dL — AB (ref 6–20)
CALCIUM: 9.1 mg/dL (ref 8.9–10.3)
CO2: 28 mmol/L (ref 22–32)
CREATININE: 1.21 mg/dL (ref 0.61–1.24)
Chloride: 103 mmol/L (ref 101–111)
GFR calc Af Amer: >60 mL/min (ref >60)
GFR, EST NON AFRICAN AMERICAN: 58 mL/min — AB (ref >60)
Glucose, Bld: 106 mg/dL — ABNORMAL HIGH (ref 65–99)
Potassium: 4.6 mmol/L (ref 3.5–5.1)
Sodium: 140 mmol/L (ref 135–145)
TOTAL PROTEIN: 7.5 g/dL (ref 6.5–8.1)

## 2016-11-26 LAB — CBC WITH DIFFERENTIAL/PLATELET
BASOS ABS: 0 10*3/uL (ref 0.0–0.1)
Basophils Relative: 0 %
EOS ABS: 0.2 10*3/uL (ref 0.0–0.7)
EOS PCT: 3 %
HCT: 41.8 % (ref 39.0–52.0)
Hemoglobin: 13.9 g/dL (ref 13.0–17.0)
Lymphocytes Relative: 24 %
Lymphs Abs: 1.9 10*3/uL (ref 0.7–4.0)
MCH: 30.3 pg (ref 26.0–34.0)
MCHC: 33.3 g/dL (ref 30.0–36.0)
MCV: 91.1 fL (ref 78.0–100.0)
Monocytes Absolute: 0.9 10*3/uL (ref 0.1–1.0)
Monocytes Relative: 12 %
Neutro Abs: 4.7 10*3/uL (ref 1.7–7.7)
Neutrophils Relative %: 61 %
PLATELETS: 201 10*3/uL (ref 150–400)
RBC: 4.59 MIL/uL (ref 4.22–5.81)
RDW: 14.7 % (ref 11.5–15.5)
WBC: 7.7 10*3/uL (ref 4.0–10.5)

## 2016-11-26 LAB — URINALYSIS, ROUTINE W REFLEX MICROSCOPIC
Bilirubin Urine: NEGATIVE
GLUCOSE, UA: NEGATIVE mg/dL
KETONES UR: NEGATIVE mg/dL
Nitrite: POSITIVE — AB
PH: 7 (ref 5.0–8.0)
Protein, ur: 30 mg/dL — AB
Specific Gravity, Urine: 1.012 (ref 1.005–1.030)

## 2016-11-26 MED ORDER — CEPHALEXIN 500 MG PO CAPS: 500.0000 mg | ORAL_CAPSULE | Freq: Three times a day (TID) | ORAL | 0 refills | Status: AC

## 2016-11-26 NOTE — ED Notes (Signed)
Unable to get reading on bladder scan DR. Long made aware

## 2016-11-26 NOTE — Discharge Instructions (Signed)
You were seen in the ED today with clogged suprapubic catheter. We replaced the catheter and have started you on an antibiotic for a possible urine infection. See your Urologist as scheduled this coming week. Return to the ED with any additional clogging or infection symptoms such as fever, chills, vomiting, or diarrhea.

## 2016-11-26 NOTE — ED Notes (Signed)
This RN attempted to irrigate foley. There was too much resistance to flush any sterile water in. However a small amount of fluid was able to be drawn from the foley. Following this, there was a small amount of drainage going to the foley bag.

## 2016-11-26 NOTE — ED Provider Notes (Signed)
Emergency Department Provider Note   I have reviewed the triage vital signs and the nursing notes.   HISTORY  Chief Complaint CLOGGED CATHETER (SUPRAPUBIC)   HPI Jerry [name] is a 71 y.o. male with PMH of prostate cancer requiring suprapubic catheter presents to the emergency department for evaluation of lower abdominal pressure and no urine output from his suprapubic catheter. Patient states that the catheter was due to be changed this coming Friday. He denies any blood or clot prior to the catheter stopping draining. No associated fevers or chills. He called his urologist Dr. Venia Minks in [facility] who told him to call 911. He drove himself here with no improvement in symptoms. No diarrhea, nausea, or vomiting. Patient has had some drainage from the catheter last night but none since then. He notes heavy sediment prior to clogging and is concerned that he may have a UTI.   Past Medical History:  Diagnosis Date  . Arthritis    "hands, knees, back, legs" (06/10/2014)  . Cervical myelopathy ([place])   . Coma ([place]) 1968   "for 2 wks"  . [id number])    "monthly" (06/10/2014)  . Malaria   . Prostate cancer ([place])   . PTSD (post-traumatic stress disorder)    "I'm all over that now"    Patient Active Problem List   Diagnosis Date Noted  . Staphylococcus aureus bacteremia 06/30/2014  . Generalized weakness 06/23/2014  . Chronic diastolic congestive heart failure ([place]) 06/23/2014  . HTN (hypertension) 06/23/2014  . Prostate cancer ([place]) 06/23/2014  . Presence of suprapubic catheter ([place]) 06/17/2014  . Cervical myelopathy ([place])   . Arthritis   . Urinary incontinence, functional 10/25/2012  . Bilateral leg weakness 10/25/2012  . Fall at home 10/25/2012  . UTI (lower urinary tract infection) 10/25/2012  . Sleep apnea 10/25/2012  . Medically noncompliant 10/25/2012  . Obesity 10/25/2012    Past Surgical History:  Procedure Laterality Date  . ANTERIOR CERVICAL  DECOMP/DISCECTOMY FUSION  05/2007   Archie Endo 02/16/2011  . BACK SURGERY  2008   cervical and thoracic decompression for myelopathy  . PROSTATE SURGERY  03/2012   "shaved it down; didn't take it off"  . SHRAPNEL REMOVAL  1968   "HEAD, CHEST, LEFT HAND"  . SUPRAPUBIC CATHETER INSERTION    . THORACIC LAMINECTOMY  05/2007   Archie Endo 02/16/2011    Current Outpatient Rx  . Order #: [id number] Class: Historical Med  . Order #: [id number] Class: Historical Med  . Order #: [id number] Class: Historical Med  . Order #: [id number] Class: Historical Med  . Order #: [id number] Class: Historical Med  . Order #: [id number] Class: Historical Med  . Order #: [id number] Class: Historical Med  . Order #: [id number] Class: Historical Med  . Order #: [id number] Class: Historical Med  . Order #: [id number] Class: Historical Med  . Order #: [id number] Class: Historical Med  . Order #: [id number] Class: Historical Med  . Order #: [id number] Class: Historical Med  . Order #: [id number] Class: Print    Allergies Patient has no known allergies.  Family History  Problem Relation Age of Onset  . Heart disease Mother   . Dementia Mother   . Heart disease Father   . Dementia Father     Social History Social History  Substance Use Topics  . Smoking status: Never Smoker  . Smokeless tobacco: Never Used     Comment: "smoked cigarettes  for ~ 1 wk in [place]"  . Alcohol use No    Review of Systems  Constitutional: No fever/chills Eyes: No  visual changes. ENT: No sore throat. Cardiovascular: Denies chest pain. Respiratory: Denies shortness of breath. Gastrointestinal: Positive lower abdominal pain.  No nausea, no vomiting.  No diarrhea.  No constipation. Genitourinary: Positive decreased drainage from suprapubic cath.  Musculoskeletal: Negative for back pain. Skin: Negative for rash. Neurological: Negative for headaches, focal weakness or numbness.  10-point ROS otherwise  negative.  ____________________________________________   PHYSICAL EXAM:  VITAL SIGNS: ED Triage Vitals  Enc Vitals Group     BP 11/26/16 1305 139/64     Pulse Rate 11/26/16 1305 69     Resp 11/26/16 1305 20     Temp 11/26/16 1305 97 F (36.1 C)     Temp Source 11/26/16 1305 Oral     SpO2 11/26/16 1305 94 %     Weight 11/26/16 1306 288 lb (130.6 kg)     Height 11/26/16 1306 6\' 5"  (1.956 m)     Pain Score 11/26/16 1310 6   Constitutional: Alert and oriented. Well appearing and in no acute distress. Eyes: Conjunctivae are normal.  Head: Atraumatic. Nose: No congestion/rhinnorhea. Mouth/Throat: Mucous membranes are moist.   Neck: No stridor.   Cardiovascular: Normal rate, regular rhythm. Good peripheral circulation. Grossly normal heart sounds.   Respiratory: Normal respiratory effort.  No retractions. Lungs CTAB. Gastrointestinal: Soft with mild lower abdominal discomfort. Suprapubic catheter in place with minimal urine in the bag. No surrounding erythema. No distention.  Musculoskeletal: No lower extremity tenderness nor edema. No gross deformities of extremities. Neurologic:  Normal speech and language. No gross focal neurologic deficits are appreciated.  Skin:  Skin is warm, dry and intact. No rash noted. Psychiatric: Mood and affect are normal. Speech and behavior are normal.  ____________________________________________   LABS (all labs ordered are listed, but only abnormal results are displayed)  Labs Reviewed  URINALYSIS, ROUTINE W REFLEX MICROSCOPIC - Abnormal; Notable for the following:       Result Value   Color, Urine AMBER (*)    APPearance CLOUDY (*)    Hgb urine dipstick MODERATE (*)    Protein, ur 30 (*)    Nitrite POSITIVE (*)    Leukocytes, UA LARGE (*)    Bacteria, UA MANY (*)    Squamous Epithelial / LPF 0-5 (*)    All other components within normal limits  COMPREHENSIVE METABOLIC PANEL - Abnormal; Notable for the following:    Glucose, Bld 106  (*)    BUN 21 (*)    GFR calc non Af Amer 58 (*)    All other components within normal limits  CBC WITH DIFFERENTIAL/PLATELET   ____________________________________________  RADIOLOGY  None ____________________________________________   PROCEDURES  Procedure(s) performed:   Procedures  Limited Ultrasound of bladder  Performed by Dr. Laverta Baltimore Indication: to assess for urinary retention and/or bladder volume prior to urinary catheter Technique:  Low frequency probe utilized in two planes to assess bladder volume in real-time. Findings: Distended urinary bladder with suprapubic catheter tracking to that area.  Additional findings: No evidence of surrounding fluid.   ____________________________________________   INITIAL IMPRESSION / ASSESSMENT AND PLAN / ED COURSE  Pertinent labs & imaging results that were available during my care of the patient were reviewed by me and considered in my medical decision making (see chart for details).  Patient resents to the emergency department for evaluation of no output from his suprapubic catheter since yesterday. He irrigated at home with no improvement. Bedside bladder scan is unable to identify significant fluid collection. I performed bedside ultrasound  shows a distended bladder. Plan for labs and attempted irrigation here in the emergency department. Will discuss with Dr. Venia Minks at [facility] vs our Urology group here.   Spoke with Urology on call. Suprapubic catheter replaced with 24 F catheter and approx 600 ml of urine flowing without difficulty. Patient has f/u scheduled with his Urologist on Friday. Started Keflex with evidence of infection on UA which may be colonization but patient reported seeing increased sediment recently and felt like he was getting a UTI so will treat.   At this time, I do not feel there is any life-threatening condition present. I have reviewed and discussed all results (EKG, imaging, lab, urine as appropriate), exam  findings with patient. I have reviewed nursing notes and appropriate previous records.  I feel the patient is safe to be discharged home without further emergent workup. Discussed usual and customary return precautions. Patient and family (if present) verbalize understanding and are comfortable with this plan.  Patient will follow-up with their primary care provider. If they do not have a primary care provider, information for follow-up has been provided to them. All questions have been answered.  ____________________________________________  FINAL CLINICAL IMPRESSION(S) / ED DIAGNOSES  Final diagnoses:  Suprapubic catheter dysfunction, initial encounter ([place])  Urinary tract infection without hematuria, site unspecified     MEDICATIONS GIVEN DURING THIS VISIT:  None  NEW OUTPATIENT MEDICATIONS STARTED DURING THIS VISIT:  New Prescriptions   CEPHALEXIN (KEFLEX) 500 MG CAPSULE    Take 1 capsule (500 mg total) by mouth 3 (three) times daily.     Note:  This document was prepared using Dragon voice recognition software and may include unintentional dictation errors.  Nanda Quinton, MD Emergency Medicine   Margette Fast, MD 11/26/16 [phone]

## 2016-11-26 NOTE — ED Triage Notes (Signed)
PT C/O A CLOGGED SUPRAPUBIC CATHETER SINCE LAST NIGHT WITH LOWER ABDOMINAL PRESSURE. PT STS HE HAS BEEN TRYING TO IRRIGATE IT SINCE 0600 WITH NO URINE OUTPUT.

## 2016-12-24 ENCOUNTER — Encounter (HOSPITAL_COMMUNITY): Payer: Self-pay | Admitting: Emergency Medicine

## 2016-12-24 ENCOUNTER — Emergency Department (HOSPITAL_COMMUNITY)
Admission: EM | Admit: 2016-12-24 | Discharge: 2016-12-24 | Disposition: A | Discharge status: Home / Self Care | Payer: Medicare Other | Attending: Emergency Medicine | Admitting: Emergency Medicine

## 2016-12-24 DIAGNOSIS — T83098A Other mechanical complication of other indwelling urethral catheter, initial encounter: Secondary | ICD-10-CM | POA: Insufficient documentation

## 2016-12-24 DIAGNOSIS — N390 Urinary tract infection, site not specified: Secondary | ICD-10-CM | POA: Diagnosis not present

## 2016-12-24 DIAGNOSIS — T83510A Infection and inflammatory reaction due to cystostomy catheter, initial encounter: Secondary | ICD-10-CM

## 2016-12-24 DIAGNOSIS — T83010A Breakdown (mechanical) of cystostomy catheter, initial encounter: Secondary | ICD-10-CM

## 2016-12-24 DIAGNOSIS — Z79899 Other long term (current) drug therapy: Secondary | ICD-10-CM | POA: Diagnosis not present

## 2016-12-24 DIAGNOSIS — Y732 Prosthetic and other implants, materials and accessory gastroenterology and urology devices associated with adverse incidents: Secondary | ICD-10-CM | POA: Diagnosis not present

## 2016-12-24 DIAGNOSIS — I10 Essential (primary) hypertension: Secondary | ICD-10-CM | POA: Diagnosis not present

## 2016-12-24 DIAGNOSIS — Z8546 Personal history of malignant neoplasm of prostate: Secondary | ICD-10-CM | POA: Insufficient documentation

## 2016-12-24 LAB — URINALYSIS, ROUTINE W REFLEX MICROSCOPIC
Bilirubin Urine: NEGATIVE
Glucose, UA: NEGATIVE mg/dL
Ketones, ur: NEGATIVE mg/dL
Nitrite: POSITIVE — AB
PH: 7 (ref 5.0–8.0)
PROTEIN: 30 mg/dL — AB
Specific Gravity, Urine: 1.012 (ref 1.005–1.030)
Squamous Epithelial / LPF: NONE SEEN

## 2016-12-24 MED ORDER — CEPHALEXIN 500 MG PO CAPS: 500.0000 mg | ORAL_CAPSULE | Freq: Two times a day (BID) | ORAL | 0 refills | Status: AC

## 2016-12-24 MED ORDER — CEPHALEXIN 500 MG PO CAPS
500.0000 mg | ORAL_CAPSULE | Freq: Once | ORAL | Status: AC
  Administered 2016-12-24: 500 mg via ORAL
  Filled 2016-12-24: qty 1

## 2016-12-24 NOTE — ED Triage Notes (Signed)
Pt reports his suprapubic catheter has not had any drainage since last night. Began to have pain this am. Tried to flush catheter with no success. Last catheter replacement a month ago.

## 2016-12-24 NOTE — ED Provider Notes (Signed)
[facility] DEPT Provider Note   CSN: [id number] Arrival date & time: 12/24/16  1147     History   Chief Complaint Chief Complaint  Patient presents with  . Urinary Retention    HPI Jerry [name] is a 71 y.o. male.  HPI  Patient presents with concern of a clogged suprapubic catheter. He notes that over the past 12 hours patient has had minimal output into his suprapubic catheter bag. He has noticed some more opaque material as well. No other new complaints, including no fever, no vomiting, no diarrhea, no abdominal pain. Patient has a catheter due to history of prostatitis, now after surgical intervention, without change in functional status. This catheter and bag was last changed 1 month ago.   Past Medical History:  Diagnosis Date  . Arthritis    "hands, knees, back, legs" (06/10/2014)  . Cervical myelopathy ([place])   . Coma ([place]) 1968   "for 2 wks"  . TWSFKCLE(751.7)    "monthly" (06/10/2014)  . Malaria   . Prostate cancer ([place])   . PTSD (post-traumatic stress disorder)    "I'm all over that now"    Patient Active Problem List   Diagnosis Date Noted  . Staphylococcus aureus bacteremia 06/30/2014  . Generalized weakness 06/23/2014  . Chronic diastolic congestive heart failure ([place]) 06/23/2014  . HTN (hypertension) 06/23/2014  . Prostate cancer ([place]) 06/23/2014  . Presence of suprapubic catheter ([place]) 06/17/2014  . Cervical myelopathy ([place])   . Arthritis   . Urinary incontinence, functional 10/25/2012  . Bilateral leg weakness 10/25/2012  . Fall at home 10/25/2012  . UTI (lower urinary tract infection) 10/25/2012  . Sleep apnea 10/25/2012  . Medically noncompliant 10/25/2012  . Obesity 10/25/2012    Past Surgical History:  Procedure Laterality Date  . ANTERIOR CERVICAL DECOMP/DISCECTOMY FUSION  05/2007   Archie Endo 02/16/2011  . BACK SURGERY  2008   cervical and thoracic decompression for myelopathy  . PROSTATE SURGERY  03/2012   "shaved it down; didn't  take it off"  . SHRAPNEL REMOVAL  1968   "HEAD, CHEST, LEFT HAND"  . SUPRAPUBIC CATHETER INSERTION    . THORACIC LAMINECTOMY  05/2007   Archie Endo 02/16/2011       Home Medications    Prior to Admission medications   Medication Sig Start Date End Date Taking? Authorizing Provider  [place]-Junip-K Gluc-Pars-Uva Ur (WATER PILL/POTASSIUM) TABS Take 1 tablet by mouth daily.   Yes Historical Provider, MD  Calcium Carb-Cholecalciferol (CALCIUM 1000 + D PO) Take 1 tablet by mouth daily.   Yes Historical Provider, MD  Cholecalciferol (VITAMIN D3) 10000 units capsule Take 10,000 Units by mouth daily.   Yes Historical Provider, MD  Coenzyme Q10 (CO Q 10 PO) Take 600 mg by mouth daily.   Yes Historical Provider, MD  docusate sodium (COLACE) 100 MG capsule Take 100 mg by mouth 2 (two) times daily as needed for mild constipation.   Yes Historical Provider, MD  Glucosamine-Chondroitin (GLUCOSAMINE CHONDR COMPLEX PO) Take 600 mg by mouth daily.   Yes Historical Provider, MD  meloxicam (MOBIC) 7.5 MG tablet Take 7.5 mg by mouth daily. 04/11/14  Yes Historical Provider, MD  Multiple Vitamins-Minerals (MULTIVITAMIN PO) Take 1 tablet by mouth daily.   Yes Historical Provider, MD  Pomegranate, Punica granatum, (POMEGRANATE PO) Take 1 tablet by mouth daily.   Yes Historical Provider, MD  traMADol (ULTRAM) 50 MG tablet Take 50 mg by mouth every 6 (six) hours as needed for moderate pain.   Yes Historical  Provider, MD  vitamin B-12 (CYANOCOBALAMIN) 1000 MCG tablet Take 2,000 mg by mouth daily.    Yes Historical Provider, MD  vitamin E 1000 UNIT capsule Take 1,000 Units by mouth 2 (two) times daily.   Yes Historical Provider, MD    Family History Family History  Problem Relation Age of Onset  . Heart disease Mother   . Dementia Mother   . Heart disease Father   . Dementia Father     Social History Social History  Substance Use Topics  . Smoking status: Never Smoker  . Smokeless tobacco: Never Used      Comment: "smoked cigarettes  for ~ 1 wk in [place]"  . Alcohol use No     Allergies   Patient has no known allergies.   Review of Systems Review of Systems  Constitutional:       Per HPI, otherwise negative  HENT:       Per HPI, otherwise negative  Respiratory:       Per HPI, otherwise negative  Cardiovascular:       Per HPI, otherwise negative  Gastrointestinal: Negative for vomiting.  Endocrine:       Negative aside from HPI  Genitourinary:       Neg aside from HPI   Musculoskeletal:       Per HPI, otherwise negative  Skin: Negative.   Allergic/Immunologic: Negative for immunocompromised state.  Neurological: Negative for syncope.       Baseline gait difficulty, unchanged     Physical Exam Updated Vital Signs BP 128/73   Pulse 94   Temp 97.2 F (36.2 C) (Oral)   Resp 16   SpO2 93%   Physical Exam  Constitutional: He is oriented to person, place, and time. He appears well-developed. No distress.  Obese elderly male awake, alert, sitting upright, speaking clearly  HENT:  Head: Normocephalic and atraumatic.  Eyes: Conjunctivae and EOM are normal.  Cardiovascular: Normal rate and regular rhythm.   Pulmonary/Chest: Effort normal. No stridor. No respiratory distress.  Abdominal: He exhibits no distension.    Genitourinary:     Musculoskeletal: He exhibits no edema.  Neurological: He is alert and oriented to person, place, and time.  Skin: Skin is warm and dry.  Excessively dry, flaking  Psychiatric: He has a normal mood and affect.  Nursing note and vitals reviewed.    ED Treatments / Results  Labs (all labs ordered are listed, but only abnormal results are displayed) Labs Reviewed  URINALYSIS, ROUTINE W REFLEX MICROSCOPIC - Abnormal; Notable for the following:       Result Value   APPearance TURBID (*)    Hgb urine dipstick MODERATE (*)    Protein, ur 30 (*)    Nitrite POSITIVE (*)    Leukocytes, UA LARGE (*)    Bacteria, UA MANY (*)    All  other components within normal limits    EKG  EKG Interpretation None       Radiology No results found.  Procedures BLADDER CATHETERIZATION Date/Time: 12/24/2016 12:34 PM Performed by: Carmin Muskrat Authorized by: Carmin Muskrat   Consent:    Consent obtained:  Verbal   Consent given by:  Patient   Risks discussed:  Infection and pain   Alternatives discussed:  No treatment and delayed treatment Pre-procedure details:    Procedure purpose:  Diagnostic   Preparation: Patient was prepped and draped in usual sterile fashion   Anesthesia (see MAR for exact dosages):    Anesthesia method:  None Procedure details:    Procedure performed by provider due to: indwelling suprapubic - done w RN.   Catheter insertion:  Indwelling   Catheter type: suprapubic.   Catheter size:  24 Fr   Bladder irrigation: no     Number of attempts:  1   Urine characteristics:  Yellow Post-procedure details:    Patient tolerance of procedure:  Tolerated well, no immediate complications    (including critical care time)  Medications Ordered in ED Medications  cephALEXin (KEFLEX) capsule 500 mg (not administered)     Initial Impression / Assessment and Plan / ED Course  I have reviewed the triage vital signs and the nursing notes.  Pertinent labs & imaging results that were available during my care of the patient were reviewed by me and considered in my medical decision making (see chart for details).  1:09 PM Patient tolerated replacement of his suprapubic catheter without complication.  Urinalysis notable for infection. Patient will start Keflex therapy, follow-up with urology.  No evidence for bacteremia, sepsis, no distress, no fever, no significant instability.  Final Clinical Impressions(s) / ED Diagnoses  Suprapubic catheter malfunction Urinary tract infection, catheter associated    Carmin Muskrat, MD 12/24/16 1310

## 2016-12-24 NOTE — ED Notes (Signed)
#   24 french foley cath. Replaced at this time without incident.

## 2016-12-24 NOTE — Discharge Instructions (Signed)
As discussed, your evaluation today has been largely reassuring.  But, it is important that you monitor your condition carefully, and do not hesitate to return to the ED if you develop new, or concerning changes in your condition. ? ?Otherwise, please follow-up with your physician for appropriate ongoing care. ? ?

## 2016-12-25 LAB — URINE CULTURE

## 2017-01-20 ENCOUNTER — Emergency Department (HOSPITAL_COMMUNITY)
Admission: EM | Admit: 2017-01-20 | Discharge: 2017-01-20 | Disposition: A | Discharge status: Home / Self Care | Payer: Medicare Other | Attending: Emergency Medicine | Admitting: Emergency Medicine

## 2017-01-20 ENCOUNTER — Encounter (HOSPITAL_COMMUNITY): Payer: Self-pay | Admitting: *Deleted

## 2017-01-20 DIAGNOSIS — I110 Hypertensive heart disease with heart failure: Secondary | ICD-10-CM | POA: Insufficient documentation

## 2017-01-20 DIAGNOSIS — Z79899 Other long term (current) drug therapy: Secondary | ICD-10-CM | POA: Insufficient documentation

## 2017-01-20 DIAGNOSIS — T839XXA Unspecified complication of genitourinary prosthetic device, implant and graft, initial encounter: Secondary | ICD-10-CM

## 2017-01-20 DIAGNOSIS — T83098A Other mechanical complication of other indwelling urethral catheter, initial encounter: Secondary | ICD-10-CM | POA: Diagnosis not present

## 2017-01-20 DIAGNOSIS — T83511A Infection and inflammatory reaction due to indwelling urethral catheter, initial encounter: Secondary | ICD-10-CM

## 2017-01-20 DIAGNOSIS — N390 Urinary tract infection, site not specified: Secondary | ICD-10-CM | POA: Diagnosis not present

## 2017-01-20 DIAGNOSIS — I5032 Chronic diastolic (congestive) heart failure: Secondary | ICD-10-CM | POA: Insufficient documentation

## 2017-01-20 DIAGNOSIS — Z8546 Personal history of malignant neoplasm of prostate: Secondary | ICD-10-CM | POA: Diagnosis not present

## 2017-01-20 DIAGNOSIS — N289 Disorder of kidney and ureter, unspecified: Secondary | ICD-10-CM | POA: Diagnosis not present

## 2017-01-20 DIAGNOSIS — Y732 Prosthetic and other implants, materials and accessory gastroenterology and urology devices associated with adverse incidents: Secondary | ICD-10-CM | POA: Diagnosis not present

## 2017-01-20 LAB — CBC WITH DIFFERENTIAL/PLATELET
BASOS PCT: 0 %
Basophils Absolute: 0 10*3/uL (ref 0.0–0.1)
Eosinophils Absolute: 0.3 10*3/uL (ref 0.0–0.7)
Eosinophils Relative: 3 %
HEMATOCRIT: 41.8 % (ref 39.0–52.0)
Hemoglobin: 14.1 g/dL (ref 13.0–17.0)
LYMPHS ABS: 1.8 10*3/uL (ref 0.7–4.0)
LYMPHS PCT: 17 %
MCH: 31.3 pg (ref 26.0–34.0)
MCHC: 33.7 g/dL (ref 30.0–36.0)
MCV: 92.7 fL (ref 78.0–100.0)
MONOS PCT: 10 %
Monocytes Absolute: 1 10*3/uL (ref 0.1–1.0)
Neutro Abs: 7.1 10*3/uL (ref 1.7–7.7)
Neutrophils Relative %: 70 %
Platelets: 239 10*3/uL (ref 150–400)
RBC: 4.51 MIL/uL (ref 4.22–5.81)
RDW: 13.9 % (ref 11.5–15.5)
WBC: 10.1 10*3/uL (ref 4.0–10.5)

## 2017-01-20 LAB — BASIC METABOLIC PANEL
Anion gap: 8 (ref 5–15)
BUN: 25 mg/dL — AB (ref 6–20)
CO2: 28 mmol/L (ref 22–32)
Calcium: 8.9 mg/dL (ref 8.9–10.3)
Chloride: 102 mmol/L (ref 101–111)
Creatinine, Ser: 1.42 mg/dL — ABNORMAL HIGH (ref 0.61–1.24)
GFR calc Af Amer: 55 mL/min — ABNORMAL LOW (ref >60)
GFR, EST NON AFRICAN AMERICAN: 48 mL/min — AB (ref >60)
GLUCOSE: 169 mg/dL — AB (ref 65–99)
Potassium: 4.1 mmol/L (ref 3.5–5.1)
Sodium: 138 mmol/L (ref 135–145)

## 2017-01-20 LAB — URINALYSIS, ROUTINE W REFLEX MICROSCOPIC
BILIRUBIN URINE: NEGATIVE
GLUCOSE, UA: NEGATIVE mg/dL
KETONES UR: NEGATIVE mg/dL
NITRITE: NEGATIVE
PROTEIN: 100 mg/dL — AB
Specific Gravity, Urine: 1.015 (ref 1.005–1.030)
Squamous Epithelial / LPF: NONE SEEN
pH: 5 (ref 5.0–8.0)

## 2017-01-20 MED ORDER — CEPHALEXIN 500 MG PO CAPS: 500.0000 mg | ORAL_CAPSULE | Freq: Three times a day (TID) | ORAL | 0 refills | Status: DC

## 2017-01-20 NOTE — ED Provider Notes (Signed)
Complains of mild generalized weakness since suprapubic catheter was foundry clogged this afternoon. No other associated symptoms. No fever no shortness of breath. He feels much improved since his suprapubic catheter is changed. Feels well to go home   Orlie Dakin, MD 01/20/17 1659

## 2017-01-20 NOTE — ED Provider Notes (Signed)
[facility] DEPT Provider Note   CSN: [id number] Arrival date & time: 01/20/17  1438     History   Chief Complaint Chief Complaint  Patient presents with  . Urinary Retention    HPI Jerry [name] is a 71 y.o. male.  HPI   Pt with hx suprapubic catheter x 6 years due to prostatitis and prostate cancer p/w clogged catheter.  States there is glue-like sediment that comes out and frequently clogs the tube, this is the 4th time this year.  Has been septic twice for same.  States he gets the catheter changed once a month and irrigates it every other day wit6h normal saline.  States he has noticed the infection is getting worse and feels generalized fatigue, lower abdominal pain - states this is exactly like his prior UTIs.  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    Past Medical History:  Diagnosis Date  . Arthritis    "hands, knees, back, legs" (06/10/2014)  . Cervical myelopathy ([place])   . Coma ([place]) 1968   "for 2 wks"  . JJKKXFGH(829.9)    "monthly" (06/10/2014)  . Malaria   . Prostate cancer ([place])   . PTSD (post-traumatic stress disorder)    "I'm all over that now"    Patient Active Problem List   Diagnosis Date Noted  . Staphylococcus aureus bacteremia 06/30/2014  . Generalized weakness 06/23/2014  . Chronic diastolic congestive heart failure ([place]) 06/23/2014  . HTN (hypertension) 06/23/2014  . Prostate cancer ([place]) 06/23/2014  . Presence of suprapubic catheter ([place]) 06/17/2014  . Cervical myelopathy ([place])   . Arthritis   . Urinary incontinence, functional 10/25/2012  . Bilateral leg weakness 10/25/2012  . Fall at home 10/25/2012  . UTI (lower urinary tract infection) 10/25/2012  . Sleep apnea 10/25/2012  . Medically noncompliant 10/25/2012  . Obesity 10/25/2012    Past Surgical History:  Procedure Laterality Date  . ANTERIOR CERVICAL DECOMP/DISCECTOMY FUSION  05/2007   Archie Endo 02/16/2011  . BACK SURGERY  2008   cervical and thoracic decompression for  myelopathy  . PROSTATE SURGERY  03/2012   "shaved it down; didn't take it off"  . SHRAPNEL REMOVAL  1968   "HEAD, CHEST, LEFT HAND"  . SUPRAPUBIC CATHETER INSERTION    . THORACIC LAMINECTOMY  05/2007   Archie Endo 02/16/2011       Home Medications    Prior to Admission medications   Medication Sig Start Date End Date Taking? Authorizing Provider  Buchu-Junip-K Gluc-Pars-Uva Ur (WATER PILL/POTASSIUM) TABS Take 1 tablet by mouth daily.   Yes Historical Provider, MD  Calcium Carb-Cholecalciferol (CALCIUM 1000 + D PO) Take 1 tablet by mouth daily.   Yes Historical Provider, MD  Cholecalciferol (VITAMIN D3) 10000 units capsule Take 10,000 Units by mouth daily.   Yes Historical Provider, MD  Coenzyme Q10 (CO Q 10 PO) Take 600 mg by mouth daily.   Yes Historical Provider, MD  docusate sodium (COLACE) 100 MG capsule Take 100 mg by mouth 2 (two) times daily as needed for mild constipation.   Yes Historical Provider, MD  Glucosamine-Chondroitin (GLUCOSAMINE CHONDR COMPLEX PO) Take 600 mg by mouth daily.   Yes Historical Provider, MD  meloxicam (MOBIC) 7.5 MG tablet Take 7.5 mg by mouth daily. 04/11/14  Yes Historical Provider, MD  Pomegranate, Punica granatum, (POMEGRANATE PO) Take 1 tablet by mouth daily.   Yes Historical Provider, MD  traMADol (ULTRAM) 50 MG tablet Take 50 mg by mouth every 6 (six) hours as needed  for moderate pain.   Yes Historical Provider, MD  vitamin B-12 (CYANOCOBALAMIN) 1000 MCG tablet Take 2,000 mg by mouth daily.    Yes Historical Provider, MD  vitamin E 1000 UNIT capsule Take 1,000 Units by mouth 2 (two) times daily.   Yes Historical Provider, MD  cephALEXin (KEFLEX) 500 MG capsule Take 1 capsule (500 mg total) by mouth 3 (three) times daily. 01/20/17   Clayton Bibles, PA-C  Multiple Vitamins-Minerals (MULTIVITAMIN PO) Take 1 tablet by mouth daily.    Historical Provider, MD    Family History Family History  Problem Relation Age of Onset  . Heart disease Mother   . Dementia  Mother   . Heart disease Father   . Dementia Father     Social History Social History  Substance Use Topics  . Smoking status: Never Smoker  . Smokeless tobacco: Never Used     Comment: "smoked cigarettes  for ~ 1 wk in [place]"  . Alcohol use No     Allergies   Patient has no known allergies.   Review of Systems Review of Systems  All other systems reviewed and are negative.    Physical Exam Updated Vital Signs BP (!) 104/57 (BP Location: Left Arm)   Pulse 73   Temp 97.5 F (36.4 C) (Oral)   Resp 19   SpO2 94%   Physical Exam  Constitutional: He appears well-developed and well-nourished. No distress.  HENT:  Head: Normocephalic and atraumatic.  Neck: Neck supple.  Cardiovascular: Normal rate.   Pulmonary/Chest: Effort normal and breath sounds normal.  Abdominal: Soft. He exhibits no distension and no mass. There is no tenderness. There is no guarding.  Suprapubic catheter in place with thick red discharge around catheter.  No drainage into the tube.    Neurological: He is alert.  Skin: He is not diaphoretic.  Nursing note and vitals reviewed.    ED Treatments / Results  Labs (all labs ordered are listed, but only abnormal results are displayed) Labs Reviewed  BASIC METABOLIC PANEL - Abnormal; Notable for the following:       Result Value   Glucose, Bld 169 (*)    BUN 25 (*)    Creatinine, Ser 1.42 (*)    GFR calc non Af Amer 48 (*)    GFR calc Af Amer 55 (*)    All other components within normal limits  URINALYSIS, ROUTINE W REFLEX MICROSCOPIC - Abnormal; Notable for the following:    APPearance TURBID (*)    Hgb urine dipstick MODERATE (*)    Protein, ur 100 (*)    Leukocytes, UA MODERATE (*)    Bacteria, UA MANY (*)    All other components within normal limits  URINE CULTURE  CBC WITH DIFFERENTIAL/PLATELET    EKG  EKG Interpretation None       Radiology No results found.  Procedures BLADDER CATHETERIZATION Date/Time: 01/20/2017  4:22 PM Performed by: Clayton Bibles Authorized by: Clayton Bibles   Consent:    Consent obtained:  Verbal   Consent given by:  Patient Pre-procedure details:    Procedure purpose:  Therapeutic   Preparation: Patient was prepped and draped in usual sterile fashion   Anesthesia (see MAR for exact dosages):    Anesthesia method:  None Procedure details:    Catheter insertion:  Indwelling   Catheter type:  Foley   Catheter size:  24 Fr   Bladder irrigation: yes     Urine characteristics:  Cloudy Post-procedure details:  Patient tolerance of procedure:  Tolerated well, no immediate complications Comments:     Cloudy greenish yellow urine drained.  Irrigated by nurse.     (including critical care time)  Medications Ordered in ED Medications - No data to display   Initial Impression / Assessment and Plan / ED Course  I have reviewed the triage vital signs and the nursing notes.  Pertinent labs & imaging results that were available during my care of the patient were reviewed by me and considered in my medical decision making (see chart for details).     Afebrile, nontoxic patient with chronic foley catheter presenting with catheter obstruction.  Pt to be treated for UTI based on his description of symptoms and hx recurrent UTIs causing sepsis.  Catheter changed in ED by me with great improvement of symptoms.  Urine culture checked and keflex chosen for anticipated susceptibility.  Pt advised of the bump in his creatinine, advised to hydrate and follow up with PCP for recheck.   D/C home with keflex.  Discussed result, findings, treatment, and follow up  with patient.  Pt given return precautions.  Pt verbalizes understanding and agrees with plan.       Final Clinical Impressions(s) / ED Diagnoses   Final diagnoses:  Urinary tract infection associated with indwelling urethral catheter, initial encounter ([place])  Complication of Foley catheter, initial encounter [facility])  Renal insufficiency     New Prescriptions Discharge Medication List as of 01/20/2017  5:06 PM    START taking these medications   Details  cephALEXin (KEFLEX) 500 MG capsule Take 1 capsule (500 mg total) by mouth 3 (three) times daily., Starting Sat 01/20/2017, Print         [place], [place] 01/20/17 1738    Orlie Dakin, MD 01/21/17 [phone]

## 2017-01-20 NOTE — ED Notes (Signed)
Unable to get a reading on bladder scanner on Patient. Joaquin Music PA aware.

## 2017-01-20 NOTE — Discharge Instructions (Signed)
Read the information below.  Use the prescribed medication as directed.  Please discuss all new medications with your pharmacist.  You may return to the Emergency Department at any time for worsening condition or any new symptoms that concern you.   If you develop high fevers, worsening abdominal pain, uncontrolled vomiting, or are unable to tolerate fluids by mouth, return to the ER for a recheck.  ° °

## 2017-01-20 NOTE — ED Triage Notes (Signed)
Pt states his suprapubic catheter has been clogged for the past 4-5 hours. Pt has tried flushing the catheter with normal saline with no relief. Pt states he believes he still has a UTI that did not get better with antibiotics, diagnosed 3/11

## 2017-01-21 LAB — URINE CULTURE

## 2017-02-13 DIAGNOSIS — Z125 Encounter for screening for malignant neoplasm of prostate: Secondary | ICD-10-CM | POA: Diagnosis not present

## 2017-02-13 DIAGNOSIS — R601 Generalized edema: Secondary | ICD-10-CM | POA: Diagnosis not present

## 2017-02-13 DIAGNOSIS — Z79899 Other long term (current) drug therapy: Secondary | ICD-10-CM | POA: Diagnosis not present

## 2017-02-13 DIAGNOSIS — E785 Hyperlipidemia, unspecified: Secondary | ICD-10-CM | POA: Diagnosis not present

## 2017-02-13 DIAGNOSIS — M150 Primary generalized (osteo)arthritis: Secondary | ICD-10-CM | POA: Diagnosis not present

## 2017-02-13 DIAGNOSIS — N400 Enlarged prostate without lower urinary tract symptoms: Secondary | ICD-10-CM | POA: Diagnosis not present

## 2017-02-13 DIAGNOSIS — N289 Disorder of kidney and ureter, unspecified: Secondary | ICD-10-CM | POA: Diagnosis not present

## 2017-02-13 DIAGNOSIS — Z1211 Encounter for screening for malignant neoplasm of colon: Secondary | ICD-10-CM | POA: Diagnosis not present

## 2017-02-23 DIAGNOSIS — N312 Flaccid neuropathic bladder, not elsewhere classified: Secondary | ICD-10-CM | POA: Diagnosis not present

## 2017-03-30 DIAGNOSIS — N312 Flaccid neuropathic bladder, not elsewhere classified: Secondary | ICD-10-CM | POA: Diagnosis not present

## 2017-05-04 DIAGNOSIS — R8290 Unspecified abnormal findings in urine: Secondary | ICD-10-CM | POA: Diagnosis not present

## 2017-05-04 DIAGNOSIS — N390 Urinary tract infection, site not specified: Secondary | ICD-10-CM | POA: Diagnosis not present

## 2017-05-04 DIAGNOSIS — N312 Flaccid neuropathic bladder, not elsewhere classified: Secondary | ICD-10-CM | POA: Diagnosis not present

## 2017-05-08 DIAGNOSIS — N390 Urinary tract infection, site not specified: Secondary | ICD-10-CM | POA: Diagnosis not present

## 2017-06-08 DIAGNOSIS — N312 Flaccid neuropathic bladder, not elsewhere classified: Secondary | ICD-10-CM | POA: Diagnosis not present

## 2017-07-06 DIAGNOSIS — R319 Hematuria, unspecified: Secondary | ICD-10-CM | POA: Diagnosis not present

## 2017-07-06 DIAGNOSIS — N390 Urinary tract infection, site not specified: Secondary | ICD-10-CM | POA: Diagnosis not present

## 2017-07-06 DIAGNOSIS — N319 Neuromuscular dysfunction of bladder, unspecified: Secondary | ICD-10-CM | POA: Diagnosis not present

## 2017-07-31 DIAGNOSIS — N319 Neuromuscular dysfunction of bladder, unspecified: Secondary | ICD-10-CM | POA: Diagnosis not present

## 2017-08-15 DIAGNOSIS — Z9359 Other cystostomy status: Secondary | ICD-10-CM | POA: Diagnosis not present

## 2017-08-15 DIAGNOSIS — Z23 Encounter for immunization: Secondary | ICD-10-CM | POA: Diagnosis not present

## 2017-08-15 DIAGNOSIS — M150 Primary generalized (osteo)arthritis: Secondary | ICD-10-CM | POA: Diagnosis not present

## 2017-08-15 DIAGNOSIS — R739 Hyperglycemia, unspecified: Secondary | ICD-10-CM | POA: Diagnosis not present

## 2017-08-15 DIAGNOSIS — N289 Disorder of kidney and ureter, unspecified: Secondary | ICD-10-CM | POA: Diagnosis not present

## 2017-08-31 DIAGNOSIS — N319 Neuromuscular dysfunction of bladder, unspecified: Secondary | ICD-10-CM | POA: Diagnosis not present

## 2017-09-15 DIAGNOSIS — N390 Urinary tract infection, site not specified: Secondary | ICD-10-CM

## 2017-09-15 HISTORY — DX: Urinary tract infection, site not specified: N39.0

## 2017-09-17 ENCOUNTER — Emergency Department (HOSPITAL_COMMUNITY)
Admission: EM | Admit: 2017-09-17 | Discharge: 2017-09-18 | Disposition: A | Discharge status: Home / Self Care | Payer: Medicare Other | Source: Home / Self Care | Attending: Emergency Medicine | Admitting: Emergency Medicine

## 2017-09-17 ENCOUNTER — Emergency Department (HOSPITAL_COMMUNITY): Payer: Medicare Other

## 2017-09-17 ENCOUNTER — Encounter (HOSPITAL_COMMUNITY): Payer: Self-pay | Admitting: Emergency Medicine

## 2017-09-17 DIAGNOSIS — I110 Hypertensive heart disease with heart failure: Secondary | ICD-10-CM | POA: Insufficient documentation

## 2017-09-17 DIAGNOSIS — T83518A Infection and inflammatory reaction due to other urinary catheter, initial encounter: Secondary | ICD-10-CM | POA: Diagnosis not present

## 2017-09-17 DIAGNOSIS — M79606 Pain in leg, unspecified: Secondary | ICD-10-CM | POA: Diagnosis not present

## 2017-09-17 DIAGNOSIS — N179 Acute kidney failure, unspecified: Secondary | ICD-10-CM | POA: Diagnosis not present

## 2017-09-17 DIAGNOSIS — N3001 Acute cystitis with hematuria: Secondary | ICD-10-CM

## 2017-09-17 DIAGNOSIS — R404 Transient alteration of awareness: Secondary | ICD-10-CM | POA: Diagnosis not present

## 2017-09-17 DIAGNOSIS — I5032 Chronic diastolic (congestive) heart failure: Secondary | ICD-10-CM

## 2017-09-17 DIAGNOSIS — N390 Urinary tract infection, site not specified: Secondary | ICD-10-CM | POA: Diagnosis not present

## 2017-09-17 DIAGNOSIS — R531 Weakness: Secondary | ICD-10-CM | POA: Diagnosis not present

## 2017-09-17 DIAGNOSIS — R9431 Abnormal electrocardiogram [ECG] [EKG]: Secondary | ICD-10-CM | POA: Diagnosis not present

## 2017-09-17 DIAGNOSIS — N2889 Other specified disorders of kidney and ureter: Secondary | ICD-10-CM | POA: Diagnosis not present

## 2017-09-17 DIAGNOSIS — Y828 Other medical devices associated with adverse incidents: Secondary | ICD-10-CM | POA: Insufficient documentation

## 2017-09-17 DIAGNOSIS — M6282 Rhabdomyolysis: Secondary | ICD-10-CM | POA: Diagnosis not present

## 2017-09-17 DIAGNOSIS — Z8546 Personal history of malignant neoplasm of prostate: Secondary | ICD-10-CM | POA: Insufficient documentation

## 2017-09-17 DIAGNOSIS — Z79899 Other long term (current) drug therapy: Secondary | ICD-10-CM | POA: Insufficient documentation

## 2017-09-17 DIAGNOSIS — R918 Other nonspecific abnormal finding of lung field: Secondary | ICD-10-CM | POA: Diagnosis not present

## 2017-09-17 LAB — BASIC METABOLIC PANEL
ANION GAP: 9 (ref 5–15)
BUN: 17 mg/dL (ref 6–20)
CALCIUM: 9 mg/dL (ref 8.9–10.3)
CO2: 30 mmol/L (ref 22–32)
Chloride: 98 mmol/L — ABNORMAL LOW (ref 101–111)
Creatinine, Ser: 1.13 mg/dL (ref 0.61–1.24)
Glucose, Bld: 121 mg/dL — ABNORMAL HIGH (ref 65–99)
POTASSIUM: 3.6 mmol/L (ref 3.5–5.1)
Sodium: 137 mmol/L (ref 135–145)

## 2017-09-17 LAB — CBC WITH DIFFERENTIAL/PLATELET
BASOS ABS: 0 10*3/uL (ref 0.0–0.1)
BASOS PCT: 0 %
Eosinophils Absolute: 0.2 10*3/uL (ref 0.0–0.7)
Eosinophils Relative: 1 %
HEMATOCRIT: 38.7 % — AB (ref 39.0–52.0)
Hemoglobin: 12.8 g/dL — ABNORMAL LOW (ref 13.0–17.0)
LYMPHS PCT: 15 %
Lymphs Abs: 1.8 10*3/uL (ref 0.7–4.0)
MCH: 29.5 pg (ref 26.0–34.0)
MCHC: 33.1 g/dL (ref 30.0–36.0)
MCV: 89.2 fL (ref 78.0–100.0)
MONO ABS: 1.5 10*3/uL — AB (ref 0.1–1.0)
Monocytes Relative: 12 %
NEUTROS ABS: 8.4 10*3/uL — AB (ref 1.7–7.7)
Neutrophils Relative %: 72 %
PLATELETS: 250 10*3/uL (ref 150–400)
RBC: 4.34 MIL/uL (ref 4.22–5.81)
RDW: 14.5 % (ref 11.5–15.5)
WBC: 11.8 10*3/uL — AB (ref 4.0–10.5)

## 2017-09-17 LAB — URINALYSIS, ROUTINE W REFLEX MICROSCOPIC
BILIRUBIN URINE: NEGATIVE
GLUCOSE, UA: NEGATIVE mg/dL
Ketones, ur: NEGATIVE mg/dL
NITRITE: POSITIVE — AB
PH: 8 (ref 5.0–8.0)
Protein, ur: 100 mg/dL — AB
SPECIFIC GRAVITY, URINE: 1.013 (ref 1.005–1.030)
Squamous Epithelial / LPF: NONE SEEN

## 2017-09-17 LAB — I-STAT CG4 LACTIC ACID, ED: Lactic Acid, Venous: 0.83 mmol/L (ref 0.5–1.9)

## 2017-09-17 LAB — BRAIN NATRIURETIC PEPTIDE: B NATRIURETIC PEPTIDE 5: 35.6 pg/mL (ref 0.0–100.0)

## 2017-09-17 MED ORDER — FUROSEMIDE 20 MG PO TABS
20.0000 mg | ORAL_TABLET | Freq: Every day | ORAL | Status: DC
  Administered 2017-09-18: 20 mg via ORAL
  Filled 2017-09-17: qty 1

## 2017-09-17 MED ORDER — DOCUSATE SODIUM 100 MG PO CAPS: 100.0000 mg | ORAL_CAPSULE | Freq: Two times a day (BID) | ORAL | Status: DC | PRN

## 2017-09-17 NOTE — ED Notes (Signed)
Per EMS-states patient might need a SW consult due to not being able to care for self-frequent falls

## 2017-09-17 NOTE — ED Notes (Signed)
ED Provider at bedside. 

## 2017-09-17 NOTE — ED Notes (Signed)
Pt was not able to stand at bedside with walker and 2 staff members available for help. Pt had to be placed back in bed with help.

## 2017-09-17 NOTE — ED Notes (Signed)
Patient given sandwich.  

## 2017-09-17 NOTE — Discharge Instructions (Addendum)
As discussed, drink plenty of fluids and follow up with Dr. Venia Minks in office. He recommends avoiding antibiotics at this time and wants you to see him in clinic.  Return if worsening or new concerning symptoms in the meantime.

## 2017-09-17 NOTE — ED Provider Notes (Signed)
[facility] DEPT Provider Note   CSN: [id number] Arrival date & time: 09/17/17  1314     History   Chief Complaint Chief Complaint  Patient presents with  . Urinary Complaint    HPI Jerry [name] is a 71 y.o. male with suprapubic catheter and history of recurrent urinary tract infections presenting with obstructing catheter, poor flow, odor and dark colored urine. Reports that this started 2 days ago. he has attempted to flush the catheter but notes that it is due to be replaced this week and recognized these symptoms as UTI. Denies any fever, chills, nausea, vomiting, low back pain.  He reports that he has bilateral lower extremity edema which is at his baseline as he has been drinking a lot of water to help with his urine.  He denies any chest pain, shortness of breath.  He also reports a recent difficulty getting out of his chair and as he attempted to stand up he fell back into the chair and had to call 911 for assistance yesterday and today. Yesterday he was able to ambulate after they helped him out of his chair and was fine for the rest of the day. Today he slipped while they were helping him up and pulled his thigh muscle. He denies injuring himself, no knee pain, but has ache to his anterior thigh. He denies any pain or injury from this.  Patient is not interested in home health at this time.  He typically sees a urologist in [facility] but has had to come to the emergency department on weekends to have his suprapubic catheter replaced.   HPI  Past Medical History:  Diagnosis Date  . Arthritis    "hands, knees, back, legs" (06/10/2014)  . Cervical myelopathy ([place])   . Coma ([place]) 1968   "for 2 wks"  . EHUDJSHF(026.3)    "monthly" (06/10/2014)  . Malaria   . Prostate cancer ([place])   . PTSD (post-traumatic stress disorder)    "I'm all over that now"    Patient Active Problem List   Diagnosis Date Noted  . Staphylococcus aureus bacteremia  06/30/2014  . Generalized weakness 06/23/2014  . Chronic diastolic congestive heart failure ([place]) 06/23/2014  . HTN (hypertension) 06/23/2014  . Prostate cancer ([place]) 06/23/2014  . Presence of suprapubic catheter ([place]) 06/17/2014  . Cervical myelopathy ([place])   . Arthritis   . Urinary incontinence, functional 10/25/2012  . Bilateral leg weakness 10/25/2012  . Fall at home 10/25/2012  . UTI (lower urinary tract infection) 10/25/2012  . Sleep apnea 10/25/2012  . Medically noncompliant 10/25/2012  . Obesity 10/25/2012    Past Surgical History:  Procedure Laterality Date  . ANTERIOR CERVICAL DECOMP/DISCECTOMY FUSION  05/2007   Archie Endo 02/16/2011  . BACK SURGERY  2008   cervical and thoracic decompression for myelopathy  . PROSTATE SURGERY  03/2012   "shaved it down; didn't take it off"  . SHRAPNEL REMOVAL  1968   "HEAD, CHEST, LEFT HAND"  . SUPRAPUBIC CATHETER INSERTION    . THORACIC LAMINECTOMY  05/2007   Archie Endo 02/16/2011       Home Medications    Prior to Admission medications   Medication Sig Start Date End Date Taking? Authorizing Provider  CALCIUM PO Take 1,000 mg by mouth daily.   Yes [provider]  Cholecalciferol (VITAMIN D3) 10000 units capsule Take 10,000 Units by mouth daily.   Yes [provider]  Coenzyme Q10 (CO Q 10 PO) Take 600 mg by  mouth daily.   Yes [provider]  docusate sodium (COLACE) 100 MG capsule Take 100 mg by mouth 2 (two) times daily as needed for mild constipation.   Yes [provider]  furosemide (LASIX) 20 MG tablet TK 1 T PO QD 08/10/17  Yes [provider]  Glucosamine-Chondroitin (GLUCOSAMINE CHONDR COMPLEX PO) Take 600 mg by mouth daily.   Yes [provider]  meloxicam (MOBIC) 7.5 MG tablet Take 7.5 mg by mouth daily. 04/11/14  Yes [provider]  Multiple Vitamins-Minerals (MULTIVITAMIN PO) Take 1 tablet by mouth daily.   Yes [provider]  naproxen sodium (ALEVE)  220 MG tablet Take 440 mg by mouth daily as needed (pain).   Yes [provider]  RESVERATROL PO Take 600 mg by mouth daily.   Yes [provider]  traMADol (ULTRAM) 50 MG tablet Take 50 mg by mouth every 6 (six) hours as needed for moderate pain.   Yes [provider]  vitamin B-12 (CYANOCOBALAMIN) 1000 MCG tablet Take 2,000 mg by mouth daily.    Yes [provider]  vitamin E 1000 UNIT capsule Take 1,000 Units by mouth daily.    Yes [provider]  Buchu-Junip-K Gluc-Pars-Uva Ur (WATER PILL/POTASSIUM) TABS Take 1 tablet by mouth daily.    [provider]  Calcium Carb-Cholecalciferol (CALCIUM 1000 + D PO) Take 1 tablet by mouth daily.    [provider]  cephALEXin (KEFLEX) 500 MG capsule Take 1 capsule (500 mg total) by mouth 3 (three) times daily. Patient not taking: Reported on 09/17/2017 01/20/17   Clayton Bibles, PA-C  Pomegranate, Punica granatum, (POMEGRANATE PO) Take 1 tablet by mouth daily.    [provider]    Family History Family History  Problem Relation Age of Onset  . Heart disease Mother   . Dementia Mother   . Heart disease Father   . Dementia Father     Social History Social History   Tobacco Use  . Smoking status: Never Smoker  . Smokeless tobacco: Never Used  . Tobacco comment: "smoked cigarettes  for ~ 1 wk in [place]"  Substance Use Topics  . Alcohol use: No  . Drug use: No     Allergies   Patient has no known allergies.   Review of Systems Review of Systems  Constitutional: Negative for chills and fever.  Respiratory: Negative for cough, chest tightness, shortness of breath, wheezing and stridor.   Cardiovascular: Positive for leg swelling. Negative for chest pain and palpitations.  Gastrointestinal: Negative for abdominal distention, abdominal pain, nausea and vomiting.  Genitourinary: Positive for dysuria. Negative for decreased urine volume, difficulty urinating, flank pain,  frequency and hematuria.  Musculoskeletal: Negative for back pain and myalgias.  Skin: Negative for color change, pallor and rash.  Neurological: Negative for dizziness, seizures, syncope and light-headedness.     Physical Exam Updated Vital Signs BP (!) 115/50 (BP Location: Right Arm)   Pulse 72   Temp 98 F (36.7 C) (Oral)   Resp 18   SpO2 94%   Physical Exam  Constitutional: He appears well-developed and well-nourished. No distress.  Afebrile, nontoxic appearing, sitting comfortably in bed no acute distress.  HENT:  Head: Normocephalic and atraumatic.  Eyes: Conjunctivae and EOM are normal.  Neck: Normal range of motion.  Cardiovascular: Normal rate, regular rhythm, normal heart sounds and intact distal pulses.  No murmur heard. Pulmonary/Chest: Effort normal and breath sounds normal. No respiratory distress.  Abdominal: Soft. He  exhibits no distension. There is no tenderness. There is no guarding.  Genitourinary:  Genitourinary Comments: Thick dark yellow urine in tubing and bag.  No warmth or signs of infection surrounding suprapubic catheter entry.  Musculoskeletal: Normal range of motion. He exhibits edema.  She has bilateral edema at baseline he reports that this is nothing new for him as he has increase his water intake due to urinary symptoms.  Is currently on Lasix  Neurological: He is alert.  Skin: Skin is warm and dry. No rash noted. He is not diaphoretic. No erythema. No pallor.  Psychiatric: He has a normal mood and affect.  Nursing note and vitals reviewed.    ED Treatments / Results  Labs (all labs ordered are listed, but only abnormal results are displayed) Labs Reviewed  URINALYSIS, ROUTINE W REFLEX MICROSCOPIC - Abnormal; Notable for the following components:      Result Value   Color, Urine AMBER (*)    APPearance TURBID (*)    Hgb urine dipstick LARGE (*)    Protein, ur 100 (*)    Nitrite POSITIVE (*)    Leukocytes, UA LARGE (*)    Bacteria, UA  MANY (*)    Non Squamous Epithelial 0-5 (*)    All other components within normal limits  CBC WITH DIFFERENTIAL/PLATELET - Abnormal; Notable for the following components:   WBC 11.8 (*)    Hemoglobin 12.8 (*)    HCT 38.7 (*)    Neutro Abs 8.4 (*)    Monocytes Absolute 1.5 (*)    All other components within normal limits  BASIC METABOLIC PANEL - Abnormal; Notable for the following components:   Chloride 98 (*)    Glucose, Bld 121 (*)    All other components within normal limits  BRAIN NATRIURETIC PEPTIDE  I-STAT CG4 LACTIC ACID, ED    EKG  EKG Interpretation  Date/Time:  Monday September 17 2017 13:54:57 EST Ventricular Rate:  68 PR Interval:    QRS Duration: 106 QT Interval:  415 QTC Calculation: 442 R Axis:   -45 Text Interpretation:  Sinus rhythm Ventricular premature complex Short PR interval Incomplete RBBB and LAFB Abnormal R-wave progression, late transition Baseline wander in lead(s) V3 Confirmed by Sherwood Gambler [id number]) on 09/17/2017 3:56:20 PM       Radiology Dg Chest 2 View  Result Date: 09/17/2017 CLINICAL DATA:  Lower extremity edema, fatigue, UTI, have suprapubic catheter, fell this morning, history prostate cancer EXAM: CHEST  2 VIEW COMPARISON:  06/10/2014 FINDINGS: Normal heart size, mediastinal contours, and pulmonary vascularity. Chronic marked elevation of RIGHT diaphragm. Lungs otherwise clear. No pleural effusion or pneumothorax. Prior cervical spine fusion. IMPRESSION: Chronic elevation of RIGHT diaphragm. Otherwise negative exam. Electronically Signed   By: Lavonia Dana M.D.   On: 09/17/2017 17:15    Procedures SUPRAPUBIC TUBE PLACEMENT Date/Time: 09/18/2017 12:20 AM Performed by: Emeline General, PA-C Authorized by: Emeline General, PA-C   Consent:    Consent obtained:  Verbal   Consent given by:  Patient   Risks discussed:  Bleeding Anesthesia (see MAR for exact dosages):    Anesthesia method:  None Procedure details:    Complexity:   Simple   Catheter type:  Foley   Ultrasound guidance: no     Urine characteristics:  Clear and yellow Post-procedure details:    Patient tolerance of procedure:  Tolerated well, no immediate complications   (including critical care time)    Medications Ordered in ED Medications  docusate sodium (COLACE)  capsule 100 mg (not administered)  furosemide (LASIX) tablet 20 mg (not administered)     Initial Impression / Assessment and Plan / ED Course  I have reviewed the triage vital signs and the nursing notes.  Pertinent labs & imaging results that were available during my care of the patient were reviewed by me and considered in my medical decision making (see chart for details).    Patient presenting with concerns for UTI with suprapubic catheter clogging. Urine is dark yellow and appears thick and malodorous  Patient is due to have his catheter replaced this week  Patient would prefer to have it replaced in the ED as he reports difficulties with transport and getting to the urology office.  Patient had reported to me that he has been more fatigued lately but that it happens every time he eats poorly and LE edema has been his baseline when increasing fluid intake.  Denies any c/p, sob or other sxs.  He later reported concerns to his nurse that he has been weak and wanted workup.  Due to hx of CHF, increased Le edema, ordered CXR, BNP, and basic labs.  Nurse reported some difficulties with flushing cath. Will replace in ED.   Suprapubic cath was replaced by me in the ED  Spoke with Dr. Venia Minks Urology office and he recommends drinking plenty of fluids, no antibiotics and following up with him in office.  Patient explains that all his symptoms are the same when he has a UTI and clogged catheter.  When getting ready for discharge, patient reported that he doesn't know if he can walk as he has been having difficulties standing up and putting weight on his left leg and this was  his main concern when coming here.  I had offered patient to involve case management so that we may help him get assistance at home but He had declined. He now states that he doesn't want me to talk to case management until he attempts to walk using his walker here in the ED.  Patient was unable to ambulate using his walker in ED. Case management spoke to patient and he would like to consider placement. Social work is not Electrical engineer and will be seeing him in the am to coordinate with VA benefits for placement.  Patient care transferred at end of shift to [facility], PA-C pending PT eval, social work and case management coordination for disposition.   Final Clinical Impressions(s) / ED Diagnoses   Final diagnoses:  Acute cystitis with hematuria    ED Discharge Orders    None       Dossie Der 09/18/17 0021    Lacretia Leigh, MD 09/18/17 [phone]

## 2017-09-17 NOTE — ED Triage Notes (Signed)
Per [facility] pt from home lives alone c/o suprapubic catheter issues. Patient concerned for UTI due to catheter becoming clogged and dark urine. Pt also had fall this am. Fire came and assisted pt into chair. Pt denies any c/o from fall. Pt lower legs also appear swollen.

## 2017-09-17 NOTE — ED Notes (Signed)
Suprapubic catheter irrigated with 30CC sterile water. No urine returned. Pt denies any pain. PA notified.

## 2017-09-17 NOTE — ED Notes (Signed)
Bed: [facility]37 Expected date:  Expected time:  Means of arrival:  Comments: For 7

## 2017-09-17 NOTE — ED Notes (Signed)
Bed: [facility] Expected date:  Expected time:  Means of arrival:  Comments: 

## 2017-09-18 DIAGNOSIS — N3091 Cystitis, unspecified with hematuria: Secondary | ICD-10-CM | POA: Diagnosis not present

## 2017-09-18 DIAGNOSIS — B999 Unspecified infectious disease: Secondary | ICD-10-CM | POA: Diagnosis not present

## 2017-09-18 NOTE — Progress Notes (Signed)
Pt has left buttock wound with erythema and sanguinous exudate. Pt reports wound is not painful and he is unclear when it started draining.

## 2017-09-18 NOTE — ED Notes (Signed)
[facility] called for transport home. 

## 2017-09-18 NOTE — Progress Notes (Signed)
   09/18/17 0917  PT Time Calculation  PT Start Time (ACUTE ONLY) 8546  PT Stop Time (ACUTE ONLY) 0906  PT Time Calculation (min) (ACUTE ONLY) 38 min  PT G-Codes **NOT FOR INPATIENT CLASS**  Functional Assessment Tool Used AM-PAC 6 Clicks Basic Mobility;Clinical judgement  Functional Limitation Mobility: Walking and moving around  Mobility: Walking and Moving Around Current Status (E7035) CN  Mobility: Walking and Moving Around Goal Status (K0938) CI  PT General Charges  $$ ACUTE PT VISIT 1 Visit  PT Evaluation  $PT Eval Moderate Complexity 1 Mod  PT Treatments  $Therapeutic Activity 23-37 mins  [place] PT [phone]

## 2017-09-18 NOTE — Progress Notes (Signed)
CSW spoke with pt about VA benefits. PT stated that he has never received VA benefits before. Pt said that he has never been to the [place] to receive resources. Pt said he has not been assigned a number by the [place]. 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   Wendelyn Breslow, [facility]  [phone]

## 2017-09-18 NOTE — ED Provider Notes (Signed)
  Physical Exam  BP (!) 113/53 (BP Location: Right Arm)   Pulse 63   Temp 98.1 F (36.7 C) (Oral)   Resp 20   SpO2 92%   Physical Exam  ED Course/Procedures     Procedures  MDM  Patient was seen last night for leg swelling and clotted catheter. Catheter replaced. BNP and labs unremarkable. Had trouble walking and PT consulted and recommend rehab. He has no insurance and social work and case management were involved and was able to get him home health and physical therapy and will continue to work on placement outpatient.    Drenda Freeze, MD 09/18/17 [phone]

## 2017-09-18 NOTE — Care Management Note (Signed)
Case Management Note  Patient Details  Name: Jerry [name] MRN: [id number] Date of Birth: December 07, 1945  Subjective/Objective:                  71 y.o. male with suprapubic catheter and history of recurrent urinary tract infections presenting with obstructing catheter, poor flow, odor and dark colored urine.   Action/Plan: CM consulted for HHS.  Pt from home alone.  Pt has had increasing weakness over the last 2 days with increasing falls and has had to call fire to his home for assistance.  Pt uses a walker at home.  CM watched pt attempt to ambulate and was unable to sit up on the edge of the bed on his own; it required 2 person assistance.  Pt was additionally unable to lift his foot off the floor high enough for a slipper to slide back underneath it.  CM advised pt that he could go home with HHS, his choice was [facility] from previous use, and a private aid, but that he was more appropriate for SNF rehab placement due to the time of day and that pt is unable to perform any of his ADLs at this time.  Pt does not have a 3 night qualifying stay based on medicare but pt would qualify for VA benefits due to combat service and additionally has a supplemental insurance which may have SNF benefits.  CM advised Alroy Dust, [place] to place an order for PT eval and consult for CSW for placement.  No further CM needs noted at this time.  CM available for future needs that arise but none noted at this time.

## 2017-09-18 NOTE — Evaluation (Addendum)
Physical Therapy Evaluation Patient Details Name: Jerry [name] MRN: [id number] DOB: [birth year] Today's Date: 09/18/2017   History of Present Illness  Evrett Hakim is a 71 y.o. male with suprapubic catheter and history of recurrent urinary tract infections presenting with obstructing catheter, poor flow, odor and dark colored urine, falls  and inability to ambulate.   Clinical Impression  The patient is requiring total assist of 2 persons for bed  mobility and for attempts to stand, which he was unable to accomplish. The patient lives alone. Pt admitted with above diagnosis. Pt currently with functional limitations due to the deficits listed below (see PT Problem List).  Pt will benefit from skilled PT to increase their independence and safety with mobility to allow discharge to the venue listed below.   THE PATIENT REPORTS THAT HIS LEFT KNEE MAXIMALLY FLEXED DURING A FALL AND IS NOW COMPLAINING OF SUPRA PATELLA PAIN, UNABLE TO BEAR WEIGHT.     Follow Up Recommendations SNF;Supervision/Assistance - 24 hour    Equipment Recommendations  Wheelchair (measurements PT)    Recommendations for Other Services       Precautions / Restrictions Precautions Precautions: Fall Precaution Comments: suprapubic catheter., legs very weak.      Mobility  Bed Mobility Overal bed mobility: Needs Assistance Bed Mobility: Rolling;Sit to Sidelying;Sidelying to Sit Rolling: Total assist;+2 for physical assistance;+2 for safety/equipment Sidelying to sit: Total assist;+2 for physical assistance;+2 for safety/equipment     Sit to sidelying: Total assist;+2 for physical assistance;+2 for safety/equipment General bed mobility comments: patient able to use UE's to assist with rolling but requires total assist to move the legs during rolling.  Total assist  with legs and trunk to sit on the bed edge.  total assist with trunk and legs to go to side.   Transfers Overall transfer level: Needs  assistance Equipment used: Rolling walker (2 wheeled) Transfers: Sit to/from Stand Sit to Stand: Total assist;+2 physical assistance;+2 safety/equipment;From elevated surface         General transfer comment: attempted x 1 to stand at [facility], stood partially with  no cl;earance of buttocks from bed, knees flexed and decrteased weight tolerated on the left leg with reports of pain.  Ambulation/Gait             General Gait Details: unable  Stairs            Wheelchair Mobility    Modified Rankin (Stroke Patients Only)       Balance Overall balance assessment: History of Falls;Needs assistance Sitting-balance support: Bilateral upper extremity supported;Feet supported Sitting balance-Leahy Scale: Fair Sitting balance - Comments: initailly posterior lean, gradually gained static balance. Postural control: Posterior lean                                   Pertinent Vitals/Pain Pain Assessment: Faces Faces Pain Scale: Hurts whole lot Pain Location: left knee when flexed and attempts to WB. Pain Descriptors / Indicators: Discomfort;Grimacing;Guarding;Penetrating Pain Intervention(s): Limited activity within patient's tolerance;Monitored during session    Home Living Family/patient expects to be discharged to:: Private residence Living Arrangements: Alone Available Help at Discharge: Neighbor;Available PRN/intermittently Type of Home: House Home Access: Stairs to enter   Entrance Stairs-Number of Steps: 1 Home Layout: Two level Home Equipment: Walker - 2 wheels;Walker - 4 wheels;Shower seat      Prior Function Level of Independence: Independent with assistive device(s)  Comments: ambulates w/ RW     Hand Dominance        Extremity/Trunk Assessment   Upper Extremity Assessment Upper Extremity Assessment: Generalized weakness    Lower Extremity Assessment Lower Extremity Assessment: LLE deficits/detail;RLE deficits/detail RLE  Deficits / Details: able to bear partial weight with standing LLE Deficits / Details: unable to perform SLR related to pain and weakness. noted edema about the knee and posterior thigh.       Communication   Communication: No difficulties  Cognition Arousal/Alertness: Awake/alert Behavior During Therapy: WFL for tasks assessed/performed Overall Cognitive Status: Within Functional Limits for tasks assessed                                        General Comments      Exercises     Assessment/Plan    PT Assessment Patient needs continued PT services  PT Problem List Decreased strength;Decreased range of motion;Decreased activity tolerance;Decreased balance;Decreased mobility;Decreased knowledge of precautions;Decreased safety awareness;Decreased knowledge of use of DME       PT Treatment Interventions DME instruction;Gait training;Functional mobility training;Therapeutic activities;Therapeutic exercise;Patient/family education    PT Goals (Current goals can be found in the Care Plan section)  Acute Rehab PT Goals Patient Stated Goal: to walk again PT Goal Formulation: With patient Time For Goal Achievement: 10/02/17 Potential to Achieve Goals: Fair    Frequency Min 2X/week   Barriers to discharge Decreased caregiver support;Inaccessible home environment      Co-evaluation               AM-PAC PT "6 Clicks" Daily Activity  Outcome Measure Difficulty turning over in bed (including adjusting bedclothes, sheets and blankets)?: Unable Difficulty moving from lying on back to sitting on the side of the bed? : Unable Difficulty sitting down on and standing up from a chair with arms (e.g., wheelchair, bedside commode, etc,.)?: Unable Help needed moving to and from a bed to chair (including a wheelchair)?: Total Help needed walking in hospital room?: Total Help needed climbing 3-5 steps with a railing? : Total 6 Click Score: 6    End of Session    Activity Tolerance: Patient limited by pain Patient left: in bed;with call bell/phone within reach Nurse Communication: Mobility status;Need for lift equipment PT Visit Diagnosis: Difficulty in walking, not elsewhere classified (R26.2);Pain Pain - Right/Left: Left Pain - part of body: Knee    Time: 0828-0906 PT Time Calculation (min) (ACUTE ONLY): 38 min   Charges:   PT Evaluation $PT Eval Moderate Complexity: 1 Mod PT Treatments $Therapeutic Activity: 23-37 mins   PT G CodesTresa Endo PT [phone]   Claretha Cooper 09/18/2017, 9:19 AM

## 2017-09-18 NOTE — Clinical Social Work Note (Addendum)
UPDATE 12/6: CSW spoke with leadership regarding potential APS report. Due to patient have financial resources and declining to pay privately for SNF placement at this time- APS report would not be appropriate.  Clinical Social Work Assessment  Patient Details  Name: Jerry [name] MRN: [id number] Date of Birth: 11-14-1945  Date of referral:  09/18/17               Reason for consult:  Facility Placement                Permission sought to share information with:    Permission granted to share information::     Name::        Agency::     Relationship::     Contact Information:     Housing/Transportation Living arrangements for the past 2 months:  Single Family Home Source of Information:  Patient Patient Interpreter Needed:  None Criminal Activity/Legal Involvement Pertinent to Current Situation/Hospitalization:    Significant Relationships:    Lives with:  Self Do you feel safe going back to the place where you live?    Need for family participation in patient care:     Care giving concerns:  Patient unable to ambulate at this time and requesting placement to SNF facility.    Social Worker assessment / plan:  CSW met with patient via bedside to discuss discharge plans. Patient was seen by PT this am who recommended SNF. Patient has Medicare and [employer] as insurance and is a English as a second language teacher however patient has never received services from the [place] and would not like to discharge to a [place] facility.   Patient has concerns with discharging home at this time due to not being able to put weight on his leg. Patient provided CSW with insurance cardChristena Flake)- CSW confirmed with customer service that patient is able to use 21-100 skilled nursing days. CSW will seek placement at this time and continue to update patient.   Employment status:  Retired Forensic scientist:  Medicare PT Recommendations:  [place] / Referral to community resources:  [place]  Patient/Family's Response to care:  Patient appreciated CSW   Patient/Family's Understanding of and Emotional Response to Diagnosis, Current Treatment, and Prognosis:  Understands current diagnosis and treatment.   Emotional Assessment Appearance:  Appears stated age Attitude/Demeanor/Rapport:    Affect (typically observed):  Anxious, Accepting, Pleasant Orientation:  Oriented to Self, Oriented to Place, Oriented to  Time, Oriented to Situation Alcohol / Substance use:    Psych involvement (Current and /or in the community):  No (Comment)  Discharge Needs  Concerns to be addressed:  Discharge Planning Concerns Readmission within the last 30 days:  No Current discharge risk:  None Barriers to Discharge:  No SNF bed   Weston Anna, LCSW 09/18/2017, 1:54 PM

## 2017-09-18 NOTE — Care Management Note (Signed)
Case Management Note  Patient Details  Name: Jerry [name] MRN: [id number] Date of Birth: 01-22-1946  CM advised by CSW that pt has never connected with his VA benefits and does not have access to anything at this time.  Application and acceptance could take several weeks.  CM contacted [place] as per conversation with pt last night for referral.  Advised pt will need significant community resources, especially with [facility] SW.  Dr. Darl Householder, CSW and CM spoke with pt and advised him of the situation.  Pt agreeable and understandable.  Pt will be transported by [facility], updated primary RN.  No further CM needs noted at this time.  Expected Discharge Date:   09/18/2017               Expected Discharge Plan:  [place]  In-House Referral:  Clinical Social Work  Discharge planning Services  CM Consult  Post Acute Care Choice:  Home Health Choice offered to:  Patient  HH Arranged:  Social Work, Therapist, sports, PT, OT, Nurse's Aide [place] Agency:  [place]  Status of Service:  Completed, signed off  Rae Mar, RN 09/18/2017, 5:40 PM

## 2017-09-19 NOTE — ED Notes (Signed)
Mr Slaymaker call today and stated no on from [facility] came to his house today and asked if I could contact them and and have someone call him, I contacted [facility] and give them his number to follow up with him. Pauline Good

## 2017-09-20 ENCOUNTER — Other Ambulatory Visit: Payer: Self-pay | Admitting: *Deleted

## 2017-09-20 ENCOUNTER — Telehealth: Payer: Self-pay | Admitting: Emergency Medicine

## 2017-09-20 NOTE — Patient Outreach (Signed)
Duplicate note   93/7/16   Received call from [place], [place] who states [facility] did not open up Mr. Skelly case. [facility] actually is not following patient any longer. Advised Alice to contact  Telephonic RNCM who is actually following the patient.  Confirmed with [name], [facility] hospital liaison that the  [facility] staff nurse filed an APS report based what they witnessed last evening in patient's home. [facility] reports patient had no food and was sitting in chair in stool. Therefore, APS report was filed and [facility] did not open Mr. Barrell to services.  Made [facility] Care Management team aware. Also made ED [place] Lev[facility] Dy) aware.   Marthenia Rolling, MSN-Ed, RN,BSN [facility] Liaison [phone]

## 2017-09-20 NOTE — Telephone Encounter (Signed)
Of note.  CM was advised yesterday by ED secretary that pt had called twice.  ED secretary called [facility] after one call and called CM after the second call.  At that time CM contacted [name] with [facility] to reach out to her team in the community to assist pt.  CM received a transfer call today by pt stating that he is not able to stay at home by himself, "it's just not working out."  CM reminded him that CSW, CM, and Dr. Darl Householder all spoke with him at length, CM and CSW multiple times, about his options prior to transition home.  Pt reports that 2 different people from [facility] have been to see him yesterday.  CM again stated that pt would need to pay privately for additional assistance other than what is available through HHS, which pt still does not want to do.  Pt reports that he was never seen by a doctor and there must be a reason why he is not able to walk.  CM advised pt that Dr. Darl Householder throughly reviewed his chart and spoke with him at time of D/C.  Pt states he is coming back to the hospital but will be going somewhere else.  CM advised him that the outcome would likely still be the same.  CM contacted [name] at [facility] ED if pt arrives there and updated her on situation.  CM contacted [name] with [facility] so she could attempt to have the pt's [facility] team contact him prior to returning to the ED.  [name] advised that their [facility] CSW referred the pt to [facility] per e-mail review.  CM contacted [name] with [facility] about pt who will attempt to reach out the right people to speak with pt.  CM will continue to follow as needed.

## 2017-09-20 NOTE — Consult Note (Signed)
Received call from [place] [facility] requesting [place] Management assistance on Mr. Hippler.   [facility] ED RNCM indicates Mr. Dalby called her to say he was coming back to the ED. Apparently he wants SNF placement but has not qualified for it. He is not connected with the [place] either. He is active with [facility] who is following patient.   Mr. Dinovo was just recently assigned to [place].  Appears patient lives alone and unable to care for himself at home.  Sent notification to [place] Management team to alert of telephone call from ED RNCM.   Marthenia Rolling, MSN-Ed, RN,BSN [facility] Liaison [phone]

## 2017-09-20 NOTE — Patient Outreach (Signed)
[facility]) Care Management  09/20/2017  Jerry [name] September 20, 1946 [id number]  Telephone Screen  Referral Date: 09/20/17 Referral Source: Ronny Bacon ([place], [facility] Referral Reason: Recent d/c from [facility] Emergency Department [facility]). Patient has no caregiver at home to help. Patient can't walk or stand by himself. Recent fall. Insurance: Medicare  Outreach attempt # 1 to patient at 1235. HIPAA identifiers verified with patient. Patient has a past medical history of falls, UTI, HTN, Arthritis, Cervical Myelopathy, Malaria, Prostate Cancer, PTSD, CHF, HTN, Suprapubic Catheter, and Obesity. Patient stated, he may call the ambulance service to be transported to the hospital. Patient had a fall on 09/18/17 with injury. Patient went to [facility], where he was evaluated and diagnosed with a UTI. Patient stated, he was having difficulty with mobility prior to being discharged from [facility]. He spoke about his needs/problems were not being addressed during the visit. Patient was transported home by ambulance. Patient stated, he hasn't been able to ambulate since the fall. He lives alone and he has no significant caregiver. Patient stated, he hadn't taken his medications or eaten due to being immobile. Per patient, 2 nurses from [facility] came to visit him. He stated, the nurses were able to get him in a chair. He stated, the nurses cleaned up his sacral wounds and placed a "make shift diaper on him". Patient called [facility] to make the staff aware he would be coming back to the emergency department. Patient stated, he wanted to be placed in a facility for rehab. Per patient, "he was told there may not be anything different from his last ED visit". Patient was hesitant to return to the [facility] and asked about going to [place] CM informed patient, she would call him back after speaking with leadership about his options for medical care.   Updated @ 1318: RN CM contacted patient after  speaking with leadership. Patient encouraged to contact the ambulance service to be transported to [facility] emergency department. Patient plans to call RN CM back if he decides to go to the emergency room.   Updated @ 1201: RN CM received a voicemail from [place], [place] with [facility] regarding patient. She stated, patient is immobile and unsafe at home alone.   Updated @ 1423: RN CM received a telephone call from [place], [place] at [place] patient is not safe in his home. She stated, patient is immobile and unable to care for himself. RN CM informed D[place] Clap about speaking with patient and encouraging him to be transported by ambulance to [place] plans to call patient back to encourage patient to go to the emergency room.     Plan: RN CM will send referral to [facility] RN for further in home eval/assessment of care needs and management of chronic conditions. RN CM will send [facility] SW referral for possible assistance with community resources related to safety in the home and personal care worker. RN CM advised patient to contact RNCM for any needs or concerns.   Lake Bells, RN, BSN, MHA/MSL, [place] Telephonic Care Manager Coordinator [facility] Direct Phone: [phone] Toll Free: [phone] Fax: [phone]

## 2017-09-20 NOTE — Consult Note (Signed)
   [facility] Inpatient Consult   09/20/2017  Jerry [name] 19-Mar-1946 [id number]    Received call from [place], [place] navigator who states [facility] did not open up [name] case. [facility] actually is not following patient any longer. Advised Alice to contact  Telephonic RNCM who is actually following the patient.  Confirmed with [name], [facility] hospital liaison that the  [facility] staff nurse filed an APS report based what they witnessed last evening in patient's home. [facility] reports patient had no food and was sitting in chair in stool. Therefore, APS report was filed and [facility] did not open [name] to services.  Made [facility] Care Management team aware. Also made ED [place] Levada Dy) aware.    Marthenia Rolling, MSN-Ed, RN,BSN [facility] Liaison [phone]

## 2017-09-21 ENCOUNTER — Emergency Department (HOSPITAL_COMMUNITY): Payer: Medicare Other

## 2017-09-21 ENCOUNTER — Inpatient Hospital Stay (HOSPITAL_COMMUNITY)
Admission: EM | Admit: 2017-09-21 | Discharge: 2017-09-26 | DRG: 699 | Disposition: A | Discharge status: Skilled Nursing Facility | Payer: Medicare Other | Attending: Internal Medicine | Admitting: Internal Medicine

## 2017-09-21 ENCOUNTER — Other Ambulatory Visit: Payer: Self-pay

## 2017-09-21 ENCOUNTER — Encounter (HOSPITAL_COMMUNITY): Payer: Self-pay | Admitting: Internal Medicine

## 2017-09-21 DIAGNOSIS — I5032 Chronic diastolic (congestive) heart failure: Secondary | ICD-10-CM | POA: Diagnosis present

## 2017-09-21 DIAGNOSIS — Z9359 Other cystostomy status: Secondary | ICD-10-CM | POA: Diagnosis not present

## 2017-09-21 DIAGNOSIS — Z9181 History of falling: Secondary | ICD-10-CM | POA: Diagnosis not present

## 2017-09-21 DIAGNOSIS — I10 Essential (primary) hypertension: Secondary | ICD-10-CM | POA: Diagnosis not present

## 2017-09-21 DIAGNOSIS — B965 Pseudomonas (aeruginosa) (mallei) (pseudomallei) as the cause of diseases classified elsewhere: Secondary | ICD-10-CM | POA: Diagnosis not present

## 2017-09-21 DIAGNOSIS — R531 Weakness: Secondary | ICD-10-CM | POA: Diagnosis present

## 2017-09-21 DIAGNOSIS — Z6834 Body mass index (BMI) 34.0-34.9, adult: Secondary | ICD-10-CM | POA: Diagnosis not present

## 2017-09-21 DIAGNOSIS — T83518A Infection and inflammatory reaction due to other urinary catheter, initial encounter: Secondary | ICD-10-CM | POA: Diagnosis present

## 2017-09-21 DIAGNOSIS — N179 Acute kidney failure, unspecified: Secondary | ICD-10-CM | POA: Diagnosis present

## 2017-09-21 DIAGNOSIS — N289 Disorder of kidney and ureter, unspecified: Secondary | ICD-10-CM

## 2017-09-21 DIAGNOSIS — D649 Anemia, unspecified: Secondary | ICD-10-CM | POA: Diagnosis present

## 2017-09-21 DIAGNOSIS — T148XXA Other injury of unspecified body region, initial encounter: Secondary | ICD-10-CM

## 2017-09-21 DIAGNOSIS — E669 Obesity, unspecified: Secondary | ICD-10-CM | POA: Diagnosis present

## 2017-09-21 DIAGNOSIS — Y92009 Unspecified place in unspecified non-institutional (private) residence as the place of occurrence of the external cause: Secondary | ICD-10-CM

## 2017-09-21 DIAGNOSIS — L89153 Pressure ulcer of sacral region, stage 3: Secondary | ICD-10-CM | POA: Diagnosis not present

## 2017-09-21 DIAGNOSIS — N200 Calculus of kidney: Secondary | ICD-10-CM | POA: Diagnosis present

## 2017-09-21 DIAGNOSIS — R9431 Abnormal electrocardiogram [ECG] [EKG]: Secondary | ICD-10-CM | POA: Diagnosis not present

## 2017-09-21 DIAGNOSIS — L8990 Pressure ulcer of unspecified site, unspecified stage: Secondary | ICD-10-CM | POA: Diagnosis present

## 2017-09-21 DIAGNOSIS — S83282A Other tear of lateral meniscus, current injury, left knee, initial encounter: Secondary | ICD-10-CM | POA: Diagnosis present

## 2017-09-21 DIAGNOSIS — G8911 Acute pain due to trauma: Secondary | ICD-10-CM | POA: Diagnosis not present

## 2017-09-21 DIAGNOSIS — Y846 Urinary catheterization as the cause of abnormal reaction of the patient, or of later complication, without mention of misadventure at the time of the procedure: Secondary | ICD-10-CM | POA: Diagnosis present

## 2017-09-21 DIAGNOSIS — E7439 Other disorders of intestinal carbohydrate absorption: Secondary | ICD-10-CM | POA: Diagnosis present

## 2017-09-21 DIAGNOSIS — K5900 Constipation, unspecified: Secondary | ICD-10-CM | POA: Diagnosis present

## 2017-09-21 DIAGNOSIS — W19XXXA Unspecified fall, initial encounter: Secondary | ICD-10-CM | POA: Diagnosis present

## 2017-09-21 DIAGNOSIS — B964 Proteus (mirabilis) (morganii) as the cause of diseases classified elsewhere: Secondary | ICD-10-CM | POA: Diagnosis not present

## 2017-09-21 DIAGNOSIS — Z8546 Personal history of malignant neoplasm of prostate: Secondary | ICD-10-CM

## 2017-09-21 DIAGNOSIS — I110 Hypertensive heart disease with heart failure: Secondary | ICD-10-CM | POA: Diagnosis present

## 2017-09-21 DIAGNOSIS — N390 Urinary tract infection, site not specified: Secondary | ICD-10-CM | POA: Diagnosis present

## 2017-09-21 DIAGNOSIS — R2681 Unsteadiness on feet: Secondary | ICD-10-CM | POA: Diagnosis not present

## 2017-09-21 DIAGNOSIS — M6282 Rhabdomyolysis: Secondary | ICD-10-CM | POA: Diagnosis present

## 2017-09-21 DIAGNOSIS — L89622 Pressure ulcer of left heel, stage 2: Secondary | ICD-10-CM | POA: Diagnosis not present

## 2017-09-21 DIAGNOSIS — M6281 Muscle weakness (generalized): Secondary | ICD-10-CM | POA: Diagnosis not present

## 2017-09-21 DIAGNOSIS — R488 Other symbolic dysfunctions: Secondary | ICD-10-CM | POA: Diagnosis not present

## 2017-09-21 DIAGNOSIS — T83511A Infection and inflammatory reaction due to indwelling urethral catheter, initial encounter: Secondary | ICD-10-CM

## 2017-09-21 DIAGNOSIS — Z981 Arthrodesis status: Secondary | ICD-10-CM | POA: Diagnosis not present

## 2017-09-21 DIAGNOSIS — N3000 Acute cystitis without hematuria: Secondary | ICD-10-CM | POA: Diagnosis not present

## 2017-09-21 DIAGNOSIS — W19XXXD Unspecified fall, subsequent encounter: Secondary | ICD-10-CM | POA: Diagnosis not present

## 2017-09-21 DIAGNOSIS — R748 Abnormal levels of other serum enzymes: Secondary | ICD-10-CM | POA: Insufficient documentation

## 2017-09-21 DIAGNOSIS — G4733 Obstructive sleep apnea (adult) (pediatric): Secondary | ICD-10-CM | POA: Diagnosis present

## 2017-09-21 DIAGNOSIS — T83511D Infection and inflammatory reaction due to indwelling urethral catheter, subsequent encounter: Secondary | ICD-10-CM | POA: Diagnosis not present

## 2017-09-21 DIAGNOSIS — S83242A Other tear of medial meniscus, current injury, left knee, initial encounter: Secondary | ICD-10-CM | POA: Diagnosis present

## 2017-09-21 DIAGNOSIS — N2889 Other specified disorders of kidney and ureter: Secondary | ICD-10-CM | POA: Diagnosis not present

## 2017-09-21 DIAGNOSIS — R404 Transient alteration of awareness: Secondary | ICD-10-CM | POA: Diagnosis not present

## 2017-09-21 DIAGNOSIS — M179 Osteoarthritis of knee, unspecified: Secondary | ICD-10-CM | POA: Diagnosis not present

## 2017-09-21 DIAGNOSIS — M25561 Pain in right knee: Secondary | ICD-10-CM | POA: Diagnosis not present

## 2017-09-21 DIAGNOSIS — N312 Flaccid neuropathic bladder, not elsewhere classified: Secondary | ICD-10-CM | POA: Diagnosis not present

## 2017-09-21 DIAGNOSIS — R319 Hematuria, unspecified: Secondary | ICD-10-CM

## 2017-09-21 HISTORY — DX: Rhabdomyolysis: M62.82

## 2017-09-21 HISTORY — DX: Acute kidney failure, unspecified: N17.9

## 2017-09-21 HISTORY — DX: Urinary tract infection, site not specified: N39.0

## 2017-09-21 LAB — DIFFERENTIAL
BASOS ABS: 0 10*3/uL (ref 0.0–0.1)
Basophils Relative: 0 %
Eosinophils Absolute: 0 10*3/uL (ref 0.0–0.7)
Eosinophils Relative: 0 %
LYMPHS ABS: 2 10*3/uL (ref 0.7–4.0)
LYMPHS PCT: 10 %
Monocytes Absolute: 1.7 10*3/uL — ABNORMAL HIGH (ref 0.1–1.0)
Monocytes Relative: 9 %
NEUTROS PCT: 81 %
Neutro Abs: 15.8 10*3/uL — ABNORMAL HIGH (ref 1.7–7.7)

## 2017-09-21 LAB — HEPATIC FUNCTION PANEL
ALK PHOS: 70 U/L (ref 38–126)
ALT: 19 U/L (ref 17–63)
AST: 39 U/L (ref 15–41)
Albumin: 2.7 g/dL — ABNORMAL LOW (ref 3.5–5.0)
BILIRUBIN TOTAL: 1.1 mg/dL (ref 0.3–1.2)
Bilirubin, Direct: 0.3 mg/dL (ref 0.1–0.5)
Indirect Bilirubin: 0.8 mg/dL (ref 0.3–0.9)
Total Protein: 7.4 g/dL (ref 6.5–8.1)

## 2017-09-21 LAB — CBC
HCT: 39 % (ref 39.0–52.0)
Hemoglobin: 12.7 g/dL — ABNORMAL LOW (ref 13.0–17.0)
MCH: 28.5 pg (ref 26.0–34.0)
MCHC: 32.6 g/dL (ref 30.0–36.0)
MCV: 87.4 fL (ref 78.0–100.0)
PLATELETS: 249 10*3/uL (ref 150–400)
RBC: 4.46 MIL/uL (ref 4.22–5.81)
RDW: 14.4 % (ref 11.5–15.5)
WBC: 19 10*3/uL — AB (ref 4.0–10.5)

## 2017-09-21 LAB — URINALYSIS, ROUTINE W REFLEX MICROSCOPIC
Bilirubin Urine: NEGATIVE
Glucose, UA: NEGATIVE mg/dL
Ketones, ur: NEGATIVE mg/dL
Nitrite: NEGATIVE
PH: 7 (ref 5.0–8.0)
Protein, ur: NEGATIVE mg/dL
SPECIFIC GRAVITY, URINE: 1.008 (ref 1.005–1.030)

## 2017-09-21 LAB — BASIC METABOLIC PANEL
Anion gap: 13 (ref 5–15)
BUN: 23 mg/dL — AB (ref 6–20)
CHLORIDE: 94 mmol/L — AB (ref 101–111)
CO2: 27 mmol/L (ref 22–32)
CREATININE: 1.35 mg/dL — AB (ref 0.61–1.24)
Calcium: 9 mg/dL (ref 8.9–10.3)
GFR calc Af Amer: 59 mL/min — ABNORMAL LOW (ref >60)
GFR calc non Af Amer: 50 mL/min — ABNORMAL LOW (ref >60)
Glucose, Bld: 124 mg/dL — ABNORMAL HIGH (ref 65–99)
Potassium: 3.8 mmol/L (ref 3.5–5.1)
SODIUM: 134 mmol/L — AB (ref 135–145)

## 2017-09-21 LAB — CBG MONITORING, ED: Glucose-Capillary: 286 mg/dL — ABNORMAL HIGH (ref 65–99)

## 2017-09-21 LAB — CK: Total CK: 1054 U/L — ABNORMAL HIGH (ref 49–397)

## 2017-09-21 LAB — I-STAT CG4 LACTIC ACID, ED: LACTIC ACID, VENOUS: 0.83 mmol/L (ref 0.5–1.9)

## 2017-09-21 MED ORDER — TRAMADOL HCL 50 MG PO TABS
50.0000 mg | ORAL_TABLET | Freq: Four times a day (QID) | ORAL | Status: DC | PRN
  Administered 2017-09-21 – 2017-09-25 (×8): 50 mg via ORAL
  Filled 2017-09-21 (×8): qty 1

## 2017-09-21 MED ORDER — ONDANSETRON HCL 4 MG PO TABS: 4.0000 mg | ORAL_TABLET | Freq: Four times a day (QID) | ORAL | Status: DC | PRN

## 2017-09-21 MED ORDER — HYDRALAZINE HCL 20 MG/ML IJ SOLN: 5.0000 mg | Freq: Four times a day (QID) | INTRAMUSCULAR | Status: DC | PRN

## 2017-09-21 MED ORDER — ENOXAPARIN SODIUM 40 MG/0.4ML ~~LOC~~ SOLN
40.0000 mg | SUBCUTANEOUS | Status: DC
  Administered 2017-09-21 – 2017-09-26 (×6): 40 mg via SUBCUTANEOUS
  Filled 2017-09-21 (×6): qty 0.4

## 2017-09-21 MED ORDER — MELOXICAM 7.5 MG PO TABS
7.5000 mg | ORAL_TABLET | Freq: Every day | ORAL | Status: DC
  Administered 2017-09-21 – 2017-09-26 (×6): 7.5 mg via ORAL
  Filled 2017-09-21 (×6): qty 1

## 2017-09-21 MED ORDER — VITAMIN D3 250 MCG (10000 UT) PO CAPS: 10000.0000 [IU] | ORAL_CAPSULE | Freq: Every day | ORAL | Status: DC

## 2017-09-21 MED ORDER — VITAMIN D 1000 UNITS PO TABS
1000.0000 [IU] | ORAL_TABLET | Freq: Every day | ORAL | Status: DC
  Administered 2017-09-21 – 2017-09-26 (×6): 1000 [IU] via ORAL
  Filled 2017-09-21 (×6): qty 1

## 2017-09-21 MED ORDER — ACETAMINOPHEN 325 MG PO TABS: 650.0000 mg | ORAL_TABLET | Freq: Four times a day (QID) | ORAL | Status: DC | PRN

## 2017-09-21 MED ORDER — SODIUM CHLORIDE 0.9 % IV SOLN
INTRAVENOUS | Status: AC
  Administered 2017-09-21 – 2017-09-22 (×2): via INTRAVENOUS

## 2017-09-21 MED ORDER — VITAMIN E 45 MG (100 UNIT) PO CAPS
1000.0000 [IU] | ORAL_CAPSULE | Freq: Every day | ORAL | Status: DC
  Administered 2017-09-21 – 2017-09-26 (×6): 1000 [IU] via ORAL
  Filled 2017-09-21 (×6): qty 2

## 2017-09-21 MED ORDER — HYDROCERIN EX CREA
TOPICAL_CREAM | Freq: Every day | CUTANEOUS | Status: DC
  Administered 2017-09-21 – 2017-09-23 (×3): via TOPICAL
  Administered 2017-09-24: 1 via TOPICAL
  Administered 2017-09-25 – 2017-09-26 (×2): via TOPICAL
  Filled 2017-09-21 (×2): qty 113

## 2017-09-21 MED ORDER — CO Q 10 10 MG PO CAPS: 600.0000 mg | ORAL_CAPSULE | Freq: Every day | ORAL | Status: DC

## 2017-09-21 MED ORDER — ONDANSETRON HCL 4 MG/2ML IJ SOLN: 4.0000 mg | Freq: Four times a day (QID) | INTRAMUSCULAR | Status: DC | PRN

## 2017-09-21 MED ORDER — BISACODYL 5 MG PO TBEC
5.0000 mg | DELAYED_RELEASE_TABLET | Freq: Every day | ORAL | Status: DC | PRN
  Administered 2017-09-24: 5 mg via ORAL
  Filled 2017-09-21: qty 1

## 2017-09-21 MED ORDER — DEXTROSE 5 % IV SOLN
1.0000 g | INTRAVENOUS | Status: DC
  Administered 2017-09-21 – 2017-09-24 (×4): 1 g via INTRAVENOUS
  Filled 2017-09-21 (×4): qty 10

## 2017-09-21 MED ORDER — BUCHU-JUNIP-K GLUC-PARS-UVA UR PO TABS: 1.0000 | ORAL_TABLET | Freq: Every day | ORAL | Status: DC

## 2017-09-21 MED ORDER — DOCUSATE SODIUM 100 MG PO CAPS
100.0000 mg | ORAL_CAPSULE | Freq: Two times a day (BID) | ORAL | Status: DC | PRN
Start: 2017-09-21 — End: 2017-09-21

## 2017-09-21 MED ORDER — DOCUSATE SODIUM 100 MG PO CAPS
100.0000 mg | ORAL_CAPSULE | Freq: Two times a day (BID) | ORAL | Status: AC
  Administered 2017-09-21 – 2017-09-22 (×3): 100 mg via ORAL
  Filled 2017-09-21 (×4): qty 1

## 2017-09-21 MED ORDER — ACETAMINOPHEN 650 MG RE SUPP: 650.0000 mg | Freq: Four times a day (QID) | RECTAL | Status: DC | PRN

## 2017-09-21 NOTE — ED Notes (Signed)
Pt arrived to floor.  Pt is MS hold, alert and oriented.  Suprapubic catheter draining amber urine. Just completed IV Rocephin for UTI.  Pt has not had breakfast.  Pt was turned to left side at 0940.  Pt has pressure sore areas to heels bilaterally and right posterior calf.  Large buttocks sacral wound and draining.  Pt was recently discharged and at home alone sitting in a chair for 3 days. No BM since Sunday.  Pt has NS at 75cc/hr infusing.  Wound Ostomy at bedside and evaluating wounds.

## 2017-09-21 NOTE — ED Triage Notes (Signed)
Ems pt from home was seen at the hosp on Monday for a fall and sent home with home health to follow up, due to the patient living alone the home health policy is they can not enter his house. Pt reports he has been sitting in his chair x 3 days because he cant get up by himself.

## 2017-09-21 NOTE — Consult Note (Signed)
[facility] Nurse wound consult note Reason for Consult: sacrum and heels Patient is essentially chair bound per his on report with being completely unable to ambulate since Sunday. He sits and sleeps in recliner.  He has SP cath that has le Wound type: Stage 2 Pressure injury with MASD (moisture associated skin damage) right ischium Toe tip ulcers left great toe and left 4th toe Superficial ulceration right lateral calf  MASD buttocks and posterior scrotum Venous dermatitis bilaterally Stage 1 Pressure injury left and right heels  Intertriginous skin damage with Candida overgrowth groin and under his pannus  Pressure Injury POA: Yes Measurement: Right lateral calf: 0.3cm 0.3cm x 0.1cm; dry yellow serous crust Right ischium: aprox 4cm x 4cm x 0.1cm of "shaggy" in appearance skin with partial thickness skin loss centerally Left great toe and 4th toe: 100% black/eschar 0.2cm x0.2cm x 0 each  Wound bed: see above  Drainage (amount, consistency, odor) oozing bloody drainage from the right ischial wound, other sites are not draining  Periwound: intact but evidence of some scarring over the buttocks and sacrum  Dressing procedure/placement/frequency: Foam to the affected area right ischium Low air loss mattress for moisture management and pressure redistribution Prevalon boots to offload vunerable heels  Barrier cream as needed for buttocks and sacrum Eucerin to LE venous dermatitis  Antimicrobial wicking fabric to treat ITD/candida May need PO diflucan if candida doesn't clear with use of InterDry Ag+  Discussed POC with patient and bedside nurse.  Re consult if needed, will not follow at this time. Thanks  Nicholus Chandran [employer], RN,CWOCN, CNS, [place] [phone])

## 2017-09-21 NOTE — Evaluation (Signed)
Occupational Therapy Evaluation Patient Details Name: Jerry [name] MRN: [id number] DOB: 20-Oct-1945 Today's Date: 09/21/2017    History of Present Illness Cirilo Canner is a 71 y.o. male with suprapubic catheter and history of recurrent urinary tract infections presents with inability to walk and UTI.Pt was a [facility] 3 days pta with obstructing catheter, poor flow, odor and dark colored urine, falls  and inability to ambulate. PMHx: cervical and thoracic sx, arthritis   Clinical Impression   This 71 yo male admitted for above presents to acute OT with decreased mobility thus affecting his ability to care for himself at home as he was pta at a [facility] . He will benefit from acute OT with follow up OT at SNF.     Follow Up Recommendations  SNF;Supervision/Assistance - 24 hour    Equipment Recommendations  Other (comment)(TBD at next venue)       Precautions / Restrictions Precautions Precautions: Fall Precaution Comments: suprapubic catheter., legs very weak. Restrictions Weight Bearing Restrictions: No      Mobility Bed Mobility Overal bed mobility: Needs Assistance Bed Mobility: Rolling Rolling: Mod assist                     ADL either performed or assessed with clinical judgement   ADL Overall ADL's : Needs assistance/impaired Eating/Feeding: Independent;Bed level   Grooming: Set up;Bed level   Upper Body Bathing: Set up;Bed level   Lower Body Bathing: Total assistance;Bed level   Upper Body Dressing : Maximal assistance;Bed level   Lower Body Dressing: Total assistance;Bed level     Toilet Transfer Details (indicate cue type and reason): Mod A to roll for bed pan Toileting- Clothing Manipulation and Hygiene: Total assistance;Bed level               Vision Patient Visual Report: No change from baseline              Pertinent Vitals/Pain Pain Assessment: No/denies pain     Hand Dominance  right   Extremity/Trunk Assessment Upper Extremity  Assessment Upper Extremity Assessment: Overall WFL for tasks assessed   Lower Extremity Assessment Lower Extremity Assessment: Defer to PT evaluation       Communication  no issues   Cognition Arousal/Alertness: Awake/alert Behavior During Therapy: WFL for tasks assessed/performed Overall Cognitive Status: Within Functional Limits for tasks assessed                                                Home Living Family/patient expects to be discharged to:: Skilled nursing facility Living Arrangements: Alone Available Help at Discharge: Neighbor;Available PRN/intermittently Type of Home: House Home Access: Stairs to enter Entrance Stairs-Number of Steps: 1   Home Layout: Two level Alternate Level Stairs-Number of Steps: has a stair lift, stays downstairs in [place]: [place] - 2 wheels;Walker - 4 wheels;Shower seat;Other (comment);Grab bars - tub/shower;Shower seat - built in;Bedside commode;Toilet riser;Walker - standard   Additional Comments: a loftstrand crutch that he has taken the arm piece off of and uses it as a walking stick, and support for on and off toilet with vanity on other side. State he mainly uses SW inside except for bathroom where he holds onto vanity and his modified loft strand crutch  OT Problem List: Decreased strength;Decreased range of motion;Impaired balance (sitting and/or standing)      OT Treatment/Interventions: Self-care/ADL training;Balance training;Therapeutic activities;DME and/or AE instruction;Patient/family education    OT Goals(Current goals can be found in the care plan section) Acute Rehab OT Goals Patient Stated Goal: to walk again OT Goal Formulation: With patient Time For Goal Achievement: 10/05/17 Potential to Achieve Goals: Good  OT Frequency: Min 2X/week   Barriers to D/C: Decreased caregiver support             AM-PAC PT "6 Clicks" Daily Activity      Outcome Measure Help from another person eating meals?: None Help from another person taking care of personal grooming?: A Little Help from another person toileting, which includes using toliet, bedpan, or urinal?: Total Help from another person bathing (including washing, rinsing, drying)?: A Lot Help from another person to put on and taking off regular upper body clothing?: A Lot Help from another person to put on and taking off regular lower body clothing?: Total 6 Click Score: 13   End of Session    Activity Tolerance: Patient tolerated treatment well Patient left: in bed  OT Visit Diagnosis: Other abnormalities of gait and mobility (R26.89);Repeated falls (R29.6);History of falling (Z91.81)                Time: 1458-1510 OT Time Calculation (min): 12 min Charges:  OT General Charges $OT Visit: 1 Visit OT Evaluation $OT Eval Moderate Complexity: [address] [place] [phone] 09/21/2017

## 2017-09-21 NOTE — Progress Notes (Signed)
1351 Received patient from ED- alert oriented x4. Patient denies pain at this time. Resting comfortably in bed.

## 2017-09-21 NOTE — H&P (Signed)
History and Physical    Cortavius Montesinos MWN:[id number] DOB: [birth year] DOA: 09/21/2017  PCP: [facility]  Patient coming from: home  Chief Complaint: fall/generalized weakness  HPI: Jerry [name] is a 71 y.o. male with medical history significant for prostate cancer, neurogenic bladder status post suprapubic catheter, hypertension, obesity, chronic diastolic heart failure, cervical myelopathy since to the emergency department with the chief complaint of a fall 5 days ago accompanied with generalized weakness. Initial evaluation reveals acute kidney injury urinary tract infection and elevated CK  Information is obtained from the chart and the patient. He states 6 days ago developed generalized weakness of the point he was unable to get out of his chair. He called EMS assisted him to the bathroom with a walker and placed him back in his chair. That time he refused to go the hospital. He states later that day he attempted to go the bathroom on his own and he fell he is unable to get up so he called EMS again. EMS assisted him back in the chair. 2 Days after that he went [facility] emergency department and was discharged home with home health. He states the home health came but he was denied services because "I live alone". Instead he's been in that chair. He has had some water to drink but very little food no bowel movement no bathing. He denies any fever chills headache dizziness syncope or near-syncope. He reports that "my legs just get weak". Denies chest pain palpitations shortness of breath. He denies abdominal pain nausea vomiting diarrhea. He does state that he notes his urine becomes darker and that usually means he has urinary tract infection.has hx urosepsis as well as bacteremia. Chart review indicates his suprapubic catheter was changed out weeks ago.   ED Course: In the emergency department he's afebrile hemodynamically stable and not hypoxic.   Review of Systems: As per HPI  otherwise all other systems reviewed and are negative.   Ambulatory Status: At baseline patient ambulates with a walker  Past Medical History:  Diagnosis Date  . Arthritis    "hands, knees, back, legs" (06/10/2014)  . Cervical myelopathy ([place])   . Coma ([place]) 1968   "for 2 wks"  . QIHKVQQV(956.3)    "monthly" (06/10/2014)  . Malaria   . Prostate cancer ([place])   . PTSD (post-traumatic stress disorder)    "I'm all over that now"    Past Surgical History:  Procedure Laterality Date  . ANTERIOR CERVICAL DECOMP/DISCECTOMY FUSION  05/2007   Archie Endo 02/16/2011  . BACK SURGERY  2008   cervical and thoracic decompression for myelopathy  . PROSTATE SURGERY  03/2012   "shaved it down; didn't take it off"  . SHRAPNEL REMOVAL  1968   "HEAD, CHEST, LEFT HAND"  . SUPRAPUBIC CATHETER INSERTION    . THORACIC LAMINECTOMY  05/2007   Archie Endo 02/16/2011    Social History   Socioeconomic History  . Marital status: Single    Spouse name: Not on file  . Number of children: Not on file  . Years of education: Not on file  . Highest education level: Not on file  Social Needs  . Financial resource strain: Not on file  . Food insecurity - worry: Not on file  . Food insecurity - inability: Not on file  . Transportation needs - medical: Not on file  . Transportation needs - non-medical: Not on file  Occupational History  . Not on file  Tobacco Use  . Smoking  status: Never Smoker  . Smokeless tobacco: Never Used  . Tobacco comment: "smoked cigarettes  for ~ 1 wk in [employer]"  Substance and Sexual Activity  . Alcohol use: No  . Drug use: No  . Sexual activity: Not Currently  Other Topics Concern  . Not on file  Social History Narrative  . Not on file    No Known Allergies  Family History  Problem Relation Age of Onset  . Heart disease Mother   . Dementia Mother   . Heart disease Father   . Dementia Father     Prior to Admission medications   Medication Sig Start Date End Date Taking?  Authorizing Provider  [place]-Junip-K Gluc-Pars-Uva Ur (WATER PILL/POTASSIUM) TABS Take 1 tablet by mouth daily.   Yes [provider]  Calcium Carb-Cholecalciferol (CALCIUM 1000 + D PO) Take 1 tablet by mouth daily.   Yes [provider]  CALCIUM PO Take 1,000 mg by mouth daily.   Yes [provider]  Cholecalciferol (VITAMIN D3) 10000 units capsule Take 10,000 Units by mouth daily.   Yes [provider]  Coenzyme Q10 (CO Q 10 PO) Take 600 mg by mouth daily.   Yes [provider]  docusate sodium (COLACE) 100 MG capsule Take 100 mg by mouth 2 (two) times daily as needed for mild constipation.   Yes [provider]  furosemide (LASIX) 20 MG tablet TK 1 T PO QD 08/10/17  Yes [provider]  Glucosamine-Chondroitin (GLUCOSAMINE CHONDR COMPLEX PO) Take 600 mg by mouth daily.   Yes [provider]  meloxicam (MOBIC) 7.5 MG tablet Take 7.5 mg by mouth daily. 04/11/14  Yes [provider]  Multiple Vitamins-Minerals (MULTIVITAMIN PO) Take 1 tablet by mouth daily.   Yes [provider]  naproxen sodium (ALEVE) 220 MG tablet Take 440 mg by mouth daily as needed (pain).   Yes [provider]  Pomegranate, [place] granatum, (POMEGRANATE PO) Take 1 tablet by mouth daily.   Yes [provider]  RESVERATROL PO Take 600 mg by mouth daily.   Yes [provider]  traMADol (ULTRAM) 50 MG tablet Take 50 mg by mouth every 6 (six) hours as needed for moderate pain.   Yes [provider]  vitamin B-12 (CYANOCOBALAMIN) 1000 MCG tablet Take 2,000 mg by mouth daily.    Yes [provider]  vitamin E 1000 UNIT capsule Take 1,000 Units by mouth daily.    Yes [provider]    Physical Exam: Vitals:   09/21/17 0830 09/21/17 0900 09/21/17 0930 09/21/17 0945  BP: (!) 118/59 (!) 114/54 (!) 103/45 (!) 94/52  Pulse: 76 73 71 75  Resp: 17 16 15 16   Temp:      TempSrc:      SpO2: 92%  96% 94% 91%  Weight:      Height:         General:  Obese, unkept, pleasant in no acute distress Eyes:  PERRL, EOMI, normal lids, iris ENT:  grossly normal hearing, lips & tongue, mucous membranes of his mouth are pink but dry Neck:  no LAD, masses or thyromegaly Cardiovascular:  RRR, no m/r/g. Trace LE edema Respiratory:  CTA bilaterally, no w/r/r. Normal respiratory effort. Abdomen:  soft, ntnd, obese +BS no guarding or rebounding supra pubic cath in tact. No redness swelling at sit Skin:  Dry scaly unclean with unkept nails particularly toenails no rashes or lesion Musculoskeletal:  3rd toe left foot mild erythema swelling  deformity with no pain. Otherwise joints without swelling/erythema  Psychiatric:  grossly normal mood and affect, speech fluent and appropriate, AOx3 Neurologic:  CN 2-12 grossly intact, moves all extremities in coordinated fashion, sensation intact speech clear facial symetry  Labs on Admission: I have personally reviewed following labs and imaging studies  CBC: Recent Labs  Lab 09/17/17 1600 09/21/17 0629 09/21/17 0743  WBC 11.8* 19.0*  --   NEUTROABS 8.4*  --  15.8*  HGB 12.8* 12.7*  --   HCT 38.7* 39.0  --   MCV 89.2 87.4  --   PLT 250 249  --    Basic Metabolic Panel: Recent Labs  Lab 09/17/17 1600 09/21/17 0629  NA 137 134*  K 3.6 3.8  CL 98* 94*  CO2 30 27  GLUCOSE 121* 124*  BUN 17 23*  CREATININE 1.13 1.35*  CALCIUM 9.0 9.0   GFR: Estimated Creatinine Clearance: 72.9 mL/min (A) (by C-G formula based on SCr of 1.35 mg/dL (H)). Liver Function Tests: Recent Labs  Lab 09/21/17 0743  AST 39  ALT 19  ALKPHOS 70  BILITOT 1.1  PROT 7.4  ALBUMIN 2.7*   No results for input(s): LIPASE, AMYLASE in the last 168 hours. No results for input(s): AMMONIA in the last 168 hours. Coagulation Profile: No results for input(s): INR, PROTIME in the last 168 hours. Cardiac Enzymes: Recent Labs  Lab 09/21/17 0743  CKTOTAL 1,054*   BNP (last  3 results) No results for input(s): PROBNP in the last 8760 hours. HbA1C: No results for input(s): HGBA1C in the last 72 hours. CBG: No results for input(s): GLUCAP in the last 168 hours. Lipid Profile: No results for input(s): CHOL, HDL, LDLCALC, TRIG, CHOLHDL, LDLDIRECT in the last 72 hours. Thyroid Function Tests: No results for input(s): TSH, T4TOTAL, FREET4, T3FREE, THYROIDAB in the last 72 hours. Anemia Panel: No results for input(s): VITAMINB12, FOLATE, FERRITIN, TIBC, IRON, RETICCTPCT in the last 72 hours. Urine analysis:    Component Value Date/Time   COLORURINE YELLOW 09/21/2017 0733   APPEARANCEUR CLOUDY (A) 09/21/2017 0733   LABSPEC 1.008 09/21/2017 0733   PHURINE 7.0 09/21/2017 0733   GLUCOSEU NEGATIVE 09/21/2017 0733   HGBUR LARGE (A) 09/21/2017 0733   BILIRUBINUR NEGATIVE 09/21/2017 0733   KETONESUR NEGATIVE 09/21/2017 0733   PROTEINUR NEGATIVE 09/21/2017 0733   UROBILINOGEN 0.2 08/29/2014 2104   NITRITE NEGATIVE 09/21/2017 0733   LEUKOCYTESUR LARGE (A) 09/21/2017 0733    Creatinine Clearance: Estimated Creatinine Clearance: 72.9 mL/min (A) (by C-G formula based on SCr of 1.35 mg/dL (H)).  Sepsis Labs: @LABRCNTIP (procalcitonin:4,lacticidven:4) )No results found for this or any previous visit (from the past 240 hour(s)).   Radiological Exams on Admission: Dg Chest Port 1 View  Result Date: 09/21/2017 CLINICAL DATA:  Weakness. EXAM: PORTABLE CHEST 1 VIEW COMPARISON:  Radiographs of September 17, 2017. FINDINGS: The heart size and mediastinal contours are within normal limits. Both lungs are clear. Stable elevated right hemidiaphragm is noted. No pneumothorax or pleural effusion is noted. The visualized skeletal structures are unremarkable. IMPRESSION: No acute cardiopulmonary abnormality seen. Electronically Signed   By: Marijo Conception, M.D.   On: 09/21/2017 07:34    EKG: Independently reviewed. Normal sinus rhythmLeft axis deviation Abnormal ECG When compared  with ECG of 09/17/2017,Premature ventricular complexes are no longer present  Assessment/Plan Principal Problem:   UTI (urinary tract infection) Active Problems:   Fall at home   Obesity   Presence of suprapubic catheter ([facility])   Generalized weakness  HTN (hypertension)   Acute kidney injury ([place])   Rhabdomyolysis   #1.  urinary tract infection. Patient with a suprapubic catheter was changed out 3 weeks ago. Urinalysis with large leukocytes, WBCs too numerous to count, bacteria many, leukocytosis of 19, lactic acid within the limits of normal. His afebrile and hemodynamically stable. Hx or urosepsis and bacteremia -Admit -Follow urine culture -Rocephin -gentle IV fluids -tack lactic acid -monitor  2. Acute kidney injury. Creatinine 1.35. Baseline unclear but chart review indicates creatinine 1.13 4 days ago.  Likely related to decreased oral intake.  -Hold nephrotoxins -Gentle IV fluids -Monitor urine output -Recheck in the morning -if no improvement consider renal ultra sound  #3. Rhabdomyolisis. CK 1054, likely related to above.  -iv fluids -monitor urine output -recheck in am  4. Fall at home/generalized weakness. Related to decreased oral intake secondary to above in setting of inability to perform activities of daily living. EKG without acute changes chest x-ray without any cardiopulmonary.  -PT consult -nutritional consult -see above  #5. Hypertension. Controlled in the emergency department. Home meds include Lasix -Hold Lasix for now -monitor -prn hydralzaine  6. Chronic diastolic heart failure.compensated.  Home meds include lasix -Hold Lasix for now -Daily weights -Intake and output    DVT prophylaxis: lovenox  Code Status: full  Family Communication: none present  Disposition Plan: likely need placement  Consults called: none  Admission status: inpatient    Radene Gunning MD [facility]  If 7PM-7AM, please contact  night-coverage [email or web address] Password [email or web address]  09/21/2017, 10:13 AM

## 2017-09-21 NOTE — Care Management Note (Addendum)
Case Management Note  Patient Details  Name: Jerry [name] MRN: [id number] Date of Birth: 11-08-1945  Subjective/Objective:                  71 yo male has had profound weakness for the last 5 days.  5 days ago, he collapsed while in his bathroom.  EMS lifted him up, but he collapsed again.  They assisted him to the commode and then back to his chair.  Since then, he has not been able to get out of his chair.  When he has been weak like this in the past, it has been related to a urinary tract infection.  He does have an indwelling suprapubic catheter. From home alone.  Action/Plan: Admit status INPATIENT; anticipate discharge SNF.   Expected Discharge Date:  (unknown)               Expected Discharge Plan:  Skilled Nursing Facility  In-House Referral:  Clinical Social Work  Discharge planning Services  CM Consult  Post Acute Care Choice:  Home Health Choice offered to:  Patient  DME Arranged:    DME Agency:     HH Arranged:    [place] Agency:     Status of Service:  In process, will continue to follow  If discussed at Long Length of Stay Meetings, dates discussed:    Additional Comments: Pt set up  with [facility] for RN, PT, OT, NA, SW services at last admission, but they have dropped the case.  Pt may be candidate for [facility] if SNF placement is not an option.   Fuller Mandril, RN 09/21/2017, 9:20 AM

## 2017-09-21 NOTE — ED Provider Notes (Signed)
[facility] EMERGENCY DEPARTMENT Provider Note   CSN: [id number] Arrival date & time: 09/21/17  0554     History   Chief Complaint Chief Complaint  Patient presents with  . Weakness    HPI Jerry [name] is a 71 y.o. male.  The history is provided by the patient.  He has had profound weakness for the last 5 days.  5 days ago, he collapsed while in his bathroom.  EMS lifted him up, but he collapsed again.  They assisted him to the commode and then back to his chair.  Since then, he has not been able to get out of his chair.  When he has been weak like this in the past, it has been related to a urinary tract infection.  He does have an indwelling suprapubic catheter.  He he denies fever but has had some chills.  There is been no nausea or vomiting.  He has noted that his urine has been darker than normal and has been malodorous.  This is the way it typically presents with urinary tract infections.  Past Medical History:  Diagnosis Date  . Arthritis    "hands, knees, back, legs" (06/10/2014)  . Cervical myelopathy ([place])   . Coma ([place]) 1968   "for 2 wks"  . IONGEXBM(841.3)    "monthly" (06/10/2014)  . Malaria   . Prostate cancer ([place])   . PTSD (post-traumatic stress disorder)    "I'm all over that now"    Patient Active Problem List   Diagnosis Date Noted  . Staphylococcus aureus bacteremia 06/30/2014  . Generalized weakness 06/23/2014  . Chronic diastolic congestive heart failure ([place]) 06/23/2014  . HTN (hypertension) 06/23/2014  . Prostate cancer ([place]) 06/23/2014  . Presence of suprapubic catheter ([place]) 06/17/2014  . Cervical myelopathy ([place])   . Arthritis   . Urinary incontinence, functional 10/25/2012  . Bilateral leg weakness 10/25/2012  . Fall at home 10/25/2012  . UTI (lower urinary tract infection) 10/25/2012  . Sleep apnea 10/25/2012  . Medically noncompliant 10/25/2012  . Obesity 10/25/2012    Past Surgical History:  Procedure Laterality  Date  . ANTERIOR CERVICAL DECOMP/DISCECTOMY FUSION  05/2007   Archie Endo 02/16/2011  . BACK SURGERY  2008   cervical and thoracic decompression for myelopathy  . PROSTATE SURGERY  03/2012   "shaved it down; didn't take it off"  . SHRAPNEL REMOVAL  1968   "HEAD, CHEST, LEFT HAND"  . SUPRAPUBIC CATHETER INSERTION    . THORACIC LAMINECTOMY  05/2007   Archie Endo 02/16/2011       Home Medications    Prior to Admission medications   Medication Sig Start Date End Date Taking? Authorizing Provider  Buchu-Junip-K Gluc-Pars-Uva Ur (WATER PILL/POTASSIUM) TABS Take 1 tablet by mouth daily.    [provider]  Calcium Carb-Cholecalciferol (CALCIUM 1000 + D PO) Take 1 tablet by mouth daily.    [provider]  CALCIUM PO Take 1,000 mg by mouth daily.    [provider]  cephALEXin (KEFLEX) 500 MG capsule Take 1 capsule (500 mg total) by mouth 3 (three) times daily. Patient not taking: Reported on 09/17/2017 01/20/17   Clayton Bibles, PA-C  Cholecalciferol (VITAMIN D3) 10000 units capsule Take 10,000 Units by mouth daily.    [provider]  Coenzyme Q10 (CO Q 10 PO) Take 600 mg by mouth daily.    [provider]  docusate sodium (COLACE) 100 MG capsule Take 100 mg by mouth 2 (two) times daily as  needed for mild constipation.    [provider]  furosemide (LASIX) 20 MG tablet TK 1 T PO QD 08/10/17   [provider]  Glucosamine-Chondroitin (GLUCOSAMINE CHONDR COMPLEX PO) Take 600 mg by mouth daily.    [provider]  meloxicam (MOBIC) 7.5 MG tablet Take 7.5 mg by mouth daily. 04/11/14   [provider]  Multiple Vitamins-Minerals (MULTIVITAMIN PO) Take 1 tablet by mouth daily.    [provider]  naproxen sodium (ALEVE) 220 MG tablet Take 440 mg by mouth daily as needed (pain).    [provider]  Pomegranate, [place] granatum, (POMEGRANATE PO) Take 1 tablet by mouth daily.    [provider]  RESVERATROL PO  Take 600 mg by mouth daily.    [provider]  traMADol (ULTRAM) 50 MG tablet Take 50 mg by mouth every 6 (six) hours as needed for moderate pain.    [provider]  vitamin B-12 (CYANOCOBALAMIN) 1000 MCG tablet Take 2,000 mg by mouth daily.     [provider]  vitamin E 1000 UNIT capsule Take 1,000 Units by mouth daily.     [provider]    Family History Family History  Problem Relation Age of Onset  . Heart disease Mother   . Dementia Mother   . Heart disease Father   . Dementia Father     Social History Social History   Tobacco Use  . Smoking status: Never Smoker  . Smokeless tobacco: Never Used  . Tobacco comment: "smoked cigarettes  for ~ 1 wk in [place]"  Substance Use Topics  . Alcohol use: No  . Drug use: No     Allergies   Patient has no known allergies.   Review of Systems Review of Systems  All other systems reviewed and are negative.    Physical Exam Updated Vital Signs BP (!) 121/58 (BP Location: Right Arm)   Pulse 81   Temp 98.4 F (36.9 C) (Oral)   Resp 15   SpO2 94%   Physical Exam  Nursing note and vitals reviewed.  71 year old male, resting comfortably and in no acute distress. Vital signs are normal. Oxygen saturation is 94%, which is normal. Head is normocephalic and atraumatic. PERRLA, EOMI. Oropharynx is clear. Neck is nontender and supple without adenopathy or JVD. Back is nontender and there is no CVA tenderness. Lungs are clear without rales, wheezes, or rhonchi. Chest is nontender. Heart has regular rate and rhythm without murmur. Abdomen is soft, flat, nontender without masses or hepatosplenomegaly and peristalsis is normoactive.  Suprapubic catheter is in place with no significant erythema at the insertion site. Extremities have trace edema, full range of motion is present. Skin is warm and dry without rash. Neurologic: Mental status is normal, cranial nerves are intact, there are no  focal motor or sensory deficits.  There is generalized weakness.  ED Treatments / Results  Labs (all labs ordered are listed, but only abnormal results are displayed) Labs Reviewed  BASIC METABOLIC PANEL - Abnormal; Notable for the following components:      Result Value   Sodium 134 (*)    Chloride 94 (*)    Glucose, Bld 124 (*)    BUN 23 (*)    Creatinine, Ser 1.35 (*)    GFR calc non Af Amer 50 (*)    GFR calc Af Amer 59 (*)    All other components within normal limits  CBC - Abnormal; Notable for  the following components:   WBC 19.0 (*)    Hemoglobin 12.7 (*)    All other components within normal limits  URINALYSIS, ROUTINE W REFLEX MICROSCOPIC - Abnormal; Notable for the following components:   APPearance CLOUDY (*)    Hgb urine dipstick LARGE (*)    Leukocytes, UA LARGE (*)    Bacteria, UA MANY (*)    Squamous Epithelial / LPF 6-30 (*)    All other components within normal limits  URINE CULTURE  DIFFERENTIAL  HEPATIC FUNCTION PANEL  CK  CBG MONITORING, ED  I-STAT CG4 LACTIC ACID, ED    EKG  EKG Interpretation  Date/Time:  Friday September 21 2017 06:09:26 EST Ventricular Rate:  79 PR Interval:  176 QRS Duration: 98 QT Interval:  392 QTC Calculation: 449 R Axis:   -40 Text Interpretation:  Normal sinus rhythm Left axis deviation Abnormal ECG When compared with ECG of 09/17/2017, Premature ventricular complexes are no longer present Confirmed by Delora Fuel ([id number]) on 09/21/2017 6:43:46 AM       Radiology Dg Chest Port 1 View  Result Date: 09/21/2017 CLINICAL DATA:  Weakness. EXAM: PORTABLE CHEST 1 VIEW COMPARISON:  Radiographs of September 17, 2017. FINDINGS: The heart size and mediastinal contours are within normal limits. Both lungs are clear. Stable elevated right hemidiaphragm is noted. No pneumothorax or pleural effusion is noted. The visualized skeletal structures are unremarkable. IMPRESSION: No acute cardiopulmonary abnormality seen. Electronically Signed    By: Marijo Conception, M.D.   On: 09/21/2017 07:34    Procedures Procedures (including critical care time)  Medications Ordered in ED Medications - No data to display   Initial Impression / Assessment and Plan / ED Course  I have reviewed the triage vital signs and the nursing notes.  Pertinent labs & imaging results that were available during my care of the patient were reviewed by me and considered in my medical decision making (see chart for details).  Generalized weakness of uncertain cause.  Consider occult infection such as urinary tract infection.  Consider electrolyte disturbance.  Will check chest x-ray and urinalysis as well as routine screening labs.  Will check CK to rule out rhabdomyolysis.  Old records are reviewed, and he was seen in the ED 4 days ago and diagnosed with acute cystitis, not placed on antibiotics.  Urinalysis clearly shows evidence of infection, but it is not clear whether this is localized or systemic.  WBC is significantly elevated.  Mild renal insufficiency is present and not significantly changed from baseline.  CK is pending.  Lactic acid level is normal so is not showing any signs of actual sepsis.  Case is discussed with Dyanne Carrel of [facility] who agrees to come to admit the patient.  At this point, I have not administered antibiotics-that decision is being left to the admitting hospitalist.  Final Clinical Impressions(s) / ED Diagnoses   Final diagnoses:  Weakness  Urinary tract infection with hematuria, site unspecified  Renal insufficiency    ED Discharge Orders    None       Delora Fuel, MD 21/30/86 [phone]

## 2017-09-22 ENCOUNTER — Inpatient Hospital Stay (HOSPITAL_COMMUNITY): Payer: Medicare Other

## 2017-09-22 DIAGNOSIS — N3000 Acute cystitis without hematuria: Secondary | ICD-10-CM

## 2017-09-22 DIAGNOSIS — L8990 Pressure ulcer of unspecified site, unspecified stage: Secondary | ICD-10-CM

## 2017-09-22 LAB — BASIC METABOLIC PANEL
ANION GAP: 11 (ref 5–15)
BUN: 20 mg/dL (ref 6–20)
CALCIUM: 8.7 mg/dL — AB (ref 8.9–10.3)
CO2: 26 mmol/L (ref 22–32)
Chloride: 97 mmol/L — ABNORMAL LOW (ref 101–111)
Creatinine, Ser: 1.15 mg/dL (ref 0.61–1.24)
GLUCOSE: 121 mg/dL — AB (ref 65–99)
POTASSIUM: 3.7 mmol/L (ref 3.5–5.1)
SODIUM: 134 mmol/L — AB (ref 135–145)

## 2017-09-22 LAB — CBC
HEMATOCRIT: 34.8 % — AB (ref 39.0–52.0)
HEMOGLOBIN: 11.2 g/dL — AB (ref 13.0–17.0)
MCH: 28.2 pg (ref 26.0–34.0)
MCHC: 32.2 g/dL (ref 30.0–36.0)
MCV: 87.7 fL (ref 78.0–100.0)
Platelets: 259 10*3/uL (ref 150–400)
RBC: 3.97 MIL/uL — ABNORMAL LOW (ref 4.22–5.81)
RDW: 14.3 % (ref 11.5–15.5)
WBC: 17.2 10*3/uL — AB (ref 4.0–10.5)

## 2017-09-22 LAB — CK: CK TOTAL: 687 U/L — AB (ref 49–397)

## 2017-09-22 MED ORDER — SODIUM CHLORIDE 0.9 % IV SOLN
INTRAVENOUS | Status: AC
  Administered 2017-09-23: 03:00:00 via INTRAVENOUS

## 2017-09-22 NOTE — Progress Notes (Signed)
PROGRESS NOTE    Jerry [name]  IDP:[id number] DOB: [birth year] DOA: 09/21/2017 PCP: Aura Dials, MD   Specialists:     Brief Narrative:  31 male prostate ca  Cervical injury thoracis injury resulting in suprapubic cath HTn Obesity OSA on cpap Chr D HF Cervical myelopathy  admit 12/7 with probable Urinary tract infection in setting of debility  Of note Rx before for MSSA bacteremiain 2015 Also has been told that regarding his bilateral nephrolithiasis with Large L staghorn calculus he would benefit from perc nephrolothistomy  Assessment & Plan:   Principal Problem:   UTI (urinary tract infection) Active Problems:   Fall at home   Obesity   Presence of suprapubic catheter ([place])   Generalized weakness   HTN (hypertension)   Acute kidney injury ([place])   Rhabdomyolysis   Pressure injury of skin   #1.  urinary tract infection. suprapubic catheter was changed out 3 weeks ago.  UA =infection Await Ucult Cont IV rocephin IVF 75 cc/h for now   2. Acute kidney injury. Creatinine 1.35. Baseline unclear but chart review indicates creatinine 1.13 4 days ago.  Likely related to decreased oral intake.  -if no improvement  In am consider renal ultra sound  #3. Rhabdomyolisis. CK 1054, likely related to above.  Now down to 687  4. Fall at home/generalized weakness. Related to decreased oral intake secondary to above in setting of inability to perform activities of daily living. EKG without acute changes chest x-ray without any cardiopulmonary.  -PT consult--he is unable to flex his knee and tells me he had a severe fall  In the past 2-3 days -4 view xrays of knees--is neg, ordering MRI knee   #5. Hypertension. Controlled in the emergency department. Home meds include Lasix -Hold Lasix for now -monitor -prn hydralzaine  6. Chronic diastolic heart failure.compensated.  Home meds include lasix -Hold Lasix for now -Daily weights -Intake and  output      lovenox Inpatient Full code proablybly need snf?  Consultants:   none  Procedures:   none  Antimicrobials:   rocephibn    Subjective: Awake alert oriented in sever eknee pain and cannot bear weight No fever no chills Urine still dark No cp tol diet  Objective: Vitals:   09/21/17 1125 09/21/17 1351 09/21/17 2111 09/22/17 0437  BP: (!) 103/58 (!) 110/45 (!) 111/46 117/68  Pulse: 97 78 74 78  Resp: 17  18   Temp:  98.6 F (37 C) 99.1 F (37.3 C) 98.8 F (37.1 C)  TempSrc:  Oral Oral Oral  SpO2: 98% 90% 95% 92%  Weight:      Height:        Intake/Output Summary (Last 24 hours) at 09/22/2017 1249 Last data filed at 09/22/2017 0553 Gross per 24 hour  Intake 2236.25 ml  Output 2200 ml  Net 36.25 ml   Filed Weights   09/21/17 0611  Weight: 130.6 kg (288 lb)    Examination:  eomi ncat in nad Chest clear without added sound abd soft nt nd suprapubic cath site is clean without any swelling or sign of infections No le edema Neuro intact   Data Reviewed: I have personally reviewed following labs and imaging studies  CBC: Recent Labs  Lab 09/17/17 1600 09/21/17 0629 09/21/17 0743 09/22/17 0434  WBC 11.8* 19.0*  --  17.2*  NEUTROABS 8.4*  --  15.8*  --   HGB 12.8* 12.7*  --  11.2*  HCT 38.7* 39.0  --  34.8*  MCV 89.2 87.4  --  87.7  PLT 250 249  --  161   Basic Metabolic Panel: Recent Labs  Lab 09/17/17 1600 09/21/17 0629 09/22/17 0434  NA 137 134* 134*  K 3.6 3.8 3.7  CL 98* 94* 97*  CO2 30 27 26   GLUCOSE 121* 124* 121*  BUN 17 23* 20  CREATININE 1.13 1.35* 1.15  CALCIUM 9.0 9.0 8.7*   GFR: Estimated Creatinine Clearance: 85.5 mL/min (by C-G formula based on SCr of 1.15 mg/dL). Liver Function Tests: Recent Labs  Lab 09/21/17 0743  AST 39  ALT 19  ALKPHOS 70  BILITOT 1.1  PROT 7.4  ALBUMIN 2.7*   No results for input(s): LIPASE, AMYLASE in the last 168 hours. No results for input(s): AMMONIA in the last 168  hours. Coagulation Profile: No results for input(s): INR, PROTIME in the last 168 hours. Cardiac Enzymes: Recent Labs  Lab 09/21/17 0743 09/22/17 0434  CKTOTAL 1,054* 687*   BNP (last 3 results) No results for input(s): PROBNP in the last 8760 hours. HbA1C: No results for input(s): HGBA1C in the last 72 hours. CBG: Recent Labs  Lab 09/21/17 1012  GLUCAP 286*   Lipid Profile: No results for input(s): CHOL, HDL, LDLCALC, TRIG, CHOLHDL, LDLDIRECT in the last 72 hours. Thyroid Function Tests: No results for input(s): TSH, T4TOTAL, FREET4, T3FREE, THYROIDAB in the last 72 hours. Anemia Panel: No results for input(s): VITAMINB12, FOLATE, FERRITIN, TIBC, IRON, RETICCTPCT in the last 72 hours. Urine analysis:    Component Value Date/Time   COLORURINE YELLOW 09/21/2017 0733   APPEARANCEUR CLOUDY (A) 09/21/2017 0733   LABSPEC 1.008 09/21/2017 0733   PHURINE 7.0 09/21/2017 0733   GLUCOSEU NEGATIVE 09/21/2017 0733   HGBUR LARGE (A) 09/21/2017 0733   BILIRUBINUR NEGATIVE 09/21/2017 0733   KETONESUR NEGATIVE 09/21/2017 0733   PROTEINUR NEGATIVE 09/21/2017 0733   UROBILINOGEN 0.2 08/29/2014 2104   NITRITE NEGATIVE 09/21/2017 0733   LEUKOCYTESUR LARGE (A) 09/21/2017 0733     Radiology Studies: Reviewed images personally in health database    Scheduled Meds: . cholecalciferol  1,000 Units Oral Daily  . docusate sodium  100 mg Oral BID  . enoxaparin (LOVENOX) injection  40 mg Subcutaneous Q24H  . hydrocerin   Topical Daily  . meloxicam  7.5 mg Oral Daily  . vitamin E  1,000 Units Oral Daily   Continuous Infusions: . cefTRIAXone (ROCEPHIN)  IV Stopped (09/22/17 0928)     LOS: 1 day    Time spent: [place], MD [facility]   If 7PM-7AM, please contact night-coverage [email or web address] Password [facility] 09/22/2017, 12:49 PM

## 2017-09-22 NOTE — Progress Notes (Signed)
Physical Therapy Evaluation Patient Details Name: Jerry [name] MRN: [id number] DOB: 23-Jan-1946 Today's Date: 09/22/2017   History of Present Illness  Jerry [name] is a 71 y.o. male with suprapubic catheter and history of recurrent urinary tract infections presents with inability to walk and UTI.Pt was a [facility] 3 days pta with obstructing catheter, poor flow, odor and dark colored urine, falls  and inability to ambulate. PMHx: cervical and thoracic sx, arthritis  Clinical Impression  Jerry [name] admitted due to the above. Patient currently unable to perform bed mobility, transfers or gait without heavy assistance. Patient today with generalized weakness of B LE with difficulty rolling in bed as well as repositioning self without assistance. Heavy VC required for hand and foot placement to aid in bed mobility with patient seemingly not understanding need of use of LE. Attempted heel slides as well as SLR today with little success independently and patient requiring assistance. Will continue to follow acutely to maximize functional mobility prior to d/c.     Follow Up Recommendations SNF;Supervision/Assistance - 24 hour    Equipment Recommendations  Wheelchair (measurements PT)    Recommendations for Other Services       Precautions / Restrictions Precautions Precautions: Fall Precaution Comments: suprapubic catheter., legs very weak. Restrictions Weight Bearing Restrictions: No      Mobility  Bed Mobility Overal bed mobility: Needs Assistance Bed Mobility: Rolling Rolling: Max assist;+2 for physical assistance Sidelying to sit: Total assist;+2 for physical assistance;+2 for safety/equipment       General bed mobility comments: patient able to use UE on bed rails with little success. Patient not understanding need to utilize LE for rolling and pching up into bed.   Transfers                 General transfer comment: unable to assist trasnfer level due to level of difficulty  with bed mobility - only likely to increase fall risk currently   Ambulation/Gait                Stairs            Wheelchair Mobility    Modified Rankin (Stroke Patients Only)       Balance                                             Pertinent Vitals/Pain Pain Assessment: No/denies pain Pain Location: requires towel roll under L knee - as he reports pain with extension    Home Living Family/patient expects to be discharged to:: Skilled nursing facility Living Arrangements: Alone Available Help at Discharge: Neighbor;Available PRN/intermittently Type of Home: House Home Access: Stairs to enter   Entrance Stairs-Number of Steps: 1 Home Layout: Two level;Able to live on main level with bedroom/bathroom Home Equipment: Gilford Rile - 2 wheels;Walker - 4 wheels;Shower seat;Other (comment);Grab bars - tub/shower;Shower seat - built in;Bedside commode;Toilet riser;Walker - standard Additional Comments: a loftstrand crutch that he has taken the arm piece off of and uses it as a walking stick, and support for on and off toilet with vanity on other side. State he mainly uses SW inside except for bathroom where he holds onto vanity and his modified loft strand crutch    Prior Function Level of Independence: Independent with assistive device(s)         Comments: ambulates w/ RW     Hand Dominance  Extremity/Trunk Assessment   Upper Extremity Assessment Upper Extremity Assessment: Overall WFL for tasks assessed    Lower Extremity Assessment Lower Extremity Assessment: Generalized weakness LLE Deficits / Details: attempted heel slides and SLR with great difficulty       Communication   Communication: No difficulties  Cognition Arousal/Alertness: Awake/alert Behavior During Therapy: WFL for tasks assessed/performed Overall Cognitive Status: Within Functional Limits for tasks assessed                                         General Comments      Exercises     Assessment/Plan    PT Assessment Patient needs continued PT services  PT Problem List Decreased strength;Decreased range of motion;Decreased activity tolerance;Decreased balance;Decreased mobility;Decreased knowledge of precautions;Decreased safety awareness;Decreased knowledge of use of DME       PT Treatment Interventions DME instruction;Gait training;Functional mobility training;Therapeutic activities;Therapeutic exercise;Patient/family education    PT Goals (Current goals can be found in the Care Plan section)  Acute Rehab PT Goals Patient Stated Goal: to walk again PT Goal Formulation: With patient Time For Goal Achievement: 10/06/17 Potential to Achieve Goals: Fair    Frequency Min 2X/week   Barriers to discharge Decreased caregiver support      Co-evaluation               AM-PAC PT "6 Clicks" Daily Activity  Outcome Measure Difficulty turning over in bed (including adjusting bedclothes, sheets and blankets)?: Unable Difficulty moving from lying on back to sitting on the side of the bed? : Unable Difficulty sitting down on and standing up from a chair with arms (e.g., wheelchair, bedside commode, etc,.)?: Unable Help needed moving to and from a bed to chair (including a wheelchair)?: Total Help needed walking in hospital room?: Total Help needed climbing 3-5 steps with a railing? : Total 6 Click Score: 6    End of Session     Patient left: in bed;with call bell/phone within reach Nurse Communication: Mobility status;Need for lift equipment PT Visit Diagnosis: Unsteadiness on feet (R26.81);Other abnormalities of gait and mobility (R26.89);Difficulty in walking, not elsewhere classified (R26.2)    Time: 9767-3419 PT Time Calculation (min) (ACUTE ONLY): 23 min   Charges:   PT Evaluation $PT Eval Moderate Complexity: 1 Mod     Lanney Gins, PT, DPT 09/22/17 1:47 PM

## 2017-09-23 ENCOUNTER — Inpatient Hospital Stay (HOSPITAL_COMMUNITY): Payer: Medicare Other

## 2017-09-23 NOTE — Progress Notes (Signed)
Orthopedic Tech Progress Note Patient Details:  Jerry [name] 14-Feb-1946 [id number]  Ortho Devices Type of Ortho Device: Knee Immobilizer Ortho Device/Splint Location: lle Ortho Device/Splint Interventions: Application   Post Interventions Patient Tolerated: Well Instructions Provided: Care of device   Hildred Priest 09/23/2017, 5:38 PM

## 2017-09-23 NOTE — Progress Notes (Signed)
PROGRESS NOTE    Jerry [name]  VZD:[id number] DOB: 1946/07/28 DOA: 09/21/2017 PCP: Aura Dials, MD   Specialists:     Brief Narrative:   38 male prostate ca  Cervical injury thoracis injury resulting in suprapubic cath HTn Obesity OSA on cpap Chr D HF Cervical myelopathy  admit 12/7 with probable Urinary tract infection in setting of debility  Of note Rx before for MSSA bacteremiain 2015 Also has been told that regarding his bilateral nephrolithiasis with Large L staghorn calculus he would benefit from perc nephrolothistomy  Assessment & Plan:   Principal Problem:   UTI (urinary tract infection) Active Problems:   Fall at home   Obesity   Presence of suprapubic catheter ([place])   Generalized weakness   HTN (hypertension)   Acute kidney injury ([place])   Rhabdomyolysis   Pressure injury of skin   #1.  urinary tract infection with Gram Neg rods suprapubic catheter was changed out 3 weeks ago.  UA =infection Await Ucult c/s Cont IV rocephin IVF 75 cc/h for now  2. Acute kidney injury. Creatinine 1.35. Baseline unclear but chart review indicates creatinine 1.13 4 days ago.   Likely related to decreased oral intake.  -has improved to Creat 20/1.15  #3. Rhabdomyolisis. CK 1054, likely related to above.  Now down to 687 rptm CK in am  4. Fall at home/generalized weakness. Related to decreased oral intake secondary to above in setting of inability to perform activities of daily living. EKG without acute changes chest x-ray without any cardiopulmonary.  -PT consult--he is unable to flex his knee and tells me he had a severe fall  In the past 2-3 days -4 view xrays of knees--is neg, ordering MRI knee which is still pending due to lack of staff 2/2 to snow storm [place]   #5. Hypertension. Controlled in the emergency department. Home meds include Lasix -Hold Lasix for now -monitor -prn hydralzaine  6. Chronic diastolic heart failure.compensated.  Home meds  include lasix -Hold Lasix for now -Daily weights -Intake and output      lovenox Inpatient Full code probably need snf?  Consultants:   none  Procedures:   none  Antimicrobials:   rocephibn    Subjective:  Still in mod -severe pain No n/v Eating some Just back from MRI Knee No cp tol some diet  Objective: Vitals:   09/22/17 0437 09/22/17 1335 09/22/17 2118 09/23/17 0619  BP: 117/68 (!) 103/45 (!) 110/46 (!) 119/55  Pulse: 78 72 75 72  Resp:   18 18  Temp: 98.8 F (37.1 C) 99.1 F (37.3 C) 99.3 F (37.4 C) 99 F (37.2 C)  TempSrc: Oral Oral Oral Oral  SpO2: 92% 91% 92% 94%  Weight:      Height:        Intake/Output Summary (Last 24 hours) at 09/23/2017 1216 Last data filed at 09/23/2017 0600 Gross per 24 hour  Intake 1505 ml  Output 2200 ml  Net -695 ml   Filed Weights   09/21/17 2951  Weight: 130.6 kg (288 lb)    Examination:  eomi ncat in nad Chest clear without added sound abd soft nt nd suprapubic cath site is clean without any swelling or sign of infections No le edema Neuro intact Sacrum not examined today   Data Reviewed: I have personally reviewed following labs and imaging studies  CBC: Recent Labs  Lab 09/17/17 1600 09/21/17 0629 09/21/17 0743 09/22/17 0434  WBC 11.8* 19.0*  --  17.2*  NEUTROABS 8.4*  --  15.8*  --   HGB 12.8* 12.7*  --  11.2*  HCT 38.7* 39.0  --  34.8*  MCV 89.2 87.4  --  87.7  PLT 250 249  --  681   Basic Metabolic Panel: Recent Labs  Lab 09/17/17 1600 09/21/17 0629 09/22/17 0434  NA 137 134* 134*  K 3.6 3.8 3.7  CL 98* 94* 97*  CO2 30 27 26   GLUCOSE 121* 124* 121*  BUN 17 23* 20  CREATININE 1.13 1.35* 1.15  CALCIUM 9.0 9.0 8.7*   GFR: Estimated Creatinine Clearance: 85.5 mL/min (by C-G formula based on SCr of 1.15 mg/dL). Liver Function Tests: Recent Labs  Lab 09/21/17 0743  AST 39  ALT 19  ALKPHOS 70  BILITOT 1.1  PROT 7.4  ALBUMIN 2.7*   No results for input(s):  LIPASE, AMYLASE in the last 168 hours. No results for input(s): AMMONIA in the last 168 hours. Coagulation Profile: No results for input(s): INR, PROTIME in the last 168 hours. Cardiac Enzymes: Recent Labs  Lab 09/21/17 0743 09/22/17 0434  CKTOTAL 1,054* 687*   BNP (last 3 results) No results for input(s): PROBNP in the last 8760 hours. HbA1C: No results for input(s): HGBA1C in the last 72 hours. CBG: Recent Labs  Lab 09/21/17 1012  GLUCAP 286*   Lipid Profile: No results for input(s): CHOL, HDL, LDLCALC, TRIG, CHOLHDL, LDLDIRECT in the last 72 hours. Thyroid Function Tests: No results for input(s): TSH, T4TOTAL, FREET4, T3FREE, THYROIDAB in the last 72 hours. Anemia Panel: No results for input(s): VITAMINB12, FOLATE, FERRITIN, TIBC, IRON, RETICCTPCT in the last 72 hours. Urine analysis:    Component Value Date/Time   COLORURINE YELLOW 09/21/2017 0733   APPEARANCEUR CLOUDY (A) 09/21/2017 0733   LABSPEC 1.008 09/21/2017 0733   PHURINE 7.0 09/21/2017 0733   GLUCOSEU NEGATIVE 09/21/2017 0733   HGBUR LARGE (A) 09/21/2017 0733   BILIRUBINUR NEGATIVE 09/21/2017 0733   KETONESUR NEGATIVE 09/21/2017 0733   PROTEINUR NEGATIVE 09/21/2017 0733   UROBILINOGEN 0.2 08/29/2014 2104   NITRITE NEGATIVE 09/21/2017 0733   LEUKOCYTESUR LARGE (A) 09/21/2017 0733     Radiology Studies: Reviewed images personally in health database    Scheduled Meds: . cholecalciferol  1,000 Units Oral Daily  . enoxaparin (LOVENOX) injection  40 mg Subcutaneous Q24H  . hydrocerin   Topical Daily  . meloxicam  7.5 mg Oral Daily  . vitamin E  1,000 Units Oral Daily   Continuous Infusions: . sodium chloride 75 mL/hr at 09/23/17 0307  . cefTRIAXone (ROCEPHIN)  IV 1 g (09/23/17 1218)     LOS: 2 days    Time spent: [place], MD [facility]   If 7PM-7AM, please contact night-coverage [email or web address] Password [email or web address] 09/23/2017, 12:16 PM

## 2017-09-23 NOTE — NC FL2 (Signed)
[place] MEDICAID FL2 LEVEL OF CARE SCREENING TOOL     IDENTIFICATION  Patient Name: Jerry [name] Birthdate: October 05, 1946 Sex: male Admission Date (Current Location): 09/21/2017  [facility] and [place] Number:  Herbalist and Address:  [facility], [place] [address], [place], [place] [address]      Provider Number: [id number]  Attending Physician Name and Address:  Nita Sells, MD  Relative Name and Phone Number:  Sharmon Leyden [phone]     Current Level of Care: Hospital Recommended Level of Care: [place] Prior Approval Number:    Date Approved/Denied:   PASRR Number: [id number]  Discharge Plan: SNF    Current Diagnoses: Patient Active Problem List   Diagnosis Date Noted  . Pressure injury of skin 09/22/2017  . Acute kidney injury ([place]) 09/21/2017  . UTI (urinary tract infection) 09/21/2017  . Elevated CK 09/21/2017  . Rhabdomyolysis 09/21/2017  . Staphylococcus aureus bacteremia 06/30/2014  . Generalized weakness 06/23/2014  . Chronic diastolic congestive heart failure ([place]) 06/23/2014  . HTN (hypertension) 06/23/2014  . Prostate cancer ([place]) 06/23/2014  . Presence of suprapubic catheter ([place]) 06/17/2014  . Cervical myelopathy ([place])   . Arthritis   . Urinary incontinence, functional 10/25/2012  . Bilateral leg weakness 10/25/2012  . Fall at home 10/25/2012  . Lower urinary tract infectious disease 10/25/2012  . Sleep apnea 10/25/2012  . Medically noncompliant 10/25/2012  . Obesity 10/25/2012    Orientation RESPIRATION BLADDER Height & Weight     Self, Time, Situation, Place  Normal Continent Weight: 288 lb (130.6 kg) Height:  6\' 5"  (195.6 cm)  BEHAVIORAL SYMPTOMS/MOOD NEUROLOGICAL BOWEL NUTRITION STATUS      Continent Diet  AMBULATORY STATUS COMMUNICATION OF NEEDS Skin   Limited Assist Verbally PU Stage and Appropriate Care   PU Stage 2 Dressing: (PRN dressing changes)                   Personal Care Assistance Level of Assistance  Bathing, Feeding, Dressing Bathing Assistance: Limited assistance Feeding assistance: Limited assistance Dressing Assistance: Limited assistance     Functional Limitations Info  Sight, Hearing, Speech Sight Info: Adequate Hearing Info: Adequate Speech Info: Adequate    SPECIAL CARE FACTORS FREQUENCY  PT (By licensed PT)     PT Frequency: 5x a week              Contractures Contractures Info: Not present    Additional Factors Info  Code Status, Allergies Code Status Info: Full Code Allergies Info: NKA           Current Medications (09/23/2017):  This is the current hospital active medication list Current Facility-Administered Medications  Medication Dose Route Frequency Provider Last Rate Last Dose  . acetaminophen (TYLENOL) tablet 650 mg  650 mg Oral Q6H PRN Radene Gunning, NP       Or  . acetaminophen (TYLENOL) suppository 650 mg  650 mg Rectal Q6H PRN Black, Lezlie Octave, NP      . bisacodyl (DULCOLAX) EC tablet 5 mg  5 mg Oral Daily PRN Radene Gunning, NP      . cefTRIAXone (ROCEPHIN) 1 g in dextrose 5 % 50 mL IVPB  1 g Intravenous Q24H Rumbarger, Rachel L, RPH 100 mL/hr at 09/23/17 1218 1 g at 09/23/17 1218  . cholecalciferol (VITAMIN D) tablet 1,000 Units  1,000 Units Oral Daily Lady Deutscher, MD   1,000 Units at 09/23/17 1219  . enoxaparin (LOVENOX) injection 40  mg  40 mg Subcutaneous Q24H Black, Karen M, NP   40 mg at 09/23/17 1219  . hydrALAZINE (APRESOLINE) injection 5 mg  5 mg Intravenous Q6H PRN Radene Gunning, NP      . hydrocerin (EUCERIN) cream   Topical Daily Lady Deutscher, MD      . meloxicam [facility]) tablet 7.5 mg  7.5 mg Oral Daily Radene Gunning, NP   7.5 mg at 09/23/17 1219  . ondansetron (ZOFRAN) tablet 4 mg  4 mg Oral Q6H PRN Radene Gunning, NP       Or  . ondansetron [facility]) injection 4 mg  4 mg Intravenous Q6H PRN Radene Gunning, NP      . traMADol Veat[facility]bon) tablet 50 mg  50 mg Oral Q6H  PRN Radene Gunning, NP   50 mg at 09/22/17 1725  . vitamin E capsule 1,000 Units  1,000 Units Oral Daily Radene Gunning, NP   1,000 Units at 09/23/17 1218     Discharge Medications: Please see discharge summary for a list of discharge medications.  Relevant Imaging Results:  Relevant Lab Results:   Additional Information SSN [id number]  Ross Ludwig, [place]

## 2017-09-24 ENCOUNTER — Encounter: Payer: Self-pay | Admitting: *Deleted

## 2017-09-24 LAB — CBC WITH DIFFERENTIAL/PLATELET
BASOS ABS: 0 10*3/uL (ref 0.0–0.1)
BASOS PCT: 0 %
EOS ABS: 0.2 10*3/uL (ref 0.0–0.7)
EOS PCT: 2 %
HCT: 35.6 % — ABNORMAL LOW (ref 39.0–52.0)
Hemoglobin: 11.5 g/dL — ABNORMAL LOW (ref 13.0–17.0)
Lymphocytes Relative: 19 %
Lymphs Abs: 1.8 10*3/uL (ref 0.7–4.0)
MCH: 28.5 pg (ref 26.0–34.0)
MCHC: 32.3 g/dL (ref 30.0–36.0)
MCV: 88.3 fL (ref 78.0–100.0)
Monocytes Absolute: 0.9 10*3/uL (ref 0.1–1.0)
Monocytes Relative: 9 %
Neutro Abs: 6.9 10*3/uL (ref 1.7–7.7)
Neutrophils Relative %: 70 %
PLATELETS: 295 10*3/uL (ref 150–400)
RBC: 4.03 MIL/uL — AB (ref 4.22–5.81)
RDW: 14.4 % (ref 11.5–15.5)
WBC: 9.8 10*3/uL (ref 4.0–10.5)

## 2017-09-24 MED ORDER — SULFAMETHOXAZOLE-TRIMETHOPRIM 800-160 MG PO TABS
1.0000 | ORAL_TABLET | Freq: Two times a day (BID) | ORAL | Status: DC
  Administered 2017-09-24 – 2017-09-26 (×5): 1 via ORAL
  Filled 2017-09-24 (×6): qty 1

## 2017-09-24 NOTE — Progress Notes (Signed)
Pharmacy Antibiotic Note  Jerry [name] is a 71 y.o. male admitted on 09/21/2017 with proteus mirabilis UTI.  Pharmacy has been consulted for Bactrim dosing (UC sensitive). Previously on ceftriaxone - antibiotic day #4. SCr down to 1.15 on 12/8, CrCl~85.  Plan: Bactrim 1 DS tablet PO BID Monitor renal function, K+, LOT  Height: 6\' 5"  (195.6 cm) Weight: 288 lb (130.6 kg) IBW/kg (Calculated) : 89.1  Temp (24hrs), Avg:98.7 F (37.1 C), Min:98.4 F (36.9 C), Max:99.2 F (37.3 C)  Recent Labs  Lab 09/17/17 1600 09/17/17 1608 09/21/17 0629 09/21/17 0756 09/22/17 0434  WBC 11.8*  --  19.0*  --  17.2*  CREATININE 1.13  --  1.35*  --  1.15  LATICACIDVEN  --  0.83  --  0.83  --     Estimated Creatinine Clearance: 85.5 mL/min (by C-G formula based on SCr of 1.15 mg/dL).    No Known Allergies  Elicia Lamp, PharmD, BCPS Clinical Pharmacist Clinical phone for 09/24/2017 until 3:30pm: [phone] If after 3:30pm, please call main pharmacy at: [phone] 09/24/2017 9:21 AM

## 2017-09-24 NOTE — Progress Notes (Signed)
Physical Therapy Treatment Patient Details Name: Jerry [name] MRN: [id number] DOB: 1946/01/31 Today's Date: 09/24/2017    History of Present Illness Jerry [name] is a 70 y.o. male with suprapubic catheter and history of recurrent urinary tract infections presents with inability to walk and UTI.Pt was a [facility] 3 days pta with obstructing catheter, poor flow, odor and dark colored urine, falls  and inability to ambulate. PMHx: cervical and thoracic sx, arthritis    PT Comments    Pastient was able to sit on the edge of the bed today but required significant assistance. He was motivated to stand and get to the chair but was unable to stand up with immobilizer on the left. He would benefit from +2-3 assist in order to stand. His knee pain was much more tolerable today. Her would benefit from further skilled therapy.   Follow Up Recommendations  SNF     Equipment Recommendations       Recommendations for Other Services       Precautions / Restrictions Precautions Precautions: Fall Precaution Comments: suprapubic catheter., legs very weak. Required Braces or Orthoses: Knee Immobilizer - Left Knee Immobilizer - Left: On when out of bed or walking Restrictions Weight Bearing Restrictions: No    Mobility  Bed Mobility Overal bed mobility: Needs Assistance Bed Mobility: Rolling Rolling: Max assist Sidelying to sit: Max assist       General bed mobility comments: would benefit from (+) 2 or 3. Required max a to sit up at the edge of the bed. Had significant posterior lean at times which decreased with cuing. Sat at the edge of the bed for 10 minutesd with PT with varying degrees of support.   Transfers Overall transfer level: Needs assistance Equipment used: Rolling walker (2 wheeled)   Sit to Stand: Total assist         General transfer comment: Attmepted to stand 2x but was unable to get his bottom off the mat. Difficulty using his right leg to stand.   Ambulation/Gait                 Stairs            Wheelchair Mobility    Modified Rankin (Stroke Patients Only)       Balance Overall balance assessment: History of Falls;Needs assistance Sitting-balance support: Bilateral upper extremity supported;Feet supported Sitting balance-Leahy Scale: Poor Sitting balance - Comments: varying posterior lean                                     Cognition Arousal/Alertness: Awake/alert Behavior During Therapy: WFL for tasks assessed/performed Overall Cognitive Status: Within Functional Limits for tasks assessed                                        Exercises      General Comments        Pertinent Vitals/Pain Pain Assessment: Faces Faces Pain Scale: Hurts a little bit Pain Location: left knee  Pain Descriptors / Indicators: Aching Pain Intervention(s): Limited activity within patient's tolerance    Home Living                      Prior Function            PT Goals (current goals can now be found  in the care plan section) Acute Rehab PT Goals Patient Stated Goal: to walk again PT Goal Formulation: With patient Time For Goal Achievement: 10/06/17 Potential to Achieve Goals: Fair Progress towards PT goals: Progressing toward goals    Frequency    Min 2X/week      PT Plan      Co-evaluation              AM-PAC PT "6 Clicks" Daily Activity  Outcome Measure  Difficulty turning over in bed (including adjusting bedclothes, sheets and blankets)?: Unable Difficulty moving from lying on back to sitting on the side of the bed? : Unable Difficulty sitting down on and standing up from a chair with arms (e.g., wheelchair, bedside commode, etc,.)?: Unable Help needed moving to and from a bed to chair (including a wheelchair)?: Total Help needed walking in hospital room?: Total Help needed climbing 3-5 steps with a railing? : Total 6 Click Score: 6    End of Session Equipment  Utilized During Treatment: Gait belt Activity Tolerance: Patient limited by fatigue;Other (comment)(limited by weakness) Patient left: in bed;with call bell/phone within reach Nurse Communication: Mobility status;Need for lift equipment PT Visit Diagnosis: Unsteadiness on feet (R26.81);Other abnormalities of gait and mobility (R26.89);Difficulty in walking, not elsewhere classified (R26.2) Pain - Right/Left: Left Pain - part of body: Knee     Time: 1430-1453 PT Time Calculation (min) (ACUTE ONLY): 23 min  Charges:  $Therapeutic Activity: 23-37 mins                    G Codes:         Carney Living PT DPT  09/24/2017, 4:01 PM

## 2017-09-24 NOTE — Progress Notes (Signed)
PROGRESS NOTE    Jerry [name]  FTD:[id number] DOB: [birth year] DOA: 09/21/2017 PCP: Aura Dials, MD   Specialists:     Brief Narrative:   7 male prostate ca  Cervical injury thoracis injury resulting in suprapubic cath HTn Obesity OSA on cpap Chr D HF Cervical myelopathy  admit 12/7 with probable Urinary tract infection in setting of debility  Of note Rx before for MSSA bacteremia in 2015 Also has been told that regarding his bilateral nephrolithiasis with Large L staghorn calculus he would benefit from perc nephrolothistomy  Assessment & Plan:   Principal Problem:   UTI (urinary tract infection) Active Problems:   Fall at home   Obesity   Presence of suprapubic catheter ([place])   Generalized weakness   HTN (hypertension)   Acute kidney injury ([place])   Rhabdomyolysis   Pressure injury of skin   #1.  urinary tract infection with Gram Neg rods-Providencia Retigeri, PRoteus Mirabalis suprapubic catheter was changed out 3 weeks ago.  UA =infection--NO BC unfortunately IV rocephin-->Bactrimwhcih covers both organisms for at least 10 day duration Saline lock iv  Whcite coutn down 17-->9  2. Acute kidney injury. Creatinine 1.35. Baseline unclear but chart review indicates creatinine 1.13 4 days ago.   Likely related to decreased oral intake.  -has improved to Creat 20/1.15  #3. Rhabdomyolisis. CK 1054, likely related to above.  Now down to 687 rptm CK in am  4. Fall at home/generalized weakness. Related to decreased oral intake secondary to above in setting of inability to perform activities of daily living. EKG without acute changes chest x-ray without any cardiopulmonary.  -PT consult--he is unable to flex his knee and tells me he had a severe fall  In the past 2-3 days -4 view xrays of knees--is neg, ordering MRI knee which is still pending due to lack of staff 2/2 to snow storm [place] -d/w Dr Erlinda Hong   #5. Hypertension. Controlled in the emergency  department. -resuming Lasix on d/c -monitor -prn hydralzaine  6. Chronic diastolic heart failure.compensated.  Home meds include lasix -Hold Lasix for now -Daily weights -Intake and output   lovenox Inpatient Full code probably need snf?  Consultants:   none  Procedures:   none  Antimicrobials:   rocephin    Subjective:  Awake alert fair  No distress no cp Eating drinking   Objective: Vitals:   09/23/17 1500 09/23/17 2121 09/24/17 0400 09/24/17 0952  BP: (!) 117/55 (!) 113/48 (!) 121/51 (!) 113/46  Pulse: 68 70 72 67  Resp: 16 16 16 20   Temp: 98.6 F (37 C) 98.4 F (36.9 C) 99.2 F (37.3 C) 99.3 F (37.4 C)  TempSrc: Oral  Oral Oral  SpO2: 97% 92% 91% 91%  Weight:      Height:        Intake/Output Summary (Last 24 hours) at 09/24/2017 1613 Last data filed at 09/23/2017 2120 Gross per 24 hour  Intake -  Output 950 ml  Net -950 ml   Filed Weights   09/21/17 0623  Weight: 130.6 kg (288 lb)    Examination:  eomi ncat in nad Chest clear without added sound abd soft nt nd suprapubic cath site is clean without any swelling or sign of infections No le edema Neuro intact Sacrum not examined    Data Reviewed: I have personally reviewed following labs and imaging studies  CBC: Recent Labs  Lab 09/21/17 0629 09/21/17 0743 09/22/17 0434 09/24/17 1441  WBC 19.0*  --  17.2* 9.8  NEUTROABS  --  15.8*  --  6.9  HGB 12.7*  --  11.2* 11.5*  HCT 39.0  --  34.8* 35.6*  MCV 87.4  --  87.7 88.3  PLT 249  --  259 341   Basic Metabolic Panel: Recent Labs  Lab 09/21/17 0629 09/22/17 0434  NA 134* 134*  K 3.8 3.7  CL 94* 97*  CO2 27 26  GLUCOSE 124* 121*  BUN 23* 20  CREATININE 1.35* 1.15  CALCIUM 9.0 8.7*   GFR: Estimated Creatinine Clearance: 85.5 mL/min (by C-G formula based on SCr of 1.15 mg/dL). Liver Function Tests: Recent Labs  Lab 09/21/17 0743  AST 39  ALT 19  ALKPHOS 70  BILITOT 1.1  PROT 7.4  ALBUMIN 2.7*   No  results for input(s): LIPASE, AMYLASE in the last 168 hours. No results for input(s): AMMONIA in the last 168 hours. Coagulation Profile: No results for input(s): INR, PROTIME in the last 168 hours. Cardiac Enzymes: Recent Labs  Lab 09/21/17 0743 09/22/17 0434  CKTOTAL 1,054* 687*   BNP (last 3 results) No results for input(s): PROBNP in the last 8760 hours. HbA1C: No results for input(s): HGBA1C in the last 72 hours. CBG: Recent Labs  Lab 09/21/17 1012  GLUCAP 286*   Lipid Profile: No results for input(s): CHOL, HDL, LDLCALC, TRIG, CHOLHDL, LDLDIRECT in the last 72 hours. Thyroid Function Tests: No results for input(s): TSH, T4TOTAL, FREET4, T3FREE, THYROIDAB in the last 72 hours. Anemia Panel: No results for input(s): VITAMINB12, FOLATE, FERRITIN, TIBC, IRON, RETICCTPCT in the last 72 hours. Urine analysis:    Component Value Date/Time   COLORURINE YELLOW 09/21/2017 0733   APPEARANCEUR CLOUDY (A) 09/21/2017 0733   LABSPEC 1.008 09/21/2017 0733   PHURINE 7.0 09/21/2017 0733   GLUCOSEU NEGATIVE 09/21/2017 0733   HGBUR LARGE (A) 09/21/2017 0733   BILIRUBINUR NEGATIVE 09/21/2017 0733   KETONESUR NEGATIVE 09/21/2017 0733   PROTEINUR NEGATIVE 09/21/2017 0733   UROBILINOGEN 0.2 08/29/2014 2104   NITRITE NEGATIVE 09/21/2017 0733   LEUKOCYTESUR LARGE (A) 09/21/2017 0733     Radiology Studies: Reviewed images personally in health database    Scheduled Meds: . cholecalciferol  1,000 Units Oral Daily  . enoxaparin (LOVENOX) injection  40 mg Subcutaneous Q24H  . hydrocerin   Topical Daily  . meloxicam  7.5 mg Oral Daily  . sulfamethoxazole-trimethoprim  1 tablet Oral Q12H  . vitamin E  1,000 Units Oral Daily   Continuous Infusions:    LOS: 3 days    Time spent: [place], MD [facility] ([facility]   If 7PM-7AM, please contact night-coverage [email or web address] Password [email or web address] 09/24/2017, 4:13 PM

## 2017-09-24 NOTE — Clinical Social Work Note (Signed)
Clinical Social Work Assessment  Patient Details  Name: Jerry [name] MRN: [id number] Date of Birth: 11-May-1946  Date of referral:  09/24/17               Reason for consult:  Facility Placement, Discharge Planning                Permission sought to share information with:  Facility Art therapist granted to share information::  Yes, Verbal Permission Granted  Name::        Agency::  SNFs  Relationship::     Contact Information:     Housing/Transportation Living arrangements for the past 2 months:  Single Family Home(2 level townhouse) Source of Information:  Patient Patient Interpreter Needed:  None Criminal Activity/Legal Involvement Pertinent to Current Situation/Hospitalization:  No - Comment as needed Significant Relationships:  Friend, Neighbor Lives with:  Self Do you feel safe going back to the place where you live?  Yes Need for family participation in patient care:  Yes (Comment)  Care giving concerns:  Pt lives alone in a two story town home, with no family or friends that provide help to pt. SNF recommended for safety and continued therapy support.    Social Worker assessment / plan:  CSW met with pt at bedside. Pt was friendly but a little uncomfortable during the assessment, as evidenced by his grimace on his face. Pt stated that he has not had a bowel movement in awhile and was uncomfortable but willing to talk. Pt was not familiar with "short term rehab" or "skilled nursing facility" but told CSW that he had been to [facility] and [facility] in the past. Pt lives alone and states that "I have a neighbor who will get my mail but we aren't neighbors that hang out or go into each others homes." When asked about other supports pt denies having any strong supports. Pt amenable to go to SNF, CSW let pt know that we would bring bed offers from facilities and the pt would have choice from those. CSW continuing to follow and provide support.   Employment  status:  Retired Forensic scientist:  Medicare PT Recommendations:  [place] / Referral to community resources:  [place]  Patient/Family's Response to care:  Pt amenable to SNF placement and desires to return home after when he can.   Patient/Family's Understanding of and Emotional Response to Diagnosis, Current Treatment, and Prognosis:  Pt is aware of current diagnosis and treatment as evidenced by his comments of "I fall and sit a lot so I know that I am in here for a UTI and some other treatment, and amenable to prognosis of need for continuing care at [facility].   Emotional Assessment Appearance:  Appears stated age Attitude/Demeanor/Rapport:  Guarded Affect (typically observed):  Guarded, Restless, Overwhelmed Orientation:  Oriented to Self, Oriented to Place, Oriented to  Time, Oriented to Situation Alcohol / Substance use:  Never Used Psych involvement (Current and /or in the community):  No (Comment)  Discharge Needs  Concerns to be addressed:  Discharge Planning Concerns, Care Coordination Readmission within the last 30 days:  No Current discharge risk:  Lives alone, Dependent with Mobility Barriers to Discharge:  Ship broker, Continued Medical Work up   [employer], [place] 09/24/2017, 1:10 PM

## 2017-09-25 ENCOUNTER — Other Ambulatory Visit: Payer: PRIVATE HEALTH INSURANCE | Admitting: *Deleted

## 2017-09-25 LAB — CBC WITH DIFFERENTIAL/PLATELET
Basophils Absolute: 0 10*3/uL (ref 0.0–0.1)
Basophils Relative: 0 %
Eosinophils Absolute: 0.3 10*3/uL (ref 0.0–0.7)
Eosinophils Relative: 2 %
HEMATOCRIT: 35.8 % — AB (ref 39.0–52.0)
HEMOGLOBIN: 11.5 g/dL — AB (ref 13.0–17.0)
LYMPHS ABS: 1.9 10*3/uL (ref 0.7–4.0)
Lymphocytes Relative: 17 %
MCH: 28.5 pg (ref 26.0–34.0)
MCHC: 32.1 g/dL (ref 30.0–36.0)
MCV: 88.8 fL (ref 78.0–100.0)
MONO ABS: 1.1 10*3/uL — AB (ref 0.1–1.0)
MONOS PCT: 10 %
NEUTROS ABS: 7.9 10*3/uL — AB (ref 1.7–7.7)
NEUTROS PCT: 71 %
Platelets: 313 10*3/uL (ref 150–400)
RBC: 4.03 MIL/uL — ABNORMAL LOW (ref 4.22–5.81)
RDW: 14.5 % (ref 11.5–15.5)
WBC: 11.2 10*3/uL — ABNORMAL HIGH (ref 4.0–10.5)

## 2017-09-25 LAB — URINE CULTURE: Culture: >=100000 — AB

## 2017-09-25 MED ORDER — SORBITOL 70 % SOLN
20.0000 mL | Freq: Two times a day (BID) | Status: DC
  Administered 2017-09-25 – 2017-09-26 (×2): 20 mL via ORAL
  Filled 2017-09-25 (×3): qty 30

## 2017-09-25 MED ORDER — FLEET ENEMA 7-19 GM/118ML RE ENEM
1.0000 | ENEMA | Freq: Once | RECTAL | Status: AC
  Administered 2017-09-25: 1 via RECTAL
  Filled 2017-09-25: qty 1

## 2017-09-25 NOTE — Progress Notes (Signed)
PROGRESS NOTE    Jerry [name]  XAJ:[id number] DOB: 06-07-1946 DOA: 09/21/2017 PCP: Aura Dials, MD   Specialists:     Brief Narrative:   74 male prostate ca  Cervical injury thoracis injury resulting in suprapubic cath HTn Obesity OSA on cpap Chr D HF Cervical myelopathy  admit 12/7 with probable Urinary tract infection in setting of debility  Of note Rx before for MSSA bacteremia in 2015 Also has been told that regarding his bilateral nephrolithiasis with Large L staghorn calculus he would benefit from perc nephrolothistomy  Assessment & Plan:   Principal Problem:   UTI (urinary tract infection) Active Problems:   Fall at home   Obesity   Presence of suprapubic catheter ([place])   Generalized weakness   HTN (hypertension)   Acute kidney injury ([place])   Rhabdomyolysis   Pressure injury of skin   #1.  urinary tract infection with Gram Neg rods-Providencia Retigeri, PRoteus Mirabalis suprapubic catheter was changed out 3 weeks ago.  UA =infection--NO BC unfortunately IV rocephin-->Bactrimwhcih covers both organisms for at least 10 day duration Saline lock iv  Whcite coutn down 17-->9 Resolving--would watch one more day in [place] on PO abx--he has in the past had a bacteremia which left him severely debilitated and with no BC to guide therapy, would not d/c today given him "not feeling quite himself" as per hpi  2. Acute kidney injury. Creatinine 1.35. Baseline unclear but chart review indicates creatinine 1.13 4 days ago.   Likely related to decreased oral intake.  -has improved to Creat 20/1.15  #3. Rhabdomyolisis. CK 1054, likely related to above.  Now down to 687 rptm CK in am  #4 Severe constipation No stool x 9 days? No response to dulcoclax supp Adding sorbitol 20 bid If no effect, FLeets x 1 16:00--d/w RN  4. Fall at home/generalized weakness. Related to decreased oral intake secondary to above in setting of inability to perform activities of daily  living. EKG without acute changes chest x-ray without any cardiopulmonary.  -PT consult--he is unable to flex his knee and tells me he had a severe fall  In the past 2-3 days -4 view xrays of knees--is neg, ordering MRI knee shows some medial meniscal and other meniscal derangement-d/w Dr Amaryllis Dyke Knee immob and follow -up 1 week--SNF will be able to schedule appt.   #5. Hypertension. Controlled in the emergency department. -resuming Lasix on d/c -monitor -prn hydralzaine  6. Chronic diastolic heart failure.compensated.  Home meds include lasix -Hold Lasix for now -Daily weights -Intake and output   lovenox Inpatient Full code probably need snf am 09/25/17 if stooling and no overt fever nor chills  Consultants:   none  Procedures:   none  Antimicrobials:   rocephin    Subjective:  Feels "funny" mentally is a little foggy and usually to me is sharper--today wan't able to remember something which he normally would know thinks had low grade fevers overnight No CP no n/v   Objective: Vitals:   09/24/17 0400 09/24/17 0952 09/24/17 1500 09/24/17 2143  BP: (!) 121/51 (!) 113/46 (!) 112/49 (!) 118/57  Pulse: 72 67 65 63  Resp: 16 20 18 18   Temp: 99.2 F (37.3 C) 99.3 F (37.4 C) 98.4 F (36.9 C) 98.6 F (37 C)  TempSrc: Oral Oral Oral Oral  SpO2: 91% 91% 92% 91%  Weight:      Height:        Intake/Output Summary (Last 24 hours) at 09/25/2017 1215 Last data filed  at 09/25/2017 0900 Gross per 24 hour  Intake 0 ml  Output 1000 ml  Net -1000 ml   Filed Weights   09/21/17 9417  Weight: 130.6 kg (288 lb)    Examination:  eomi ncat in nad Mild fogginess for him which is unusual Chest clear without added sound abd soft nt nd suprapubic cath site is clean without any swelling or sign of infections No le edema Neuro intact and moving limbs equally speech strong and clear Sensory intact Power 5/5 in nad Sacrum not examined    Data  Reviewed: I have personally reviewed following labs and imaging studies  CBC: Recent Labs  Lab 09/21/17 0629 09/21/17 0743 09/22/17 0434 09/24/17 1441 09/25/17 0529  WBC 19.0*  --  17.2* 9.8 11.2*  NEUTROABS  --  15.8*  --  6.9 7.9*  HGB 12.7*  --  11.2* 11.5* 11.5*  HCT 39.0  --  34.8* 35.6* 35.8*  MCV 87.4  --  87.7 88.3 88.8  PLT 249  --  259 295 408   Basic Metabolic Panel: Recent Labs  Lab 09/21/17 0629 09/22/17 0434  NA 134* 134*  K 3.8 3.7  CL 94* 97*  CO2 27 26  GLUCOSE 124* 121*  BUN 23* 20  CREATININE 1.35* 1.15  CALCIUM 9.0 8.7*   GFR: Estimated Creatinine Clearance: 85.5 mL/min (by C-G formula based on SCr of 1.15 mg/dL). Liver Function Tests: Recent Labs  Lab 09/21/17 0743  AST 39  ALT 19  ALKPHOS 70  BILITOT 1.1  PROT 7.4  ALBUMIN 2.7*   No results for input(s): LIPASE, AMYLASE in the last 168 hours. No results for input(s): AMMONIA in the last 168 hours. Coagulation Profile: No results for input(s): INR, PROTIME in the last 168 hours. Cardiac Enzymes: Recent Labs  Lab 09/21/17 0743 09/22/17 0434  CKTOTAL 1,054* 687*   BNP (last 3 results) No results for input(s): PROBNP in the last 8760 hours. HbA1C: No results for input(s): HGBA1C in the last 72 hours. CBG: Recent Labs  Lab 09/21/17 1012  GLUCAP 286*   Lipid Profile: No results for input(s): CHOL, HDL, LDLCALC, TRIG, CHOLHDL, LDLDIRECT in the last 72 hours. Thyroid Function Tests: No results for input(s): TSH, T4TOTAL, FREET4, T3FREE, THYROIDAB in the last 72 hours. Anemia Panel: No results for input(s): VITAMINB12, FOLATE, FERRITIN, TIBC, IRON, RETICCTPCT in the last 72 hours. Urine analysis:    Component Value Date/Time   COLORURINE YELLOW 09/21/2017 0733   APPEARANCEUR CLOUDY (A) 09/21/2017 0733   LABSPEC 1.008 09/21/2017 0733   PHURINE 7.0 09/21/2017 0733   GLUCOSEU NEGATIVE 09/21/2017 0733   HGBUR LARGE (A) 09/21/2017 0733   BILIRUBINUR NEGATIVE 09/21/2017 0733    KETONESUR NEGATIVE 09/21/2017 0733   PROTEINUR NEGATIVE 09/21/2017 0733   UROBILINOGEN 0.2 08/29/2014 2104   NITRITE NEGATIVE 09/21/2017 0733   LEUKOCYTESUR LARGE (A) 09/21/2017 0733     Radiology Studies: Reviewed images personally in health database    Scheduled Meds: . cholecalciferol  1,000 Units Oral Daily  . enoxaparin (LOVENOX) injection  40 mg Subcutaneous Q24H  . hydrocerin   Topical Daily  . meloxicam  7.5 mg Oral Daily  . sodium phosphate  1 enema Rectal Once  . sorbitol  20 mL Oral BID  . sulfamethoxazole-trimethoprim  1 tablet Oral Q12H  . vitamin E  1,000 Units Oral Daily   Continuous Infusions:    LOS: 4 days    Time spent: [place], MD [facility] (P)  [phone]   If 7PM-7AM, please contact night-coverage [email or web address] Password [email or web address] 09/25/2017, 12:15 PM

## 2017-09-25 NOTE — Social Work (Signed)
CSW spoke with pt at bedside regarding SNF offers. CSW provided packet of offers, pt expressed preference for [facility] for placement. CSW informed pt that they would follow up with [facility] and then let pt know whether or not they were still able to offer placement on discharge. CSW requested pt select a second choice of SNF in the case that [facility] was not able to provide a bed.   CSW followed up with MD to gauge discharge timing.  12:15pm- MD informed CSW that discharge will be tomorrow.   CSW will continue to follow and provide support with discharge when medically appropriate.   Alexander [place], [place] Work [phone]

## 2017-09-25 NOTE — Progress Notes (Signed)
Physical Therapy Treatment Patient Details Name: Jerry [name] MRN: [id number] DOB: Nov 28, 1945 Today's Date: 09/25/2017    History of Present Illness Jerry [name] is a 71 y.o. male with suprapubic catheter and history of recurrent urinary tract infections presents with inability to walk and UTI.Pt was a [facility] 3 days pta with obstructing catheter, poor flow, odor and dark colored urine, falls  and inability to ambulate. PMHx: cervical and thoracic sx, arthritis    PT Comments    Continuing work on functional mobility and activity tolerance;  Opted for use of mechanical lift for OOB to chair for initial transfer OOB; Jerry [name] seemed pleased to get  OOB; he was also a bit preoccupied with possible need to move his bowels; Very motivated -- I anticipate good participation at post-acute rehab   Follow Up Recommendations  SNF     Equipment Recommendations  Wheelchair (measurements PT)    Recommendations for Other Services       Precautions / Restrictions Precautions Precautions: Fall Precaution Comments: suprapubic catheter., legs very weak. Required Braces or Orthoses: Knee Immobilizer - Left Knee Immobilizer - Left: On when out of bed or walking    Mobility  Bed Mobility Overal bed mobility: Needs Assistance Bed Mobility: Supine to Sit     Supine to sit: Max assist;+2 for physical assistance     General bed mobility comments: would benefit from (+) 2 or 3. Required max a to sit up at the edge of the bed. Had significant posterior lean at times which decreased with cuing. Sat at the edge of the bed for 5-8 minutes with PT/techs with varying degrees of support.   Transfers Overall transfer level: Needs assistance               General transfer comment: Opted to use mechanical lift for initial transfer OOB to chair for safety and a solid plan for nursing to assist Jerry [name] back to bed; He tolerated the OOB to chair transfer well  Ambulation/Gait                  Stairs            Wheelchair Mobility    Modified Rankin (Stroke Patients Only)       Balance     Sitting balance-Leahy Scale: Poor Sitting balance - Comments: varying posterior lean, and at times leaning posteriorly and to R to elbow prop Postural control: Posterior lean                                  Cognition Arousal/Alertness: Awake/alert Behavior During Therapy: WFL for tasks assessed/performed Overall Cognitive Status: Within Functional Limits for tasks assessed                                        Exercises      General Comments        Pertinent Vitals/Pain Pain Assessment: Faces Faces Pain Scale: Hurts a little bit Pain Location: left knee  Pain Descriptors / Indicators: Aching Pain Intervention(s): Monitored during session    Home Living                      Prior Function            PT Goals (current goals can now be found in the  care plan section) Acute Rehab PT Goals Patient Stated Goal: to walk again PT Goal Formulation: With patient Time For Goal Achievement: 10/06/17 Potential to Achieve Goals: Fair Progress towards PT goals: Progressing toward goals    Frequency    Min 2X/week      PT Plan Current plan remains appropriate    Co-evaluation              AM-PAC PT "6 Clicks" Daily Activity  Outcome Measure  Difficulty turning over in bed (including adjusting bedclothes, sheets and blankets)?: Unable Difficulty moving from lying on back to sitting on the side of the bed? : Unable Difficulty sitting down on and standing up from a chair with arms (e.g., wheelchair, bedside commode, etc,.)?: Unable Help needed moving to and from a bed to chair (including a wheelchair)?: Total Help needed walking in hospital room?: Total Help needed climbing 3-5 steps with a railing? : Total 6 Click Score: 6    End of Session Equipment Utilized During Treatment: Other  (comment)([place]) Activity Tolerance: Patient tolerated treatment well Patient left: in chair;with call bell/phone within reach Nurse Communication: Mobility status;Need for lift equipment PT Visit Diagnosis: Unsteadiness on feet (R26.81);Other abnormalities of gait and mobility (R26.89);Difficulty in walking, not elsewhere classified (R26.2) Pain - Right/Left: Left Pain - part of body: Knee     Time: 1420-1447 PT Time Calculation (min) (ACUTE ONLY): 27 min  Charges:  $Therapeutic Activity: 23-37 mins                    G Codes:       Roney [place], PT  Acute Rehabilitation Services Pager [phone] Office [phone]    [place] 09/25/2017, 4:08 PM

## 2017-09-25 NOTE — Patient Outreach (Addendum)
[place] [facility]) Care Management  09/25/2017  Jerry [name] 1946/10/11 [id number]   CSW made an initial attempt to try and contact patient today to perform phone assessment, as well as assess and assist with social needs and services, without success.  A HIPAA compliant message was left for patient on voicemail.  CSW is currently awaiting a return call. CSW will make a second outreach attempt within the next week, if CSW does not receive a return call from patient in the meantime. CSW noted that patient is currently hospitalized for weakness and Urinary Tract Infection with Hematuria.  CSW will continue to follow patient while hospitalized, then provide community case management services, once discharged. Nat Christen, BSW, MSW, LCSW  Licensed Education officer, [facility]  Mailing [place] [address] [place], [place] [address] Physical Address-[address], [place],  [address] Toll Free Main # [phone] Fax # [phone] Cell # [phone]  Office # [phone] [email or web address]

## 2017-09-26 DIAGNOSIS — G8911 Acute pain due to trauma: Secondary | ICD-10-CM | POA: Diagnosis not present

## 2017-09-26 DIAGNOSIS — N390 Urinary tract infection, site not specified: Secondary | ICD-10-CM | POA: Diagnosis not present

## 2017-09-26 DIAGNOSIS — Z9359 Other cystostomy status: Secondary | ICD-10-CM | POA: Diagnosis not present

## 2017-09-26 DIAGNOSIS — R638 Other symptoms and signs concerning food and fluid intake: Secondary | ICD-10-CM | POA: Diagnosis not present

## 2017-09-26 DIAGNOSIS — R5081 Fever presenting with conditions classified elsewhere: Secondary | ICD-10-CM | POA: Diagnosis not present

## 2017-09-26 DIAGNOSIS — Y92009 Unspecified place in unspecified non-institutional (private) residence as the place of occurrence of the external cause: Secondary | ICD-10-CM

## 2017-09-26 DIAGNOSIS — R509 Fever, unspecified: Secondary | ICD-10-CM | POA: Diagnosis not present

## 2017-09-26 DIAGNOSIS — B965 Pseudomonas (aeruginosa) (mallei) (pseudomallei) as the cause of diseases classified elsewhere: Secondary | ICD-10-CM | POA: Diagnosis not present

## 2017-09-26 DIAGNOSIS — M25562 Pain in left knee: Secondary | ICD-10-CM | POA: Diagnosis not present

## 2017-09-26 DIAGNOSIS — T83510S Infection and inflammatory reaction due to cystostomy catheter, sequela: Secondary | ICD-10-CM | POA: Diagnosis not present

## 2017-09-26 DIAGNOSIS — I10 Essential (primary) hypertension: Secondary | ICD-10-CM | POA: Diagnosis not present

## 2017-09-26 DIAGNOSIS — N200 Calculus of kidney: Secondary | ICD-10-CM | POA: Diagnosis not present

## 2017-09-26 DIAGNOSIS — N179 Acute kidney failure, unspecified: Secondary | ICD-10-CM | POA: Diagnosis not present

## 2017-09-26 DIAGNOSIS — E559 Vitamin D deficiency, unspecified: Secondary | ICD-10-CM | POA: Diagnosis not present

## 2017-09-26 DIAGNOSIS — T796XXD Traumatic ischemia of muscle, subsequent encounter: Secondary | ICD-10-CM | POA: Diagnosis not present

## 2017-09-26 DIAGNOSIS — S83282D Other tear of lateral meniscus, current injury, left knee, subsequent encounter: Secondary | ICD-10-CM | POA: Diagnosis not present

## 2017-09-26 DIAGNOSIS — R488 Other symbolic dysfunctions: Secondary | ICD-10-CM | POA: Diagnosis not present

## 2017-09-26 DIAGNOSIS — I5032 Chronic diastolic (congestive) heart failure: Secondary | ICD-10-CM | POA: Diagnosis not present

## 2017-09-26 DIAGNOSIS — R531 Weakness: Secondary | ICD-10-CM

## 2017-09-26 DIAGNOSIS — N1330 Unspecified hydronephrosis: Secondary | ICD-10-CM | POA: Diagnosis not present

## 2017-09-26 DIAGNOSIS — W19XXXD Unspecified fall, subsequent encounter: Secondary | ICD-10-CM

## 2017-09-26 DIAGNOSIS — E538 Deficiency of other specified B group vitamins: Secondary | ICD-10-CM | POA: Diagnosis not present

## 2017-09-26 DIAGNOSIS — R399 Unspecified symptoms and signs involving the genitourinary system: Secondary | ICD-10-CM | POA: Diagnosis not present

## 2017-09-26 DIAGNOSIS — N312 Flaccid neuropathic bladder, not elsewhere classified: Secondary | ICD-10-CM | POA: Diagnosis not present

## 2017-09-26 DIAGNOSIS — M6282 Rhabdomyolysis: Secondary | ICD-10-CM

## 2017-09-26 DIAGNOSIS — N319 Neuromuscular dysfunction of bladder, unspecified: Secondary | ICD-10-CM | POA: Diagnosis not present

## 2017-09-26 DIAGNOSIS — S83242D Other tear of medial meniscus, current injury, left knee, subsequent encounter: Secondary | ICD-10-CM | POA: Diagnosis not present

## 2017-09-26 DIAGNOSIS — T83511D Infection and inflammatory reaction due to indwelling urethral catheter, subsequent encounter: Secondary | ICD-10-CM | POA: Diagnosis not present

## 2017-09-26 DIAGNOSIS — Z9181 History of falling: Secondary | ICD-10-CM | POA: Diagnosis not present

## 2017-09-26 DIAGNOSIS — L89620 Pressure ulcer of left heel, unstageable: Secondary | ICD-10-CM | POA: Diagnosis not present

## 2017-09-26 DIAGNOSIS — B964 Proteus (mirabilis) (morganii) as the cause of diseases classified elsewhere: Secondary | ICD-10-CM | POA: Diagnosis not present

## 2017-09-26 DIAGNOSIS — T83510A Infection and inflammatory reaction due to cystostomy catheter, initial encounter: Secondary | ICD-10-CM | POA: Diagnosis not present

## 2017-09-26 DIAGNOSIS — M6281 Muscle weakness (generalized): Secondary | ICD-10-CM | POA: Diagnosis not present

## 2017-09-26 DIAGNOSIS — L89622 Pressure ulcer of left heel, stage 2: Secondary | ICD-10-CM | POA: Diagnosis not present

## 2017-09-26 DIAGNOSIS — R2681 Unsteadiness on feet: Secondary | ICD-10-CM | POA: Diagnosis not present

## 2017-09-26 DIAGNOSIS — L89153 Pressure ulcer of sacral region, stage 3: Secondary | ICD-10-CM | POA: Diagnosis not present

## 2017-09-26 MED ORDER — SENNA 8.6 MG PO TABS: 2.0000 | ORAL_TABLET | Freq: Every evening | ORAL | Status: AC | PRN

## 2017-09-26 MED ORDER — SULFAMETHOXAZOLE-TRIMETHOPRIM 800-160 MG PO TABS
1.0000 | ORAL_TABLET | Freq: Two times a day (BID) | ORAL | Status: DC
Start: 2017-09-26 — End: 2017-10-31

## 2017-09-26 MED ORDER — VITAMIN B-12 1000 MCG PO TABS: 2000.0000 ug | ORAL_TABLET | Freq: Every day | ORAL | Status: AC

## 2017-09-26 MED ORDER — POLYETHYLENE GLYCOL 3350 17 G PO PACK: 17.0000 g | PACK | Freq: Two times a day (BID) | ORAL | Status: AC

## 2017-09-26 MED ORDER — TRAMADOL HCL 50 MG PO TABS: 50.0000 mg | ORAL_TABLET | Freq: Four times a day (QID) | ORAL | 0 refills | Status: DC | PRN

## 2017-09-26 NOTE — Discharge Summary (Addendum)
Physician Discharge Summary  Jackob Crookston RFF:[id number] DOB: October 07, 1946  PCP: Aura Dials, MD  Admit date: 09/21/2017 Discharge date: 09/26/2017  Recommendations for Outpatient Follow-up:  1. M.D. at SNF in 2 days with repeat labs (CBC, BMP & CK).  2. Dr. Aura Dials, PCP upon discharge from SNF. 3. Dr. Rosario Adie, Urology upon discharge from SNF. 4. Dr. Eduard Roux, Orthopedics in 1 week. SNF to coordinate. 5. Patient is on multiple vitamin supplements, some of them duplications (i.e. vitamin D and calcium) which patient insists that he takes daily. Needs to follow-up with PCP regarding need to continue all of these medications or some of these can be discontinued.  Home Health: None  Equipment/Devices: Patient has chronic indwelling suprapubic catheter which apparently were changed 2 weeks prior to this admission and is usually changed every month.    Discharge Condition: Improved and stable  CODE STATUS: Full  Diet recommendation: Heart healthy diet.  Discharge Diagnoses:  Principal Problem:   UTI (urinary tract infection) Active Problems:   Fall at home   Obesity   Presence of suprapubic catheter ([place])   Generalized weakness   HTN (hypertension)   Acute kidney injury ([place])   Rhabdomyolysis   Pressure injury of skin   Brief Summary: 71 year old male, lives alone, ambulates with the help of a walker, PMH of recurrent UTIs, 2 previous admissions this year for sepsis related to UTI, neurogenic bladder status post suprapubic catheter for the last approximately 4 years and follows with outpatient urology, prostate cancer, HTN, obesity, chronic diastolic CHF, cervical myelopathy, OSA not on CPAP, presented to the ED with complaints of generalized weakness and fatigue that started approximately 6 days prior to admission. His usual UTI symptoms include change of urine color to cloudy, foul smell, increased sediment and difficulty draining from the suprapubic catheter with  associated chills but no real fevers. He reports some of these symptoms PTA. On day of admission, he was so weak that he was unable to get out of his chair. He reports that on day of admission he was quite weak and firefighters came and assisted him to go to the bathroom where his left leg "gave way" and he fell down without sustaining injury to his head or neck or loss of consciousness. He was able to get up by himself and ambulate with his walker to his chair but was extremely weak. A couple days prior to admission, he had some weakness, EMS and assisted him at home, initially declined hospital visit then subsequently seen at [facility] long ED and discharged home with home health. Constipation +. He was admitted for evaluation and management of complicated UTI, acute kidney injury and rhabdomyolysis.   Assessment and plan:  1. Complicated UTI/catheter associated UTI: History of recurrent UTIs likely related to his indwelling suprapubic catheter. Apparently changed 2-3 weeks prior to admission. Started empirically on IV ceftriaxone pending urine culture results. Urine culture confirms >100 K colonies per mL Providencia Rettgeri and Proteus mirabilis. It also shows 80 K colonies per mL of pseudomonas aeruginosa which may be a colonizer and so maybe the other 2. He was transitioned to oral Bactrim and today is day 6 of antibiotics. I discussed with infectious disease physician on call who recommends completing total 7-10 days of Bactrim. Thereby will plan to complete a total 10 days of antibiotics with Bactrim on 09/30/17. Suprapubic catheter management as per outpatient urology. As per chart review, patient was treated for MSSA bacteremia in 2015. He has also been told  regarding bilateral nephrolithiasis with large left staghorn calculus for which she would benefit from percutaneous nephrolithotomy, defer to outpatient urology follow-up. 2. Acute kidney injury: Presented with creatinine of 1.35. Baseline  creatinine unclear but had creatinine of 1.13 on 12/3. Resolved with IV fluids. Monitor BMP periodically as outpatient. 3. Rhabdomyolysis: CK on admission 1054. Likely secondary to fall at home. This improved to 687. Follow CK and a couple days as outpatient. Encouraged oral fluid intake. 4. Fall at home/generalized weakness: Likely related to UTI and poor oral intake. PT recommends rehabilitation at SNF. 5. Essential hypertension: Not on antihypertensives except oral Lasix at home. This was temporarily held in the hospital and will be resumed at discharge. Follow BMP periodically. 6. Chronic diastolic CHF: Clinically compensated. Lasix was temporarily held which will be resumed at discharge. 7. Constipation: Patient reports that he gets constipated whenever he is on antibiotics. Reported no BM for 11 days until a large BM after enema last night. Initiated bowel regimen. This can be further adjusted at SNF. 8. Left knee medial and lateral meniscal Tears: As per PT evaluation, he was unable to flex his left knee and patient also had reported a fall at home PTA. Dr. Verlon Au discussed with Dr. Eduard Roux, Orthopedics who recommended nonoperative treatment including left knee immobilizer to be worn during ambulation, weightbearing as tolerated on left lower extremity and follow-up with Dr. Erlinda Hong in 1 week. 9. Glucose intolerance: Last A1c on 05/13/2007:6.3. Not on diabetic medications PTA. Consider repeating A1c during outpatient follow-up. 10. Anemia: Baseline hemoglobin may be in the mid 12 g range. Current mild anemia may be dilutional. Stable. Follow CBCs as outpatient.   Consultations:  None   Procedures:  None   Discharge Instructions  Discharge Instructions    (HEART FAILURE PATIENTS) Call MD:  Anytime you have any of the following symptoms: 1) 3 pound weight gain in 24 hours or 5 pounds in 1 week 2) shortness of breath, with or without a dry hacking cough 3) swelling in the hands, feet or  stomach 4) if you have to sleep on extra pillows at night in order to breathe.   Complete by:  As directed    Call MD for:  extreme fatigue   Complete by:  As directed    Call MD for:  persistant dizziness or light-headedness   Complete by:  As directed    Call MD for:  redness, tenderness, or signs of infection (pain, swelling, redness, odor or green/yellow discharge around incision site)   Complete by:  As directed    Call MD for:  severe uncontrolled pain   Complete by:  As directed    Call MD for:  temperature >100.4   Complete by:  As directed    Diet - low sodium heart healthy   Complete by:  As directed    Increase activity slowly   Complete by:  As directed        Medication List    STOP taking these medications   docusate sodium 100 MG capsule Commonly known as:  COLACE   naproxen sodium 220 MG tablet Commonly known as:  ALEVE     TAKE these medications   CALCIUM 1000 + D PO Take 1 tablet by mouth daily.   CALCIUM PO Take 1,000 mg by mouth daily.   CO Q 10 PO Take 600 mg by mouth daily.   furosemide 20 MG tablet Commonly known as:  LASIX TK 1 T PO QD   GLUCOSAMINE  CHONDR COMPLEX PO Take 600 mg by mouth daily.   meloxicam 7.5 MG tablet Commonly known as:  MOBIC Take 7.5 mg by mouth daily.   MULTIVITAMIN PO Take 1 tablet by mouth daily.   polyethylene glycol packet Commonly known as:  MIRALAX Take 17 g by mouth 2 (two) times daily.   POMEGRANATE PO Take 1 tablet by mouth daily.   RESVERATROL PO Take 600 mg by mouth daily.   senna 8.6 MG Tabs tablet Commonly known as:  SENOKOT Take 2 tablets (17.2 mg total) by mouth at bedtime as needed for mild constipation or moderate constipation.   sulfamethoxazole-trimethoprim 800-160 MG tablet Commonly known as:  BACTRIM DS,SEPTRA DS Take 1 tablet by mouth 2 (two) times daily. Discontinue after 09/30/2017 doses.   traMADol 50 MG tablet Commonly known as:  ULTRAM Take 1 tablet (50 mg total) by mouth  every 6 (six) hours as needed for moderate pain.   vitamin B-12 1000 MCG tablet Commonly known as:  CYANOCOBALAMIN Take 2 tablets (2,000 mcg total) by mouth daily. What changed:  how much to take   Vitamin D3 10000 units capsule Take 10,000 Units by mouth daily.   vitamin E 1000 UNIT capsule Take 1,000 Units by mouth daily.   WATER PILL/POTASSIUM Tabs Generic drug:  Buchu-Junip-K Gluc-Pars-Uva Ur Take 1 tablet by mouth daily.      Follow-up Information    Aura Dials, MD. Schedule an appointment as soon as possible for a visit.   Specialty:  Family Medicine Why:  Upon discharge from SNF. Contact information: [address], [address] [place] [address] [phone]        M.D. at SNF. Schedule an appointment as soon as possible for a visit in 2 day(s).   Why:  To be seen with repeat labs (CBC, BMP & CK).       Ulyses Southward., MD. Schedule an appointment as soon as possible for a visit.   Specialty:  Urology Why:  Upon discharge from SNF. Follows up regularly for change of suprapubic catheter. Contact information: [phone] [facility] [place] [address] [phone]        Leandrew Koyanagi, MD. Schedule an appointment as soon as possible for a visit in 1 week(s).   Specialty:  Orthopedic Surgery Why:  SNF to coordinate. Contact information: [place] [address] [phone]          No Known Allergies    Procedures/Studies:   Mr Knee Left Wo Contrast  Result Date: 09/23/2017 CLINICAL DATA:  Right knee pain. History of fall 09/16/2017. The patient is unable to flex his right knee. EXAM: MRI OF THE LEFT KNEE WITHOUT CONTRAST TECHNIQUE: Multiplanar, multisequence MR imaging of the knee was performed. No intravenous contrast was administered. COMPARISON:  Plain films right knee 09/22/2017. FINDINGS: MENISCI Medial meniscus: Horizontal tear in the posterior horn reaches the meniscal undersurface.  Lateral meniscus: Tearing in the posterior horn includes a horizontal component reaching the meniscal undersurface and a radial component along the central aspect of the free edge. Horizontal tear extends throughout the body of the meniscus with marked fraying along the free edge. LIGAMENTS Cruciates:  Intact. Collaterals:  Intact. CARTILAGE Patellofemoral:  Preserved. Medial:  Preserved. Lateral:  Preserved. Joint:  Small to moderate joint effusion. Popliteal Fossa:  No Baker's cyst. Extensor Mechanism:  Intact. Bones:  No fracture, contusion or worrisome lesion. Other: There is marked fatty atrophy of the gastrocnemius musculature bilaterally. A milder  degree of fatty atrophy of the semimembranosus and biceps femoris muscles is also identified. IMPRESSION: No finding to explain the patient's inability to flex his knee. Tears of the medial and lateral menisci as described above. Fatty atrophy of musculature about the knee as described above is likely related to disuse. Electronically Signed   By: Inge Rise M.D.   On: 09/23/2017 12:24   Dg Chest Port 1 View  Result Date: 09/21/2017 CLINICAL DATA:  Weakness. EXAM: PORTABLE CHEST 1 VIEW COMPARISON:  Radiographs of September 17, 2017. FINDINGS: The heart size and mediastinal contours are within normal limits. Both lungs are clear. Stable elevated right hemidiaphragm is noted. No pneumothorax or pleural effusion is noted. The visualized skeletal structures are unremarkable. IMPRESSION: No acute cardiopulmonary abnormality seen. Electronically Signed   By: Marijo Conception, M.D.   On: 09/21/2017 07:34   Dg Knee Complete 4 Views Left  Result Date: 09/22/2017 CLINICAL DATA:  LEFT knee pain EXAM: LEFT KNEE - COMPLETE 4+ VIEW COMPARISON:  None FINDINGS: Osseous demineralization. Medial compartment joint space narrowing. No acute fracture, dislocation, or bone destruction. No knee joint effusion. IMPRESSION: Mild degenerative changes and osseous demineralization  without acute bony abnormality. Electronically Signed   By: Lavonia Dana M.D.   On: 09/22/2017 14:49      Subjective: Reports generalized weakness but improving since admission. Reports being constipated for 11 days until a large BM after enema last night and since then feeling significantly better. No nausea, vomiting, abdominal pain reported. Tolerating diet.  Discharge Exam:  Vitals:   09/24/17 2143 09/25/17 1415 09/25/17 2027 09/26/17 0612  BP: (!) 118/57 (!) 104/49 (!) 123/47 (!) 112/52  Pulse: 63 66 79 67  Resp: 18 16 18 18   Temp: 98.6 F (37 C) (!) 97.5 F (36.4 C) 98.6 F (37 C) 98.2 F (36.8 C)  TempSrc: Oral Oral Oral Oral  SpO2: 91% 93% 92% 93%  Weight:      Height:        General: Pt lying comfortably in bed & appears in no obvious distress. Cardiovascular: S1 & S2 heard, RRR, S1/S2 +. No murmurs, rubs, gallops or clicks. No JVD or pedal edema. Respiratory: Clear to auscultation without wheezing, rhonchi or crackles. No increased work of breathing. Abdominal:  Non distended, non tender & soft. No organomegaly or masses appreciated. Normal bowel sounds heard. Suprapubic catheter site appears intact without acute findings. CNS: Alert and oriented. No focal deficits. Extremities: no edema, no cyanosis. Left knee exam without acute findings. Upper extremities grade 5 x 5 power. Lower extremities at least grade 3-4 x 5 power symmetrically without focal deficits. Suspect some subjective weakness and patient not providing full cooperation.    The results of significant diagnostics from this hospitalization (including imaging, microbiology, ancillary and laboratory) are listed below for reference.     Microbiology: Recent Results (from the past 240 hour(s))  Urine culture     Status: Abnormal   Collection Time: 09/21/17  7:35 AM  Result Value Ref Range Status   Specimen Description URINE, SUPRAPUBIC  Final   Special Requests NONE  Final   Culture (A)  Final     >=100,000 COLONIES/mL PROVIDENCIA RETTGERI >=100,000 COLONIES/mL PROTEUS MIRABILIS 80,000 COLONIES/mL PSEUDOMONAS AERUGINOSA    Report Status 09/25/2017 FINAL  Final   Organism ID, Bacteria PROTEUS MIRABILIS (A)  Final   Organism ID, Bacteria PROVIDENCIA RETTGERI (A)  Final   Organism ID, Bacteria PSEUDOMONAS AERUGINOSA (A)  Final  Susceptibility   Pseudomonas aeruginosa - MIC*    CEFTAZIDIME 2 SENSITIVE Sensitive     CIPROFLOXACIN <=0.25 SENSITIVE Sensitive     GENTAMICIN <=1 SENSITIVE Sensitive     IMIPENEM 1 SENSITIVE Sensitive     PIP/TAZO <=4 SENSITIVE Sensitive     CEFEPIME <=1 SENSITIVE Sensitive     * 80,000 COLONIES/mL PSEUDOMONAS AERUGINOSA   Proteus mirabilis - MIC*    AMPICILLIN <=2 SENSITIVE Sensitive     CEFAZOLIN <=4 SENSITIVE Sensitive     CEFTRIAXONE <=1 SENSITIVE Sensitive     CIPROFLOXACIN 2 INTERMEDIATE Intermediate     GENTAMICIN <=1 SENSITIVE Sensitive     IMIPENEM <=0.25 SENSITIVE Sensitive     NITROFURANTOIN 128 RESISTANT Resistant     TRIMETH/SULFA <=20 SENSITIVE Sensitive     AMPICILLIN/SULBACTAM <=2 SENSITIVE Sensitive     PIP/TAZO <=4 SENSITIVE Sensitive     * >=100,000 COLONIES/mL PROTEUS MIRABILIS   Providencia rettgeri - MIC*    AMPICILLIN >=32 RESISTANT Resistant     CEFAZOLIN >=64 RESISTANT Resistant     CEFTRIAXONE <=1 SENSITIVE Sensitive     CIPROFLOXACIN <=0.25 SENSITIVE Sensitive     GENTAMICIN <=1 SENSITIVE Sensitive     IMIPENEM 1 SENSITIVE Sensitive     NITROFURANTOIN 128 RESISTANT Resistant     TRIMETH/SULFA <=20 SENSITIVE Sensitive     AMPICILLIN/SULBACTAM 8 SENSITIVE Sensitive     PIP/TAZO <=4 SENSITIVE Sensitive     * >=100,000 COLONIES/mL PROVIDENCIA RETTGERI     Labs: CBC: Recent Labs  Lab 09/21/17 0629 09/21/17 0743 09/22/17 0434 09/24/17 1441 09/25/17 0529  WBC 19.0*  --  17.2* 9.8 11.2*  NEUTROABS  --  15.8*  --  6.9 7.9*  HGB 12.7*  --  11.2* 11.5* 11.5*  HCT 39.0  --  34.8* 35.6* 35.8*  MCV 87.4  --  87.7  88.3 88.8  PLT 249  --  259 295 604   Basic Metabolic Panel: Recent Labs  Lab 09/21/17 0629 09/22/17 0434  NA 134* 134*  K 3.8 3.7  CL 94* 97*  CO2 27 26  GLUCOSE 124* 121*  BUN 23* 20  CREATININE 1.35* 1.15  CALCIUM 9.0 8.7*   Liver Function Tests: Recent Labs  Lab 09/21/17 0743  AST 39  ALT 19  ALKPHOS 70  BILITOT 1.1  PROT 7.4  ALBUMIN 2.7*   BNP (last 3 results) Recent Labs    09/17/17 1600  BNP 35.6   Cardiac Enzymes: Recent Labs  Lab 09/21/17 0743 09/22/17 0434  CKTOTAL 1,054* 687*   CBG: Recent Labs  Lab 09/21/17 1012  GLUCAP 286*   Urinalysis    Component Value Date/Time   COLORURINE YELLOW 09/21/2017 0733   APPEARANCEUR CLOUDY (A) 09/21/2017 0733   LABSPEC 1.008 09/21/2017 0733   PHURINE 7.0 09/21/2017 0733   GLUCOSEU NEGATIVE 09/21/2017 0733   HGBUR LARGE (A) 09/21/2017 0733   BILIRUBINUR NEGATIVE 09/21/2017 0733   KETONESUR NEGATIVE 09/21/2017 0733   PROTEINUR NEGATIVE 09/21/2017 0733   UROBILINOGEN 0.2 08/29/2014 2104   NITRITE NEGATIVE 09/21/2017 0733   LEUKOCYTESUR LARGE (A) 09/21/2017 0733      Time coordinating discharge: Over 30 minutes  SIGNED:  Vernell Leep, MD, FACP, [facility]. [facility] Pager [phone]  If 7PM-7AM, please contact night-coverage [email or web address] Password [facility] 09/26/2017, 12:15 PM

## 2017-09-26 NOTE — Social Work (Signed)
Clinical Social Worker facilitated patient discharge including contacting patient family and facility to confirm patient discharge plans.  Clinical information faxed to facility and family agreeable with plan.  CSW arranged ambulance transport via [facility] to [facility].  RN to call [phone] with report prior to discharge.  Clinical Social Worker will sign off for now as social work intervention is no longer needed. Please consult [place] again if new need arises.  Alexander Mt, [place] Social Worker

## 2017-09-26 NOTE — Progress Notes (Signed)
Pt was discharged to [facility] via [place].  Report Given to facility's nurse.  Discharge instruction package sent with EMS.

## 2017-09-26 NOTE — Care Management Important Message (Signed)
Important Message  Patient Details  Name: Jerry [name] MRN: [id number] Date of Birth: Sep 12, 1946   Medicare Important Message Given:  Yes    Orbie Pyo 09/26/2017, 2:34 PM

## 2017-09-26 NOTE — Discharge Instructions (Signed)

## 2017-09-27 ENCOUNTER — Encounter: Payer: Self-pay | Admitting: Internal Medicine

## 2017-09-27 ENCOUNTER — Non-Acute Institutional Stay (SKILLED_NURSING_FACILITY): Payer: Medicare Other | Admitting: Internal Medicine

## 2017-09-27 DIAGNOSIS — R531 Weakness: Secondary | ICD-10-CM

## 2017-09-27 DIAGNOSIS — N390 Urinary tract infection, site not specified: Secondary | ICD-10-CM | POA: Diagnosis not present

## 2017-09-27 DIAGNOSIS — N179 Acute kidney failure, unspecified: Secondary | ICD-10-CM | POA: Diagnosis not present

## 2017-09-27 DIAGNOSIS — B964 Proteus (mirabilis) (morganii) as the cause of diseases classified elsewhere: Secondary | ICD-10-CM

## 2017-09-27 DIAGNOSIS — S83242D Other tear of medial meniscus, current injury, left knee, subsequent encounter: Secondary | ICD-10-CM | POA: Diagnosis not present

## 2017-09-27 DIAGNOSIS — T796XXD Traumatic ischemia of muscle, subsequent encounter: Secondary | ICD-10-CM | POA: Diagnosis not present

## 2017-09-27 DIAGNOSIS — S83282D Other tear of lateral meniscus, current injury, left knee, subsequent encounter: Secondary | ICD-10-CM | POA: Diagnosis not present

## 2017-09-27 DIAGNOSIS — E538 Deficiency of other specified B group vitamins: Secondary | ICD-10-CM | POA: Diagnosis not present

## 2017-09-27 DIAGNOSIS — I10 Essential (primary) hypertension: Secondary | ICD-10-CM

## 2017-09-27 DIAGNOSIS — W19XXXD Unspecified fall, subsequent encounter: Secondary | ICD-10-CM | POA: Diagnosis not present

## 2017-09-27 DIAGNOSIS — T83510S Infection and inflammatory reaction due to cystostomy catheter, sequela: Secondary | ICD-10-CM | POA: Diagnosis not present

## 2017-09-27 DIAGNOSIS — I5032 Chronic diastolic (congestive) heart failure: Secondary | ICD-10-CM | POA: Diagnosis not present

## 2017-09-27 NOTE — Progress Notes (Signed)
: Provider:  Noah Delaine. Sheppard Coil, MD Location:  [facility] Room Number: 108 Place of Service:  SNF [phone]  PCP: Aura Dials, MD Patient Care Team: Aura Dials, MD as PCP - General (Family Medicine) Tobi Bastos, RN as [place] [facility] [place], Maree Erie, LCSW as [place] [facility] Ulyses Southward., MD as Consulting Physician (Urology) Leandrew Koyanagi, MD as Consulting Physician (Orthopedic Surgery)  Extended Emergency Contact Information Primary Emergency Contact: Wylie Hail [address] [place] Phone: [phone] Relation: Friend     Allergies: Patient has no known allergies.  Chief Complaint  Patient presents with  . New Admit To SNF    following hospitalization 09/21/17 to 09/26/17 weakness, fatigue, fell at home, UTI.     HPI: Patient is 71 y.o. male with history of prostate cancer, neurogenic bladder status post suprapubic catheter, hypertension, obesity, chronic diastolic heart failure, who came to the emergency department with chief complaint of 5 days generalized weakness to the point that he could not even walk to the bathroom. He says he's had a moderate drink but little food and no bowel movement. He denied fever chills headache dizziness syncope or near syncope, chest pain palpitations shortness of breath, abdominal pain nausea vomiting diarrhea. He does state his urine is darker in the usual means he has a urinary tract infection. Patient was admitted from [facility] from 12/7-12 for acute UTI, acute kidney injury and rhabdomyolysis. Patient was treated initially with Rocephin then transitioned to Bactrim after antibiotic sensitivities returned on urine for providencia and Proteus. Patient's acute kidney injury was resolved with IV fluids as well as his rhabdomyolysis with a CK on admission of 1054. Hospital course was further complicated by  the finding of left knee medial and lateral meniscal tears secondary to his fall at home. Patient was placed in a knee immobilizer to be worn during ambulation with weightbearing as tolerated. Patient is admitted at skilled nursing facility with generalized weakness for OT/PT. While at skilled nursing facility patient will be followed for hypertension treated with Lasix, chronic diastolic congestive heart failure, treated with Lasix and vitamin B12 deficiency treated with replacement.  Past Medical History:  Diagnosis Date  . Acute kidney injury ([place]) 09/21/2017  . Arthritis    "hands, knees, back, legs" (06/10/2014)  . Atonic neurogenic bladder 06/30/2016  . Bilateral leg weakness 10/25/2012  . Cervical myelopathy ([place])   . Chronic diastolic congestive heart failure ([place]) 06/23/2014  . Coma ([place]) 1968   "for 2 wks"  . Fall at home 10/25/2012  . Generalized weakness 06/23/2014  . NGEXBMWU(132.4)    "monthly" (06/10/2014)  . Lower urinary tract infectious disease 10/25/2012  . Malaria   . Obesity 10/25/2012  . Presence of suprapubic catheter ([place]) 06/17/2014   Due to prostate cancer   . Prostate cancer ([place])   . PTSD (post-traumatic stress disorder)    "I'm all over that now"  . Rhabdomyolysis 09/21/2017  . Sleep apnea 10/25/2012  . Staghorn calculus 06/30/2016  . Staphylococcus aureus bacteremia 06/30/2014  . Urinary incontinence, functional 10/25/2012  . UTI (urinary tract infection) 09/2017    Past Surgical History:  Procedure Laterality Date  . ANTERIOR CERVICAL DECOMP/DISCECTOMY FUSION  05/2007   Archie Endo 02/16/2011  . BACK SURGERY  2008   cervical and thoracic decompression for myelopathy  . PROSTATE SURGERY  03/2012   "shaved it down;  didn't take it off"  . SHRAPNEL REMOVAL  1968   "HEAD, CHEST, LEFT HAND"  . SUPRAPUBIC CATHETER INSERTION    . THORACIC LAMINECTOMY  05/2007   Archie Endo 02/16/2011    Allergies as of 09/27/2017   No Known Allergies     Medication List        Accurate as of  09/27/17  2:09 PM. Always use your most recent med list.          CALCIUM 1000 + D PO Take 1 tablet by mouth daily.   CALCIUM PO Take 1,000 mg by mouth daily.   CO Q 10 PO Take 600 mg by mouth daily.   furosemide 20 MG tablet Commonly known as:  LASIX Take 20 mg by mouth. Take one tablet daily for HTN   GLUCOSAMINE CHONDR COMPLEX PO Take 600 mg by mouth daily.   meloxicam 7.5 MG tablet Commonly known as:  MOBIC Take 7.5 mg by mouth daily.   MULTIVITAMIN PO Take 1 tablet by mouth daily.   polyethylene glycol packet Commonly known as:  MIRALAX Take 17 g by mouth 2 (two) times daily.   RESVERATROL PO Take 600 mg by mouth daily.   senna 8.6 MG Tabs tablet Commonly known as:  SENOKOT Take 2 tablets (17.2 mg total) by mouth at bedtime as needed for mild constipation or moderate constipation.   sulfamethoxazole-trimethoprim 800-160 MG tablet Commonly known as:  BACTRIM DS,SEPTRA DS Take 1 tablet by mouth 2 (two) times daily. Discontinue after 09/30/2017 doses.   traMADol 50 MG tablet Commonly known as:  ULTRAM Take 1 tablet (50 mg total) by mouth every 6 (six) hours as needed for moderate pain.   vitamin B-12 1000 MCG tablet Commonly known as:  CYANOCOBALAMIN Take 2 tablets (2,000 mcg total) by mouth daily.   Vitamin D3 10000 units capsule Take 10,000 Units by mouth daily.   vitamin E 1000 UNIT capsule Take 1,000 Units by mouth daily.       No orders of the defined types were placed in this encounter.   Immunization History  Administered Date(s) Administered  . Influenza Split 10/25/2012  . Influenza,inj,Quad PF,6+ Mos 06/30/2014  . Influenza-Unspecified 08/22/2017  . Pneumococcal Polysaccharide-23 10/25/2012  . Pneumococcal-Unspecified 08/22/2017    Social History   Tobacco Use  . Smoking status: Never Smoker  . Smokeless tobacco: Never Used  . Tobacco comment: "smoked cigarettes  for ~ 1 wk in [place]"  Substance Use Topics  . Alcohol use: No     Family history is   Family History  Problem Relation Age of Onset  . Heart disease Mother   . Dementia Mother   . Heart disease Father   . Dementia Father       Review of Systems  DATA OBTAINED: from patient, nurse GENERAL:  no fevers, fatigue, appetite changes; patient says he needs a longer bad, he is 6 feet 5 inches tall and does not fit in the bed SKIN: No itching, or rash EYES: No eye pain, redness, discharge EARS: No earache, tinnitus, change in hearing NOSE: No congestion, drainage or bleeding  MOUTH/THROAT: No mouth or tooth pain, No sore throat RESPIRATORY: No cough, wheezing, SOB CARDIAC: No chest pain, palpitations, lower extremity edema  GI: No abdominal pain, No N/V/D or constipation, No heartburn or reflux  GU: No dysuria, frequency or urgency, or incontinence  MUSCULOSKELETAL: No unrelieved bone/joint pain NEUROLOGIC: No headache, dizziness or focal weakness PSYCHIATRIC: No c/o anxiety or sadness  Vitals:   09/27/17 1358  BP: (!) 91/59  Pulse: 62  Resp: 20  Temp: 97.6 F (36.4 C)  SpO2: 98%    SpO2 Readings from Last 1 Encounters:  09/27/17 98%   Body mass index is 34.15 kg/m.     Physical Exam  GENERAL APPEARANCE: Alert, conversant,  No acute distress.  SKIN: No diaphoresis rash HEAD: Normocephalic, atraumatic  EYES: Conjunctiva/lids clear. Pupils round, reactive. EOMs intact.  EARS: External exam WNL, canals clear. Hearing grossly normal.  NOSE: No deformity or discharge.  MOUTH/THROAT: Lips w/o lesions  RESPIRATORY: Breathing is even, unlabored. Lung sounds are clear   CARDIOVASCULAR: Heart RRR no murmurs, rubs or gallops. No peripheral edema.   GASTROINTESTINAL: Abdomen is soft, non-tender, not distended w/ normal bowel sounds. GENITOURINARY: Bladder non tender, not distended  MUSCULOSKELETAL: Remarkable for no swelling or bruising of left knee NEUROLOGIC:  Cranial nerves 2-12 grossly intact. Moves all extremities  PSYCHIATRIC:  Mood and affect appropriate to situation, no behavioral issues  Patient Active Problem List   Diagnosis Date Noted  . Pressure injury of skin 09/22/2017  . Acute kidney injury ([place]) 09/21/2017  . UTI (urinary tract infection) 09/21/2017  . Elevated CK 09/21/2017  . Rhabdomyolysis 09/21/2017  . Staghorn calculus 06/30/2016  . Atonic neurogenic bladder 06/30/2016  . Urinary retention 08/15/2014  . Staphylococcus aureus bacteremia 06/30/2014  . Generalized weakness 06/23/2014  . Chronic diastolic congestive heart failure ([place]) 06/23/2014  . HTN (hypertension) 06/23/2014  . Prostate cancer ([place]) 06/23/2014  . Presence of suprapubic catheter ([place]) 06/17/2014  . Cervical myelopathy ([place])   . Arthritis   . Urinary incontinence, functional 10/25/2012  . Bilateral leg weakness 10/25/2012  . Fall at home 10/25/2012  . Lower urinary tract infectious disease 10/25/2012  . Sleep apnea 10/25/2012  . Medically noncompliant 10/25/2012  . Obesity 10/25/2012      Labs reviewed: Basic Metabolic Panel:    Component Value Date/Time   NA 134 (L) 09/22/2017 0434   K 3.7 09/22/2017 0434   CL 97 (L) 09/22/2017 0434   CO2 26 09/22/2017 0434   GLUCOSE 121 (H) 09/22/2017 0434   BUN 20 09/22/2017 0434   CREATININE 1.15 09/22/2017 0434   CALCIUM 8.7 (L) 09/22/2017 0434   PROT 7.4 09/21/2017 0743   ALBUMIN 2.7 (L) 09/21/2017 0743   AST 39 09/21/2017 0743   ALT 19 09/21/2017 0743   ALKPHOS 70 09/21/2017 0743   BILITOT 1.1 09/21/2017 0743   GFRNONAA >60 09/22/2017 0434   GFRAA >60 09/22/2017 0434    Recent Labs    09/17/17 1600 09/21/17 0629 09/22/17 0434  NA 137 134* 134*  K 3.6 3.8 3.7  CL 98* 94* 97*  CO2 30 27 26   GLUCOSE 121* 124* 121*  BUN 17 23* 20  CREATININE 1.13 1.35* 1.15  CALCIUM 9.0 9.0 8.7*   Liver Function Tests: Recent Labs    11/26/16 1344 09/21/17 0743  AST 26 39  ALT 25 19  ALKPHOS 46 70  BILITOT 1.1 1.1  PROT 7.5 7.4  ALBUMIN 3.6 2.7*   No results for  input(s): LIPASE, AMYLASE in the last 8760 hours. No results for input(s): AMMONIA in the last 8760 hours. CBC: Recent Labs    09/21/17 0743 09/22/17 0434 09/24/17 1441 09/25/17 0529  WBC  --  17.2* 9.8 11.2*  NEUTROABS 15.8*  --  6.9 7.9*  HGB  --  11.2* 11.5* 11.5*  HCT  --  34.8* 35.6* 35.8*  MCV  --  87.7 88.3 88.8  PLT  --  259 295 313   Lipid No results for input(s): CHOL, HDL, LDLCALC, TRIG in the last 8760 hours.  Cardiac Enzymes: Recent Labs    09/21/17 0743 09/22/17 0434  CKTOTAL 1,054* 687*   BNP: Recent Labs    09/17/17 1600  BNP 35.6   No results found for: [facility] Lab Results  Component Value Date   HGBA1C (H) 05/13/2007    6.3 (NOTE)   The ADA recommends the following therapeutic goals for glycemic   control related to Hgb A1C measurement:   Goal of Therapy:   < 7.0% Hgb A1C   Action Suggested:  > 8.0% Hgb A1C   Ref:  Diabetes Care, 22, Suppl. 1, 1999   Lab Results  Component Value Date   TSH 2.500 06/10/2014   No results found for: VITAMINB12 No results found for: FOLATE No results found for: IRON, TIBC, FERRITIN  Imaging and Procedures obtained prior to SNF admission: Dg Chest Port 1 View  Result Date: 09/21/2017 CLINICAL DATA:  Weakness. EXAM: PORTABLE CHEST 1 VIEW COMPARISON:  Radiographs of September 17, 2017. FINDINGS: The heart size and mediastinal contours are within normal limits. Both lungs are clear. Stable elevated right hemidiaphragm is noted. No pneumothorax or pleural effusion is noted. The visualized skeletal structures are unremarkable. IMPRESSION: No acute cardiopulmonary abnormality seen. Electronically Signed   By: Marijo Conception, M.D.   On: 09/21/2017 07:34   Dg Knee Complete 4 Views Left  Result Date: 09/22/2017 CLINICAL DATA:  LEFT knee pain EXAM: LEFT KNEE - COMPLETE 4+ VIEW COMPARISON:  None FINDINGS: Osseous demineralization. Medial compartment joint space narrowing. No acute fracture, dislocation, or bone destruction.  No knee joint effusion. IMPRESSION: Mild degenerative changes and osseous demineralization without acute bony abnormality. Electronically Signed   By: Lavonia Dana M.D.   On: 09/22/2017 14:49     Not all labs, radiology exams or other studies done during hospitalization come through on my EPIC note; however they are reviewed by me.    Assessment and Plan  PROVIDENCIA RETTGERI AND PROTEUS MIRABILIS UTI/CATHETER ASSOCIATED UTI-history of UTIs related to his indwelling suprapubic catheter. Patient was started empirically on IV Rocephin until the urine confirmed greater than 100,000 colonies of the above bacteria which time he was transitioned to oral BacTRIM SNF -admitted with generalized weakness for OT/PT; plan to continue Bactrim until 09/30/17; suprapubic catheter to be changed every monthly  AK I/RHABDOMYOLYSIS-baseline creatinine 1.13, presented with a creatinine of 1.35 and a CPK of 1054. AK I resolved with IV fluids and rhabdo improved to 687 prior to discharge SNF -encourage by mouth fluids; follow up BMP  FALL AT HOME/LEFT KNEE medial and lateral MENISCAL TEARS-patient is unable to flex his left knee; seen by orthopedics who recommended nonoperative treatment with knee immobilizer and weightbearing as tolerated SNF - continue knee immobilizer; OT/PT  HYPERTENSION SNF - controlled by Lasix 20 mg by mouth daily at home; plan to continue Lasix  Chronic diastolic congestive heart failure SNF - apparently not requiring a beta blocker; maintain on Lasix 20 mg by mouth daily  VITAMIN B-12 DEFICIENCY SNF - plan to continue replacement 2000 g daily   Time spent greater than 45 minutes;> 50% of time with patient was spent reviewing records, labs, tests and studies, counseling and developing plan of care  Webb Silversmith D. Sheppard Coil, MD

## 2017-09-27 NOTE — Consult Note (Signed)
   [facility] Inpatient Consult   09/27/2017  Kendarius Vigen 10/18/1945 [id number]    Reviewed chart for disposition status. It appears Mr. Schum discharged to [facility] on 09/26/17. Will notify Price LCSW.   Marthenia Rolling, MSN-Ed, RN,BSN [facility] Liaison [phone]

## 2017-09-28 ENCOUNTER — Other Ambulatory Visit: Payer: Self-pay | Admitting: *Deleted

## 2017-09-28 ENCOUNTER — Encounter: Payer: Self-pay | Admitting: *Deleted

## 2017-09-28 ENCOUNTER — Ambulatory Visit (INDEPENDENT_AMBULATORY_CARE_PROVIDER_SITE_OTHER): Payer: Medicare Other | Admitting: Orthopaedic Surgery

## 2017-09-28 ENCOUNTER — Other Ambulatory Visit (INDEPENDENT_AMBULATORY_CARE_PROVIDER_SITE_OTHER): Payer: Self-pay

## 2017-09-28 DIAGNOSIS — M4807 Spinal stenosis, lumbosacral region: Secondary | ICD-10-CM

## 2017-09-28 DIAGNOSIS — M25562 Pain in left knee: Secondary | ICD-10-CM | POA: Diagnosis not present

## 2017-09-28 DIAGNOSIS — M4804 Spinal stenosis, thoracic region: Secondary | ICD-10-CM

## 2017-09-28 DIAGNOSIS — G8911 Acute pain due to trauma: Secondary | ICD-10-CM

## 2017-09-28 NOTE — Patient Outreach (Signed)
[facility]) Care Management  09/28/2017  Jaxiel Kines 1946/06/16 [id number]   CSW was able to make initial contact with patient today to perform the assessment, as well as assess and assist with social work needs and services, when [place] met with patient at [facility] [place] where patient currently resides to receive short-term rehabilitative services.  CSW introduced self, explained role and types of services provided through [place] [facility] ([place] [facility]).  CSW further explained to patient that CSW works with patient's RNCM, also with [place] [facility], Raina Mina. CSW then explained the reason for the visit, indicating that Ms. Zigmund Daniel thought that patient would benefit from social work services and resources to assist with discharge planning needs and services from the skilled nursing facility.  CSW obtained two HIPAA compliant identifiers from patient, which included patient's name and date of birth. Patient admitted to experiencing a great deal of pain in his left knee and lower extremities today.  Patient reported a recent fall, in which he feels he sustained injury to the left knee.  CSW noted that patient is scheduled to see Dr. Frankey Shown, Orthopedic Specialist at [facility] later today to address his concerns.  Patient is currently unable to ambulate, due to pain and numbness in both legs.  At baseline, patient is able to ambulate with a walker.  Patient reported not being able to work with therapies, both physical and occupational, as a result of the pain and swelling. Patient's plan is to be able to return home to live alone at time of discharge from [facility].  Patient stated that he has no support system, and no one to assist him with activities of daily living, which he is unable to perform independently.  CSW inquired as to how patient planned to care for  himself, under his current circumstances and condition.  Patient is hopeful to be able to ambulate at time of discharge, reporting that he will not leave the facility until he is able to adequately care for himself.  CSW agreed to follow-up with patient again in two weeks to assess and assist with discharge planning needs and services. [facility] CM Care Plan Problem One     Most Recent Value  Care Plan Problem One  Level of Care Issues.  Role Documenting the Problem One  Clinical Social Worker  Care Plan for Problem One  Active  [facility] Long Term Goal   Patient will have home heallth services and durable medical equipment in place at time of discharge from the skilled nursing facility, within the next 45 days.  [facility] Long Term Goal Start Date  09/28/17  Interventions for Problem One Long Term Goal  CSW will assist patient and discharge planning coordinator with arranging home health services and durable medical equipment in place at time of discharge.  [facility] CM Short Term Goal #1   Patient will decide on a home health agency of choice with regards to home care services, within the next three weeks.  [facility] CM Short Term Goal #1 Start Date  09/28/17  Interventions for Short Term Goal #1  CSW has provided patient with a list of home health agencies offering home care services.  THN CM Short Term Goal #2   Patient will decide on a home health agency of choice, with regards to durable medical equipment, within the next three weeks.  [facility] CM Short Term Goal #2 Start Date  09/28/17  Interventions for Short Term Goal #2  CSW has provided patient with a list of agencies offering durable medical equipment.     Nat Christen, BSW, MSW, LCSW  Licensed Education officer, [facility]  Mailing [place] [address], [place], [place] [address] Physical Address-[address], [place], [place] [address] Toll Free Main # [phone] Fax # [phone] Cell # [phone]   Office # [phone] [email or web address]

## 2017-09-28 NOTE — Progress Notes (Signed)
Office Visit Note   Patient: Jerry [name]           Date of Birth: Aug 11, 1946           MRN: [id number] Visit Date: 09/28/2017              Requested by: Aura Dials, [address], [place] [id number] PCP: Aura Dials, MD   Assessment & Plan: Visit Diagnoses:  1. Acute pain of left knee     Plan: Impression is bilateral lower extremity paresis.  I reviewed the MRI of the x-rays which do not explain why he has flaccid paralysis of the lower extremities.  I think this is a central problem.  Urgent MRI of the entire spine was ordered.  We will be in touch with the patient regarding the results. Total face to face encounter time was greater than 45 minutes and over half of this time was spent in counseling and/or coordination of care.  Follow-Up Instructions: Return if symptoms worsen or fail to improve.   Orders:  No orders of the defined types were placed in this encounter.  No orders of the defined types were placed in this encounter.     Procedures: No procedures performed   Clinical Data: No additional findings.   Subjective: No chief complaint on file.   Patient is a 71 year old gentleman who follows up from school for left knee pain.  He states that he had a fall about a week ago and was unable to ambulate as a result.  He was admitted to the hospital MRI was relatively unremarkable.  He follows up today with a knee immobilizer.  He walks with a walker at baseline.  He does have a supra pubic catheter.  He also has a history of back problems.  He states that he has problems with his knee buckling.  He is unable to extend his knee or flex his hip.  Denies any numbness or tingling.    Review of Systems  Constitutional: Negative.   All other systems reviewed and are negative.    Objective: Vital Signs: There were no vitals taken for this visit.  Physical Exam  Constitutional: He is oriented to person, place, and time. He appears well-developed  and well-nourished.  HENT:  Head: Normocephalic and atraumatic.  Eyes: Pupils are equal, round, and reactive to light.  Neck: Neck supple.  Pulmonary/Chest: Effort normal.  Abdominal: Soft.  Musculoskeletal: Normal range of motion.  Neurological: He is alert and oriented to person, place, and time.  Skin: Skin is warm.  Psychiatric: He has a normal mood and affect. His behavior is normal. Judgment and thought content normal.  Nursing note and vitals reviewed.   Ortho Exam Bilateral lower extremity exam shows  severe weakness of bilateral lower extremity knee  extension and hip flexion.  He is able to flex his knee against gravity.  He has a small left knee joint effusion. Specialty Comments:  No specialty comments available.  Imaging: No results found.   PMFS History: Patient Active Problem List   Diagnosis Date Noted  . Pressure injury of skin 09/22/2017  . Acute kidney injury ([place]) 09/21/2017  . UTI (urinary tract infection) 09/21/2017  . Elevated CK 09/21/2017  . Rhabdomyolysis 09/21/2017  . Staghorn calculus 06/30/2016  . Atonic neurogenic bladder 06/30/2016  . Urinary retention 08/15/2014  . Staphylococcus aureus bacteremia 06/30/2014  . Generalized weakness 06/23/2014  . Chronic diastolic congestive heart failure ([place]) 06/23/2014  .  HTN (hypertension) 06/23/2014  . Prostate cancer ([place]) 06/23/2014  . Presence of suprapubic catheter ([place]) 06/17/2014  . Cervical myelopathy ([place])   . Arthritis   . Urinary incontinence, functional 10/25/2012  . Bilateral leg weakness 10/25/2012  . Fall at home 10/25/2012  . Lower urinary tract infectious disease 10/25/2012  . Sleep apnea 10/25/2012  . Medically noncompliant 10/25/2012  . Obesity 10/25/2012   Past Medical History:  Diagnosis Date  . Acute kidney injury ([place]) 09/21/2017  . Arthritis    "hands, knees, back, legs" (06/10/2014)  . Atonic neurogenic bladder 06/30/2016  . Bilateral leg weakness 10/25/2012  . Cervical  myelopathy ([place])   . Chronic diastolic congestive heart failure ([place]) 06/23/2014  . Coma ([place]) 1968   "for 2 wks"  . Fall at home 10/25/2012  . Generalized weakness 06/23/2014  . VELFYBOF(751.0)    "monthly" (06/10/2014)  . Lower urinary tract infectious disease 10/25/2012  . Malaria   . Obesity 10/25/2012  . Presence of suprapubic catheter ([place]) 06/17/2014   Due to prostate cancer   . Prostate cancer ([place])   . PTSD (post-traumatic stress disorder)    "I'm all over that now"  . Rhabdomyolysis 09/21/2017  . Sleep apnea 10/25/2012  . Staghorn calculus 06/30/2016  . Staphylococcus aureus bacteremia 06/30/2014  . Urinary incontinence, functional 10/25/2012  . UTI (urinary tract infection) 09/2017    Family History  Problem Relation Age of Onset  . Heart disease Mother   . Dementia Mother   . Heart disease Father   . Dementia Father     Past Surgical History:  Procedure Laterality Date  . ANTERIOR CERVICAL DECOMP/DISCECTOMY FUSION  05/2007   Archie Endo 02/16/2011  . BACK SURGERY  2008   cervical and thoracic decompression for myelopathy  . PROSTATE SURGERY  03/2012   "shaved it down; didn't take it off"  . SHRAPNEL REMOVAL  1968   "HEAD, CHEST, LEFT HAND"  . SUPRAPUBIC CATHETER INSERTION    . THORACIC LAMINECTOMY  05/2007   Archie Endo 02/16/2011   Social History   Occupational History  . Occupation: retired  Tobacco Use  . Smoking status: Never Smoker  . Smokeless tobacco: Never Used  . Tobacco comment: "smoked cigarettes  for ~ 1 wk in [place]"  Substance and Sexual Activity  . Alcohol use: No  . Drug use: No  . Sexual activity: Not Currently

## 2017-09-29 ENCOUNTER — Encounter: Payer: Self-pay | Admitting: Internal Medicine

## 2017-09-29 DIAGNOSIS — S83242A Other tear of medial meniscus, current injury, left knee, initial encounter: Secondary | ICD-10-CM | POA: Insufficient documentation

## 2017-09-29 DIAGNOSIS — N390 Urinary tract infection, site not specified: Principal | ICD-10-CM

## 2017-09-29 DIAGNOSIS — B964 Proteus (mirabilis) (morganii) as the cause of diseases classified elsewhere: Secondary | ICD-10-CM | POA: Insufficient documentation

## 2017-09-29 DIAGNOSIS — E538 Deficiency of other specified B group vitamins: Secondary | ICD-10-CM | POA: Insufficient documentation

## 2017-09-29 DIAGNOSIS — S83282A Other tear of lateral meniscus, current injury, left knee, initial encounter: Secondary | ICD-10-CM | POA: Insufficient documentation

## 2017-09-29 DIAGNOSIS — W19XXXA Unspecified fall, initial encounter: Secondary | ICD-10-CM | POA: Insufficient documentation

## 2017-10-01 DIAGNOSIS — L89153 Pressure ulcer of sacral region, stage 3: Secondary | ICD-10-CM | POA: Diagnosis not present

## 2017-10-05 ENCOUNTER — Ambulatory Visit: Payer: Self-pay | Admitting: *Deleted

## 2017-10-10 ENCOUNTER — Encounter (INDEPENDENT_AMBULATORY_CARE_PROVIDER_SITE_OTHER): Payer: Self-pay | Admitting: *Deleted

## 2017-10-12 ENCOUNTER — Other Ambulatory Visit: Payer: Self-pay | Admitting: *Deleted

## 2017-10-12 NOTE — Patient Outreach (Addendum)
[facility]) Care Management  10/12/2017  Jerry [name] [birth year] [id number]   CSW was able to meet with patient today at [facility] today, [place] where patient currently resides to receive short-term rehabilitative services, to perform a routine visit.  Patient admits not being able to work with therapies (both physical and occupational) as much as he'd like, due to left knee pain.  Patient reported that he has good days and bad days, today not being a good day.  Patient further reported that he is somewhat afraid to put pressure on his left leg, for free that he will fall.  CSW encouraged patient to work with therapies as much as possible, not wanting patient's insurance to deny coverage.  Patient was also encouraged to use his walker when trying to stand without assistance.  Patient would greatly benefit from long-term care services at a skilled nursing facility but is adamant about returning home to live independently.  CSW agreed to follow-up with patient again in two weeks, as patient does not have a tentative discharge date scheduled as of yet. [facility] CM Care Plan Problem One     Most Recent Value  Care Plan Problem One  Level of Care Issues.  Role Documenting the Problem One  Clinical Social Worker  Care Plan for Problem One  Active  [facility] Long Term Goal   Patient will have home heallth services and durable medical equipment in place at time of discharge from the skilled nursing facility, within the next 45 days.  [facility]N Long Term Goal Start Date  09/28/17  Interventions for Problem One Long Term Goal  CSW will assist patient and discharge planning coordinator with arranging home health services and durable medical equipment in place at time of discharge.  [facility]N CM Short Term Goal #1   Patient will decide on a home health agency of choice with regards to home care services, within the next three weeks.  [facility] CM Short Term Goal #1  Start Date  09/28/17  Interventions for Short Term Goal #1  CSW has provided patient with a list of home health agencies offering home care services.  [facility]N CM Short Term Goal #2   Patient will decide on a home health agency of choice, with regards to durable medical equipment, within the next three weeks.  [facility] CM Short Term Goal #2 Start Date  09/28/17  [facility] CM Short Term Goal #2 Met Date  10/12/17  Interventions for Short Term Goal #2  CSW has provided patient with a list of agencies offering durable medical equipment.     Nat Christen, BSW, MSW, LCSW  Licensed Education officer, environmental Health System  Mailing [place] [address] [place], [place] [address] Physical Address-[address], [place], [place] [address] Toll Free Main # [phone] Fax # [phone] Cell # [phone]  Office # [phone] [email or web address]

## 2017-10-22 ENCOUNTER — Encounter: Payer: Self-pay | Admitting: Internal Medicine

## 2017-10-22 ENCOUNTER — Non-Acute Institutional Stay (SKILLED_NURSING_FACILITY): Payer: Medicare Other | Admitting: Internal Medicine

## 2017-10-22 DIAGNOSIS — R5081 Fever presenting with conditions classified elsewhere: Secondary | ICD-10-CM | POA: Diagnosis not present

## 2017-10-22 DIAGNOSIS — R399 Unspecified symptoms and signs involving the genitourinary system: Secondary | ICD-10-CM | POA: Diagnosis not present

## 2017-10-22 NOTE — Progress Notes (Signed)
Location:  [facility] Room Number: 130Q Place of Service:  SNF (31)  Jerry [name]. Sheppard Coil, MD  Patient Care Team: Aura Dials, MD as PCP - General (Family Medicine) Tobi Bastos, RN as [place] Management [place], Maree Erie, LCSW as [place] Management Ulyses Southward., MD as Consulting Physician (Urology) Leandrew Koyanagi, MD as Consulting Physician (Orthopedic Surgery)  Extended Emergency Contact Information Primary Emergency Contact: Wylie Hail [address] [place] Phone: [phone] Relation: Friend Secondary Emergency Contact: Orbie Pyo, [place] Phone: [phone] Mobile Phone: [phone] Relation: Friend    Allergies: Patient has no known allergies.  Chief Complaint  Patient presents with  . Acute Visit    Recheck decreased appetitie, urine sedimentation, and trouble breathing    HPI: Patient is 72 y.o. male who is being seen today for possible UTI. Patient has a suprapubic catheter and has noted more sediment in it recently. He's also had a fever 101.9 and decreased appetite. These are all symptoms as he has associated with a UTI in the past. Patient denies nausea vomiting   Past Medical History:  Diagnosis Date  . Acute kidney injury ([place]) 09/21/2017  . Arthritis    "hands, knees, back, legs" (06/10/2014)  . Atonic neurogenic bladder 06/30/2016  . Bilateral leg weakness 10/25/2012  . Cervical myelopathy ([place])   . Chronic diastolic congestive heart failure ([place]) 06/23/2014  . Coma ([place]) 1968   "for 2 wks"  . Fall at home 10/25/2012  . Generalized weakness 06/23/2014  . QQVZDGLO(756.4)    "monthly" (06/10/2014)  . Lower urinary tract infectious disease 10/25/2012  . Malaria   . Obesity 10/25/2012  . Presence of suprapubic catheter ([place]) 06/17/2014   Due to prostate cancer   . Prostate cancer ([place])   .  PTSD (post-traumatic stress disorder)    "I'm all over that now"  . Rhabdomyolysis 09/21/2017  . Sleep apnea 10/25/2012  . Staghorn calculus 06/30/2016  . Staphylococcus aureus bacteremia 06/30/2014  . Urinary incontinence, functional 10/25/2012  . UTI (urinary tract infection) 09/2017    Past Surgical History:  Procedure Laterality Date  . ANTERIOR CERVICAL DECOMP/DISCECTOMY FUSION  05/2007   Archie Endo 02/16/2011  . BACK SURGERY  2008   cervical and thoracic decompression for myelopathy  . PROSTATE SURGERY  03/2012   "shaved it down; didn't take it off"  . SHRAPNEL REMOVAL  1968   "HEAD, CHEST, LEFT HAND"  . SUPRAPUBIC CATHETER INSERTION    . THORACIC LAMINECTOMY  05/2007   Archie Endo 02/16/2011    Allergies as of 10/22/2017   No Known Allergies     Medication List        Accurate as of 10/22/17  4:12 PM. Always use your most recent med list.          CALCIUM 1000 + D PO Take 1 tablet by mouth daily.   CALCIUM PO Take 1,000 mg by mouth daily.   CO Q 10 PO Take 600 mg by mouth daily.   furosemide 20 MG tablet Commonly known as:  LASIX Take 20 mg by mouth. Take one tablet daily for HTN   GLUCOSAMINE CHONDR COMPLEX PO Take 600 mg by mouth daily.   meloxicam 7.5 MG tablet Commonly known as:  MOBIC Take 7.5 mg by mouth daily.   MULTIVITAMIN PO  Take 1 tablet by mouth daily.   polyethylene glycol packet Commonly known as:  MIRALAX Take 17 g by mouth 2 (two) times daily.   RESVERATROL PO Take 600 mg by mouth daily.   senna 8.6 MG Tabs tablet Commonly known as:  SENOKOT Take 2 tablets (17.2 mg total) by mouth at bedtime as needed for mild constipation or moderate constipation.   sulfamethoxazole-trimethoprim 800-160 MG tablet Commonly known as:  BACTRIM DS,SEPTRA DS Take 1 tablet by mouth 2 (two) times daily. Discontinue after 09/30/2017 doses.   traMADol 50 MG tablet Commonly known as:  ULTRAM Take 1 tablet (50 mg total) by mouth every 6 (six) hours as needed for  moderate pain.   vitamin B-12 1000 MCG tablet Commonly known as:  CYANOCOBALAMIN Take 2 tablets (2,000 mcg total) by mouth daily.   Vitamin D3 10000 units capsule Take 10,000 Units by mouth daily.   vitamin E 1000 UNIT capsule Take 1,000 Units by mouth daily.       No orders of the defined types were placed in this encounter.   Immunization History  Administered Date(s) Administered  . Influenza Split 10/25/2012  . Influenza,inj,Quad PF,6+ Mos 06/30/2014  . Influenza-Unspecified 08/22/2017  . Pneumococcal Polysaccharide-23 10/25/2012  . Pneumococcal-Unspecified 08/22/2017    Social History   Tobacco Use  . Smoking status: Never Smoker  . Smokeless tobacco: Never Used  . Tobacco comment: "smoked cigarettes  for ~ 1 wk in [place]"  Substance Use Topics  . Alcohol use: No    Review of Systems  DATA OBTAINED: from patient, nurse GENERAL:  + fevers, fatigue, +appetite changes SKIN: No itching, rash HEENT: No complaint RESPIRATORY: No cough, wheezing, SOB CARDIAC: No chest pain, palpitations, lower extremity edema  GI: No abdominal pain, No N/V/D or constipation, No heartburn or reflux  GU: No dysuria, frequency or urgency, or incontinence ; suprapubic catheter with increased sediment, nurse's 7 noted increased odor MUSCULOSKELETAL: No unrelieved bone/joint pain NEUROLOGIC: No headache, dizziness  PSYCHIATRIC: No overt anxiety or sadness  Vitals:   10/22/17 1609  BP: 114/66  Pulse: 71  Resp: 20  Temp: (!) 97.4 F (36.3 C)  SpO2: 97%   Body mass index is 33.37 kg/m. Physical Exam  GENERAL APPEARANCE: Alert, conversant, No acute distress  SKIN: No diaphoresis rash HEENT: Unremarkable RESPIRATORY: Breathing is even, unlabored. Lung sounds are clear   CARDIOVASCULAR: Heart RRR no murmurs, rubs or gallops. No peripheral edema  GASTROINTESTINAL: Abdomen is soft, non-tender, not distended w/ normal bowel sounds.  GENITOURINARY: Bladder non tender, not  distended ; suprapubic catheter with sediment MUSCULOSKELETAL: No abnormal joints or musculature NEUROLOGIC: Cranial nerves 2-12 grossly intact. Moves all extremities PSYCHIATRIC: Mood and affect appropriate to situation, no behavioral issues  Patient Active Problem List   Diagnosis Date Noted  . Urinary tract infection due to Proteus 09/29/2017  . Fall 09/29/2017  . Acute lateral meniscus tear of left knee 09/29/2017  . Acute medial meniscus tear of left knee 09/29/2017  . Vitamin B12 deficiency 09/29/2017  . Pressure injury of skin 09/22/2017  . Acute kidney injury ([place]) 09/21/2017  . Catheter-associated urinary tract infection ([place]) 09/21/2017  . Elevated CK 09/21/2017  . Rhabdomyolysis 09/21/2017  . Staghorn calculus 06/30/2016  . Atonic neurogenic bladder 06/30/2016  . Urinary retention 08/15/2014  . Staphylococcus aureus bacteremia 06/30/2014  . Generalized weakness 06/23/2014  . Chronic diastolic congestive heart failure ([place]) 06/23/2014  . HTN (hypertension) 06/23/2014  . Prostate cancer ([place]) 06/23/2014  . Presence  of suprapubic catheter ([place]) 06/17/2014  . Cervical myelopathy ([place])   . Arthritis   . Urinary incontinence, functional 10/25/2012  . Bilateral leg weakness 10/25/2012  . Fall at home 10/25/2012  . Lower urinary tract infectious disease 10/25/2012  . Sleep apnea 10/25/2012  . Medically noncompliant 10/25/2012  . Obesity 10/25/2012    CMP     Component Value Date/Time   NA 134 (L) 09/22/2017 0434   K 3.7 09/22/2017 0434   CL 97 (L) 09/22/2017 0434   CO2 26 09/22/2017 0434   GLUCOSE 121 (H) 09/22/2017 0434   BUN 20 09/22/2017 0434   CREATININE 1.15 09/22/2017 0434   CALCIUM 8.7 (L) 09/22/2017 0434   PROT 7.4 09/21/2017 0743   ALBUMIN 2.7 (L) 09/21/2017 0743   AST 39 09/21/2017 0743   ALT 19 09/21/2017 0743   ALKPHOS 70 09/21/2017 0743   BILITOT 1.1 09/21/2017 0743   GFRNONAA >60 09/22/2017 0434   GFRAA >60 09/22/2017 0434   Recent Labs     09/17/17 1600 09/21/17 0629 09/22/17 0434  NA 137 134* 134*  K 3.6 3.8 3.7  CL 98* 94* 97*  CO2 30 27 26   GLUCOSE 121* 124* 121*  BUN 17 23* 20  CREATININE 1.13 1.35* 1.15  CALCIUM 9.0 9.0 8.7*   Recent Labs    11/26/16 1344 09/21/17 0743  AST 26 39  ALT 25 19  ALKPHOS 46 70  BILITOT 1.1 1.1  PROT 7.5 7.4  ALBUMIN 3.6 2.7*   Recent Labs    09/21/17 0743 09/22/17 0434 09/24/17 1441 09/25/17 0529  WBC  --  17.2* 9.8 11.2*  NEUTROABS 15.8*  --  6.9 7.9*  HGB  --  11.2* 11.5* 11.5*  HCT  --  34.8* 35.6* 35.8*  MCV  --  87.7 88.3 88.8  PLT  --  259 295 313   No results for input(s): CHOL, LDLCALC, TRIG in the last 8760 hours.  Invalid input(s): HCL No results found for: MICROALBUR Lab Results  Component Value Date   TSH 2.500 06/10/2014   Lab Results  Component Value Date   HGBA1C (H) 05/13/2007    6.3 (NOTE)   The ADA recommends the following therapeutic goals for glycemic   control related to Hgb A1C measurement:   Goal of Therapy:   < 7.0% Hgb A1C   Action Suggested:  > 8.0% Hgb A1C   Ref:  Diabetes Care, 22, Suppl. 1, 1999   No results found for: CHOL, HDL, LDLCALC, LDLDIRECT, TRIG, CHOLHDL  Significant Diagnostic Results in last 30 days:  Mr Knee Left Wo Contrast  Result Date: 09/23/2017 CLINICAL DATA:  Right knee pain. History of fall 09/16/2017. The patient is unable to flex his right knee. EXAM: MRI OF THE LEFT KNEE WITHOUT CONTRAST TECHNIQUE: Multiplanar, multisequence MR imaging of the knee was performed. No intravenous contrast was administered. COMPARISON:  Plain films right knee 09/22/2017. FINDINGS: MENISCI Medial meniscus: Horizontal tear in the posterior horn reaches the meniscal undersurface. Lateral meniscus: Tearing in the posterior horn includes a horizontal component reaching the meniscal undersurface and a radial component along the central aspect of the free edge. Horizontal tear extends throughout the body of the meniscus with marked fraying  along the free edge. LIGAMENTS Cruciates:  Intact. Collaterals:  Intact. CARTILAGE Patellofemoral:  Preserved. Medial:  Preserved. Lateral:  Preserved. Joint:  Small to moderate joint effusion. Popliteal Fossa:  No Baker's cyst. Extensor Mechanism:  Intact. Bones:  No fracture, contusion or worrisome lesion. Other:  There is marked fatty atrophy of the gastrocnemius musculature bilaterally. A milder degree of fatty atrophy of the semimembranosus and biceps femoris muscles is also identified. IMPRESSION: No finding to explain the patient's inability to flex his knee. Tears of the medial and lateral menisci as described above. Fatty atrophy of musculature about the knee as described above is likely related to disuse. Electronically Signed   By: Inge Rise M.D.   On: 09/23/2017 12:24    Assessment and Plan  Fever/urinary symptoms-probable UTI, however will not treat until antibiotic sensitivities return unless patient starts to look ill    Jerry [name]. Sheppard Coil, MD

## 2017-10-26 ENCOUNTER — Encounter: Payer: Self-pay | Admitting: Internal Medicine

## 2017-10-26 ENCOUNTER — Other Ambulatory Visit: Payer: Self-pay | Admitting: *Deleted

## 2017-10-26 NOTE — Patient Outreach (Addendum)
[facility]) Care Management  10/26/2017  Tonya Carlile 1946-08-28 [id number]  CSW was able to meet with patient today at [facility], [place] where patient currently resides to receive short-term rehabilitative services, to perform a routine visit.  Patient appeared to be somewhat confused today regarding his current plan of care.  Patient reports that he still does not have a tentative discharge date scheduled.  CSW spoke with patient about all the benefits of staying at [facility] for long-term care services, but patient is adamant about returning home to live at time of discharge.   Patient was completely wheelchair bound prior to going to [facility] but is now able to at least stand for brief periods of time.  The goal is for patient to be able to walk several feet before he is discharged.  Patient is agreeable to home health services and durable medical equipment being arranged.  CSW will make contact with patient again on Friday, November 09, 2017 to assess progress with therapies, as well as assess and assist with discharge planning needs and services. [facility] CM Care Plan Problem One     Most Recent Value  Care Plan Problem One  Level of Care Issues.  Role Documenting the Problem One  Clinical Social Worker  Care Plan for Problem One  Active  [facility] Long Term Goal   Patient will have home heallth services and durable medical equipment in place at time of discharge from the skilled nursing facility, within the next 45 days.  [facility]N Long Term Goal Start Date  09/28/17  Interventions for Problem One Long Term Goal  CSW will assist patient and discharge planning coordinator with arranging home health services and durable medical equipment in place at time of discharge.  [facility] CM Short Term Goal #1   Patient will decide on a home health agency of choice with regards to home care services, within the next three weeks.  [facility]N CM Short Term Goal  #1 Start Date  09/28/17  [facility] CM Short Term Goal #1 Met Date  10/26/17  Interventions for Short Term Goal #1  CSW has provided patient with a list of home health agencies offering home care services.  THN CM Short Term Goal #2   Patient will decide on a home health agency of choice, with regards to durable medical equipment, within the next three weeks.  [facility]N CM Short Term Goal #2 Start Date  09/28/17  [facility] CM Short Term Goal #2 Met Date  10/12/17  Interventions for Short Term Goal #2  CSW has provided patient with a list of agencies offering durable medical equipment.     Nat Christen, BSW, MSW, LCSW  Licensed Education officer, environmental Health System  Mailing [place] [address] [place], [place] [address] Physical Address-[address], [place], [place] [address] Toll Free Main # [phone] Fax # [phone] Cell # [phone]  Office # [phone] [email or web address]

## 2017-10-31 ENCOUNTER — Non-Acute Institutional Stay (SKILLED_NURSING_FACILITY): Payer: Medicare Other | Admitting: Internal Medicine

## 2017-10-31 ENCOUNTER — Encounter: Payer: Self-pay | Admitting: Internal Medicine

## 2017-10-31 DIAGNOSIS — B964 Proteus (mirabilis) (morganii) as the cause of diseases classified elsewhere: Secondary | ICD-10-CM

## 2017-10-31 DIAGNOSIS — N390 Urinary tract infection, site not specified: Secondary | ICD-10-CM

## 2017-11-05 DIAGNOSIS — L89622 Pressure ulcer of left heel, stage 2: Secondary | ICD-10-CM | POA: Diagnosis not present

## 2017-11-05 LAB — CBC AND DIFFERENTIAL
HEMATOCRIT: 36 — AB (ref 41–53)
Hemoglobin: 11.8 — AB (ref 13.5–17.5)
PLATELETS: 377 (ref 150–399)
WBC: 10.7

## 2017-11-05 LAB — BASIC METABOLIC PANEL
BUN: 16 (ref 4–21)
Creatinine: 1.3 (ref 0.6–1.3)
Glucose: 184
Potassium: 3.9 (ref 3.4–5.3)
Sodium: 138 (ref 137–147)

## 2017-11-09 ENCOUNTER — Encounter: Payer: Self-pay | Admitting: Internal Medicine

## 2017-11-09 ENCOUNTER — Non-Acute Institutional Stay (SKILLED_NURSING_FACILITY): Payer: Medicare Other | Admitting: Internal Medicine

## 2017-11-09 ENCOUNTER — Other Ambulatory Visit: Payer: Self-pay | Admitting: *Deleted

## 2017-11-09 DIAGNOSIS — B964 Proteus (mirabilis) (morganii) as the cause of diseases classified elsewhere: Secondary | ICD-10-CM | POA: Diagnosis not present

## 2017-11-09 DIAGNOSIS — N390 Urinary tract infection, site not specified: Secondary | ICD-10-CM

## 2017-11-09 DIAGNOSIS — T83510A Infection and inflammatory reaction due to cystostomy catheter, initial encounter: Secondary | ICD-10-CM | POA: Diagnosis not present

## 2017-11-09 NOTE — Progress Notes (Signed)
Provider: Noah Delaine. Sheppard Coil, MD  Location:  [facility] Room Number: 108-P Place of Service:  SNF [phone])  Aura Dials, MD  Patient Care Team: Aura Dials, MD as PCP - General (Family Medicine) Tobi Bastos, RN as [place] Management [place], Maree Erie, LCSW as [place] Management Ulyses Southward., MD as Consulting Physician (Urology) Leandrew Koyanagi, MD as Consulting Physician (Orthopedic Surgery)  Extended Emergency Contact Information Primary Emergency Contact: Wylie Hail [address] [place] Phone: [phone] Relation: Friend Secondary Emergency Contact: Orbie Pyo, Willey Blade of [place] Phone: [phone] Mobile Phone: [phone] Relation: Friend    Allergies: Patient has no known allergies.  Chief Complaint  Patient presents with  . Acute Visit    UTI    HPI: Patient is 72 y.o. male who nursing asked me to see for an acute UTI. Patient's urine has grown out greater than 100,000 Proteus and the 100 greater than 100,000 Providencia rettgeri. Patient has had no fever, chills, nausea, vomiting or any other systemic symptom.  Past Medical History:  Diagnosis Date  . Acute kidney injury ([place]) 09/21/2017  . Arthritis    "hands, knees, back, legs" (06/10/2014)  . Atonic neurogenic bladder 06/30/2016  . Bilateral leg weakness 10/25/2012  . Cervical myelopathy ([place])   . Chronic diastolic congestive heart failure ([place]) 06/23/2014  . Coma ([place]) 1968   "for 2 wks"  . Fall at home 10/25/2012  . Generalized weakness 06/23/2014  . ZJIRCVEL(381.0)    "monthly" (06/10/2014)  . Lower urinary tract infectious disease 10/25/2012  . Malaria   . Obesity 10/25/2012  . Presence of suprapubic catheter ([place]) 06/17/2014   Due to prostate cancer   . Prostate cancer ([place])   . PTSD (post-traumatic stress disorder)    "I'm all over that  now"  . Rhabdomyolysis 09/21/2017  . Sleep apnea 10/25/2012  . Staghorn calculus 06/30/2016  . Staphylococcus aureus bacteremia 06/30/2014  . Urinary incontinence, functional 10/25/2012  . UTI (urinary tract infection) 09/2017    Past Surgical History:  Procedure Laterality Date  . ANTERIOR CERVICAL DECOMP/DISCECTOMY FUSION  05/2007   Archie Endo 02/16/2011  . BACK SURGERY  2008   cervical and thoracic decompression for myelopathy  . PROSTATE SURGERY  03/2012   "shaved it down; didn't take it off"  . SHRAPNEL REMOVAL  1968   "HEAD, CHEST, LEFT HAND"  . SUPRAPUBIC CATHETER INSERTION    . THORACIC LAMINECTOMY  05/2007   Archie Endo 02/16/2011    Allergies as of 11/09/2017   No Known Allergies     Medication List        Accurate as of 11/09/17  2:52 PM. Always use your most recent med list.          CALCIUM 1000 + D PO Take 1 tablet by mouth daily.   CALCIUM PO Take 1,000 mg by mouth daily.   cefTRIAXone 1 g injection Commonly known as:  ROCEPHIN Inject 1 g into the muscle once. X 7 days for UTI   CO Q 10 PO Take 600 mg by mouth daily.   furosemide 20 MG tablet Commonly known as:  LASIX Take 20 mg by mouth. Take one tablet daily for HTN   GLUCOSAMINE CHONDR COMPLEX PO Take 600 mg by mouth daily.   meloxicam 7.5 MG tablet  Commonly known as:  MOBIC Take 7.5 mg by mouth daily.   MULTIVITAMIN PO Take 1 tablet by mouth daily.   polyethylene glycol packet Commonly known as:  MIRALAX Take 17 g by mouth 2 (two) times daily.   RESVERATROL PO Take 600 mg by mouth daily.   senna 8.6 MG Tabs tablet Commonly known as:  SENOKOT Take 2 tablets (17.2 mg total) by mouth at bedtime as needed for mild constipation or moderate constipation.   vitamin B-12 1000 MCG tablet Commonly known as:  CYANOCOBALAMIN Take 2 tablets (2,000 mcg total) by mouth daily.   Vitamin D3 10000 units capsule Take 10,000 Units by mouth daily.   vitamin E 1000 UNIT capsule Take 1,000 Units by mouth  daily.       No orders of the defined types were placed in this encounter.   Immunization History  Administered Date(s) Administered  . Influenza Split 10/25/2012  . Influenza,inj,Quad PF,6+ Mos 06/30/2014  . Influenza-Unspecified 08/22/2017  . Pneumococcal Polysaccharide-23 10/25/2012  . Pneumococcal-Unspecified 08/22/2017    Social History   Tobacco Use  . Smoking status: Never Smoker  . Smokeless tobacco: Never Used  . Tobacco comment: "smoked cigarettes  for ~ 1 wk in [place]"  Substance Use Topics  . Alcohol use: No    Review of Systems  DATA OBTAINED: from patient, nurse GENERAL:  no fevers, fatigue, appetite changes SKIN: No itching, rash HEENT: No complaint RESPIRATORY: No cough, wheezing, SOB CARDIAC: No chest pain, palpitations, lower extremity edema  GI: No abdominal pain, No N/V/D or constipation, No heartburn or reflux  GU: No dysuria, frequency or urgency, or incontinence; Foley cath-urine with odor  MUSCULOSKELETAL: No unrelieved bone/joint pain NEUROLOGIC: No headache, dizziness  PSYCHIATRIC: No overt anxiety or sadness  Vitals:   11/09/17 1442  BP: 106/60  Pulse: 69  Resp: 16  Temp: (!) 96.9 F (36.1 C)   Body mass index is 39.6 kg/m. Physical Exam  GENERAL APPEARANCE: Alert, conversant, No acute distress  SKIN: No diaphoresis rash HEENT: Unremarkable RESPIRATORY: Breathing is even, unlabored. Lung sounds are clear   CARDIOVASCULAR: Heart RRR no murmurs, rubs or gallops. No peripheral edema  GASTROINTESTINAL: Abdomen is soft, non-tender, not distended w/ normal bowel sounds.  GENITOURINARY: Bladder non tender, not distended  MUSCULOSKELETAL: No abnormal joints or musculature NEUROLOGIC: Cranial nerves 2-12 grossly intact. Moves all extremities PSYCHIATRIC: Mood and affect appropriate to situation, no behavioral issues  Patient Active Problem List   Diagnosis Date Noted  . Urinary tract infection due to Proteus 09/29/2017  . Fall  09/29/2017  . Acute lateral meniscus tear of left knee 09/29/2017  . Acute medial meniscus tear of left knee 09/29/2017  . Vitamin B12 deficiency 09/29/2017  . Pressure injury of skin 09/22/2017  . Acute kidney injury ([place]) 09/21/2017  . Catheter-associated urinary tract infection ([place]) 09/21/2017  . Elevated CK 09/21/2017  . Rhabdomyolysis 09/21/2017  . Staghorn calculus 06/30/2016  . Atonic neurogenic bladder 06/30/2016  . Urinary retention 08/15/2014  . Staphylococcus aureus bacteremia 06/30/2014  . Generalized weakness 06/23/2014  . Chronic diastolic congestive heart failure ([place]) 06/23/2014  . HTN (hypertension) 06/23/2014  . Prostate cancer ([place]) 06/23/2014  . Presence of suprapubic catheter ([place]) 06/17/2014  . Cervical myelopathy ([place])   . Arthritis   . Urinary incontinence, functional 10/25/2012  . Bilateral leg weakness 10/25/2012  . Fall at home 10/25/2012  . Lower urinary tract infectious disease 10/25/2012  . Sleep apnea 10/25/2012  . Medically noncompliant 10/25/2012  .  Obesity 10/25/2012    CMP     Component Value Date/Time   NA 138 11/05/2017   K 3.9 11/05/2017   CL 97 (L) 09/22/2017 0434   CO2 26 09/22/2017 0434   GLUCOSE 121 (H) 09/22/2017 0434   BUN 16 11/05/2017   CREATININE 1.3 11/05/2017   CREATININE 1.15 09/22/2017 0434   CALCIUM 8.7 (L) 09/22/2017 0434   PROT 7.4 09/21/2017 0743   ALBUMIN 2.7 (L) 09/21/2017 0743   AST 39 09/21/2017 0743   ALT 19 09/21/2017 0743   ALKPHOS 70 09/21/2017 0743   BILITOT 1.1 09/21/2017 0743   GFRNONAA >60 09/22/2017 0434   GFRAA >60 09/22/2017 0434   Recent Labs    09/17/17 1600 09/21/17 0629 09/22/17 0434 11/05/17  NA 137 134* 134* 138  K 3.6 3.8 3.7 3.9  CL 98* 94* 97*  --   CO2 30 27 26   --   GLUCOSE 121* 124* 121*  --   BUN 17 23* 20 16  CREATININE 1.13 1.35* 1.15 1.3  CALCIUM 9.0 9.0 8.7*  --    Recent Labs    11/26/16 1344 09/21/17 0743  AST 26 39  ALT 25 19  ALKPHOS 46 70  BILITOT 1.1 1.1   PROT 7.5 7.4  ALBUMIN 3.6 2.7*   Recent Labs    09/21/17 0743 09/22/17 0434 09/24/17 1441 09/25/17 0529 11/05/17  WBC  --  17.2* 9.8 11.2* 10.7  NEUTROABS 15.8*  --  6.9 7.9*  --   HGB  --  11.2* 11.5* 11.5* 11.8*  HCT  --  34.8* 35.6* 35.8* 36*  MCV  --  87.7 88.3 88.8  --   PLT  --  259 295 313 377   No results for input(s): CHOL, LDLCALC, TRIG in the last 8760 hours.  Invalid input(s): HCL No results found for: MICROALBUR Lab Results  Component Value Date   TSH 2.500 06/10/2014   Lab Results  Component Value Date   HGBA1C (H) 05/13/2007    6.3 (NOTE)   The ADA recommends the following therapeutic goals for glycemic   control related to Hgb A1C measurement:   Goal of Therapy:   < 7.0% Hgb A1C   Action Suggested:  > 8.0% Hgb A1C   Ref:  Diabetes Care, 22, Suppl. 1, 1999   No results found for: CHOL, HDL, LDLCALC, LDLDIRECT, TRIG, CHOLHDL  Significant Diagnostic Results in last 30 days:  No results found.  Assessment and Plan  Providencia rettgeri UTI/Proteus mirabilis UTI-he Proteus may be a colonization as patient was just recently treated for Proteus UTI but the [place] area is new; both are sensitive to Rocephin-therefore we'll use Rocephin 1 g IM daily for 7 days; patient says he is really glad we are treating his UTIs because he has been septic before with them and does not want to do that again  No problem-specific Assessment & Plan notes found for this encounter.   Labs/tests ordered:    Noah Delaine. Sheppard Coil, MD

## 2017-11-10 NOTE — Patient Outreach (Signed)
[facility]) Care Management  11/10/2017  Jerry [name] [birth year] [id number]   CSW covering for Nat Christen, LCSW, attempted to reach patient as well as left message at [facility] for updates. CSW will call again next week for update on his progress and disposition plans for SNF.   [name], MSW, [place] Worker  [place] [phone]

## 2017-11-11 ENCOUNTER — Encounter: Payer: Self-pay | Admitting: Internal Medicine

## 2017-11-11 NOTE — Progress Notes (Signed)
Location:  [facility] Room Number: 536U Place of Service:  SNF [phone]) Provider: Noah Delaine Stanisha Lorenz MD   Patient Care Team: Aura Dials, MD as PCP - General (Family Medicine) Tobi Bastos, RN as [place] Management [place], Maree Erie, LCSW as [place] Management Ulyses Southward., MD as Consulting Physician (Urology) Leandrew Koyanagi, MD as Consulting Physician (Orthopedic Surgery)  Extended Emergency Contact Information Primary Emergency Contact: Wylie Hail [address] [place] Phone: [phone] Relation: Friend Secondary Emergency Contact: Orbie Pyo, Willey Blade of [place] Phone: [phone] Mobile Phone: [phone] Relation: Friend    Allergies: Patient has no known allergies.  Chief Complaint  Patient presents with  . Acute Visit    ? UTI    HPI: Patient is 72 y.o. male who who I'm seeing acutely for a UTI. Several days ago patient had onset of weakness, bladder pain and blood in his Foley. Today urine sensitivities returned to with greater than 100,000 Proteus and greater than 100,000 staph aureus. Patient has not had any fever, nausea, vomiting, chills, or other systemic symptom.  Past Medical History:  Diagnosis Date  . Acute kidney injury ([place]) 09/21/2017  . Arthritis    "hands, knees, back, legs" (06/10/2014)  . Atonic neurogenic bladder 06/30/2016  . Bilateral leg weakness 10/25/2012  . Cervical myelopathy ([place])   . Chronic diastolic congestive heart failure ([place]) 06/23/2014  . Coma ([place]) 1968   "for 2 wks"  . Fall at home 10/25/2012  . Generalized weakness 06/23/2014  . FUXNATFT(732.2)    "monthly" (06/10/2014)  . Lower urinary tract infectious disease 10/25/2012  . Malaria   . Obesity 10/25/2012  . Presence of suprapubic catheter ([place]) 06/17/2014   Due to prostate cancer   . Prostate cancer ([place])   . PTSD  (post-traumatic stress disorder)    "I'm all over that now"  . Rhabdomyolysis 09/21/2017  . Sleep apnea 10/25/2012  . Staghorn calculus 06/30/2016  . Staphylococcus aureus bacteremia 06/30/2014  . Urinary incontinence, functional 10/25/2012  . UTI (urinary tract infection) 09/2017    Past Surgical History:  Procedure Laterality Date  . ANTERIOR CERVICAL DECOMP/DISCECTOMY FUSION  05/2007   Archie Endo 02/16/2011  . BACK SURGERY  2008   cervical and thoracic decompression for myelopathy  . PROSTATE SURGERY  03/2012   "shaved it down; didn't take it off"  . SHRAPNEL REMOVAL  1968   "HEAD, CHEST, LEFT HAND"  . SUPRAPUBIC CATHETER INSERTION    . THORACIC LAMINECTOMY  05/2007   Archie Endo 02/16/2011    Allergies as of 10/31/2017   No Known Allergies     Medication List        Accurate as of 10/31/17 11:59 PM. Always use your most recent med list.          CALCIUM 1000 + D PO Take 1 tablet by mouth daily.   CALCIUM PO Take 1,000 mg by mouth daily.   CO Q 10 PO Take 600 mg by mouth daily.   furosemide 20 MG tablet Commonly known as:  LASIX Take 20 mg by mouth. Take one tablet daily for HTN   GLUCOSAMINE CHONDR COMPLEX PO Take 600 mg by mouth daily.   meloxicam 7.5 MG tablet Commonly known as:  MOBIC Take 7.5 mg by mouth daily.   MULTIVITAMIN PO Take 1 tablet  by mouth daily.   polyethylene glycol packet Commonly known as:  MIRALAX Take 17 g by mouth 2 (two) times daily.   RESVERATROL PO Take 600 mg by mouth daily.   senna 8.6 MG Tabs tablet Commonly known as:  SENOKOT Take 2 tablets (17.2 mg total) by mouth at bedtime as needed for mild constipation or moderate constipation.   traMADol 50 MG tablet Commonly known as:  ULTRAM Take 1 tablet (50 mg total) by mouth every 6 (six) hours as needed for moderate pain.   vitamin B-12 1000 MCG tablet Commonly known as:  CYANOCOBALAMIN Take 2 tablets (2,000 mcg total) by mouth daily.   Vitamin D3 10000 units capsule Take 10,000  Units by mouth daily.   vitamin E 1000 UNIT capsule Take 1,000 Units by mouth daily.       No orders of the defined types were placed in this encounter.   Immunization History  Administered Date(s) Administered  . Influenza Split 10/25/2012  . Influenza,inj,Quad PF,6+ Mos 06/30/2014  . Influenza-Unspecified 08/22/2017  . Pneumococcal Polysaccharide-23 10/25/2012  . Pneumococcal-Unspecified 08/22/2017    Social History   Tobacco Use  . Smoking status: Never Smoker  . Smokeless tobacco: Never Used  . Tobacco comment: "smoked cigarettes  for ~ 1 wk in [place]"  Substance Use Topics  . Alcohol use: No    Review of Systems  DATA OBTAINED: from patient, nurse GENERAL:  no fevers, +fatigue, appetite changes SKIN: No itching, rash HEENT: No complaint RESPIRATORY: No cough, wheezing, SOB CARDIAC: No chest pain, palpitations, lower extremity edema  GI: No abdominal pain, No N/V/D or constipation, No heartburn or reflux  GU: No dysuria, frequency or urgency, or incontinence;+ bladder tenderness, Foley with blood  MUSCULOSKELETAL: No unrelieved bone/joint pain NEUROLOGIC: No headache, dizziness  PSYCHIATRIC: No overt anxiety or sadness  Vitals:   10/31/17 1536  BP: 117/63  Pulse: 67  Resp: 20  Temp: (!) 97.1 F (36.2 C)   Body mass index is 33.32 kg/m. Physical Exam  GENERAL APPEARANCE: Alert, conversant, No acute distress  SKIN: No diaphoresis rash HEENT: Unremarkable RESPIRATORY: Breathing is even, unlabored. Lung sounds are clear   CARDIOVASCULAR: Heart RRR no murmurs, rubs or gallops. No peripheral edema  GASTROINTESTINAL: Abdomen is soft, non-tender, not distended w/ normal bowel sounds.  GENITOURINARY: Bladder + tender, not distended  MUSCULOSKELETAL: No abnormal joints or musculature NEUROLOGIC: Cranial nerves 2-12 grossly intact. Moves all extremities PSYCHIATRIC: Mood and affect appropriate to situation, no behavioral issues  Patient Active Problem  List   Diagnosis Date Noted  . Urinary tract infection due to Proteus 09/29/2017  . Fall 09/29/2017  . Acute lateral meniscus tear of left knee 09/29/2017  . Acute medial meniscus tear of left knee 09/29/2017  . Vitamin B12 deficiency 09/29/2017  . Pressure injury of skin 09/22/2017  . Acute kidney injury ([place]) 09/21/2017  . Catheter-associated urinary tract infection ([place]) 09/21/2017  . Elevated CK 09/21/2017  . Rhabdomyolysis 09/21/2017  . Staghorn calculus 06/30/2016  . Atonic neurogenic bladder 06/30/2016  . Urinary retention 08/15/2014  . Staphylococcus aureus bacteremia 06/30/2014  . Generalized weakness 06/23/2014  . Chronic diastolic congestive heart failure ([place]) 06/23/2014  . HTN (hypertension) 06/23/2014  . Prostate cancer ([place]) 06/23/2014  . Presence of suprapubic catheter ([place]) 06/17/2014  . Cervical myelopathy ([place])   . Arthritis   . Urinary incontinence, functional 10/25/2012  . Bilateral leg weakness 10/25/2012  . Fall at home 10/25/2012  . Lower urinary tract infectious disease 10/25/2012  .  Sleep apnea 10/25/2012  . Medically noncompliant 10/25/2012  . Obesity 10/25/2012    CMP     Component Value Date/Time   NA 138 11/05/2017   K 3.9 11/05/2017   CL 97 (L) 09/22/2017 0434   CO2 26 09/22/2017 0434   GLUCOSE 121 (H) 09/22/2017 0434   BUN 16 11/05/2017   CREATININE 1.3 11/05/2017   CREATININE 1.15 09/22/2017 0434   CALCIUM 8.7 (L) 09/22/2017 0434   PROT 7.4 09/21/2017 0743   ALBUMIN 2.7 (L) 09/21/2017 0743   AST 39 09/21/2017 0743   ALT 19 09/21/2017 0743   ALKPHOS 70 09/21/2017 0743   BILITOT 1.1 09/21/2017 0743   GFRNONAA >60 09/22/2017 0434   GFRAA >60 09/22/2017 0434   Recent Labs    09/17/17 1600 09/21/17 0629 09/22/17 0434 11/05/17  NA 137 134* 134* 138  K 3.6 3.8 3.7 3.9  CL 98* 94* 97*  --   CO2 30 27 26   --   GLUCOSE 121* 124* 121*  --   BUN 17 23* 20 16  CREATININE 1.13 1.35* 1.15 1.3  CALCIUM 9.0 9.0 8.7*  --    Recent Labs     11/26/16 1344 09/21/17 0743  AST 26 39  ALT 25 19  ALKPHOS 46 70  BILITOT 1.1 1.1  PROT 7.5 7.4  ALBUMIN 3.6 2.7*   Recent Labs    09/21/17 0743 09/22/17 0434 09/24/17 1441 09/25/17 0529 11/05/17  WBC  --  17.2* 9.8 11.2* 10.7  NEUTROABS 15.8*  --  6.9 7.9*  --   HGB  --  11.2* 11.5* 11.5* 11.8*  HCT  --  34.8* 35.6* 35.8* 36*  MCV  --  87.7 88.3 88.8  --   PLT  --  259 295 313 377   No results for input(s): CHOL, LDLCALC, TRIG in the last 8760 hours.  Invalid input(s): HCL No results found for: MICROALBUR Lab Results  Component Value Date   TSH 2.500 06/10/2014   Lab Results  Component Value Date   HGBA1C (H) 05/13/2007    6.3 (NOTE)   The ADA recommends the following therapeutic goals for glycemic   control related to Hgb A1C measurement:   Goal of Therapy:   < 7.0% Hgb A1C   Action Suggested:  > 8.0% Hgb A1C   Ref:  Diabetes Care, 22, Suppl. 1, 1999   No results found for: CHOL, HDL, LDLCALC, LDLDIRECT, TRIG, CHOLHDL  Significant Diagnostic Results in last 30 days:  No results found.  Assessment and Plan  Proteus UTI-patient's calculated creatinine clearance is 101; therefore he can be on Levaquin 500 milligrams daily for 7 days    Inocencio Homes, MD

## 2017-11-12 ENCOUNTER — Other Ambulatory Visit: Payer: Self-pay | Admitting: *Deleted

## 2017-11-12 DIAGNOSIS — L89622 Pressure ulcer of left heel, stage 2: Secondary | ICD-10-CM | POA: Diagnosis not present

## 2017-11-12 NOTE — Patient Outreach (Signed)
[facility]) Care Management  11/12/2017  Jerry [name] August 02, 1946 [id number]   CSW received follow up call from SNF rep today who indicates patient continues to receive therapy and wound care at [facility].  No dc date or disposition has been determined.  CSW will attempt further outreach with patient as well as follow up later this week with SNF rep for updates and planning.   [name], MSW, LCSW (covering for Nat Christen, MSW, LCSW) [place] representative  [place] [phone]

## 2017-11-13 ENCOUNTER — Ambulatory Visit: Payer: Self-pay | Admitting: *Deleted

## 2017-11-15 ENCOUNTER — Encounter: Payer: Self-pay | Admitting: Internal Medicine

## 2017-11-16 ENCOUNTER — Other Ambulatory Visit: Payer: Self-pay | Admitting: *Deleted

## 2017-11-16 NOTE — Patient Outreach (Signed)
  [facility]) Care Management  11/16/2017  Agam Davenport 1946/06/23 [id number]   CSW spoke with Alyse Low at [facility] who indicates patient is progressing but no dc date has been identified.  Patient still plans to return home yet there are concerns related to this given his level of care needs/ etc.  Per SNF rep they will have a PT meeting on Tuesday to discuss his progress and dc plans further.   CSW will call next next week for update on progress and plans.   [name], MSW, [place] Worker [place] [phone]

## 2017-11-19 ENCOUNTER — Non-Acute Institutional Stay (SKILLED_NURSING_FACILITY): Payer: Medicare Other | Admitting: Internal Medicine

## 2017-11-19 ENCOUNTER — Encounter: Payer: Self-pay | Admitting: Internal Medicine

## 2017-11-19 DIAGNOSIS — I10 Essential (primary) hypertension: Secondary | ICD-10-CM | POA: Diagnosis not present

## 2017-11-19 DIAGNOSIS — I5032 Chronic diastolic (congestive) heart failure: Secondary | ICD-10-CM | POA: Diagnosis not present

## 2017-11-19 DIAGNOSIS — Z9359 Other cystostomy status: Secondary | ICD-10-CM | POA: Diagnosis not present

## 2017-11-19 DIAGNOSIS — L89620 Pressure ulcer of left heel, unstageable: Secondary | ICD-10-CM | POA: Diagnosis not present

## 2017-11-19 NOTE — Progress Notes (Signed)
Location:  [facility] Room Number: 161W Place of Service:  SNF (31)  Jerry [name]. Jerry Coil, MD  Patient Care Team: Aura Dials, MD as PCP - General (Family Medicine) Tobi Bastos, RN as [facility] [place], Maree Erie, LCSW as [facility] Ulyses Southward., MD as Consulting Physician (Urology) Leandrew Koyanagi, MD as Consulting Physician (Orthopedic Surgery)  Extended Emergency Contact Information Primary Emergency Contact: Wylie Hail [address] [place] Phone: [phone] Relation: Friend Secondary Emergency Contact: Orbie Pyo, [place] of [place] Phone: [phone] Mobile Phone: [phone] Relation: Friend    Allergies: Patient has no known allergies.  Chief Complaint  Patient presents with  . Medical Management of Chronic Issues    Routine Visit    HPI: Patient is 72 y.o. male who is being seen for routine issues of chronic diastolic congestive heart failure, hypertension, and presence of suprapubic catheter.  Past Medical History:  Diagnosis Date  . Acute kidney injury ([place]) 09/21/2017  . Arthritis    "hands, knees, back, legs" (06/10/2014)  . Atonic neurogenic bladder 06/30/2016  . Bilateral leg weakness 10/25/2012  . Cervical myelopathy ([place])   . Chronic diastolic congestive heart failure ([place]) 06/23/2014  . Coma ([place]) 1968   "for 2 wks"  . Fall at home 10/25/2012  . Generalized weakness 06/23/2014  . BMWUXLKG(401.0)    "monthly" (06/10/2014)  . Lower urinary tract infectious disease 10/25/2012  . Malaria   . Obesity 10/25/2012  . Presence of suprapubic catheter ([place]) 06/17/2014   Due to prostate cancer   . Prostate cancer ([place])   . PTSD (post-traumatic stress disorder)    "I'm all over that now"  . Rhabdomyolysis 09/21/2017  . Sleep apnea 10/25/2012  . Staghorn calculus 06/30/2016  . Staphylococcus  aureus bacteremia 06/30/2014  . Urinary incontinence, functional 10/25/2012  . UTI (urinary tract infection) 09/2017    Past Surgical History:  Procedure Laterality Date  . ANTERIOR CERVICAL DECOMP/DISCECTOMY FUSION  05/2007   Archie Endo 02/16/2011  . BACK SURGERY  2008   cervical and thoracic decompression for myelopathy  . PROSTATE SURGERY  03/2012   "shaved it down; didn't take it off"  . SHRAPNEL REMOVAL  1968   "HEAD, CHEST, LEFT HAND"  . SUPRAPUBIC CATHETER INSERTION    . THORACIC LAMINECTOMY  05/2007   Archie Endo 02/16/2011    Allergies as of 11/19/2017   No Known Allergies     Medication List        Accurate as of 11/19/17 11:59 PM. Always use your most recent med list.          CALCIUM 500 PO Take 1,000 mg by mouth daily. 2 tabs   CO Q 10 PO Take 600 mg by mouth daily.   feeding supplement (PRO-STAT SUGAR FREE 64) Liqd Take 30 mLs by mouth daily.   furosemide 20 MG tablet Commonly known as:  LASIX Take 20 mg by mouth daily.   GLUCOSAMINE CHONDR COMPLEX PO Take 600 mg by mouth daily.   meloxicam 7.5 MG tablet Commonly known as:  MOBIC Take 7.5 mg by mouth daily.   MULTIVITAMIN PO Take 1 tablet by mouth daily.   polyethylene glycol packet Commonly known as:  MIRALAX Take 17 g by mouth 2 (two) times daily.   senna 8.6 MG Tabs tablet Commonly known  asDonavan Burnet Take 2 tablets (17.2 mg total) by mouth at bedtime as needed for mild constipation or moderate constipation.   vitamin B-12 1000 MCG tablet Commonly known as:  CYANOCOBALAMIN Take 2 tablets (2,000 mcg total) by mouth daily.   Vitamin D3 10000 units capsule Take 10,000 Units by mouth daily.   vitamin E 1000 UNIT capsule Take 1,000 Units by mouth daily.       No orders of the defined types were placed in this encounter.   Immunization History  Administered Date(s) Administered  . Influenza Split 10/25/2012  . Influenza,inj,Quad PF,6+ Mos 06/30/2014  . Influenza-Unspecified 08/22/2017  .  Pneumococcal Polysaccharide-23 10/25/2012  . Pneumococcal-Unspecified 08/22/2017    Social History   Tobacco Use  . Smoking status: Never Smoker  . Smokeless tobacco: Never Used  . Tobacco comment: "smoked cigarettes  for ~ 1 wk in [place]"  Substance Use Topics  . Alcohol use: No    Review of Systems  DATA OBTAINED: from patient, nurse GENERAL:  no fevers, fatigue, appetite changes SKIN: No itching, rash HEENT: No complaint RESPIRATORY: No cough, wheezing, SOB CARDIAC: No chest pain, palpitations, lower extremity edema  GI: No abdominal pain, No N/V/D or constipation, No heartburn or reflux  GU: No dysuria, frequency or urgency, or incontinence  MUSCULOSKELETAL: No unrelieved bone/joint pain NEUROLOGIC: No headache, dizziness  PSYCHIATRIC: No overt anxiety or sadness  Vitals:   11/19/17 1457  BP: 114/66  Pulse: 96  Resp: 18  Temp: (!) 97.3 F (36.3 C)  SpO2: 95%   Body mass index is 32.73 kg/m. Physical Exam  GENERAL APPEARANCE: Alert, conversant, No acute distress  SKIN: No diaphoresis rash HEENT: Unremarkable RESPIRATORY: Breathing is even, unlabored. Lung sounds are clear   CARDIOVASCULAR: Heart RRR no murmurs, rubs or gallops. No peripheral edema  GASTROINTESTINAL: Abdomen is soft, non-tender, not distended w/ normal bowel sounds.  GENITOURINARY: Bladder non tender, not distended; catheter in place  MUSCULOSKELETAL: No abnormal joints or musculature NEUROLOGIC: Cranial nerves 2-12 grossly intact. Moves all extremities PSYCHIATRIC: Mood and affect appropriate to situation, no behavioral issues  Patient Active Problem List   Diagnosis Date Noted  . Urinary tract infection due to Proteus 09/29/2017  . Fall 09/29/2017  . Acute lateral meniscus tear of left knee 09/29/2017  . Acute medial meniscus tear of left knee 09/29/2017  . Vitamin B12 deficiency 09/29/2017  . Pressure injury of skin 09/22/2017  . Acute kidney injury ([place]) 09/21/2017  .  Catheter-associated urinary tract infection ([place]) 09/21/2017  . Elevated CK 09/21/2017  . Rhabdomyolysis 09/21/2017  . Staghorn calculus 06/30/2016  . Atonic neurogenic bladder 06/30/2016  . Urinary retention 08/15/2014  . Staphylococcus aureus bacteremia 06/30/2014  . Generalized weakness 06/23/2014  . Chronic diastolic congestive heart failure ([place]) 06/23/2014  . HTN (hypertension) 06/23/2014  . Prostate cancer ([place]) 06/23/2014  . Presence of suprapubic catheter ([place]) 06/17/2014  . Cervical myelopathy ([place])   . Arthritis   . Urinary incontinence, functional 10/25/2012  . Bilateral leg weakness 10/25/2012  . Fall at home 10/25/2012  . Lower urinary tract infectious disease 10/25/2012  . Sleep apnea 10/25/2012  . Medically noncompliant 10/25/2012  . Obesity 10/25/2012    CMP     Component Value Date/Time   NA 138 11/05/2017   K 3.9 11/05/2017   CL 97 (L) 09/22/2017 0434   CO2 26 09/22/2017 0434   GLUCOSE 121 (H) 09/22/2017 0434   BUN 16 11/05/2017   CREATININE 1.3 11/05/2017   CREATININE  1.15 09/22/2017 0434   CALCIUM 8.7 (L) 09/22/2017 0434   PROT 7.4 09/21/2017 0743   ALBUMIN 2.7 (L) 09/21/2017 0743   AST 39 09/21/2017 0743   ALT 19 09/21/2017 0743   ALKPHOS 70 09/21/2017 0743   BILITOT 1.1 09/21/2017 0743   GFRNONAA >60 09/22/2017 0434   GFRAA >60 09/22/2017 0434   Recent Labs    09/17/17 1600 09/21/17 0629 09/22/17 0434 11/05/17  NA 137 134* 134* 138  K 3.6 3.8 3.7 3.9  CL 98* 94* 97*  --   CO2 30 27 26   --   GLUCOSE 121* 124* 121*  --   BUN 17 23* 20 16  CREATININE 1.13 1.35* 1.15 1.3  CALCIUM 9.0 9.0 8.7*  --    Recent Labs    11/26/16 1344 09/21/17 0743  AST 26 39  ALT 25 19  ALKPHOS 46 70  BILITOT 1.1 1.1  PROT 7.5 7.4  ALBUMIN 3.6 2.7*   Recent Labs    09/21/17 0743 09/22/17 0434 09/24/17 1441 09/25/17 0529 11/05/17  WBC  --  17.2* 9.8 11.2* 10.7  NEUTROABS 15.8*  --  6.9 7.9*  --   HGB  --  11.2* 11.5* 11.5* 11.8*  HCT  --  34.8*  35.6* 35.8* 36*  MCV  --  87.7 88.3 88.8  --   PLT  --  259 295 313 377   No results for input(s): CHOL, LDLCALC, TRIG in the last 8760 hours.  Invalid input(s): HCL No results found for: MICROALBUR Lab Results  Component Value Date   TSH 2.500 06/10/2014   Lab Results  Component Value Date   HGBA1C (H) 05/13/2007    6.3 (NOTE)   The ADA recommends the following therapeutic goals for glycemic   control related to Hgb A1C measurement:   Goal of Therapy:   < 7.0% Hgb A1C   Action Suggested:  > 8.0% Hgb A1C   Ref:  Diabetes Care, 22, Suppl. 1, 1999   No results found for: CHOL, HDL, LDLCALC, LDLDIRECT, TRIG, CHOLHDL  Significant Diagnostic Results in last 30 days:  No results found.  Assessment and Plan  Chronic diastolic congestive heart failure Chronic and stable; controlled on daily Lasix 20 mg   HTN (hypertension) Controlled; continue Lasix 20 mg by mouth daily  Presence of suprapubic catheter [facility]) With resultant frequent UTIs; will continue to monitor    Anne D. Jerry Coil, MD

## 2017-11-20 ENCOUNTER — Encounter: Payer: Self-pay | Admitting: Internal Medicine

## 2017-11-20 NOTE — Assessment & Plan Note (Signed)
Chronic and stable; controlled on daily Lasix 20 mg

## 2017-11-20 NOTE — Assessment & Plan Note (Signed)
Controlled; continue Lasix 20 mg by mouth daily 

## 2017-11-20 NOTE — Assessment & Plan Note (Signed)
With resultant frequent UTIs; will continue to monitor

## 2017-11-21 ENCOUNTER — Ambulatory Visit: Payer: Self-pay | Admitting: *Deleted

## 2017-11-21 ENCOUNTER — Other Ambulatory Visit: Payer: Self-pay | Admitting: *Deleted

## 2017-11-22 ENCOUNTER — Other Ambulatory Visit: Payer: Self-pay | Admitting: *Deleted

## 2017-11-22 NOTE — Patient Outreach (Signed)
[facility]) Care Management  11/22/2017  Jerry [name] 1946/04/12 [id number]   Covering CSW contacted SNF rep today for update- she reports patient is "doing very well with therapy" and no dc date established as of yet.  This CSW will brief assigned [facility] CSW, Nat Christen, [place],  for f/u-     [name], MSW, [place] Worker  [place] [phone]

## 2017-11-26 ENCOUNTER — Other Ambulatory Visit: Payer: Self-pay | Admitting: Licensed Clinical Social Worker

## 2017-11-26 ENCOUNTER — Ambulatory Visit: Payer: Self-pay | Admitting: *Deleted

## 2017-11-26 DIAGNOSIS — L89620 Pressure ulcer of left heel, unstageable: Secondary | ICD-10-CM | POA: Diagnosis not present

## 2017-11-26 NOTE — Patient Outreach (Signed)
[facility]) Care Management  [facility] Social Work  11/26/2017  Jerry [name] Jun 12, 1946 [id number]   Encounter Medications:  Outpatient Encounter Medications as of 11/26/2017  Medication Sig Note  . Amino Acids-Protein Hydrolys (FEEDING SUPPLEMENT, PRO-STAT SUGAR FREE 64,) LIQD Take 30 mLs by mouth daily.   . Calcium-Magnesium-Vitamin D (CALCIUM 500 PO) Take 1,000 mg by mouth daily. 2 tabs   . Cholecalciferol (VITAMIN D3) 10000 units capsule Take 10,000 Units by mouth daily.   . Coenzyme Q10 (CO Q 10 PO) Take 600 mg by mouth daily.   . furosemide (LASIX) 20 MG tablet Take 20 mg by mouth daily.    . Glucosamine-Chondroitin (GLUCOSAMINE CHONDR COMPLEX PO) Take 600 mg by mouth daily.   . meloxicam (MOBIC) 7.5 MG tablet Take 7.5 mg by mouth daily. 08/29/2014: .   Marland Kitchen Multiple Vitamins-Minerals (MULTIVITAMIN PO) Take 1 tablet by mouth daily.   . polyethylene glycol (MIRALAX) packet Take 17 g by mouth 2 (two) times daily.   Marland Kitchen senna (SENOKOT) 8.6 MG TABS tablet Take 2 tablets (17.2 mg total) by mouth at bedtime as needed for mild constipation or moderate constipation.   . vitamin B-12 (CYANOCOBALAMIN) 1000 MCG tablet Take 2 tablets (2,000 mcg total) by mouth daily.   . vitamin E 1000 UNIT capsule Take 1,000 Units by mouth daily.     No facility-administered encounter medications on file as of 11/26/2017.     Functional Status:  In your present state of health, do you have any difficulty performing the following activities: 09/28/2017 09/21/2017  Hearing? N N  Vision? N N  Difficulty concentrating or making decisions? N N  Walking or climbing stairs? Y Y  Dressing or bathing? N N  Doing errands, shopping? Y Y  Comment - unable to walk  Preparing Food and eating ? Y -  Using the Toilet? Y -  In the past six months, have you accidently leaked urine? Y -  Do you have problems with loss of bowel control? N -  Managing your Medications? Y -  Managing your Finances? Y -   Housekeeping or managing your Housekeeping? Y -  Some recent data might be hidden    Fall/Depression Screening:  PHQ 2/9 Scores 09/28/2017 09/20/2017 08/11/2014 06/30/2014  PHQ - 2 Score 1 1 0 0    Assessment: [facility] CSW Jazlynne Milliner received referral from [place] for coverage of [facility]. Patient has been active with [facility] prior to my engagement today.  CSW arrived at [facility] and met with patient in his room. Patient was completing PT. Patient was able to do several seated push-up's during PT today as well as walk 10 ft per physical therapist. CSW introduced self, reason for visit and of [place] social work services. Patient reports that he has been at [facility] for 2 months now. Patient is alert, oriented x 4. Patient shares that his plan to eventually return back home. However, patient understands that given his level of care/needs that there are some concerns about his plan to return home. Patient shares that he lives alone and has no family support. Patient reports that his main support network includes some close friends and his 74 year old neighbor who assist him out with laundry. Patient reports that his neighbor has been coming by facility to check on him and drop off his laundry and mail weekly. Patient admits to numerous falls at home before SNF placement. Patient shares that he is financially  stable and that he will consider hiring a private pay caregiver if needed once he plans to discharge back home. CSW met with SNF social worker and discussed case. SNF social worker agreeable to coordinate with [facility] CSW.   THN CM Care Plan Problem One     Most Recent Value  Care Plan Problem One  Level of Care Issues.  Role Documenting the Problem One  Clinical Social Worker  Care Plan for Problem One  Active  [facility] Long Term Goal   Patient will have home heallth services and durable medical equipment in place at time of discharge from the skilled nursing facility, within  the next 45 days.  THN Long Term Goal Start Date  09/28/17  Interventions for Problem One Long Term Goal  Goal ongoing. No discharge plans made at this time other than plan to return home.  THN CM Short Term Goal #1   Patient will decide on a home health agency of choice with regards to home care services, within the next three weeks.  THN CM Short Term Goal #1 Start Date  11/26/17  Interventions for Short Term Goal #1  CSW has provided patient with a list of home health agencies offering home care services.  THN CM Short Term Goal #2   Patient will attend all upcoming medical appointments over the next 30 days  THN CM Short Term Goal #2 Start Date  11/26/17  Interventions for Short Term Goal #2  Motivational interviewing interventions provided to support patient in meeting this goal. Schedule reviewed as well.     Plan: CSW will follow up with SNF within one week. CSW will route encounter to PCP.  Eula Fried, BSW, MSW, [email or web address] Phone: [phone] Fax: [phone]

## 2017-12-03 DIAGNOSIS — L89620 Pressure ulcer of left heel, unstageable: Secondary | ICD-10-CM | POA: Diagnosis not present

## 2017-12-04 ENCOUNTER — Other Ambulatory Visit: Payer: Self-pay | Admitting: Licensed Clinical Social Worker

## 2017-12-04 NOTE — Patient Outreach (Signed)
[facility]) Care Management  [facility] Social Work  12/04/2017  Jerry [name] [birth year] [id number]  Encounter Medications:  Outpatient Encounter Medications as of 12/04/2017  Medication Sig Note  . Amino Acids-Protein Hydrolys (FEEDING SUPPLEMENT, PRO-STAT SUGAR FREE 64,) LIQD Take 30 mLs by mouth daily.   . Calcium-Magnesium-Vitamin D (CALCIUM 500 PO) Take 1,000 mg by mouth daily. 2 tabs   . Cholecalciferol (VITAMIN D3) 10000 units capsule Take 10,000 Units by mouth daily.   . Coenzyme Q10 (CO Q 10 PO) Take 600 mg by mouth daily.   . furosemide (LASIX) 20 MG tablet Take 20 mg by mouth daily.    . Glucosamine-Chondroitin (GLUCOSAMINE CHONDR COMPLEX PO) Take 600 mg by mouth daily.   . meloxicam (MOBIC) 7.5 MG tablet Take 7.5 mg by mouth daily. 08/29/2014: .   Marland Kitchen Multiple Vitamins-Minerals (MULTIVITAMIN PO) Take 1 tablet by mouth daily.   . polyethylene glycol (MIRALAX) packet Take 17 g by mouth 2 (two) times daily.   Marland Kitchen senna (SENOKOT) 8.6 MG TABS tablet Take 2 tablets (17.2 mg total) by mouth at bedtime as needed for mild constipation or moderate constipation.   . vitamin B-12 (CYANOCOBALAMIN) 1000 MCG tablet Take 2 tablets (2,000 mcg total) by mouth daily.   . vitamin E 1000 UNIT capsule Take 1,000 Units by mouth daily.     No facility-administered encounter medications on file as of 12/04/2017.     Functional Status:  In your present state of health, do you have any difficulty performing the following activities: 09/28/2017 09/21/2017  Hearing? N N  Vision? N N  Difficulty concentrating or making decisions? N N  Walking or climbing stairs? Y Y  Dressing or bathing? N N  Doing errands, shopping? Y Y  Comment - unable to walk  Preparing Food and eating ? Y -  Using the Toilet? Y -  In the past six months, have you accidently leaked urine? Y -  Do you have problems with loss of bowel control? N -  Managing your Medications? Y -  Managing your Finances? Y -   Housekeeping or managing your Housekeeping? Y -  Some recent data might be hidden    Fall/Depression Screening:  PHQ 2/9 Scores 09/28/2017 09/20/2017 08/11/2014 06/30/2014  PHQ - 2 Score 1 1 0 0    Assessment: CSW arrived at [facility] in order to complete SNF visit today. Patient reports that he is somewhat discouraged today because he has made some setbacks in terms of his walking and mobility. Patient reports that he thinks his bacteria infection may be back as well which may contribute to these set backs. Patient reports that due to these recent setbacks in PT and OT, he feels that he may have to consider LTC placement at this time. He reports "This is the first time I've said that out loud to anyone." CSW provided education on LTC placement options. Patient unsure if he would want ALF or a LTC nursing facility. However, patient is hopeful that his insurance would cover LTC placement as he does not qualify for Medicaid. CSW informed patient that Medicare does not cover LTC placement but patient wishes to re-visit this because of a document he received in the mail stating his updated Medicare benefits. Patient questioned if he had long term coverage and he stated he was unsure. CSW informed patient that she will let [place], SNF social worker know that he would like to move forward with the LTC placement process. CSW met with  SNF social worker Marita [place] who was very surprised that patient is now agreeable to LTC placement. Marita [place] is agreeable to meet with patient tomorrow on 12/05/17 to discuss plans.  Plan: CSW will follow up with SNF in one week and continue to follow patient and provide social work support until he discharges from SNF.  Eula Fried, BSW, MSW, [email or web address] Phone: [phone] Fax: [phone]

## 2017-12-07 ENCOUNTER — Non-Acute Institutional Stay (SKILLED_NURSING_FACILITY): Payer: Medicare Other | Admitting: Internal Medicine

## 2017-12-07 DIAGNOSIS — N390 Urinary tract infection, site not specified: Secondary | ICD-10-CM | POA: Diagnosis not present

## 2017-12-07 DIAGNOSIS — B965 Pseudomonas (aeruginosa) (mallei) (pseudomallei) as the cause of diseases classified elsewhere: Secondary | ICD-10-CM

## 2017-12-10 ENCOUNTER — Other Ambulatory Visit: Payer: Self-pay | Admitting: Licensed Clinical Social Worker

## 2017-12-10 DIAGNOSIS — L89620 Pressure ulcer of left heel, unstageable: Secondary | ICD-10-CM | POA: Diagnosis not present

## 2017-12-10 NOTE — Patient Outreach (Signed)
[facility]) Care Management  12/10/2017  Jerry [name] 09/21/1946 [id number]  Assessment- CSW completed call to [facility] and spoke to social worker [place]. She reports that she has not had a chance to speak to patient since he mentioned interest in possible ALF/LTC placement. SNF social worker agreeable to update [facility] CSW as needed. No discharge date has been set.  Eula Fried, BSW, MSW, [email or web address] Phone: [phone] Fax: [phone]

## 2017-12-13 ENCOUNTER — Encounter: Payer: Self-pay | Admitting: Internal Medicine

## 2017-12-13 NOTE — Progress Notes (Signed)
Location:  [place] of Service:  SNF [phone]) Provider: Hennie Duos MD   Patient Care Team: Aura Dials, MD as PCP - General (Family Medicine) Tobi Bastos, RN as [place] Management Ulyses Southward., MD as Consulting Physician (Urology) Leandrew Koyanagi, MD as Consulting Physician (Orthopedic Surgery) Greg Cutter, LCSW as [place] Management (Licensed Clinical Social Worker)  Extended Emergency Contact Information Primary Emergency Contact: Wylie Hail [address] [place] Phone: [phone] Relation: Friend Secondary Emergency Contact: Orbie Pyo, Willey Blade of [place] Phone: [phone] Mobile Phone: [phone] Relation: Friend    Allergies: Patient has no known allergies.  Chief Complaint  Patient presents with  . Acute Visit    HPI: Patient is 72 y.o. male who is being seen for a UTI; patient has greater than 100,000 Pseudomonas sensitive to second and third generation cephalosporins. Patient has been complaining of weakness and he has had follow-up smelling urine with sediment in it-it looks like pineapple juice. No fever or mental status change.  Past Medical History:  Diagnosis Date  . Acute kidney injury ([place]) 09/21/2017  . Arthritis    "hands, knees, back, legs" (06/10/2014)  . Atonic neurogenic bladder 06/30/2016  . Bilateral leg weakness 10/25/2012  . Cervical myelopathy ([place])   . Chronic diastolic congestive heart failure ([place]) 06/23/2014  . Coma ([place]) 1968   "for 2 wks"  . Fall at home 10/25/2012  . Generalized weakness 06/23/2014  . SNKNLZJQ(734.1)    "monthly" (06/10/2014)  . Lower urinary tract infectious disease 10/25/2012  . Malaria   . Obesity 10/25/2012  . Presence of suprapubic catheter ([place]) 06/17/2014   Due to prostate cancer   . Prostate cancer ([place])   . PTSD (post-traumatic stress disorder)    "I'm all over that now"  . Rhabdomyolysis 09/21/2017  . Sleep apnea 10/25/2012  . Staghorn calculus 06/30/2016  . Staphylococcus aureus bacteremia 06/30/2014  . Urinary incontinence, functional 10/25/2012  . UTI (urinary tract infection) 09/2017    Past Surgical History:  Procedure Laterality Date  . ANTERIOR CERVICAL DECOMP/DISCECTOMY FUSION  05/2007   Archie Endo 02/16/2011  . BACK SURGERY  2008   cervical and thoracic decompression for myelopathy  . PROSTATE SURGERY  03/2012   "shaved it down; didn't take it off"  . SHRAPNEL REMOVAL  1968   "HEAD, CHEST, LEFT HAND"  . SUPRAPUBIC CATHETER INSERTION    . THORACIC LAMINECTOMY  05/2007   Archie Endo 02/16/2011    Allergies as of 12/07/2017   No Known Allergies     Medication List        Accurate as of 12/07/17 11:59 PM. Always use your most recent med list.          CALCIUM 500 PO Take 1,000 mg by mouth daily. 2 tabs   CO Q 10 PO Take 600 mg by mouth daily.   feeding supplement (PRO-STAT SUGAR FREE 64) Liqd Take 30 mLs by mouth daily.   furosemide 20 MG tablet Commonly known as:  LASIX Take 20 mg by mouth daily.   GLUCOSAMINE CHONDR COMPLEX PO Take 600 mg by mouth daily.   meloxicam 7.5 MG tablet Commonly known as:  MOBIC Take 7.5 mg by mouth daily.   MULTIVITAMIN PO Take 1 tablet by mouth daily.   polyethylene glycol packet Commonly known  as:  MIRALAX Take 17 g by mouth 2 (two) times daily.   senna 8.6 MG Tabs tablet Commonly known as:  SENOKOT Take 2 tablets (17.2 mg total) by mouth at bedtime as needed for mild constipation or moderate constipation.   vitamin B-12 1000 MCG tablet Commonly known as:  CYANOCOBALAMIN Take 2 tablets (2,000 mcg total) by mouth daily.   Vitamin D3 10000 units capsule Take 10,000 Units by mouth daily.   vitamin E 1000 UNIT capsule Take 1,000 Units by mouth daily.       No orders of the defined types were placed in this encounter.   Immunization History  Administered Date(s)  Administered  . Influenza Split 10/25/2012  . Influenza,inj,Quad PF,6+ Mos 06/30/2014  . Influenza-Unspecified 08/22/2017  . Pneumococcal Polysaccharide-23 10/25/2012  . Pneumococcal-Unspecified 08/22/2017    Social History   Tobacco Use  . Smoking status: Never Smoker  . Smokeless tobacco: Never Used  . Tobacco comment: "smoked cigarettes  for ~ 1 wk in [place]"  Substance Use Topics  . Alcohol use: No    Review of Systems  DATA OBTAINED: from patient, nurse-as per history of present illness GENERAL:  no fevers, +fatigue, appetite changes SKIN: No itching, rash HEENT: No complaint RESPIRATORY: No cough, wheezing, SOB CARDIAC: No chest pain, palpitations, lower extremity edema  GI: No abdominal pain, No N/V/D or constipation, No heartburn or reflux  GU: No dysuria, frequency or urgency, or incontinence ; urine with odor and sediment MUSCULOSKELETAL: No unrelieved bone/joint pain NEUROLOGIC: No headache, dizziness  PSYCHIATRIC: No overt anxiety or sadness  Vitals:   12/13/17 1125  BP: 122/70  Pulse: 74  Resp: 18  Temp: (!) 97 F (36.1 C)   There is no height or weight on file to calculate BMI. Physical Exam  GENERAL APPEARANCE: Alert, conversant, No acute distress  SKIN: No diaphoresis rash HEENT: Unremarkable RESPIRATORY: Breathing is even, unlabored. Lung sounds are clear   CARDIOVASCULAR: Heart RRR no murmurs, rubs or gallops. No peripheral edema  GASTROINTESTINAL: Abdomen is soft, non-tender, not distended w/ normal bowel sounds.  GENITOURINARY: Bladder non tender, not distended ; urine with odor and sediment MUSCULOSKELETAL: No abnormal joints or musculature NEUROLOGIC: Cranial nerves 2-12 grossly intact. Moves all extremities PSYCHIATRIC: Mood and affect appropriate to situation, no behavioral issues  Patient Active Problem List   Diagnosis Date Noted  . Urinary tract infection due to Proteus 09/29/2017  . Fall 09/29/2017  . Acute lateral meniscus tear  of left knee 09/29/2017  . Acute medial meniscus tear of left knee 09/29/2017  . Vitamin B12 deficiency 09/29/2017  . Pressure injury of skin 09/22/2017  . Acute kidney injury ([place]) 09/21/2017  . Catheter-associated urinary tract infection ([place]) 09/21/2017  . Elevated CK 09/21/2017  . Rhabdomyolysis 09/21/2017  . Staghorn calculus 06/30/2016  . Atonic neurogenic bladder 06/30/2016  . Urinary retention 08/15/2014  . Staphylococcus aureus bacteremia 06/30/2014  . Generalized weakness 06/23/2014  . Chronic diastolic congestive heart failure ([place]) 06/23/2014  . HTN (hypertension) 06/23/2014  . Prostate cancer ([place]) 06/23/2014  . Presence of suprapubic catheter ([place]) 06/17/2014  . Cervical myelopathy ([place])   . Arthritis   . Urinary incontinence, functional 10/25/2012  . Bilateral leg weakness 10/25/2012  . Fall at home 10/25/2012  . Lower urinary tract infectious disease 10/25/2012  . Sleep apnea 10/25/2012  . Medically noncompliant 10/25/2012  . Obesity 10/25/2012    CMP     Component Value Date/Time   NA 138 11/05/2017   K  3.9 11/05/2017   CL 97 (L) 09/22/2017 0434   CO2 26 09/22/2017 0434   GLUCOSE 121 (H) 09/22/2017 0434   BUN 16 11/05/2017   CREATININE 1.3 11/05/2017   CREATININE 1.15 09/22/2017 0434   CALCIUM 8.7 (L) 09/22/2017 0434   PROT 7.4 09/21/2017 0743   ALBUMIN 2.7 (L) 09/21/2017 0743   AST 39 09/21/2017 0743   ALT 19 09/21/2017 0743   ALKPHOS 70 09/21/2017 0743   BILITOT 1.1 09/21/2017 0743   GFRNONAA >60 09/22/2017 0434   GFRAA >60 09/22/2017 0434   Recent Labs    09/17/17 1600 09/21/17 0629 09/22/17 0434 11/05/17  NA 137 134* 134* 138  K 3.6 3.8 3.7 3.9  CL 98* 94* 97*  --   CO2 30 27 26   --   GLUCOSE 121* 124* 121*  --   BUN 17 23* 20 16  CREATININE 1.13 1.35* 1.15 1.3  CALCIUM 9.0 9.0 8.7*  --    Recent Labs    09/21/17 0743  AST 39  ALT 19  ALKPHOS 70  BILITOT 1.1  PROT 7.4  ALBUMIN 2.7*   Recent Labs    09/21/17 0743  09/22/17 0434 09/24/17 1441 09/25/17 0529 11/05/17  WBC  --  17.2* 9.8 11.2* 10.7  NEUTROABS 15.8*  --  6.9 7.9*  --   HGB  --  11.2* 11.5* 11.5* 11.8*  HCT  --  34.8* 35.6* 35.8* 36*  MCV  --  87.7 88.3 88.8  --   PLT  --  259 295 313 377   No results for input(s): CHOL, LDLCALC, TRIG in the last 8760 hours.  Invalid input(s): HCL No results found for: MICROALBUR Lab Results  Component Value Date   TSH 2.500 06/10/2014   Lab Results  Component Value Date   HGBA1C (H) 05/13/2007    6.3 (NOTE)   The ADA recommends the following therapeutic goals for glycemic   control related to Hgb A1C measurement:   Goal of Therapy:   < 7.0% Hgb A1C   Action Suggested:  > 8.0% Hgb A1C   Ref:  Diabetes Care, 22, Suppl. 1, 1999   No results found for: CHOL, HDL, LDLCALC, LDLDIRECT, TRIG, CHOLHDL  Significant Diagnostic Results in last 30 days:  No results found.  Assessment and Plan  Pseudomonas UTI-greater than 100,000; sensitive to second and third generation cephalosporins; we have Omnicef here already so we'll treat with Omnicef 300 mg every 127 days    Inocencio Homes, MD

## 2017-12-17 ENCOUNTER — Non-Acute Institutional Stay (SKILLED_NURSING_FACILITY): Payer: Medicare Other | Admitting: Internal Medicine

## 2017-12-17 DIAGNOSIS — E559 Vitamin D deficiency, unspecified: Secondary | ICD-10-CM

## 2017-12-17 DIAGNOSIS — N312 Flaccid neuropathic bladder, not elsewhere classified: Secondary | ICD-10-CM

## 2017-12-17 DIAGNOSIS — E538 Deficiency of other specified B group vitamins: Secondary | ICD-10-CM

## 2017-12-17 DIAGNOSIS — L89620 Pressure ulcer of left heel, unstageable: Secondary | ICD-10-CM | POA: Diagnosis not present

## 2017-12-18 ENCOUNTER — Encounter: Payer: Self-pay | Admitting: Internal Medicine

## 2017-12-18 DIAGNOSIS — E559 Vitamin D deficiency, unspecified: Secondary | ICD-10-CM | POA: Insufficient documentation

## 2017-12-18 NOTE — Progress Notes (Signed)
Location:  [place] of Service:  SNF (31) Provider:Telly Jawad D Rob Mciver MD    Patient Care Team: Aura Dials, MD as PCP - General (Family Medicine) Tobi Bastos, RN as [place] Management Ulyses Southward., MD as Consulting Physician (Urology) Leandrew Koyanagi, MD as Consulting Physician (Orthopedic Surgery) Greg Cutter, LCSW as [place] Management (Licensed Clinical Social Worker)  Extended Emergency Contact Information Primary Emergency Contact: Wylie Hail [address] [place] Phone: [phone] Relation: Friend Secondary Emergency Contact: Orbie Pyo, Willey Blade of [place] Phone: [phone] Mobile Phone: [phone] Relation: Friend    Allergies: Patient has no known allergies.  Chief Complaint  Patient presents with  . Medical Management of Chronic Issues    HPI: Patient is 72 y.o. male who is being seen for routine issues of neurogenic bladder, vitamin D deficiency, and vitamin B12 deficiency.   Past Medical History:  Diagnosis Date  . Acute kidney injury ([place]) 09/21/2017  . Arthritis    "hands, knees, back, legs" (06/10/2014)  . Atonic neurogenic bladder 06/30/2016  . Bilateral leg weakness 10/25/2012  . Cervical myelopathy ([place])   . Chronic diastolic congestive heart failure ([place]) 06/23/2014  . Coma ([place]) 1968   "for 2 wks"  . Fall at home 10/25/2012  . Generalized weakness 06/23/2014  . BTDVVOHY(073.7)    "monthly" (06/10/2014)  . Lower urinary tract infectious disease 10/25/2012  . Malaria   . Obesity 10/25/2012  . Presence of suprapubic catheter ([place]) 06/17/2014   Due to prostate cancer   . Prostate cancer ([place])   . PTSD (post-traumatic stress disorder)    "I'm all over that now"  . Rhabdomyolysis 09/21/2017  . Sleep apnea 10/25/2012  . Staghorn calculus 06/30/2016  . Staphylococcus aureus bacteremia 06/30/2014  .  Urinary incontinence, functional 10/25/2012  . UTI (urinary tract infection) 09/2017    Past Surgical History:  Procedure Laterality Date  . ANTERIOR CERVICAL DECOMP/DISCECTOMY FUSION  05/2007   Archie Endo 02/16/2011  . BACK SURGERY  2008   cervical and thoracic decompression for myelopathy  . PROSTATE SURGERY  03/2012   "shaved it down; didn't take it off"  . SHRAPNEL REMOVAL  1968   "HEAD, CHEST, LEFT HAND"  . SUPRAPUBIC CATHETER INSERTION    . THORACIC LAMINECTOMY  05/2007   Archie Endo 02/16/2011    Allergies as of 12/17/2017   No Known Allergies     Medication List        Accurate as of 12/17/17 11:59 PM. Always use your most recent med list.          CALCIUM 500 PO Take 1,000 mg by mouth daily. 2 tabs   CO Q 10 PO Take 600 mg by mouth daily.   feeding supplement (PRO-STAT SUGAR FREE 64) Liqd Take 30 mLs by mouth daily.   furosemide 20 MG tablet Commonly known as:  LASIX Take 20 mg by mouth daily.   GLUCOSAMINE CHONDR COMPLEX PO Take 600 mg by mouth daily.   meloxicam 7.5 MG tablet Commonly known as:  MOBIC Take 7.5 mg by mouth daily.   MULTIVITAMIN PO Take 1 tablet by mouth daily.   polyethylene glycol packet Commonly known as:  MIRALAX Take 17 g by mouth 2 (two) times daily.   senna 8.6 MG Tabs tablet Commonly known as:  SENOKOT Take  2 tablets (17.2 mg total) by mouth at bedtime as needed for mild constipation or moderate constipation.   vitamin B-12 1000 MCG tablet Commonly known as:  CYANOCOBALAMIN Take 2 tablets (2,000 mcg total) by mouth daily.   Vitamin D3 10000 units capsule Take 10,000 Units by mouth daily.   vitamin E 1000 UNIT capsule Take 1,000 Units by mouth daily.       No orders of the defined types were placed in this encounter.   Immunization History  Administered Date(s) Administered  . Influenza Split 10/25/2012  . Influenza,inj,Quad PF,6+ Mos 06/30/2014  . Influenza-Unspecified 08/22/2017  . Pneumococcal Polysaccharide-23  10/25/2012  . Pneumococcal-Unspecified 08/22/2017    Social History   Tobacco Use  . Smoking status: Never Smoker  . Smokeless tobacco: Never Used  . Tobacco comment: "smoked cigarettes  for ~ 1 wk in [place]"  Substance Use Topics  . Alcohol use: No    Review of Systems  DATA OBTAINED: from patient, nurse GENERAL:  no fevers, fatigue, appetite changes SKIN: No itching, rash HEENT: No complaint RESPIRATORY: No cough, wheezing, SOB CARDIAC: No chest pain, palpitations, lower extremity edema  GI: No abdominal pain, No N/V/D or constipation, No heartburn or reflux  GU: No dysuria, frequency or urgency, or incontinence  MUSCULOSKELETAL: No unrelieved bone/joint pain NEUROLOGIC: No headache, dizziness  PSYCHIATRIC: No overt anxiety or sadness  Vitals:   12/18/17 1945  BP: 113/77  Pulse: 97  Resp: 20  Temp: 98 F (36.7 C)   There is no height or weight on file to calculate BMI. Physical Exam  GENERAL APPEARANCE: Alert, conversant, No acute distress  SKIN: No diaphoresis rash HEENT: Unremarkable RESPIRATORY: Breathing is even, unlabored. Lung sounds are clear   CARDIOVASCULAR: Heart RRR no murmurs, rubs or gallops. No peripheral edema  GASTROINTESTINAL: Abdomen is soft, non-tender, not distended w/ normal bowel sounds.  GENITOURINARY: Bladder non tender, not distended ; Foley with clear urine with sediment MUSCULOSKELETAL: No abnormal joints or musculature NEUROLOGIC: Cranial nerves 2-12 grossly intact. Moves all extremities PSYCHIATRIC: Mood and affect appropriate to situation, no behavioral issues  Patient Active Problem List   Diagnosis Date Noted  . Vitamin D deficiency 12/18/2017  . Urinary tract infection due to Proteus 09/29/2017  . Fall 09/29/2017  . Acute lateral meniscus tear of left knee 09/29/2017  . Acute medial meniscus tear of left knee 09/29/2017  . Vitamin B12 deficiency 09/29/2017  . Pressure injury of skin 09/22/2017  . Acute kidney injury ([place])  09/21/2017  . Catheter-associated urinary tract infection ([place]) 09/21/2017  . Elevated CK 09/21/2017  . Rhabdomyolysis 09/21/2017  . Staghorn calculus 06/30/2016  . Atonic neurogenic bladder 06/30/2016  . Urinary retention 08/15/2014  . Staphylococcus aureus bacteremia 06/30/2014  . Generalized weakness 06/23/2014  . Chronic diastolic congestive heart failure ([place]) 06/23/2014  . HTN (hypertension) 06/23/2014  . Prostate cancer ([place]) 06/23/2014  . Presence of suprapubic catheter ([place]) 06/17/2014  . Cervical myelopathy ([place])   . Arthritis   . Urinary incontinence, functional 10/25/2012  . Bilateral leg weakness 10/25/2012  . Fall at home 10/25/2012  . Lower urinary tract infectious disease 10/25/2012  . Sleep apnea 10/25/2012  . Medically noncompliant 10/25/2012  . Obesity 10/25/2012    CMP     Component Value Date/Time   NA 138 11/05/2017   K 3.9 11/05/2017   CL 97 (L) 09/22/2017 0434   CO2 26 09/22/2017 0434   GLUCOSE 121 (H) 09/22/2017 0434   BUN 16 11/05/2017  CREATININE 1.3 11/05/2017   CREATININE 1.15 09/22/2017 0434   CALCIUM 8.7 (L) 09/22/2017 0434   PROT 7.4 09/21/2017 0743   ALBUMIN 2.7 (L) 09/21/2017 0743   AST 39 09/21/2017 0743   ALT 19 09/21/2017 0743   ALKPHOS 70 09/21/2017 0743   BILITOT 1.1 09/21/2017 0743   GFRNONAA >60 09/22/2017 0434   GFRAA >60 09/22/2017 0434   Recent Labs    09/17/17 1600 09/21/17 0629 09/22/17 0434 11/05/17  NA 137 134* 134* 138  K 3.6 3.8 3.7 3.9  CL 98* 94* 97*  --   CO2 30 27 26   --   GLUCOSE 121* 124* 121*  --   BUN 17 23* 20 16  CREATININE 1.13 1.35* 1.15 1.3  CALCIUM 9.0 9.0 8.7*  --    Recent Labs    09/21/17 0743  AST 39  ALT 19  ALKPHOS 70  BILITOT 1.1  PROT 7.4  ALBUMIN 2.7*   Recent Labs    09/21/17 0743 09/22/17 0434 09/24/17 1441 09/25/17 0529 11/05/17  WBC  --  17.2* 9.8 11.2* 10.7  NEUTROABS 15.8*  --  6.9 7.9*  --   HGB  --  11.2* 11.5* 11.5* 11.8*  HCT  --  34.8* 35.6* 35.8* 36*    MCV  --  87.7 88.3 88.8  --   PLT  --  259 295 313 377   No results for input(s): CHOL, LDLCALC, TRIG in the last 8760 hours.  Invalid input(s): HCL No results found for: MICROALBUR Lab Results  Component Value Date   TSH 2.500 06/10/2014   Lab Results  Component Value Date   HGBA1C (H) 05/13/2007    6.3 (NOTE)   The ADA recommends the following therapeutic goals for glycemic   control related to Hgb A1C measurement:   Goal of Therapy:   < 7.0% Hgb A1C   Action Suggested:  > 8.0% Hgb A1C   Ref:  Diabetes Care, 22, Suppl. 1, 1999   No results found for: CHOL, HDL, LDLCALC, LDLDIRECT, TRIG, CHOLHDL  Significant Diagnostic Results in last 30 days:  No results found.  Assessment and Plan  Atonic neurogenic bladder With chronic indwelling Foley catheter, the source of multiple UTIs; have consulted urologist in [place] (patient has been seen in [facility] previously)  Vitamin D deficiency Continue replacement vitamin D of 10,000 units daily  Vitamin B12 deficiency Continue replacement in thousand units daily; stable    Inocencio Homes, MD

## 2017-12-18 NOTE — Assessment & Plan Note (Signed)
Continue replacement vitamin D of 10,000 units daily

## 2017-12-18 NOTE — Assessment & Plan Note (Signed)
With chronic indwelling Foley catheter, the source of multiple UTIs; have consulted urologist in [place] (patient has been seen in [facility] previously)

## 2017-12-18 NOTE — Assessment & Plan Note (Signed)
Continue replacement in thousand units daily; stable

## 2017-12-20 ENCOUNTER — Other Ambulatory Visit: Payer: Self-pay | Admitting: *Deleted

## 2017-12-20 NOTE — Patient Outreach (Signed)
[facility]) Care Management  12/20/2017  Jerry [name] Aug 08, 1946 [id number]  This [facility] CSW covering today was able to make contact with SNF rep, Marita Kansas, who reports patient's insurance coverage/ days will end mid March (around 01/03/18) and he will then pay privately for 30 days.   Per Marita Kansas, pt is hoping to make more progress during that time so he can return home but does know that he may need some sort of long term facility/placement.   CSW will update [facility] CSW upon her return next week for further planning and follow up.   [name], MSW, [place] Worker  [place] [phone]

## 2017-12-21 DIAGNOSIS — Z9359 Other cystostomy status: Secondary | ICD-10-CM | POA: Diagnosis not present

## 2017-12-21 DIAGNOSIS — N390 Urinary tract infection, site not specified: Secondary | ICD-10-CM | POA: Diagnosis not present

## 2017-12-21 DIAGNOSIS — N319 Neuromuscular dysfunction of bladder, unspecified: Secondary | ICD-10-CM | POA: Diagnosis not present

## 2017-12-21 DIAGNOSIS — N200 Calculus of kidney: Secondary | ICD-10-CM | POA: Diagnosis not present

## 2017-12-25 ENCOUNTER — Other Ambulatory Visit: Payer: Self-pay | Admitting: *Deleted

## 2017-12-25 NOTE — Patient Outreach (Signed)
[facility]) Care Management  12/25/2017  Jerry [name] 1945-12-09 [id number]   Phone call to discharge planner-Kristie Goosby,  to discuss patient's status in rehab at [facility]. This Education officer, museum is covering for [name] in her absence. Per Drue Dun, patient has now realized that returning home may not be an option at this time. Patient's last day of skilled service will be on 01/03/18 at which time he will remain at the facility for a 30 day period under long term care status. He will assess his placement needs at that time to determine if he will be able to return home.   Plan: This Education officer, museum will inform currently assigned social worker Eula Fried of current plan upon her return.   Sheralyn Boatman [facility] Care Management [phone]

## 2017-12-28 DIAGNOSIS — N390 Urinary tract infection, site not specified: Secondary | ICD-10-CM | POA: Diagnosis not present

## 2017-12-28 DIAGNOSIS — L89620 Pressure ulcer of left heel, unstageable: Secondary | ICD-10-CM | POA: Diagnosis not present

## 2017-12-31 DIAGNOSIS — L89620 Pressure ulcer of left heel, unstageable: Secondary | ICD-10-CM | POA: Diagnosis not present

## 2018-01-01 DIAGNOSIS — N200 Calculus of kidney: Secondary | ICD-10-CM | POA: Diagnosis not present

## 2018-01-01 DIAGNOSIS — N1330 Unspecified hydronephrosis: Secondary | ICD-10-CM | POA: Diagnosis not present

## 2018-01-01 DIAGNOSIS — Z9359 Other cystostomy status: Secondary | ICD-10-CM | POA: Diagnosis not present

## 2018-01-01 DIAGNOSIS — N319 Neuromuscular dysfunction of bladder, unspecified: Secondary | ICD-10-CM | POA: Diagnosis not present

## 2018-01-03 ENCOUNTER — Other Ambulatory Visit: Payer: Self-pay | Admitting: Licensed Clinical Social Worker

## 2018-01-03 NOTE — Patient Outreach (Signed)
[facility]) Care Management  01/03/2018  Jerry [name] [birth year] [id number]  Assessment- CSW completed call to [name] and spoke to SNF social worker Drue Dun. CSW confirmed that patient realized that returning home was not a safe option for him at this time. Patient's last day of skilled service was today on 01/03/18 and he has now transitioned his care at facility to long term care status. [facility] CSW will complete case closure at this time as patent is now under LTC and not appropriate for [facility].   Plan-CSW will notify [facility] PCP and [facility] Care Management Assistant of case closure.  Eula Fried, BSW, MSW, [email or web address] Phone: [phone] Fax: [phone]

## 2018-01-04 DIAGNOSIS — T83511D Infection and inflammatory reaction due to indwelling urethral catheter, subsequent encounter: Secondary | ICD-10-CM | POA: Diagnosis not present

## 2018-01-04 DIAGNOSIS — Z9181 History of falling: Secondary | ICD-10-CM | POA: Diagnosis not present

## 2018-01-04 DIAGNOSIS — R2681 Unsteadiness on feet: Secondary | ICD-10-CM | POA: Diagnosis not present

## 2018-01-04 DIAGNOSIS — N390 Urinary tract infection, site not specified: Secondary | ICD-10-CM | POA: Diagnosis not present

## 2018-01-04 DIAGNOSIS — M6281 Muscle weakness (generalized): Secondary | ICD-10-CM | POA: Diagnosis not present

## 2018-01-05 DIAGNOSIS — R2681 Unsteadiness on feet: Secondary | ICD-10-CM | POA: Diagnosis not present

## 2018-01-05 DIAGNOSIS — Z9181 History of falling: Secondary | ICD-10-CM | POA: Diagnosis not present

## 2018-01-05 DIAGNOSIS — T83511D Infection and inflammatory reaction due to indwelling urethral catheter, subsequent encounter: Secondary | ICD-10-CM | POA: Diagnosis not present

## 2018-01-05 DIAGNOSIS — N390 Urinary tract infection, site not specified: Secondary | ICD-10-CM | POA: Diagnosis not present

## 2018-01-05 DIAGNOSIS — M6281 Muscle weakness (generalized): Secondary | ICD-10-CM | POA: Diagnosis not present

## 2018-01-06 DIAGNOSIS — M6281 Muscle weakness (generalized): Secondary | ICD-10-CM | POA: Diagnosis not present

## 2018-01-06 DIAGNOSIS — N390 Urinary tract infection, site not specified: Secondary | ICD-10-CM | POA: Diagnosis not present

## 2018-01-06 DIAGNOSIS — Z9181 History of falling: Secondary | ICD-10-CM | POA: Diagnosis not present

## 2018-01-06 DIAGNOSIS — T83511D Infection and inflammatory reaction due to indwelling urethral catheter, subsequent encounter: Secondary | ICD-10-CM | POA: Diagnosis not present

## 2018-01-06 DIAGNOSIS — R2681 Unsteadiness on feet: Secondary | ICD-10-CM | POA: Diagnosis not present

## 2018-01-07 DIAGNOSIS — L89620 Pressure ulcer of left heel, unstageable: Secondary | ICD-10-CM | POA: Diagnosis not present

## 2018-01-07 DIAGNOSIS — R2681 Unsteadiness on feet: Secondary | ICD-10-CM | POA: Diagnosis not present

## 2018-01-07 DIAGNOSIS — N390 Urinary tract infection, site not specified: Secondary | ICD-10-CM | POA: Diagnosis not present

## 2018-01-07 DIAGNOSIS — T83511D Infection and inflammatory reaction due to indwelling urethral catheter, subsequent encounter: Secondary | ICD-10-CM | POA: Diagnosis not present

## 2018-01-07 DIAGNOSIS — M6281 Muscle weakness (generalized): Secondary | ICD-10-CM | POA: Diagnosis not present

## 2018-01-07 DIAGNOSIS — Z9181 History of falling: Secondary | ICD-10-CM | POA: Diagnosis not present

## 2018-01-08 DIAGNOSIS — N390 Urinary tract infection, site not specified: Secondary | ICD-10-CM | POA: Diagnosis not present

## 2018-01-08 DIAGNOSIS — Z9181 History of falling: Secondary | ICD-10-CM | POA: Diagnosis not present

## 2018-01-08 DIAGNOSIS — M6281 Muscle weakness (generalized): Secondary | ICD-10-CM | POA: Diagnosis not present

## 2018-01-08 DIAGNOSIS — T83511D Infection and inflammatory reaction due to indwelling urethral catheter, subsequent encounter: Secondary | ICD-10-CM | POA: Diagnosis not present

## 2018-01-08 DIAGNOSIS — R2681 Unsteadiness on feet: Secondary | ICD-10-CM | POA: Diagnosis not present

## 2018-01-09 DIAGNOSIS — N390 Urinary tract infection, site not specified: Secondary | ICD-10-CM | POA: Diagnosis not present

## 2018-01-09 DIAGNOSIS — M6281 Muscle weakness (generalized): Secondary | ICD-10-CM | POA: Diagnosis not present

## 2018-01-09 DIAGNOSIS — R2681 Unsteadiness on feet: Secondary | ICD-10-CM | POA: Diagnosis not present

## 2018-01-09 DIAGNOSIS — T83511D Infection and inflammatory reaction due to indwelling urethral catheter, subsequent encounter: Secondary | ICD-10-CM | POA: Diagnosis not present

## 2018-01-09 DIAGNOSIS — Z9181 History of falling: Secondary | ICD-10-CM | POA: Diagnosis not present

## 2018-01-10 DIAGNOSIS — R2681 Unsteadiness on feet: Secondary | ICD-10-CM | POA: Diagnosis not present

## 2018-01-10 DIAGNOSIS — N390 Urinary tract infection, site not specified: Secondary | ICD-10-CM | POA: Diagnosis not present

## 2018-01-10 DIAGNOSIS — M6281 Muscle weakness (generalized): Secondary | ICD-10-CM | POA: Diagnosis not present

## 2018-01-10 DIAGNOSIS — Z9181 History of falling: Secondary | ICD-10-CM | POA: Diagnosis not present

## 2018-01-10 DIAGNOSIS — T83511D Infection and inflammatory reaction due to indwelling urethral catheter, subsequent encounter: Secondary | ICD-10-CM | POA: Diagnosis not present

## 2018-01-14 DIAGNOSIS — L89620 Pressure ulcer of left heel, unstageable: Secondary | ICD-10-CM | POA: Diagnosis not present

## 2018-01-21 DIAGNOSIS — L89620 Pressure ulcer of left heel, unstageable: Secondary | ICD-10-CM | POA: Diagnosis not present

## 2018-01-28 DIAGNOSIS — N118 Other chronic tubulo-interstitial nephritis: Secondary | ICD-10-CM | POA: Diagnosis not present

## 2018-01-28 DIAGNOSIS — N319 Neuromuscular dysfunction of bladder, unspecified: Secondary | ICD-10-CM | POA: Diagnosis not present

## 2018-01-28 DIAGNOSIS — N390 Urinary tract infection, site not specified: Secondary | ICD-10-CM | POA: Diagnosis not present

## 2018-01-28 DIAGNOSIS — N200 Calculus of kidney: Secondary | ICD-10-CM | POA: Diagnosis not present

## 2018-02-04 DIAGNOSIS — L89623 Pressure ulcer of left heel, stage 3: Secondary | ICD-10-CM | POA: Diagnosis not present

## 2018-02-06 ENCOUNTER — Encounter: Payer: Self-pay | Admitting: Internal Medicine

## 2018-02-06 ENCOUNTER — Non-Acute Institutional Stay (SKILLED_NURSING_FACILITY): Payer: Medicare Other | Admitting: Internal Medicine

## 2018-02-06 DIAGNOSIS — N118 Other chronic tubulo-interstitial nephritis: Secondary | ICD-10-CM | POA: Diagnosis not present

## 2018-02-06 DIAGNOSIS — E559 Vitamin D deficiency, unspecified: Secondary | ICD-10-CM

## 2018-02-06 DIAGNOSIS — I10 Essential (primary) hypertension: Secondary | ICD-10-CM

## 2018-02-06 NOTE — Progress Notes (Signed)
Location:  [facility] Room Number: [place]:  SNF [phone])  Provider: Noah Delaine. Sheppard Coil, MD  Aura Dials, MD  Patient Care Team: Aura Dials, MD as PCP - General (Family Medicine) Tobi Bastos, RN as [place] Management Ulyses Southward., MD as Consulting Physician (Urology) Leandrew Koyanagi, MD as Consulting Physician (Orthopedic Surgery)  Extended Emergency Contact Information Primary Emergency Contact: Wylie Hail [address] [place] Phone: [phone] Relation: Friend Secondary Emergency Contact: Orbie Pyo, Willey [place] of [place] Phone: [phone] Mobile Phone: [phone] Relation: Friend    Allergies: Patient has no known allergies.  Chief Complaint  Patient presents with  . Medical Management of Chronic Issues    HPI: Patient is 72 y.o. male who is being seen for routine issues of xanthogranulomatous pyelonephritis, vitamin D deficiency, and hypertension.  Past Medical History:  Diagnosis Date  . Acute kidney injury ([place]) 09/21/2017  . Arthritis    "hands, knees, back, legs" (06/10/2014)  . Atonic neurogenic bladder 06/30/2016  . Bilateral leg weakness 10/25/2012  . Cervical myelopathy ([place])   . Chronic diastolic congestive heart failure ([place]) 06/23/2014  . Coma ([place]) 1968   "for 2 wks"  . Fall at home 10/25/2012  . Generalized weakness 06/23/2014  . PYPPJKDT(267.1)    "monthly" (06/10/2014)  . Lower urinary tract infectious disease 10/25/2012  . Malaria   . Obesity 10/25/2012  . Presence of suprapubic catheter ([place]) 06/17/2014   Due to prostate cancer   . Prostate cancer ([place])   . PTSD (post-traumatic stress disorder)    "I'm all over that now"  . Rhabdomyolysis 09/21/2017  . Sleep apnea 10/25/2012  . Staghorn calculus 06/30/2016  . Staphylococcus aureus bacteremia 06/30/2014  . Urinary incontinence, functional 10/25/2012    . UTI (urinary tract infection) 09/2017    Past Surgical History:  Procedure Laterality Date  . ANTERIOR CERVICAL DECOMP/DISCECTOMY FUSION  05/2007   Archie Endo 02/16/2011  . BACK SURGERY  2008   cervical and thoracic decompression for myelopathy  . PROSTATE SURGERY  03/2012   "shaved it down; didn't take it off"  . SHRAPNEL REMOVAL  1968   "HEAD, CHEST, LEFT HAND"  . SUPRAPUBIC CATHETER INSERTION    . THORACIC LAMINECTOMY  05/2007   Archie Endo 02/16/2011    Allergies as of 02/06/2018   No Known Allergies     Medication List        Accurate as of 02/06/18 11:59 PM. Always use your most recent med list.          CALCIUM 500 PO Take 1,000 mg by mouth daily. 2 tabs   CO Q 10 PO Take 600 mg by mouth daily.   feeding supplement (PRO-STAT SUGAR FREE 64) Liqd Take 30 mLs by mouth daily.   furosemide 20 MG tablet Commonly known as:  LASIX Take 20 mg by mouth daily.   GLUCOSAMINE CHONDR COMPLEX PO Take 600 mg by mouth daily.   meloxicam 7.5 MG tablet Commonly known as:  MOBIC Take 7.5 mg by mouth daily.   MULTIVITAMIN PO Take 1 tablet by mouth daily.   polyethylene glycol packet Commonly known as:  MIRALAX Take 17 g by mouth 2 (two) times daily.   senna 8.6 MG Tabs tablet Commonly known as:  SENOKOT Take 2 tablets (17.2 mg total) by mouth at bedtime  as needed for mild constipation or moderate constipation.   vitamin B-12 1000 MCG tablet Commonly known as:  CYANOCOBALAMIN Take 2 tablets (2,000 mcg total) by mouth daily.   Vitamin D3 10000 units capsule Take 10,000 Units by mouth daily.   vitamin E 1000 UNIT capsule Take 1,000 Units by mouth daily.       No orders of the defined types were placed in this encounter.   Immunization History  Administered Date(s) Administered  . Influenza Split 10/25/2012  . Influenza,inj,Quad PF,6+ Mos 06/30/2014  . Influenza-Unspecified 08/22/2017  . Pneumococcal Polysaccharide-23 10/25/2012  . Pneumococcal-Unspecified  08/22/2017    Social History   Tobacco Use  . Smoking status: Never Smoker  . Smokeless tobacco: Never Used  . Tobacco comment: "smoked cigarettes  for ~ 1 wk in [place]"  Substance Use Topics  . Alcohol use: No    Review of Systems  DATA OBTAINED: from patient GENERAL:  no fevers, fatigue, appetite changes SKIN: No itching, rash HEENT: No complaint RESPIRATORY: No cough, wheezing, SOB CARDIAC: No chest pain, palpitations, lower extremity edema  GI: No abdominal pain, No N/V/D or constipation, No heartburn or reflux  GU: No dysuria, frequency or urgency, or incontinence  MUSCULOSKELETAL: No unrelieved bone/joint pain NEUROLOGIC: No headache, dizziness  PSYCHIATRIC: No overt anxiety or sadness  Vitals:   02/06/18 1105  BP: (!) 101/58  Pulse: 70  Resp: 18  Temp: (!) 97 F (36.1 C)  SpO2: 95%   Body mass index is 30.19 kg/m. Physical Exam  GENERAL APPEARANCE: Alert, conversant, No acute distress  SKIN: No diaphoresis rash HEENT: Unremarkable RESPIRATORY: Breathing is even, unlabored. Lung sounds are clear   CARDIOVASCULAR: Heart RRR no murmurs, rubs or gallops. No peripheral edema  GASTROINTESTINAL: Abdomen is soft, non-tender, not distended w/ normal bowel sounds.  GENITOURINARY: Bladder non tender, not distended; Foley to straight drain MUSCULOSKELETAL: No abnormal joints or musculature NEUROLOGIC: Cranial nerves 2-12 grossly intact. Moves all extremities PSYCHIATRIC: Mood and affect appropriate to situation, no behavioral issues  Patient Active Problem List   Diagnosis Date Noted  . 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 Vitamin D deficiency 12/18/2017  . Urinary tract infection due to Proteus 09/29/2017  . Fall 09/29/2017  . Acute lateral meniscus tear of left knee 09/29/2017  . Acute medial meniscus tear of left knee 09/29/2017  . Vitamin B12 deficiency 09/29/2017  . Pressure injury of skin 09/22/2017  . Acute kidney injury ([place]) 09/21/2017   . Catheter-associated urinary tract infection ([place]) 09/21/2017  . Elevated CK 09/21/2017  . Rhabdomyolysis 09/21/2017  . Staghorn calculus 06/30/2016  . Atonic neurogenic bladder 06/30/2016  . Urinary retention 08/15/2014  . Staphylococcus aureus bacteremia 06/30/2014  . Generalized weakness 06/23/2014  . Chronic diastolic congestive heart failure ([place]) 06/23/2014  . HTN (hypertension) 06/23/2014  . Prostate cancer ([place]) 06/23/2014  . Presence of suprapubic catheter ([place]) 06/17/2014  . Cervical myelopathy ([place])   . Arthritis   . Urinary incontinence, functional 10/25/2012  . Bilateral leg weakness 10/25/2012  . Fall at home 10/25/2012  . Lower urinary tract infectious disease 10/25/2012  . Sleep apnea 10/25/2012  . Medically noncompliant 10/25/2012  . Obesity 10/25/2012    CMP     Component Value Date/Time   NA 138 11/05/2017   K 3.9 11/05/2017   CL 97 (L) 09/22/2017 0434   CO2 26 09/22/2017 0434   GLUCOSE 121 (H) 09/22/2017 0434   BUN 16 11/05/2017   CREATININE 1.3 11/05/2017   CREATININE 1.15 09/22/2017  0434   CALCIUM 8.7 (L) 09/22/2017 0434   PROT 7.4 09/21/2017 0743   ALBUMIN 2.7 (L) 09/21/2017 0743   AST 39 09/21/2017 0743   ALT 19 09/21/2017 0743   ALKPHOS 70 09/21/2017 0743   BILITOT 1.1 09/21/2017 0743   GFRNONAA >60 09/22/2017 0434   GFRAA >60 09/22/2017 0434   Recent Labs    09/17/17 1600 09/21/17 0629 09/22/17 0434 11/05/17  NA 137 134* 134* 138  K 3.6 3.8 3.7 3.9  CL 98* 94* 97*  --   CO2 30 27 26   --   GLUCOSE 121* 124* 121*  --   BUN 17 23* 20 16  CREATININE 1.13 1.35* 1.15 1.3  CALCIUM 9.0 9.0 8.7*  --    Recent Labs    09/21/17 0743  AST 39  ALT 19  ALKPHOS 70  BILITOT 1.1  PROT 7.4  ALBUMIN 2.7*   Recent Labs    09/21/17 0743 09/22/17 0434 09/24/17 1441 09/25/17 0529 11/05/17  WBC  --  17.2* 9.8 11.2* 10.7  NEUTROABS 15.8*  --  6.9 7.9*  --   HGB  --  11.2* 11.5* 11.5* 11.8*  HCT  --  34.8* 35.6* 35.8* 36*  MCV  --  87.7  88.3 88.8  --   PLT  --  259 295 313 377   No results for input(s): CHOL, LDLCALC, TRIG in the last 8760 hours.  Invalid input(s): HCL No results found for: MICROALBUR Lab Results  Component Value Date   TSH 2.500 06/10/2014   Lab Results  Component Value Date   HGBA1C (H) 05/13/2007    6.3 (NOTE)   The ADA recommends the following therapeutic goals for glycemic   control related to Hgb A1C measurement:   Goal of Therapy:   < 7.0% Hgb A1C   Action Suggested:  > 8.0% Hgb A1C   Ref:  Diabetes Care, 22, Suppl. 1, 1999   No results found for: CHOL, HDL, LDLCALC, LDLDIRECT, TRIG, CHOLHDL  Significant Diagnostic Results in last 30 days:  No results found.  Assessment and Plan  Xanthogranulomatous pyelonephritis Patient has planned for early May left nephrectomy and possible repair of right kidney secondary to recurrent infections and bacteremias  Vitamin D deficiency Stable; continue 2000 units daily  HTN (hypertension) Controlled, on Lasix 20 mg daily; continue Lasix 20 mg daily    Nhia Heaphy D. Sheppard Coil, MD

## 2018-02-18 DIAGNOSIS — M2550 Pain in unspecified joint: Secondary | ICD-10-CM | POA: Diagnosis not present

## 2018-02-18 DIAGNOSIS — Z452 Encounter for adjustment and management of vascular access device: Secondary | ICD-10-CM | POA: Diagnosis not present

## 2018-02-18 DIAGNOSIS — B964 Proteus (mirabilis) (morganii) as the cause of diseases classified elsewhere: Secondary | ICD-10-CM | POA: Diagnosis present

## 2018-02-18 DIAGNOSIS — N151 Renal and perinephric abscess: Secondary | ICD-10-CM | POA: Diagnosis not present

## 2018-02-18 DIAGNOSIS — B955 Unspecified streptococcus as the cause of diseases classified elsewhere: Secondary | ICD-10-CM | POA: Diagnosis not present

## 2018-02-18 DIAGNOSIS — Z905 Acquired absence of kidney: Secondary | ICD-10-CM | POA: Diagnosis not present

## 2018-02-18 DIAGNOSIS — B954 Other streptococcus as the cause of diseases classified elsewhere: Secondary | ICD-10-CM | POA: Diagnosis present

## 2018-02-18 DIAGNOSIS — D72829 Elevated white blood cell count, unspecified: Secondary | ICD-10-CM | POA: Diagnosis not present

## 2018-02-18 DIAGNOSIS — K56609 Unspecified intestinal obstruction, unspecified as to partial versus complete obstruction: Secondary | ICD-10-CM | POA: Diagnosis not present

## 2018-02-18 DIAGNOSIS — K668 Other specified disorders of peritoneum: Secondary | ICD-10-CM | POA: Diagnosis not present

## 2018-02-18 DIAGNOSIS — Z960 Presence of urogenital implants: Secondary | ICD-10-CM | POA: Diagnosis not present

## 2018-02-18 DIAGNOSIS — R066 Hiccough: Secondary | ICD-10-CM | POA: Diagnosis not present

## 2018-02-18 DIAGNOSIS — N319 Neuromuscular dysfunction of bladder, unspecified: Secondary | ICD-10-CM | POA: Diagnosis present

## 2018-02-18 DIAGNOSIS — N210 Calculus in bladder: Secondary | ICD-10-CM | POA: Diagnosis present

## 2018-02-18 DIAGNOSIS — K219 Gastro-esophageal reflux disease without esophagitis: Secondary | ICD-10-CM | POA: Diagnosis present

## 2018-02-18 DIAGNOSIS — R5082 Postprocedural fever: Secondary | ICD-10-CM | POA: Diagnosis not present

## 2018-02-18 DIAGNOSIS — K439 Ventral hernia without obstruction or gangrene: Secondary | ICD-10-CM | POA: Diagnosis present

## 2018-02-18 DIAGNOSIS — T8143XA Infection following a procedure, organ and space surgical site, initial encounter: Secondary | ICD-10-CM | POA: Diagnosis not present

## 2018-02-18 DIAGNOSIS — J9811 Atelectasis: Secondary | ICD-10-CM | POA: Diagnosis not present

## 2018-02-18 DIAGNOSIS — B029 Zoster without complications: Secondary | ICD-10-CM | POA: Diagnosis not present

## 2018-02-18 DIAGNOSIS — K8020 Calculus of gallbladder without cholecystitis without obstruction: Secondary | ICD-10-CM | POA: Diagnosis not present

## 2018-02-18 DIAGNOSIS — N118 Other chronic tubulo-interstitial nephritis: Secondary | ICD-10-CM | POA: Diagnosis present

## 2018-02-18 DIAGNOSIS — I493 Ventricular premature depolarization: Secondary | ICD-10-CM | POA: Diagnosis not present

## 2018-02-18 DIAGNOSIS — N200 Calculus of kidney: Secondary | ICD-10-CM | POA: Diagnosis present

## 2018-02-18 DIAGNOSIS — R9431 Abnormal electrocardiogram [ECG] [EKG]: Secondary | ICD-10-CM | POA: Diagnosis not present

## 2018-02-18 DIAGNOSIS — D500 Iron deficiency anemia secondary to blood loss (chronic): Secondary | ICD-10-CM | POA: Diagnosis present

## 2018-02-18 DIAGNOSIS — K56699 Other intestinal obstruction unspecified as to partial versus complete obstruction: Secondary | ICD-10-CM | POA: Diagnosis not present

## 2018-02-18 DIAGNOSIS — D763 Other histiocytosis syndromes: Secondary | ICD-10-CM | POA: Diagnosis present

## 2018-02-18 DIAGNOSIS — K420 Umbilical hernia with obstruction, without gangrene: Secondary | ICD-10-CM | POA: Diagnosis not present

## 2018-02-18 DIAGNOSIS — E876 Hypokalemia: Secondary | ICD-10-CM | POA: Diagnosis not present

## 2018-02-18 DIAGNOSIS — B9689 Other specified bacterial agents as the cause of diseases classified elsewhere: Secondary | ICD-10-CM | POA: Diagnosis present

## 2018-02-18 DIAGNOSIS — K567 Ileus, unspecified: Secondary | ICD-10-CM | POA: Diagnosis not present

## 2018-02-18 DIAGNOSIS — Z4659 Encounter for fitting and adjustment of other gastrointestinal appliance and device: Secondary | ICD-10-CM | POA: Diagnosis not present

## 2018-02-18 DIAGNOSIS — K6819 Other retroperitoneal abscess: Secondary | ICD-10-CM | POA: Diagnosis not present

## 2018-02-18 DIAGNOSIS — E46 Unspecified protein-calorie malnutrition: Secondary | ICD-10-CM | POA: Diagnosis present

## 2018-02-18 DIAGNOSIS — G8918 Other acute postprocedural pain: Secondary | ICD-10-CM | POA: Diagnosis not present

## 2018-02-18 DIAGNOSIS — Z87440 Personal history of urinary (tract) infections: Secondary | ICD-10-CM | POA: Diagnosis not present

## 2018-02-18 DIAGNOSIS — Z7401 Bed confinement status: Secondary | ICD-10-CM | POA: Diagnosis not present

## 2018-02-18 DIAGNOSIS — N390 Urinary tract infection, site not specified: Secondary | ICD-10-CM | POA: Diagnosis present

## 2018-02-18 DIAGNOSIS — Z4682 Encounter for fitting and adjustment of non-vascular catheter: Secondary | ICD-10-CM | POA: Diagnosis not present

## 2018-02-25 ENCOUNTER — Encounter: Payer: Self-pay | Admitting: Internal Medicine

## 2018-02-25 DIAGNOSIS — N118 Other chronic tubulo-interstitial nephritis: Secondary | ICD-10-CM | POA: Insufficient documentation

## 2018-02-25 MED ORDER — NALOXONE HCL 0.4 MG/ML IJ SOLN
0.40 | INTRAMUSCULAR | Status: DC
Start: ? — End: 2018-02-25

## 2018-02-25 MED ORDER — GENERIC EXTERNAL MEDICATION
25.00 | Status: DC
Start: ? — End: 2018-02-25

## 2018-02-25 MED ORDER — ACETAMINOPHEN 325 MG PO TABS
650.00 | ORAL_TABLET | ORAL | Status: DC
Start: ? — End: 2018-02-25

## 2018-02-25 MED ORDER — KCL IN DEXTROSE-NACL 20-5-0.45 MEQ/L-%-% IV SOLN
INTRAVENOUS | Status: DC
Start: ? — End: 2018-02-25

## 2018-02-25 MED ORDER — ZOLPIDEM TARTRATE 5 MG PO TABS
5.00 | ORAL_TABLET | ORAL | Status: DC
Start: ? — End: 2018-02-25

## 2018-02-25 MED ORDER — OXYCODONE-ACETAMINOPHEN 5-325 MG PO TABS
1.00 | ORAL_TABLET | ORAL | Status: DC
Start: ? — End: 2018-02-25

## 2018-02-25 MED ORDER — BISACODYL 10 MG RE SUPP
10.00 | RECTAL | Status: DC
Start: ? — End: 2018-02-25

## 2018-02-25 MED ORDER — GENERIC EXTERNAL MEDICATION
3.38 g | Status: DC
Start: 2018-02-26 — End: 2018-02-25

## 2018-02-25 MED ORDER — FAMOTIDINE 20 MG/2ML IV SOLN
20.00 | INTRAVENOUS | Status: DC
Start: 2018-02-26 — End: 2018-02-25

## 2018-02-25 MED ORDER — ALUMINUM-MAGNESIUM-SIMETHICONE 200-200-20 MG/5ML PO SUSP
30.00 | ORAL | Status: DC
Start: ? — End: 2018-02-25

## 2018-02-25 MED ORDER — ONDANSETRON HCL 4 MG/2ML IJ SOLN
4.00 | INTRAMUSCULAR | Status: DC
Start: ? — End: 2018-02-25

## 2018-02-25 MED ORDER — PHENOL 1.4 % MT LIQD
1.00 | OROMUCOSAL | Status: DC
Start: ? — End: 2018-02-25

## 2018-02-25 MED ORDER — GENERIC EXTERNAL MEDICATION
5.00 | Status: DC
Start: 2018-02-26 — End: 2018-02-25

## 2018-02-25 MED ORDER — HYDROMORPHONE HCL 1 MG/ML IJ SOLN
0.50 | INTRAMUSCULAR | Status: DC
Start: ? — End: 2018-02-25

## 2018-02-25 MED ORDER — KETOROLAC TROMETHAMINE 15 MG/ML IJ SOLN
15.00 | INTRAMUSCULAR | Status: DC
Start: ? — End: 2018-02-25

## 2018-02-25 NOTE — Assessment & Plan Note (Signed)
Patient has planned for early May left nephrectomy and possible repair of right kidney secondary to recurrent infections and bacteremias

## 2018-02-25 NOTE — Assessment & Plan Note (Signed)
Stable; continue 2000 units daily

## 2018-02-25 NOTE — Assessment & Plan Note (Signed)
Controlled, on Lasix 20 mg daily; continue Lasix 20 mg daily

## 2018-02-28 DIAGNOSIS — Z452 Encounter for adjustment and management of vascular access device: Secondary | ICD-10-CM | POA: Diagnosis not present

## 2018-03-02 DIAGNOSIS — N1339 Other hydronephrosis: Secondary | ICD-10-CM | POA: Diagnosis not present

## 2018-03-02 DIAGNOSIS — K5900 Constipation, unspecified: Secondary | ICD-10-CM | POA: Diagnosis not present

## 2018-03-02 DIAGNOSIS — N390 Urinary tract infection, site not specified: Secondary | ICD-10-CM | POA: Diagnosis not present

## 2018-03-02 DIAGNOSIS — Z111 Encounter for screening for respiratory tuberculosis: Secondary | ICD-10-CM | POA: Diagnosis not present

## 2018-03-02 DIAGNOSIS — Z4801 Encounter for change or removal of surgical wound dressing: Secondary | ICD-10-CM | POA: Diagnosis not present

## 2018-03-02 DIAGNOSIS — N10 Acute tubulo-interstitial nephritis: Secondary | ICD-10-CM | POA: Diagnosis not present

## 2018-03-02 DIAGNOSIS — N200 Calculus of kidney: Secondary | ICD-10-CM | POA: Diagnosis not present

## 2018-03-02 DIAGNOSIS — N312 Flaccid neuropathic bladder, not elsewhere classified: Secondary | ICD-10-CM | POA: Diagnosis not present

## 2018-03-02 DIAGNOSIS — E569 Vitamin deficiency, unspecified: Secondary | ICD-10-CM | POA: Diagnosis not present

## 2018-03-02 DIAGNOSIS — R52 Pain, unspecified: Secondary | ICD-10-CM | POA: Diagnosis not present

## 2018-03-02 DIAGNOSIS — B029 Zoster without complications: Secondary | ICD-10-CM | POA: Diagnosis not present

## 2018-03-04 DIAGNOSIS — M6281 Muscle weakness (generalized): Secondary | ICD-10-CM | POA: Diagnosis not present

## 2018-03-04 DIAGNOSIS — N1339 Other hydronephrosis: Secondary | ICD-10-CM | POA: Diagnosis not present

## 2018-03-04 DIAGNOSIS — R52 Pain, unspecified: Secondary | ICD-10-CM | POA: Diagnosis not present

## 2018-03-04 DIAGNOSIS — N10 Acute tubulo-interstitial nephritis: Secondary | ICD-10-CM | POA: Diagnosis not present

## 2018-03-04 DIAGNOSIS — K5900 Constipation, unspecified: Secondary | ICD-10-CM | POA: Diagnosis not present

## 2018-03-04 DIAGNOSIS — N390 Urinary tract infection, site not specified: Secondary | ICD-10-CM | POA: Diagnosis not present

## 2018-03-04 DIAGNOSIS — E569 Vitamin deficiency, unspecified: Secondary | ICD-10-CM | POA: Diagnosis not present

## 2018-03-04 DIAGNOSIS — B029 Zoster without complications: Secondary | ICD-10-CM | POA: Diagnosis not present

## 2018-03-04 DIAGNOSIS — N200 Calculus of kidney: Secondary | ICD-10-CM | POA: Diagnosis not present

## 2018-03-04 DIAGNOSIS — Z4801 Encounter for change or removal of surgical wound dressing: Secondary | ICD-10-CM | POA: Diagnosis not present

## 2018-03-04 DIAGNOSIS — N319 Neuromuscular dysfunction of bladder, unspecified: Secondary | ICD-10-CM | POA: Diagnosis not present

## 2018-03-04 DIAGNOSIS — R600 Localized edema: Secondary | ICD-10-CM | POA: Diagnosis not present

## 2018-03-04 DIAGNOSIS — N312 Flaccid neuropathic bladder, not elsewhere classified: Secondary | ICD-10-CM | POA: Diagnosis not present

## 2018-03-05 DIAGNOSIS — N111 Chronic obstructive pyelonephritis: Secondary | ICD-10-CM | POA: Diagnosis not present

## 2018-03-05 DIAGNOSIS — Z48816 Encounter for surgical aftercare following surgery on the genitourinary system: Secondary | ICD-10-CM | POA: Diagnosis not present

## 2018-03-05 DIAGNOSIS — K5900 Constipation, unspecified: Secondary | ICD-10-CM | POA: Diagnosis not present

## 2018-03-05 DIAGNOSIS — E569 Vitamin deficiency, unspecified: Secondary | ICD-10-CM | POA: Diagnosis not present

## 2018-03-05 DIAGNOSIS — R52 Pain, unspecified: Secondary | ICD-10-CM | POA: Diagnosis not present

## 2018-03-05 DIAGNOSIS — N1339 Other hydronephrosis: Secondary | ICD-10-CM | POA: Diagnosis not present

## 2018-03-05 DIAGNOSIS — N319 Neuromuscular dysfunction of bladder, unspecified: Secondary | ICD-10-CM | POA: Diagnosis not present

## 2018-03-05 DIAGNOSIS — Z4801 Encounter for change or removal of surgical wound dressing: Secondary | ICD-10-CM | POA: Diagnosis not present

## 2018-03-05 DIAGNOSIS — N312 Flaccid neuropathic bladder, not elsewhere classified: Secondary | ICD-10-CM | POA: Diagnosis not present

## 2018-03-05 DIAGNOSIS — M6281 Muscle weakness (generalized): Secondary | ICD-10-CM | POA: Diagnosis not present

## 2018-03-05 DIAGNOSIS — B029 Zoster without complications: Secondary | ICD-10-CM | POA: Diagnosis not present

## 2018-03-05 DIAGNOSIS — N390 Urinary tract infection, site not specified: Secondary | ICD-10-CM | POA: Diagnosis not present

## 2018-03-05 DIAGNOSIS — R600 Localized edema: Secondary | ICD-10-CM | POA: Diagnosis not present

## 2018-03-05 DIAGNOSIS — N200 Calculus of kidney: Secondary | ICD-10-CM | POA: Diagnosis not present

## 2018-03-06 DIAGNOSIS — N1339 Other hydronephrosis: Secondary | ICD-10-CM | POA: Diagnosis not present

## 2018-03-06 DIAGNOSIS — N200 Calculus of kidney: Secondary | ICD-10-CM | POA: Diagnosis not present

## 2018-03-06 DIAGNOSIS — R52 Pain, unspecified: Secondary | ICD-10-CM | POA: Diagnosis not present

## 2018-03-06 DIAGNOSIS — N312 Flaccid neuropathic bladder, not elsewhere classified: Secondary | ICD-10-CM | POA: Diagnosis not present

## 2018-03-06 DIAGNOSIS — N390 Urinary tract infection, site not specified: Secondary | ICD-10-CM | POA: Diagnosis not present

## 2018-03-06 DIAGNOSIS — Z4801 Encounter for change or removal of surgical wound dressing: Secondary | ICD-10-CM | POA: Diagnosis not present

## 2018-03-07 DIAGNOSIS — M6281 Muscle weakness (generalized): Secondary | ICD-10-CM | POA: Diagnosis not present

## 2018-03-07 DIAGNOSIS — N200 Calculus of kidney: Secondary | ICD-10-CM | POA: Diagnosis not present

## 2018-03-07 DIAGNOSIS — N312 Flaccid neuropathic bladder, not elsewhere classified: Secondary | ICD-10-CM | POA: Diagnosis not present

## 2018-03-07 DIAGNOSIS — N319 Neuromuscular dysfunction of bladder, unspecified: Secondary | ICD-10-CM | POA: Diagnosis not present

## 2018-03-07 DIAGNOSIS — D649 Anemia, unspecified: Secondary | ICD-10-CM | POA: Diagnosis not present

## 2018-03-07 DIAGNOSIS — N1339 Other hydronephrosis: Secondary | ICD-10-CM | POA: Diagnosis not present

## 2018-03-07 DIAGNOSIS — Z4801 Encounter for change or removal of surgical wound dressing: Secondary | ICD-10-CM | POA: Diagnosis not present

## 2018-03-07 DIAGNOSIS — N390 Urinary tract infection, site not specified: Secondary | ICD-10-CM | POA: Diagnosis not present

## 2018-03-07 DIAGNOSIS — N111 Chronic obstructive pyelonephritis: Secondary | ICD-10-CM | POA: Diagnosis not present

## 2018-03-07 DIAGNOSIS — K5900 Constipation, unspecified: Secondary | ICD-10-CM | POA: Diagnosis not present

## 2018-03-07 DIAGNOSIS — R52 Pain, unspecified: Secondary | ICD-10-CM | POA: Diagnosis not present

## 2018-03-07 DIAGNOSIS — R600 Localized edema: Secondary | ICD-10-CM | POA: Diagnosis not present

## 2018-03-07 DIAGNOSIS — E569 Vitamin deficiency, unspecified: Secondary | ICD-10-CM | POA: Diagnosis not present

## 2018-03-07 DIAGNOSIS — Z48816 Encounter for surgical aftercare following surgery on the genitourinary system: Secondary | ICD-10-CM | POA: Diagnosis not present

## 2018-03-07 DIAGNOSIS — B029 Zoster without complications: Secondary | ICD-10-CM | POA: Diagnosis not present

## 2018-03-08 DIAGNOSIS — Z4801 Encounter for change or removal of surgical wound dressing: Secondary | ICD-10-CM | POA: Diagnosis not present

## 2018-03-08 DIAGNOSIS — N312 Flaccid neuropathic bladder, not elsewhere classified: Secondary | ICD-10-CM | POA: Diagnosis not present

## 2018-03-08 DIAGNOSIS — R52 Pain, unspecified: Secondary | ICD-10-CM | POA: Diagnosis not present

## 2018-03-08 DIAGNOSIS — N200 Calculus of kidney: Secondary | ICD-10-CM | POA: Diagnosis not present

## 2018-03-08 DIAGNOSIS — N390 Urinary tract infection, site not specified: Secondary | ICD-10-CM | POA: Diagnosis not present

## 2018-03-08 DIAGNOSIS — N1339 Other hydronephrosis: Secondary | ICD-10-CM | POA: Diagnosis not present

## 2018-03-11 DIAGNOSIS — N1339 Other hydronephrosis: Secondary | ICD-10-CM | POA: Diagnosis not present

## 2018-03-11 DIAGNOSIS — Z4801 Encounter for change or removal of surgical wound dressing: Secondary | ICD-10-CM | POA: Diagnosis not present

## 2018-03-11 DIAGNOSIS — N200 Calculus of kidney: Secondary | ICD-10-CM | POA: Diagnosis not present

## 2018-03-11 DIAGNOSIS — R52 Pain, unspecified: Secondary | ICD-10-CM | POA: Diagnosis not present

## 2018-03-11 DIAGNOSIS — N390 Urinary tract infection, site not specified: Secondary | ICD-10-CM | POA: Diagnosis not present

## 2018-03-11 DIAGNOSIS — N312 Flaccid neuropathic bladder, not elsewhere classified: Secondary | ICD-10-CM | POA: Diagnosis not present

## 2018-03-12 DIAGNOSIS — D649 Anemia, unspecified: Secondary | ICD-10-CM | POA: Diagnosis not present

## 2018-03-12 DIAGNOSIS — K5900 Constipation, unspecified: Secondary | ICD-10-CM | POA: Diagnosis not present

## 2018-03-12 DIAGNOSIS — N312 Flaccid neuropathic bladder, not elsewhere classified: Secondary | ICD-10-CM | POA: Diagnosis not present

## 2018-03-12 DIAGNOSIS — N200 Calculus of kidney: Secondary | ICD-10-CM | POA: Diagnosis not present

## 2018-03-12 DIAGNOSIS — N1339 Other hydronephrosis: Secondary | ICD-10-CM | POA: Diagnosis not present

## 2018-03-12 DIAGNOSIS — E569 Vitamin deficiency, unspecified: Secondary | ICD-10-CM | POA: Diagnosis not present

## 2018-03-12 DIAGNOSIS — Z48816 Encounter for surgical aftercare following surgery on the genitourinary system: Secondary | ICD-10-CM | POA: Diagnosis not present

## 2018-03-12 DIAGNOSIS — B029 Zoster without complications: Secondary | ICD-10-CM | POA: Diagnosis not present

## 2018-03-12 DIAGNOSIS — R600 Localized edema: Secondary | ICD-10-CM | POA: Diagnosis not present

## 2018-03-12 DIAGNOSIS — N111 Chronic obstructive pyelonephritis: Secondary | ICD-10-CM | POA: Diagnosis not present

## 2018-03-12 DIAGNOSIS — R52 Pain, unspecified: Secondary | ICD-10-CM | POA: Diagnosis not present

## 2018-03-12 DIAGNOSIS — M6281 Muscle weakness (generalized): Secondary | ICD-10-CM | POA: Diagnosis not present

## 2018-03-12 DIAGNOSIS — N319 Neuromuscular dysfunction of bladder, unspecified: Secondary | ICD-10-CM | POA: Diagnosis not present

## 2018-03-12 DIAGNOSIS — N390 Urinary tract infection, site not specified: Secondary | ICD-10-CM | POA: Diagnosis not present

## 2018-03-12 DIAGNOSIS — Z4801 Encounter for change or removal of surgical wound dressing: Secondary | ICD-10-CM | POA: Diagnosis not present

## 2018-03-13 DIAGNOSIS — Z4801 Encounter for change or removal of surgical wound dressing: Secondary | ICD-10-CM | POA: Diagnosis not present

## 2018-03-13 DIAGNOSIS — N390 Urinary tract infection, site not specified: Secondary | ICD-10-CM | POA: Diagnosis not present

## 2018-03-13 DIAGNOSIS — N1339 Other hydronephrosis: Secondary | ICD-10-CM | POA: Diagnosis not present

## 2018-03-13 DIAGNOSIS — N200 Calculus of kidney: Secondary | ICD-10-CM | POA: Diagnosis not present

## 2018-03-13 DIAGNOSIS — N312 Flaccid neuropathic bladder, not elsewhere classified: Secondary | ICD-10-CM | POA: Diagnosis not present

## 2018-03-13 DIAGNOSIS — R52 Pain, unspecified: Secondary | ICD-10-CM | POA: Diagnosis not present

## 2018-03-14 DIAGNOSIS — N312 Flaccid neuropathic bladder, not elsewhere classified: Secondary | ICD-10-CM | POA: Diagnosis not present

## 2018-03-14 DIAGNOSIS — F4310 Post-traumatic stress disorder, unspecified: Secondary | ICD-10-CM | POA: Diagnosis not present

## 2018-03-14 DIAGNOSIS — N1339 Other hydronephrosis: Secondary | ICD-10-CM | POA: Diagnosis not present

## 2018-03-14 DIAGNOSIS — N390 Urinary tract infection, site not specified: Secondary | ICD-10-CM | POA: Diagnosis not present

## 2018-03-14 DIAGNOSIS — Z4801 Encounter for change or removal of surgical wound dressing: Secondary | ICD-10-CM | POA: Diagnosis not present

## 2018-03-14 DIAGNOSIS — N200 Calculus of kidney: Secondary | ICD-10-CM | POA: Diagnosis not present

## 2018-03-14 DIAGNOSIS — R52 Pain, unspecified: Secondary | ICD-10-CM | POA: Diagnosis not present

## 2018-03-15 DIAGNOSIS — N390 Urinary tract infection, site not specified: Secondary | ICD-10-CM | POA: Diagnosis not present

## 2018-03-15 DIAGNOSIS — N1339 Other hydronephrosis: Secondary | ICD-10-CM | POA: Diagnosis not present

## 2018-03-15 DIAGNOSIS — N200 Calculus of kidney: Secondary | ICD-10-CM | POA: Diagnosis not present

## 2018-03-15 DIAGNOSIS — R52 Pain, unspecified: Secondary | ICD-10-CM | POA: Diagnosis not present

## 2018-03-15 DIAGNOSIS — N312 Flaccid neuropathic bladder, not elsewhere classified: Secondary | ICD-10-CM | POA: Diagnosis not present

## 2018-03-15 DIAGNOSIS — Z4801 Encounter for change or removal of surgical wound dressing: Secondary | ICD-10-CM | POA: Diagnosis not present

## 2018-03-17 DIAGNOSIS — M6281 Muscle weakness (generalized): Secondary | ICD-10-CM | POA: Diagnosis not present

## 2018-03-17 DIAGNOSIS — N10 Acute tubulo-interstitial nephritis: Secondary | ICD-10-CM | POA: Diagnosis not present

## 2018-03-18 DIAGNOSIS — M6281 Muscle weakness (generalized): Secondary | ICD-10-CM | POA: Diagnosis not present

## 2018-03-18 DIAGNOSIS — N10 Acute tubulo-interstitial nephritis: Secondary | ICD-10-CM | POA: Diagnosis not present

## 2018-03-19 DIAGNOSIS — Z48816 Encounter for surgical aftercare following surgery on the genitourinary system: Secondary | ICD-10-CM | POA: Diagnosis not present

## 2018-03-19 DIAGNOSIS — D649 Anemia, unspecified: Secondary | ICD-10-CM | POA: Diagnosis not present

## 2018-03-19 DIAGNOSIS — E569 Vitamin deficiency, unspecified: Secondary | ICD-10-CM | POA: Diagnosis not present

## 2018-03-19 DIAGNOSIS — B029 Zoster without complications: Secondary | ICD-10-CM | POA: Diagnosis not present

## 2018-03-19 DIAGNOSIS — M6281 Muscle weakness (generalized): Secondary | ICD-10-CM | POA: Diagnosis not present

## 2018-03-19 DIAGNOSIS — R600 Localized edema: Secondary | ICD-10-CM | POA: Diagnosis not present

## 2018-03-19 DIAGNOSIS — N111 Chronic obstructive pyelonephritis: Secondary | ICD-10-CM | POA: Diagnosis not present

## 2018-03-19 DIAGNOSIS — K5900 Constipation, unspecified: Secondary | ICD-10-CM | POA: Diagnosis not present

## 2018-03-19 DIAGNOSIS — N319 Neuromuscular dysfunction of bladder, unspecified: Secondary | ICD-10-CM | POA: Diagnosis not present

## 2018-03-19 DIAGNOSIS — R52 Pain, unspecified: Secondary | ICD-10-CM | POA: Diagnosis not present

## 2018-03-19 DIAGNOSIS — N10 Acute tubulo-interstitial nephritis: Secondary | ICD-10-CM | POA: Diagnosis not present

## 2018-03-20 DIAGNOSIS — M6281 Muscle weakness (generalized): Secondary | ICD-10-CM | POA: Diagnosis not present

## 2018-03-20 DIAGNOSIS — N10 Acute tubulo-interstitial nephritis: Secondary | ICD-10-CM | POA: Diagnosis not present

## 2018-03-21 DIAGNOSIS — N10 Acute tubulo-interstitial nephritis: Secondary | ICD-10-CM | POA: Diagnosis not present

## 2018-03-21 DIAGNOSIS — M6281 Muscle weakness (generalized): Secondary | ICD-10-CM | POA: Diagnosis not present

## 2018-03-22 DIAGNOSIS — M6281 Muscle weakness (generalized): Secondary | ICD-10-CM | POA: Diagnosis not present

## 2018-03-22 DIAGNOSIS — N10 Acute tubulo-interstitial nephritis: Secondary | ICD-10-CM | POA: Diagnosis not present

## 2018-03-25 DIAGNOSIS — M6281 Muscle weakness (generalized): Secondary | ICD-10-CM | POA: Diagnosis not present

## 2018-03-25 DIAGNOSIS — N10 Acute tubulo-interstitial nephritis: Secondary | ICD-10-CM | POA: Diagnosis not present

## 2018-03-26 DIAGNOSIS — M6281 Muscle weakness (generalized): Secondary | ICD-10-CM | POA: Diagnosis not present

## 2018-03-26 DIAGNOSIS — N10 Acute tubulo-interstitial nephritis: Secondary | ICD-10-CM | POA: Diagnosis not present

## 2018-03-27 DIAGNOSIS — M6281 Muscle weakness (generalized): Secondary | ICD-10-CM | POA: Diagnosis not present

## 2018-03-27 DIAGNOSIS — N10 Acute tubulo-interstitial nephritis: Secondary | ICD-10-CM | POA: Diagnosis not present

## 2018-03-28 DIAGNOSIS — M6281 Muscle weakness (generalized): Secondary | ICD-10-CM | POA: Diagnosis not present

## 2018-03-28 DIAGNOSIS — N10 Acute tubulo-interstitial nephritis: Secondary | ICD-10-CM | POA: Diagnosis not present

## 2018-03-29 DIAGNOSIS — R8290 Unspecified abnormal findings in urine: Secondary | ICD-10-CM | POA: Diagnosis not present

## 2018-03-29 DIAGNOSIS — N200 Calculus of kidney: Secondary | ICD-10-CM | POA: Diagnosis not present

## 2018-03-29 DIAGNOSIS — N10 Acute tubulo-interstitial nephritis: Secondary | ICD-10-CM | POA: Diagnosis not present

## 2018-03-29 DIAGNOSIS — M6281 Muscle weakness (generalized): Secondary | ICD-10-CM | POA: Diagnosis not present

## 2018-03-29 DIAGNOSIS — N319 Neuromuscular dysfunction of bladder, unspecified: Secondary | ICD-10-CM | POA: Diagnosis not present

## 2018-03-29 DIAGNOSIS — K8020 Calculus of gallbladder without cholecystitis without obstruction: Secondary | ICD-10-CM | POA: Diagnosis not present

## 2018-03-31 DIAGNOSIS — M6281 Muscle weakness (generalized): Secondary | ICD-10-CM | POA: Diagnosis not present

## 2018-03-31 DIAGNOSIS — N10 Acute tubulo-interstitial nephritis: Secondary | ICD-10-CM | POA: Diagnosis not present

## 2018-04-01 DIAGNOSIS — N10 Acute tubulo-interstitial nephritis: Secondary | ICD-10-CM | POA: Diagnosis not present

## 2018-04-01 DIAGNOSIS — M6281 Muscle weakness (generalized): Secondary | ICD-10-CM | POA: Diagnosis not present

## 2018-04-02 DIAGNOSIS — M6281 Muscle weakness (generalized): Secondary | ICD-10-CM | POA: Diagnosis not present

## 2018-04-02 DIAGNOSIS — N200 Calculus of kidney: Secondary | ICD-10-CM | POA: Diagnosis not present

## 2018-04-02 DIAGNOSIS — N10 Acute tubulo-interstitial nephritis: Secondary | ICD-10-CM | POA: Diagnosis not present

## 2018-04-02 DIAGNOSIS — G8929 Other chronic pain: Secondary | ICD-10-CM | POA: Diagnosis not present

## 2018-04-02 DIAGNOSIS — E569 Vitamin deficiency, unspecified: Secondary | ICD-10-CM | POA: Diagnosis not present

## 2018-04-03 DIAGNOSIS — M6281 Muscle weakness (generalized): Secondary | ICD-10-CM | POA: Diagnosis not present

## 2018-04-03 DIAGNOSIS — N10 Acute tubulo-interstitial nephritis: Secondary | ICD-10-CM | POA: Diagnosis not present

## 2018-04-04 DIAGNOSIS — N10 Acute tubulo-interstitial nephritis: Secondary | ICD-10-CM | POA: Diagnosis not present

## 2018-04-04 DIAGNOSIS — E559 Vitamin D deficiency, unspecified: Secondary | ICD-10-CM | POA: Diagnosis not present

## 2018-04-04 DIAGNOSIS — M6281 Muscle weakness (generalized): Secondary | ICD-10-CM | POA: Diagnosis not present

## 2018-04-04 DIAGNOSIS — R600 Localized edema: Secondary | ICD-10-CM | POA: Diagnosis not present

## 2018-04-04 DIAGNOSIS — K5900 Constipation, unspecified: Secondary | ICD-10-CM | POA: Diagnosis not present

## 2018-04-04 DIAGNOSIS — R2689 Other abnormalities of gait and mobility: Secondary | ICD-10-CM | POA: Diagnosis not present

## 2018-04-04 DIAGNOSIS — Z741 Need for assistance with personal care: Secondary | ICD-10-CM | POA: Diagnosis not present

## 2018-04-04 DIAGNOSIS — N139 Obstructive and reflux uropathy, unspecified: Secondary | ICD-10-CM | POA: Diagnosis not present

## 2018-04-04 DIAGNOSIS — D649 Anemia, unspecified: Secondary | ICD-10-CM | POA: Diagnosis not present

## 2018-04-04 DIAGNOSIS — Z905 Acquired absence of kidney: Secondary | ICD-10-CM | POA: Diagnosis not present

## 2018-04-04 DIAGNOSIS — R52 Pain, unspecified: Secondary | ICD-10-CM | POA: Diagnosis not present

## 2018-04-04 DIAGNOSIS — K219 Gastro-esophageal reflux disease without esophagitis: Secondary | ICD-10-CM | POA: Diagnosis not present

## 2018-04-05 DIAGNOSIS — M6281 Muscle weakness (generalized): Secondary | ICD-10-CM | POA: Diagnosis not present

## 2018-04-05 DIAGNOSIS — N10 Acute tubulo-interstitial nephritis: Secondary | ICD-10-CM | POA: Diagnosis not present

## 2018-04-08 DIAGNOSIS — N10 Acute tubulo-interstitial nephritis: Secondary | ICD-10-CM | POA: Diagnosis not present

## 2018-04-08 DIAGNOSIS — M6281 Muscle weakness (generalized): Secondary | ICD-10-CM | POA: Diagnosis not present

## 2018-04-09 DIAGNOSIS — K5900 Constipation, unspecified: Secondary | ICD-10-CM | POA: Diagnosis not present

## 2018-04-09 DIAGNOSIS — Z48816 Encounter for surgical aftercare following surgery on the genitourinary system: Secondary | ICD-10-CM | POA: Diagnosis not present

## 2018-04-09 DIAGNOSIS — D649 Anemia, unspecified: Secondary | ICD-10-CM | POA: Diagnosis not present

## 2018-04-09 DIAGNOSIS — R2689 Other abnormalities of gait and mobility: Secondary | ICD-10-CM | POA: Diagnosis not present

## 2018-04-09 DIAGNOSIS — N111 Chronic obstructive pyelonephritis: Secondary | ICD-10-CM | POA: Diagnosis not present

## 2018-04-09 DIAGNOSIS — R600 Localized edema: Secondary | ICD-10-CM | POA: Diagnosis not present

## 2018-04-09 DIAGNOSIS — R52 Pain, unspecified: Secondary | ICD-10-CM | POA: Diagnosis not present

## 2018-04-09 DIAGNOSIS — M6281 Muscle weakness (generalized): Secondary | ICD-10-CM | POA: Diagnosis not present

## 2018-04-09 DIAGNOSIS — K219 Gastro-esophageal reflux disease without esophagitis: Secondary | ICD-10-CM | POA: Diagnosis not present

## 2018-04-09 DIAGNOSIS — N10 Acute tubulo-interstitial nephritis: Secondary | ICD-10-CM | POA: Diagnosis not present

## 2018-04-09 DIAGNOSIS — Z905 Acquired absence of kidney: Secondary | ICD-10-CM | POA: Diagnosis not present

## 2018-04-09 DIAGNOSIS — Z741 Need for assistance with personal care: Secondary | ICD-10-CM | POA: Diagnosis not present

## 2018-04-09 DIAGNOSIS — N139 Obstructive and reflux uropathy, unspecified: Secondary | ICD-10-CM | POA: Diagnosis not present

## 2018-04-10 DIAGNOSIS — N10 Acute tubulo-interstitial nephritis: Secondary | ICD-10-CM | POA: Diagnosis not present

## 2018-04-10 DIAGNOSIS — M6281 Muscle weakness (generalized): Secondary | ICD-10-CM | POA: Diagnosis not present

## 2018-04-11 DIAGNOSIS — N10 Acute tubulo-interstitial nephritis: Secondary | ICD-10-CM | POA: Diagnosis not present

## 2018-04-11 DIAGNOSIS — M6281 Muscle weakness (generalized): Secondary | ICD-10-CM | POA: Diagnosis not present

## 2018-04-12 DIAGNOSIS — M6281 Muscle weakness (generalized): Secondary | ICD-10-CM | POA: Diagnosis not present

## 2018-04-12 DIAGNOSIS — N10 Acute tubulo-interstitial nephritis: Secondary | ICD-10-CM | POA: Diagnosis not present

## 2018-04-15 DIAGNOSIS — M6281 Muscle weakness (generalized): Secondary | ICD-10-CM | POA: Diagnosis not present

## 2018-04-15 DIAGNOSIS — N10 Acute tubulo-interstitial nephritis: Secondary | ICD-10-CM | POA: Diagnosis not present

## 2018-04-16 DIAGNOSIS — M6281 Muscle weakness (generalized): Secondary | ICD-10-CM | POA: Diagnosis not present

## 2018-04-16 DIAGNOSIS — N10 Acute tubulo-interstitial nephritis: Secondary | ICD-10-CM | POA: Diagnosis not present

## 2018-04-17 DIAGNOSIS — F4312 Post-traumatic stress disorder, chronic: Secondary | ICD-10-CM | POA: Diagnosis not present

## 2018-04-17 DIAGNOSIS — F4320 Adjustment disorder, unspecified: Secondary | ICD-10-CM | POA: Diagnosis not present

## 2018-04-17 DIAGNOSIS — N10 Acute tubulo-interstitial nephritis: Secondary | ICD-10-CM | POA: Diagnosis not present

## 2018-04-17 DIAGNOSIS — M6281 Muscle weakness (generalized): Secondary | ICD-10-CM | POA: Diagnosis not present

## 2018-04-18 DIAGNOSIS — R399 Unspecified symptoms and signs involving the genitourinary system: Secondary | ICD-10-CM | POA: Diagnosis not present

## 2018-04-18 DIAGNOSIS — R062 Wheezing: Secondary | ICD-10-CM | POA: Diagnosis not present

## 2018-04-18 DIAGNOSIS — N10 Acute tubulo-interstitial nephritis: Secondary | ICD-10-CM | POA: Diagnosis not present

## 2018-04-18 DIAGNOSIS — R05 Cough: Secondary | ICD-10-CM | POA: Diagnosis not present

## 2018-04-18 DIAGNOSIS — R0989 Other specified symptoms and signs involving the circulatory and respiratory systems: Secondary | ICD-10-CM | POA: Diagnosis not present

## 2018-04-18 DIAGNOSIS — M6281 Muscle weakness (generalized): Secondary | ICD-10-CM | POA: Diagnosis not present

## 2018-04-19 DIAGNOSIS — N390 Urinary tract infection, site not specified: Secondary | ICD-10-CM | POA: Diagnosis not present

## 2018-04-19 DIAGNOSIS — N10 Acute tubulo-interstitial nephritis: Secondary | ICD-10-CM | POA: Diagnosis not present

## 2018-04-19 DIAGNOSIS — M6281 Muscle weakness (generalized): Secondary | ICD-10-CM | POA: Diagnosis not present

## 2018-04-19 DIAGNOSIS — D649 Anemia, unspecified: Secondary | ICD-10-CM | POA: Diagnosis not present

## 2018-04-20 DIAGNOSIS — M6281 Muscle weakness (generalized): Secondary | ICD-10-CM | POA: Diagnosis not present

## 2018-04-20 DIAGNOSIS — N10 Acute tubulo-interstitial nephritis: Secondary | ICD-10-CM | POA: Diagnosis not present

## 2018-04-21 DIAGNOSIS — M6281 Muscle weakness (generalized): Secondary | ICD-10-CM | POA: Diagnosis not present

## 2018-04-21 DIAGNOSIS — N10 Acute tubulo-interstitial nephritis: Secondary | ICD-10-CM | POA: Diagnosis not present

## 2018-04-22 DIAGNOSIS — N200 Calculus of kidney: Secondary | ICD-10-CM | POA: Diagnosis not present

## 2018-04-23 DIAGNOSIS — M6281 Muscle weakness (generalized): Secondary | ICD-10-CM | POA: Diagnosis not present

## 2018-04-23 DIAGNOSIS — N10 Acute tubulo-interstitial nephritis: Secondary | ICD-10-CM | POA: Diagnosis not present

## 2018-04-24 DIAGNOSIS — N390 Urinary tract infection, site not specified: Secondary | ICD-10-CM | POA: Diagnosis not present

## 2018-04-24 DIAGNOSIS — N10 Acute tubulo-interstitial nephritis: Secondary | ICD-10-CM | POA: Diagnosis not present

## 2018-04-24 DIAGNOSIS — L603 Nail dystrophy: Secondary | ICD-10-CM | POA: Diagnosis not present

## 2018-04-24 DIAGNOSIS — Q845 Enlarged and hypertrophic nails: Secondary | ICD-10-CM | POA: Diagnosis not present

## 2018-04-24 DIAGNOSIS — B351 Tinea unguium: Secondary | ICD-10-CM | POA: Diagnosis not present

## 2018-04-24 DIAGNOSIS — I739 Peripheral vascular disease, unspecified: Secondary | ICD-10-CM | POA: Diagnosis not present

## 2018-04-24 DIAGNOSIS — M6281 Muscle weakness (generalized): Secondary | ICD-10-CM | POA: Diagnosis not present

## 2018-04-25 DIAGNOSIS — N10 Acute tubulo-interstitial nephritis: Secondary | ICD-10-CM | POA: Diagnosis not present

## 2018-04-25 DIAGNOSIS — M6281 Muscle weakness (generalized): Secondary | ICD-10-CM | POA: Diagnosis not present

## 2018-04-28 DIAGNOSIS — N10 Acute tubulo-interstitial nephritis: Secondary | ICD-10-CM | POA: Diagnosis not present

## 2018-04-28 DIAGNOSIS — M6281 Muscle weakness (generalized): Secondary | ICD-10-CM | POA: Diagnosis not present

## 2018-04-29 DIAGNOSIS — N10 Acute tubulo-interstitial nephritis: Secondary | ICD-10-CM | POA: Diagnosis not present

## 2018-04-29 DIAGNOSIS — M6281 Muscle weakness (generalized): Secondary | ICD-10-CM | POA: Diagnosis not present

## 2018-04-30 DIAGNOSIS — M6281 Muscle weakness (generalized): Secondary | ICD-10-CM | POA: Diagnosis not present

## 2018-04-30 DIAGNOSIS — N10 Acute tubulo-interstitial nephritis: Secondary | ICD-10-CM | POA: Diagnosis not present

## 2018-05-01 DIAGNOSIS — N10 Acute tubulo-interstitial nephritis: Secondary | ICD-10-CM | POA: Diagnosis not present

## 2018-05-01 DIAGNOSIS — M6281 Muscle weakness (generalized): Secondary | ICD-10-CM | POA: Diagnosis not present

## 2018-05-02 DIAGNOSIS — M6281 Muscle weakness (generalized): Secondary | ICD-10-CM | POA: Diagnosis not present

## 2018-05-02 DIAGNOSIS — N10 Acute tubulo-interstitial nephritis: Secondary | ICD-10-CM | POA: Diagnosis not present

## 2018-05-03 DIAGNOSIS — N200 Calculus of kidney: Secondary | ICD-10-CM | POA: Diagnosis not present

## 2018-05-05 DIAGNOSIS — N10 Acute tubulo-interstitial nephritis: Secondary | ICD-10-CM | POA: Diagnosis not present

## 2018-05-05 DIAGNOSIS — M6281 Muscle weakness (generalized): Secondary | ICD-10-CM | POA: Diagnosis not present

## 2018-05-08 DIAGNOSIS — N10 Acute tubulo-interstitial nephritis: Secondary | ICD-10-CM | POA: Diagnosis not present

## 2018-05-08 DIAGNOSIS — M6281 Muscle weakness (generalized): Secondary | ICD-10-CM | POA: Diagnosis not present

## 2018-05-14 DIAGNOSIS — K219 Gastro-esophageal reflux disease without esophagitis: Secondary | ICD-10-CM | POA: Diagnosis not present

## 2018-05-14 DIAGNOSIS — R52 Pain, unspecified: Secondary | ICD-10-CM | POA: Diagnosis not present

## 2018-05-14 DIAGNOSIS — N202 Calculus of kidney with calculus of ureter: Secondary | ICD-10-CM | POA: Diagnosis not present

## 2018-05-14 DIAGNOSIS — N319 Neuromuscular dysfunction of bladder, unspecified: Secondary | ICD-10-CM | POA: Diagnosis not present

## 2018-05-14 DIAGNOSIS — B029 Zoster without complications: Secondary | ICD-10-CM | POA: Diagnosis not present

## 2018-05-14 DIAGNOSIS — R600 Localized edema: Secondary | ICD-10-CM | POA: Diagnosis not present

## 2018-05-14 DIAGNOSIS — K5900 Constipation, unspecified: Secondary | ICD-10-CM | POA: Diagnosis not present

## 2018-05-14 DIAGNOSIS — Z48816 Encounter for surgical aftercare following surgery on the genitourinary system: Secondary | ICD-10-CM | POA: Diagnosis not present

## 2018-05-14 DIAGNOSIS — E559 Vitamin D deficiency, unspecified: Secondary | ICD-10-CM | POA: Diagnosis not present

## 2018-05-14 DIAGNOSIS — D649 Anemia, unspecified: Secondary | ICD-10-CM | POA: Diagnosis not present

## 2018-05-14 DIAGNOSIS — M6281 Muscle weakness (generalized): Secondary | ICD-10-CM | POA: Diagnosis not present

## 2018-05-15 DIAGNOSIS — M6281 Muscle weakness (generalized): Secondary | ICD-10-CM | POA: Diagnosis not present

## 2018-05-15 DIAGNOSIS — K219 Gastro-esophageal reflux disease without esophagitis: Secondary | ICD-10-CM | POA: Diagnosis not present

## 2018-05-15 DIAGNOSIS — K5900 Constipation, unspecified: Secondary | ICD-10-CM | POA: Diagnosis not present

## 2018-05-15 DIAGNOSIS — E559 Vitamin D deficiency, unspecified: Secondary | ICD-10-CM | POA: Diagnosis not present

## 2018-05-15 DIAGNOSIS — Z48816 Encounter for surgical aftercare following surgery on the genitourinary system: Secondary | ICD-10-CM | POA: Diagnosis not present

## 2018-05-15 DIAGNOSIS — B029 Zoster without complications: Secondary | ICD-10-CM | POA: Diagnosis not present

## 2018-05-15 DIAGNOSIS — N202 Calculus of kidney with calculus of ureter: Secondary | ICD-10-CM | POA: Diagnosis not present

## 2018-05-15 DIAGNOSIS — D649 Anemia, unspecified: Secondary | ICD-10-CM | POA: Diagnosis not present

## 2018-05-15 DIAGNOSIS — N319 Neuromuscular dysfunction of bladder, unspecified: Secondary | ICD-10-CM | POA: Diagnosis not present

## 2018-05-22 DIAGNOSIS — F4320 Adjustment disorder, unspecified: Secondary | ICD-10-CM | POA: Diagnosis not present

## 2018-05-22 DIAGNOSIS — F4312 Post-traumatic stress disorder, chronic: Secondary | ICD-10-CM | POA: Diagnosis not present

## 2018-06-27 DIAGNOSIS — M6281 Muscle weakness (generalized): Secondary | ICD-10-CM | POA: Diagnosis not present

## 2018-06-27 DIAGNOSIS — N1339 Other hydronephrosis: Secondary | ICD-10-CM | POA: Diagnosis not present

## 2018-07-05 DIAGNOSIS — K5900 Constipation, unspecified: Secondary | ICD-10-CM | POA: Diagnosis not present

## 2018-07-05 DIAGNOSIS — D649 Anemia, unspecified: Secondary | ICD-10-CM | POA: Diagnosis not present

## 2018-07-05 DIAGNOSIS — N202 Calculus of kidney with calculus of ureter: Secondary | ICD-10-CM | POA: Diagnosis not present

## 2018-07-05 DIAGNOSIS — N319 Neuromuscular dysfunction of bladder, unspecified: Secondary | ICD-10-CM | POA: Diagnosis not present

## 2018-07-05 DIAGNOSIS — Z48816 Encounter for surgical aftercare following surgery on the genitourinary system: Secondary | ICD-10-CM | POA: Diagnosis not present

## 2018-07-05 DIAGNOSIS — E559 Vitamin D deficiency, unspecified: Secondary | ICD-10-CM | POA: Diagnosis not present

## 2018-07-05 DIAGNOSIS — K219 Gastro-esophageal reflux disease without esophagitis: Secondary | ICD-10-CM | POA: Diagnosis not present

## 2018-07-05 DIAGNOSIS — M6281 Muscle weakness (generalized): Secondary | ICD-10-CM | POA: Diagnosis not present

## 2018-07-05 DIAGNOSIS — B029 Zoster without complications: Secondary | ICD-10-CM | POA: Diagnosis not present

## 2018-07-10 DIAGNOSIS — F4312 Post-traumatic stress disorder, chronic: Secondary | ICD-10-CM | POA: Diagnosis not present

## 2018-07-10 DIAGNOSIS — F4323 Adjustment disorder with mixed anxiety and depressed mood: Secondary | ICD-10-CM | POA: Diagnosis not present

## 2018-08-02 ENCOUNTER — Inpatient Hospital Stay (HOSPITAL_COMMUNITY)
Admission: EM | Admit: 2018-08-02 | Discharge: 2018-08-21 | DRG: 871 | Disposition: A | Discharge status: Skilled Nursing Facility | Payer: Medicare Other | Attending: Student in an Organized Health Care Education/Training Program | Admitting: Student in an Organized Health Care Education/Training Program

## 2018-08-02 ENCOUNTER — Encounter (HOSPITAL_COMMUNITY): Payer: Self-pay

## 2018-08-02 ENCOUNTER — Other Ambulatory Visit: Payer: Self-pay

## 2018-08-02 ENCOUNTER — Emergency Department (HOSPITAL_COMMUNITY): Payer: Medicare Other

## 2018-08-02 DIAGNOSIS — Z87442 Personal history of urinary calculi: Secondary | ICD-10-CM

## 2018-08-02 DIAGNOSIS — L8990 Pressure ulcer of unspecified site, unspecified stage: Secondary | ICD-10-CM | POA: Diagnosis not present

## 2018-08-02 DIAGNOSIS — B965 Pseudomonas (aeruginosa) (mallei) (pseudomallei) as the cause of diseases classified elsewhere: Secondary | ICD-10-CM | POA: Diagnosis present

## 2018-08-02 DIAGNOSIS — M19042 Primary osteoarthritis, left hand: Secondary | ICD-10-CM | POA: Diagnosis present

## 2018-08-02 DIAGNOSIS — Z111 Encounter for screening for respiratory tuberculosis: Secondary | ICD-10-CM | POA: Diagnosis not present

## 2018-08-02 DIAGNOSIS — E440 Moderate protein-calorie malnutrition: Secondary | ICD-10-CM | POA: Diagnosis present

## 2018-08-02 DIAGNOSIS — S31010A Laceration without foreign body of lower back and pelvis without penetration into retroperitoneum, initial encounter: Secondary | ICD-10-CM | POA: Diagnosis present

## 2018-08-02 DIAGNOSIS — R6520 Severe sepsis without septic shock: Secondary | ICD-10-CM

## 2018-08-02 DIAGNOSIS — R310 Gross hematuria: Secondary | ICD-10-CM | POA: Diagnosis present

## 2018-08-02 DIAGNOSIS — B9561 Methicillin susceptible Staphylococcus aureus infection as the cause of diseases classified elsewhere: Secondary | ICD-10-CM | POA: Diagnosis present

## 2018-08-02 DIAGNOSIS — L89301 Pressure ulcer of unspecified buttock, stage 1: Secondary | ICD-10-CM | POA: Diagnosis present

## 2018-08-02 DIAGNOSIS — N183 Chronic kidney disease, stage 3 (moderate): Secondary | ICD-10-CM | POA: Diagnosis present

## 2018-08-02 DIAGNOSIS — R682 Dry mouth, unspecified: Secondary | ICD-10-CM | POA: Diagnosis not present

## 2018-08-02 DIAGNOSIS — E876 Hypokalemia: Secondary | ICD-10-CM | POA: Diagnosis present

## 2018-08-02 DIAGNOSIS — R319 Hematuria, unspecified: Secondary | ICD-10-CM | POA: Diagnosis present

## 2018-08-02 DIAGNOSIS — K6811 Postprocedural retroperitoneal abscess: Secondary | ICD-10-CM | POA: Diagnosis not present

## 2018-08-02 DIAGNOSIS — Z452 Encounter for adjustment and management of vascular access device: Secondary | ICD-10-CM

## 2018-08-02 DIAGNOSIS — K651 Peritoneal abscess: Secondary | ICD-10-CM

## 2018-08-02 DIAGNOSIS — G4730 Sleep apnea, unspecified: Secondary | ICD-10-CM | POA: Diagnosis present

## 2018-08-02 DIAGNOSIS — F4310 Post-traumatic stress disorder, unspecified: Secondary | ICD-10-CM | POA: Diagnosis present

## 2018-08-02 DIAGNOSIS — T83122A Displacement of urinary stent, initial encounter: Secondary | ICD-10-CM

## 2018-08-02 DIAGNOSIS — M6281 Muscle weakness (generalized): Secondary | ICD-10-CM | POA: Diagnosis not present

## 2018-08-02 DIAGNOSIS — M170 Bilateral primary osteoarthritis of knee: Secondary | ICD-10-CM | POA: Diagnosis present

## 2018-08-02 DIAGNOSIS — D649 Anemia, unspecified: Secondary | ICD-10-CM | POA: Diagnosis present

## 2018-08-02 DIAGNOSIS — Z87440 Personal history of urinary (tract) infections: Secondary | ICD-10-CM

## 2018-08-02 DIAGNOSIS — A419 Sepsis, unspecified organism: Principal | ICD-10-CM

## 2018-08-02 DIAGNOSIS — Z515 Encounter for palliative care: Secondary | ICD-10-CM | POA: Diagnosis not present

## 2018-08-02 DIAGNOSIS — L0291 Cutaneous abscess, unspecified: Secondary | ICD-10-CM

## 2018-08-02 DIAGNOSIS — B958 Unspecified staphylococcus as the cause of diseases classified elsewhere: Secondary | ICD-10-CM | POA: Diagnosis not present

## 2018-08-02 DIAGNOSIS — B954 Other streptococcus as the cause of diseases classified elsewhere: Secondary | ICD-10-CM | POA: Diagnosis present

## 2018-08-02 DIAGNOSIS — E871 Hypo-osmolality and hyponatremia: Secondary | ICD-10-CM | POA: Diagnosis present

## 2018-08-02 DIAGNOSIS — X58XXXA Exposure to other specified factors, initial encounter: Secondary | ICD-10-CM | POA: Diagnosis present

## 2018-08-02 DIAGNOSIS — E43 Unspecified severe protein-calorie malnutrition: Secondary | ICD-10-CM | POA: Diagnosis present

## 2018-08-02 DIAGNOSIS — J986 Disorders of diaphragm: Secondary | ICD-10-CM | POA: Diagnosis not present

## 2018-08-02 DIAGNOSIS — R32 Unspecified urinary incontinence: Secondary | ICD-10-CM | POA: Diagnosis present

## 2018-08-02 DIAGNOSIS — N200 Calculus of kidney: Secondary | ICD-10-CM | POA: Diagnosis present

## 2018-08-02 DIAGNOSIS — E441 Mild protein-calorie malnutrition: Secondary | ICD-10-CM | POA: Diagnosis not present

## 2018-08-02 DIAGNOSIS — N390 Urinary tract infection, site not specified: Secondary | ICD-10-CM | POA: Diagnosis present

## 2018-08-02 DIAGNOSIS — E669 Obesity, unspecified: Secondary | ICD-10-CM | POA: Diagnosis present

## 2018-08-02 DIAGNOSIS — G959 Disease of spinal cord, unspecified: Secondary | ICD-10-CM | POA: Diagnosis not present

## 2018-08-02 DIAGNOSIS — I5030 Unspecified diastolic (congestive) heart failure: Secondary | ICD-10-CM | POA: Diagnosis not present

## 2018-08-02 DIAGNOSIS — N118 Other chronic tubulo-interstitial nephritis: Secondary | ICD-10-CM | POA: Diagnosis not present

## 2018-08-02 DIAGNOSIS — K861 Other chronic pancreatitis: Secondary | ICD-10-CM | POA: Diagnosis not present

## 2018-08-02 DIAGNOSIS — R1084 Generalized abdominal pain: Secondary | ICD-10-CM | POA: Diagnosis not present

## 2018-08-02 DIAGNOSIS — N189 Chronic kidney disease, unspecified: Secondary | ICD-10-CM | POA: Diagnosis not present

## 2018-08-02 DIAGNOSIS — N132 Hydronephrosis with renal and ureteral calculous obstruction: Secondary | ICD-10-CM | POA: Diagnosis not present

## 2018-08-02 DIAGNOSIS — Z960 Presence of urogenital implants: Secondary | ICD-10-CM | POA: Diagnosis not present

## 2018-08-02 DIAGNOSIS — N179 Acute kidney failure, unspecified: Secondary | ICD-10-CM | POA: Diagnosis not present

## 2018-08-02 DIAGNOSIS — R5381 Other malaise: Secondary | ICD-10-CM | POA: Diagnosis not present

## 2018-08-02 DIAGNOSIS — I5032 Chronic diastolic (congestive) heart failure: Secondary | ICD-10-CM | POA: Diagnosis present

## 2018-08-02 DIAGNOSIS — K5900 Constipation, unspecified: Secondary | ICD-10-CM | POA: Diagnosis not present

## 2018-08-02 DIAGNOSIS — B961 Klebsiella pneumoniae [K. pneumoniae] as the cause of diseases classified elsewhere: Secondary | ICD-10-CM | POA: Diagnosis present

## 2018-08-02 DIAGNOSIS — R52 Pain, unspecified: Secondary | ICD-10-CM | POA: Diagnosis not present

## 2018-08-02 DIAGNOSIS — M2550 Pain in unspecified joint: Secondary | ICD-10-CM | POA: Diagnosis not present

## 2018-08-02 DIAGNOSIS — N319 Neuromuscular dysfunction of bladder, unspecified: Secondary | ICD-10-CM | POA: Diagnosis present

## 2018-08-02 DIAGNOSIS — B379 Candidiasis, unspecified: Secondary | ICD-10-CM | POA: Diagnosis not present

## 2018-08-02 DIAGNOSIS — Z7189 Other specified counseling: Secondary | ICD-10-CM | POA: Diagnosis not present

## 2018-08-02 DIAGNOSIS — Z6830 Body mass index (BMI) 30.0-30.9, adult: Secondary | ICD-10-CM

## 2018-08-02 DIAGNOSIS — B952 Enterococcus as the cause of diseases classified elsewhere: Secondary | ICD-10-CM | POA: Diagnosis present

## 2018-08-02 DIAGNOSIS — Z8613 Personal history of malaria: Secondary | ICD-10-CM

## 2018-08-02 DIAGNOSIS — K6819 Other retroperitoneal abscess: Secondary | ICD-10-CM | POA: Diagnosis not present

## 2018-08-02 DIAGNOSIS — R8271 Bacteriuria: Secondary | ICD-10-CM | POA: Diagnosis not present

## 2018-08-02 DIAGNOSIS — Z7401 Bed confinement status: Secondary | ICD-10-CM

## 2018-08-02 DIAGNOSIS — Z1621 Resistance to vancomycin: Secondary | ICD-10-CM | POA: Diagnosis present

## 2018-08-02 DIAGNOSIS — Z905 Acquired absence of kidney: Secondary | ICD-10-CM | POA: Diagnosis not present

## 2018-08-02 DIAGNOSIS — K828 Other specified diseases of gallbladder: Secondary | ICD-10-CM | POA: Diagnosis not present

## 2018-08-02 DIAGNOSIS — M479 Spondylosis, unspecified: Secondary | ICD-10-CM | POA: Diagnosis present

## 2018-08-02 DIAGNOSIS — K6812 Psoas muscle abscess: Secondary | ICD-10-CM | POA: Diagnosis not present

## 2018-08-02 DIAGNOSIS — Z8546 Personal history of malignant neoplasm of prostate: Secondary | ICD-10-CM

## 2018-08-02 DIAGNOSIS — M19041 Primary osteoarthritis, right hand: Secondary | ICD-10-CM | POA: Diagnosis present

## 2018-08-02 DIAGNOSIS — Z978 Presence of other specified devices: Secondary | ICD-10-CM | POA: Diagnosis not present

## 2018-08-02 DIAGNOSIS — E039 Hypothyroidism, unspecified: Secondary | ICD-10-CM | POA: Diagnosis not present

## 2018-08-02 DIAGNOSIS — L02211 Cutaneous abscess of abdominal wall: Secondary | ICD-10-CM | POA: Diagnosis not present

## 2018-08-02 DIAGNOSIS — E46 Unspecified protein-calorie malnutrition: Secondary | ICD-10-CM | POA: Diagnosis not present

## 2018-08-02 DIAGNOSIS — Q600 Renal agenesis, unilateral: Secondary | ICD-10-CM | POA: Diagnosis not present

## 2018-08-02 DIAGNOSIS — B964 Proteus (mirabilis) (morganii) as the cause of diseases classified elsewhere: Secondary | ICD-10-CM | POA: Diagnosis not present

## 2018-08-02 DIAGNOSIS — Z79899 Other long term (current) drug therapy: Secondary | ICD-10-CM

## 2018-08-02 LAB — CBC WITH DIFFERENTIAL/PLATELET
Basophils Absolute: 0 10*3/uL (ref 0.0–0.1)
Basophils Relative: 0 %
Eosinophils Absolute: 0 10*3/uL (ref 0.0–0.5)
Eosinophils Relative: 0 %
HCT: 31.6 % — ABNORMAL LOW (ref 39.0–52.0)
Hemoglobin: 9.3 g/dL — ABNORMAL LOW (ref 13.0–17.0)
Lymphocytes Relative: 4 %
Lymphs Abs: 1.3 10*3/uL (ref 0.7–4.0)
MCH: 25.8 pg — ABNORMAL LOW (ref 26.0–34.0)
MCHC: 29.4 g/dL — ABNORMAL LOW (ref 30.0–36.0)
MCV: 87.5 fL (ref 80.0–100.0)
Monocytes Absolute: 2 10*3/uL — ABNORMAL HIGH (ref 0.1–1.0)
Monocytes Relative: 6 %
Myelocytes: 1 %
Neutro Abs: 29.8 10*3/uL — ABNORMAL HIGH (ref 1.7–7.7)
Neutrophils Relative %: 89 %
Platelets: 401 10*3/uL — ABNORMAL HIGH (ref 150–400)
RBC: 3.61 MIL/uL — ABNORMAL LOW (ref 4.22–5.81)
RDW: 15.2 % (ref 11.5–15.5)
WBC: 33.1 10*3/uL — ABNORMAL HIGH (ref 4.0–10.5)
nRBC: 0 % (ref 0.0–0.2)
nRBC: 0 /100 WBC

## 2018-08-02 LAB — COMPREHENSIVE METABOLIC PANEL
ALK PHOS: 71 U/L (ref 38–126)
ALT: 15 U/L (ref 0–44)
AST: 20 U/L (ref 15–41)
Albumin: 1.9 g/dL — ABNORMAL LOW (ref 3.5–5.0)
Anion gap: 12 (ref 5–15)
BILIRUBIN TOTAL: 0.5 mg/dL (ref 0.3–1.2)
BUN: 35 mg/dL — ABNORMAL HIGH (ref 8–23)
CHLORIDE: 101 mmol/L (ref 98–111)
CO2: 20 mmol/L — ABNORMAL LOW (ref 22–32)
Calcium: 10.6 mg/dL — ABNORMAL HIGH (ref 8.9–10.3)
Creatinine, Ser: 2.35 mg/dL — ABNORMAL HIGH (ref 0.61–1.24)
GFR calc Af Amer: 30 mL/min — ABNORMAL LOW (ref >60)
GFR, EST NON AFRICAN AMERICAN: 26 mL/min — AB (ref >60)
Glucose, Bld: 155 mg/dL — ABNORMAL HIGH (ref 70–99)
Potassium: 3.5 mmol/L (ref 3.5–5.1)
Sodium: 133 mmol/L — ABNORMAL LOW (ref 135–145)
Total Protein: 7.1 g/dL (ref 6.5–8.1)

## 2018-08-02 LAB — URINALYSIS, ROUTINE W REFLEX MICROSCOPIC
Bilirubin Urine: NEGATIVE
GLUCOSE, UA: NEGATIVE mg/dL
Ketones, ur: NEGATIVE mg/dL
Nitrite: NEGATIVE
PH: 6 (ref 5.0–8.0)
Protein, ur: 100 mg/dL — AB
RBC / HPF: >50 RBC/hpf — ABNORMAL HIGH (ref 0–5)
SPECIFIC GRAVITY, URINE: 1.015 (ref 1.005–1.030)
WBC, UA: >50 WBC/hpf — ABNORMAL HIGH (ref 0–5)

## 2018-08-02 LAB — I-STAT CG4 LACTIC ACID, ED
LACTIC ACID, VENOUS: 2.14 mmol/L — AB (ref 0.5–1.9)
LACTIC ACID, VENOUS: 1.83 mmol/L (ref 0.5–1.9)

## 2018-08-02 MED ORDER — ACETAMINOPHEN 650 MG RE SUPP: 650.0000 mg | Freq: Four times a day (QID) | RECTAL | Status: DC | PRN

## 2018-08-02 MED ORDER — ENOXAPARIN SODIUM 40 MG/0.4ML ~~LOC~~ SOLN
40.0000 mg | SUBCUTANEOUS | Status: DC
  Administered 2018-08-03: 40 mg via SUBCUTANEOUS
  Filled 2018-08-02: qty 0.4

## 2018-08-02 MED ORDER — SENNOSIDES-DOCUSATE SODIUM 8.6-50 MG PO TABS: 1.0000 | ORAL_TABLET | Freq: Every evening | ORAL | Status: DC | PRN

## 2018-08-02 MED ORDER — ACETAMINOPHEN 325 MG PO TABS
650.0000 mg | ORAL_TABLET | Freq: Four times a day (QID) | ORAL | Status: DC | PRN
  Administered 2018-08-02 – 2018-08-06 (×4): 650 mg via ORAL
  Filled 2018-08-02 (×4): qty 2

## 2018-08-02 MED ORDER — METRONIDAZOLE IN NACL 5-0.79 MG/ML-% IV SOLN
500.0000 mg | Freq: Three times a day (TID) | INTRAVENOUS | Status: DC
  Administered 2018-08-02 – 2018-08-07 (×15): 500 mg via INTRAVENOUS
  Filled 2018-08-02 (×15): qty 100

## 2018-08-02 MED ORDER — SODIUM CHLORIDE 0.9 % IV SOLN
2.0000 g | Freq: Once | INTRAVENOUS | Status: AC
  Administered 2018-08-02: 2 g via INTRAVENOUS
  Filled 2018-08-02: qty 2

## 2018-08-02 MED ORDER — SODIUM CHLORIDE 0.9 % IV BOLUS
500.0000 mL | Freq: Once | INTRAVENOUS | Status: AC
Start: 2018-08-02 — End: 2018-08-02
  Administered 2018-08-02: 500 mL via INTRAVENOUS

## 2018-08-02 MED ORDER — SODIUM CHLORIDE 0.9 % IV BOLUS
2500.0000 mL | Freq: Once | INTRAVENOUS | Status: AC
  Administered 2018-08-02: 2500 mL via INTRAVENOUS

## 2018-08-02 MED ORDER — PROMETHAZINE HCL 25 MG PO TABS: 12.5000 mg | ORAL_TABLET | Freq: Four times a day (QID) | ORAL | Status: DC | PRN

## 2018-08-02 MED ORDER — SODIUM CHLORIDE 0.9 % IV SOLN: 2.0000 g | INTRAVENOUS | Status: DC

## 2018-08-02 NOTE — ED Notes (Signed)
Re-paged IM resident; no return call from previous page

## 2018-08-02 NOTE — ED Triage Notes (Signed)
Pt from [facility] with diagnosis of retroperitoneal abscess.  Complaining of back pain at this time.  A%Ox4.  Bed bound with a foley from outside facility.

## 2018-08-02 NOTE — ED Provider Notes (Signed)
[facility] EMERGENCY DEPARTMENT Provider Note   CSN: [id number] Arrival date & time: 08/02/18  1820     History   Chief Complaint Chief Complaint  Patient presents with  . Abscess  . Back Pain    HPI Jerry [name] is a 72 y.o. male.  HPI   The patient is here for evaluation of suspected left psoas region, abscess.  Patient had imaging done today at the [facility] hospital, in [facility].  He was sent here from his nursing care facility after results returned.  Is somewhat unclear exactly why he had the testing done today however he describes not feeling well, and having decreased appetite for 5 days.  [place] course in the last year, with "lots of antibiotics," left nephrectomy, ureteral stents for kidney stones.  The patient is here with his wife, who gives some history but is quite anxious and upset regarding the current status of her husband.  The patient is calm and interactive, and his appreciation for the care which is being initiated.  There are no other no modifying factors.   Past Medical History:  Diagnosis Date  . Acute kidney injury ([place]) 09/21/2017  . Arthritis    "hands, knees, back, legs" (06/10/2014)  . Atonic neurogenic bladder 06/30/2016  . Bilateral leg weakness 10/25/2012  . Cervical myelopathy ([place])   . Chronic diastolic congestive heart failure ([place]) 06/23/2014  . Coma ([place]) 1968   "for 2 wks"  . Fall at home 10/25/2012  . Generalized weakness 06/23/2014  . IOMBTDHR(416.3)    "monthly" (06/10/2014)  . Lower urinary tract infectious disease 10/25/2012  . Malaria   . Obesity 10/25/2012  . Presence of suprapubic catheter ([place]) 06/17/2014   Due to prostate cancer   . Prostate cancer ([place])   . PTSD (post-traumatic stress disorder)    "I'm all over that now"  . Rhabdomyolysis 09/21/2017  . Sleep apnea 10/25/2012  . Staghorn calculus 06/30/2016  . Staphylococcus aureus bacteremia 06/30/2014  . Urinary incontinence, functional 10/25/2012  .  UTI (urinary tract infection) 09/2017    Patient Active Problem List   Diagnosis Date Noted  . 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 Vitamin D deficiency 12/18/2017  . Urinary tract infection due to Proteus 09/29/2017  . Fall 09/29/2017  . Acute lateral meniscus tear of left knee 09/29/2017  . Acute medial meniscus tear of left knee 09/29/2017  . Vitamin B12 deficiency 09/29/2017  . Pressure injury of skin 09/22/2017  . Acute kidney injury ([place]) 09/21/2017  . Catheter-associated urinary tract infection ([place]) 09/21/2017  . Elevated CK 09/21/2017  . Rhabdomyolysis 09/21/2017  . Staghorn calculus 06/30/2016  . Atonic neurogenic bladder 06/30/2016  . Urinary retention 08/15/2014  . Staphylococcus aureus bacteremia 06/30/2014  . Generalized weakness 06/23/2014  . Chronic diastolic congestive heart failure ([place]) 06/23/2014  . HTN (hypertension) 06/23/2014  . Prostate cancer ([place]) 06/23/2014  . Presence of suprapubic catheter ([place]) 06/17/2014  . Cervical myelopathy ([place])   . Arthritis   . Urinary incontinence, functional 10/25/2012  . Bilateral leg weakness 10/25/2012  . Fall at home 10/25/2012  . Lower urinary tract infectious disease 10/25/2012  . Sleep apnea 10/25/2012  . Medically noncompliant 10/25/2012  . Obesity 10/25/2012    Past Surgical History:  Procedure Laterality Date  . ANTERIOR CERVICAL DECOMP/DISCECTOMY FUSION  05/2007   Archie Endo 02/16/2011  . BACK SURGERY  2008   cervical and thoracic decompression for myelopathy  . PROSTATE SURGERY  03/2012   "shaved it down;  didn't take it off"  . SHRAPNEL REMOVAL  1968   "HEAD, CHEST, LEFT HAND"  . SUPRAPUBIC CATHETER INSERTION    . THORACIC LAMINECTOMY  05/2007   Archie Endo 02/16/2011        Home Medications    Prior to Admission medications   Medication Sig Start Date End Date Taking? Authorizing Provider  Amino Acids-Protein Hydrolys (FEEDING SUPPLEMENT, PRO-STAT SUGAR FREE 64,) LIQD Take 30 mLs by mouth  daily.    [provider]  Calcium-Magnesium-Vitamin D (CALCIUM 500 PO) Take 1,000 mg by mouth daily. 2 tabs    [provider]  Cholecalciferol (VITAMIN D3) 10000 units capsule Take 10,000 Units by mouth daily.    [provider]  Coenzyme Q10 (CO Q 10 PO) Take 600 mg by mouth daily.    [provider]  furosemide (LASIX) 20 MG tablet Take 20 mg by mouth daily.     [provider]  Glucosamine-Chondroitin (GLUCOSAMINE CHONDR COMPLEX PO) Take 600 mg by mouth daily.    [provider]  meloxicam (MOBIC) 7.5 MG tablet Take 7.5 mg by mouth daily. 04/11/14   [provider]  Multiple Vitamins-Minerals (MULTIVITAMIN PO) Take 1 tablet by mouth daily.    [provider]  polyethylene glycol (MIRALAX) packet Take 17 g by mouth 2 (two) times daily. 09/26/17   Hongalgi, Lenis Dickinson, MD  senna (SENOKOT) 8.6 MG TABS tablet Take 2 tablets (17.2 mg total) by mouth at bedtime as needed for mild constipation or moderate constipation. 09/26/17   Hongalgi, Lenis Dickinson, MD  vitamin B-12 (CYANOCOBALAMIN) 1000 MCG tablet Take 2 tablets (2,000 mcg total) by mouth daily. 09/26/17   Hongalgi, Lenis Dickinson, MD  vitamin E 1000 UNIT capsule Take 1,000 Units by mouth daily.     [provider]    Family History Family History  Problem Relation Age of Onset  . Heart disease Mother   . Dementia Mother   . Heart disease Father   . Dementia Father     Social History Social History   Tobacco Use  . Smoking status: Never Smoker  . Smokeless tobacco: Never Used  . Tobacco comment: "smoked cigarettes  for ~ 1 wk in [place]"  Substance Use Topics  . Alcohol use: No  . Drug use: No     Allergies   Patient has no known allergies.   Review of Systems Review of Systems  All other systems reviewed and are negative.    Physical Exam Updated Vital Signs BP (!) 99/52   Pulse 89   Temp 98.4 F (36.9 C) (Oral)   Resp 11   SpO2 96%   Physical  Exam  Constitutional: He is oriented to person, place, and time. He appears well-developed. He appears distressed (He is uncomfortable).  Debilitated elderly man.  HENT:  Head: Normocephalic and atraumatic.  Right Ear: External ear normal.  Left Ear: External ear normal.  Eyes: Pupils are equal, round, and reactive to light. Conjunctivae and EOM are normal.  Neck: Normal range of motion and phonation normal. Neck supple.  Cardiovascular: Normal rate, regular rhythm and normal heart sounds.  Pulmonary/Chest: Effort normal and breath sounds normal. No respiratory distress. He exhibits no bony tenderness.  Abdominal: Soft. He exhibits no distension. There is no tenderness. There is no guarding.  Suprapubic catheter, site and appliance appear normal.  Musculoskeletal: Normal range of motion. He exhibits no edema or deformity.  Neurological: He is alert and oriented to person, place, and time.  No cranial nerve deficit or sensory deficit. He exhibits normal muscle tone. Coordination normal.  Skin: Skin is warm, dry and intact.  Psychiatric: He has a normal mood and affect. His behavior is normal.  Nursing note and vitals reviewed.    ED Treatments / Results  Labs (all labs ordered are listed, but only abnormal results are displayed) Labs Reviewed  CBC WITH DIFFERENTIAL/PLATELET - Abnormal; Notable for the following components:      Result Value   WBC 33.1 (*)    RBC 3.61 (*)    Hemoglobin 9.3 (*)    HCT 31.6 (*)    MCH 25.8 (*)    MCHC 29.4 (*)    Platelets 401 (*)    Neutro Abs 29.8 (*)    Monocytes Absolute 2.0 (*)    All other components within normal limits  COMPREHENSIVE METABOLIC PANEL - Abnormal; Notable for the following components:   Sodium 133 (*)    CO2 20 (*)    Glucose, Bld 155 (*)    BUN 35 (*)    Creatinine, Ser 2.35 (*)    Calcium 10.6 (*)    Albumin 1.9 (*)    GFR calc non Af Amer 26 (*)    GFR calc Af Amer 30 (*)    All other components within normal limits    I-STAT CG4 LACTIC ACID, ED - Abnormal; Notable for the following components:   Lactic Acid, Venous 2.14 (*)    All other components within normal limits  CULTURE, BLOOD (ROUTINE X 2)  CULTURE, BLOOD (ROUTINE X 2)  URINE CULTURE  URINALYSIS, ROUTINE W REFLEX MICROSCOPIC  I-STAT CG4 LACTIC ACID, ED    EKG None  Radiology Dg Chest Port 1 View  Result Date: 08/02/2018 CLINICAL DATA:  Abscess EXAM: PORTABLE CHEST 1 VIEW COMPARISON:  09/21/2017, 09/17/2017 FINDINGS: Low lung volumes. Elevation of the right diaphragm. No focal opacity or pleural effusion. Rounded enlargement of the right hilus. IMPRESSION: 1. Rounded enlargement of right hilus, could be secondary to elevated diaphragm and crowding of the central pulmonary vessels, however hilar nodes or mass are not excluded. Correlation with contrast-enhanced CT is suggested. 2. No focal pulmonary opacity. Electronically Signed   By: Donavan Foil M.D.   On: 08/02/2018 20:34    Procedures .Critical Care Performed by: Daleen Bo, MD Authorized by: Daleen Bo, MD   Critical care provider statement:    Critical care time (minutes):  50   Critical care start time:  08/02/2018 6:30 PM   Critical care end time:  08/02/2018 9:45 PM   Critical care time was exclusive of:  Separately billable procedures and treating other patients   Critical care was necessary to treat or prevent imminent or life-threatening deterioration of the following conditions:  Sepsis   Critical care was time spent personally by me on the following activities:  Blood draw for specimens, development of treatment plan with patient or surrogate, discussions with consultants, evaluation of patient's response to treatment, examination of patient, obtaining history from patient or surrogate, ordering and performing treatments and interventions, ordering and review of laboratory studies, pulse oximetry, re-evaluation of patient's condition, review of old charts and ordering  and review of radiographic studies   (including critical care time)  Medications Ordered in ED Medications  metroNIDAZOLE (FLAGYL) IVPB 500 mg (0 mg Intravenous Stopped 08/02/18 2100)  sodium chloride 0.9 % bolus 2,500 mL (2,500 mLs Intravenous New Bag/Given 08/02/18 2100)  ceFEPIme (MAXIPIME) 2 g in sodium chloride 0.9 % 100  mL IVPB (has no administration in time range)  sodium chloride 0.9 % bolus 500 mL (0 mLs Intravenous Stopped 08/02/18 2100)  ceFEPIme (MAXIPIME) 2 g in sodium chloride 0.9 % 100 mL IVPB (0 g Intravenous Stopped 08/02/18 2020)     Initial Impression / Assessment and Plan / ED Course  I have reviewed the triage vital signs and the nursing notes.  Pertinent labs & imaging results that were available during my care of the patient were reviewed by me and considered in my medical decision making (see chart for details).  Clinical Course as of Aug 02 2145  Fri Aug 02, 2018  2013 Records were obtained from the [facility] hospital.  He was there today and had CT imaging of the abdomen and pelvis; and blood work done.  Summary as follows: CT abdomen pelvis with air-fluid collection left retroperitoneal/psoas region.  Right nephrolithiasis, possibly staghorn formation.  Right ureteral stent, terminates near the ureterovesicular junction.  Status post left nephrectomy, and porcelain gallbladder present.  White count high 23,000.  Hemoglobin low 10.5.  Left Shift with 90% neutrophils.  Calcium 11.3.  Creatinine 2.2.  Remainder of CBC, and to bmet are normal.  Comparison labs not obtainable.  TSH 5.5   [EW]  2019 High  I-Stat CG4 Lactic Acid, ED(!!) [EW]  2020 Elevated white count, low hemoglobin, left shift neutrophils 90%  CBC with Differential(!) [EW]  2046 At time clinical examination is essentially unchanged.  He remains somewhat hypotensive.  His wife is happier now because he "appears less confused."  She is worried about his "titanium rods in his back."  Patient and wife updated on  findings.  He was offered oral liquids.  Full bolus, 30 L /kg ordered.   [EW]  2139 Normal  I-Stat CG4 Lactic Acid, ED [EW]  2140 Normal except sodium low, CO2 low, glucose high, BUN high, creatinine high, calcium high, albumin low, GFR low  Comprehensive metabolic panel(!) [EW]    Clinical Course User Index [EW] Daleen Bo, MD     Patient Vitals for the past 24 hrs:  BP Temp Temp src Pulse Resp SpO2  08/02/18 2045 (!) 99/52 - - 89 11 96 %  08/02/18 2030 (!) 95/56 - - 87 12 94 %  08/02/18 2022 (!) 93/55 - - - - -  08/02/18 1831 (!) 95/44 98.4 F (36.9 C) Oral 89 18 94 %    9:41 PM Reevaluation with update and discussion. After initial assessment and treatment, an updated evaluation reveals clinical status unchanged.  He remains somewhat hypotensive.  Patient and family I was updated on findings and plan. Daleen Bo   Medical Decision Making: Malaise, with back pain.  Retroperitoneal abscess was suspected on CT imaging that was done today at an outside facility.  Mildly elevated lactate with high blood white count.  Lactate improved with fluid.  Empiric antibiotic started.  Will require admission for further evaluation treatment.  He will benefit to have IR evaluate him for aspiration of abscess appearing fluid, to help with diagnosis and treatment.  CRITICAL CARE-yes Performed by: Daleen Bo  Nursing Notes Reviewed/ Care Coordinated Applicable Imaging Reviewed Interpretation of Laboratory Data incorporated into ED treatment  9:16 PM-Consult complete with admitting resident. Patient case explained and discussed.  She agrees to admit patient for further evaluation and treatment. Call ended at 9:45 PM  Final Clinical Impressions(s) / ED Diagnoses   Final diagnoses:  Retroperitoneal abscess ([place])  Sepsis with acute organ dysfunction, due to unspecified organism,  unspecified type, unspecified whether septic shock present [facility])    ED Discharge Orders    None         Daleen Bo, MD 08/02/18 2146

## 2018-08-02 NOTE — Progress Notes (Signed)
Pharmacy Antibiotic Note  Jerry [name] is a 72 y.o. male admitted on 08/02/2018 with sepsis/Intra-abdominal infection. Pt has diagnosis of retroperitoneal abscess from outside facility. Pt has a history of recurrent catheter-associated UTIs and has had a left nephrectomy. Pharmacy has been consulted for cefepime dosing. Lactic acid - 2.14, afebrile, WBC - 33.1, Scr-2.35, elevated from baseline of 1.1-1.3  Plan: Cefepime 2g x1, then Cefepime 2g q24h Flagyl 500mg  q8h per MD Monitor and adjust abx per renal fx, clinical status, C&S     Temp (24hrs), Avg:98.4 F (36.9 C), Min:98.4 F (36.9 C), Max:98.4 F (36.9 C)  No results for input(s): WBC, CREATININE, LATICACIDVEN, VANCOTROUGH, VANCOPEAK, VANCORANDOM, GENTTROUGH, GENTPEAK, GENTRANDOM, TOBRATROUGH, TOBRAPEAK, TOBRARND, AMIKACINPEAK, AMIKACINTROU, AMIKACIN in the last 168 hours.  CrCl cannot be calculated (Patient's most recent lab result is older than the maximum 21 days allowed.).    No Known Allergies  Antimicrobials this admission: Cefepime 10/18 >>  Flagyl 10/18 >>   Dose adjustments this admission: N/A  Microbiology results: Pending  Thank you for allowing pharmacy to be a part of this patient's care.  Tyson Babinski 08/02/2018 7:17 PM

## 2018-08-02 NOTE — ED Notes (Signed)
Dr Eulis Foster informed of lactic acid results 2.14

## 2018-08-02 NOTE — H&P (Signed)
Date: 08/03/2018               Patient Name:  Jerry [name] MRN: [id number]  DOB: [birth year] Age / Sex: 72 y.o., male   PCP: Aura Dials, MD         Medical Service: Internal Medicine Teaching Service         Attending Physician: Dr. Aldine Contes, MD    First Contact: Dr. Laural Golden, Areeg Pager: [phone]  Second Contact: Dr. Kalman Shan Pager: [phone]       After Hours (After 5p/  First Contact Pager: [phone]  weekends / holidays): Second Contact Pager: [phone]   Chief Complaint: Retroperitoneal abscess  History of Present Illness:  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 He was examined and evaluated in the ED with his ex-wife Jerry [name] present. He states he was in his usual state of health until about a week ago when he began to endorse loss of appetite and lethargy with feelings of 'haziness' and difficulty concentrating. Per Jerry [name], he was also noted to be slumped over and lethargic until earlier today when he received a bolus of fluid in the ED. He described He was evaluated by his [facility] PCP for initial patient visit and was ordered CT abdomen/pelvis to establish baseline CT for continuity of care in regards to his renal calculi and history of nephrectomy. He was found to have left retroperitoneal/psoas abscess measuring 6.9x8.8x17cm in size and was transferred to [facility] for evaluation.  On chart review, he has a complicated urologic history with recurrent UTIs with prior history of urosepsis growing pseudomonas, proteus, and providencia rettgeri, neurogenic bladder with suprapubic catheter placement 5 years ago, left nephrectomy due to pyelonephritis with abscess, and multiple staghorn calculi in right kidney with hydronephrosis s/p lithotripsy  and stent placement. He states his recurrent UTIs have been repeatedly treated with short course of antibiotics with his last treatment 2 months ago.   On review of systems, he mentions that he had subjective chills and muscular weakness of his lower extremities which is chronic. He mentions he was in the process of getting physical therapy until he was discharged due to running out of PT days. He is unable to get out of bed on his own or sit up. He believes the abscess on his psoas muscle must be preventing him from lifting his leg. He also mentions a hx of multiple back surgeries. He also mentions that he has had significant weight loss due to loss of appetite. He mentions 95 lb weight loss this year.  In the ED, he was found to have leukocytosis at 33.1 and lactate of 2.14. UA showed +leukocytes  and many bacteria. Blood cultures were drawn. Urine culture collected. 3L of NS were given. Lactate trended down to 1.83. Cefepime and flagyl were started.  Meds:  Current Meds  Medication Sig  . acetaminophen (TYLENOL) 325 MG tablet Take 650 mg by mouth 3 (three) times daily.    Allergies: Allergies as of 08/02/2018  . (No Known Allergies)   Past Medical History:  Diagnosis Date  . Acute kidney injury ([place]) 09/21/2017  . Arthritis    "hands, knees, back, legs" (06/10/2014)  . Atonic neurogenic bladder 06/30/2016  . Bilateral leg weakness 10/25/2012  . Cervical myelopathy ([place])   .  Chronic diastolic congestive heart failure ([place]) 06/23/2014  . Coma ([place]) 1968   "for 2 wks"  . Fall at home 10/25/2012  . Generalized weakness 06/23/2014  . GGYIRSWN(462.7)    "monthly" (06/10/2014)  . Lower urinary tract infectious disease 10/25/2012  . Malaria   . Obesity 10/25/2012  . Presence of suprapubic catheter ([place]) 06/17/2014   Due to prostate cancer   . Prostate cancer ([place])   . PTSD (post-traumatic stress disorder)    "I'm all over that now"  . Rhabdomyolysis 09/21/2017  . Sleep apnea 10/25/2012  .  Staghorn calculus 06/30/2016  . Staphylococcus aureus bacteremia 06/30/2014  . Urinary incontinence, functional 10/25/2012  . UTI (urinary tract infection) 09/2017   Family History:  Family History  Problem Relation Age of Onset  . Heart disease Mother   . Dementia Mother   . Heart disease Father   . Dementia Father    Social History:  Lives at [facility] assisted living facility by himself. Has difficulty getting out of bed. Does not perform IADLs at baseline. Able to perform some ADLs. Denies any EtOH, tobacco, illicit substance use  Review of Systems: A complete ROS was negative except as per HPI.  Physical Exam: Blood pressure (!) 93/46, pulse 74, temperature 98.4 F (36.9 C), temperature source Oral, resp. rate 18, height 5\' 11"  (1.803 m), weight 117.9 kg, SpO2 97 %. Physical Exam  Constitutional: He is well-developed, well-nourished, and in no distress. No distress.  Obese  HENT:  Head: Normocephalic and atraumatic.  Mouth/Throat: Oropharynx is clear and moist. No oropharyngeal exudate.  Eyes: Pupils are equal, round, and reactive to light. Conjunctivae and EOM are normal. No scleral icterus.  Neck: Normal range of motion. Neck supple. No JVD present.  Cardiovascular: Normal rate, regular rhythm, normal heart sounds and intact distal pulses.  No murmur heard. Pulmonary/Chest: Effort normal and breath sounds normal.  Abdominal: Soft. Bowel sounds are normal. He exhibits mass (lower quadrant mass palpated around bilateral iliac regions). He exhibits no distension. There is no tenderness.  Genitourinary:  Genitourinary Comments: Suprapubic catheter in place. Some foul-smelling purulent drainage observed around the catheter site. No surrounding erythema or warmth. Catheter bag had 400cc of amber colored urine.  Musculoskeletal:  Unable to perform L hip flexion  Lymphadenopathy:    He has no cervical adenopathy.  Neurological:  Neurologic exam: Mental status: A&Ox3 Motor:  Strength 4/5 on upper extremities. 3/5 on right lower extremity, 2/5 on left lower extremity, bulk muscle and tone are normal   Psychiatric: Normal mood and affect  Skin: Skin is warm and dry. He is not diaphoretic.  Psychiatric: Mood, memory, affect and judgment normal.   EKG: N/A  CXR: personally reviewed my interpretation is rotated, elevated right hemidiaphragm, R perihilar opacity of unclear etiology, no lobar consolidation, no pleural effusion, no pulmonary edema.  Assessment & Plan by Problem: Mr.Garlow is a 72 yo M w/ complicated urologic history presenting with malaise. He was found to have abscess within the left psoas on imaging with leukocytosis and elevated lactate. Unclear etiology of his psoas abscess. Suspect possible retroperitoneal abscess from seeding through suprapubic catheter site although the abscess formed in retroperitoneum. Will admit for treatment with IV antibiotics and consult IR for possible drainage in the AM.  Leukocytosis 2/2 psoas abscess vs catheter site infection WBC 33.1 Admit lactate 2.14 trended down to 1.83. Abscess within left psoas 6.9 x 8.8 x 17 per CT report from [place]. UA+ for leukocytes and bacteria. No CVA tenderness,  loss of L hip flexion on exam. Afebrile. BP 93/46 - C/w cefepime and flagyl - F/u Blood, urine culture - Trend CBCs - IR consult in the AM for drainage of abscess and culture - NPO for possible procedure - Tylenol PRN for fever & pain  Acute on chronic CKD 2/2 nephrolithiasis, hydronephrosis & hx of L nephrectomy Baseline creatine from 9-10 months ago at 1.2 Admit creatine 2.35. Unclear if new baseline or AKI. Possible dehydration in setting of acute infection. Given 4L NS bolus in ED. - Daily BMPs  Hx of HFpEF Used to be on Lasix PO 20mg  daily at home - Monitor for signs of hypervolemia  Low TSH Blood work from [place] show TSH at 5.55  - f/u T4  DVT prophx: SCDs Diet: NPO Bowel: Senokot Code: DNR  Dispo: Admit patient to  Inpatient with expected length of stay greater than 2 midnights.  Signed: Mosetta Anis, MD 08/03/2018, 12:59 AM  Pager: Pager: [phone]

## 2018-08-03 ENCOUNTER — Inpatient Hospital Stay (HOSPITAL_COMMUNITY): Payer: Medicare Other

## 2018-08-03 DIAGNOSIS — N189 Chronic kidney disease, unspecified: Secondary | ICD-10-CM

## 2018-08-03 DIAGNOSIS — E039 Hypothyroidism, unspecified: Secondary | ICD-10-CM

## 2018-08-03 DIAGNOSIS — I5032 Chronic diastolic (congestive) heart failure: Secondary | ICD-10-CM

## 2018-08-03 DIAGNOSIS — D649 Anemia, unspecified: Secondary | ICD-10-CM

## 2018-08-03 DIAGNOSIS — K828 Other specified diseases of gallbladder: Secondary | ICD-10-CM

## 2018-08-03 DIAGNOSIS — N118 Other chronic tubulo-interstitial nephritis: Secondary | ICD-10-CM

## 2018-08-03 DIAGNOSIS — Z66 Do not resuscitate: Secondary | ICD-10-CM

## 2018-08-03 DIAGNOSIS — Z79899 Other long term (current) drug therapy: Secondary | ICD-10-CM

## 2018-08-03 DIAGNOSIS — N132 Hydronephrosis with renal and ureteral calculous obstruction: Secondary | ICD-10-CM

## 2018-08-03 LAB — BASIC METABOLIC PANEL
ANION GAP: 10 (ref 5–15)
BUN: 33 mg/dL — ABNORMAL HIGH (ref 8–23)
CO2: 20 mmol/L — ABNORMAL LOW (ref 22–32)
Calcium: 9.8 mg/dL (ref 8.9–10.3)
Chloride: 108 mmol/L (ref 98–111)
Creatinine, Ser: 2.28 mg/dL — ABNORMAL HIGH (ref 0.61–1.24)
GFR, EST AFRICAN AMERICAN: 31 mL/min — AB (ref >60)
GFR, EST NON AFRICAN AMERICAN: 27 mL/min — AB (ref >60)
Glucose, Bld: 94 mg/dL (ref 70–99)
POTASSIUM: 3.5 mmol/L (ref 3.5–5.1)
SODIUM: 138 mmol/L (ref 135–145)

## 2018-08-03 LAB — CBC
HCT: 26.8 % — ABNORMAL LOW (ref 39.0–52.0)
HEMATOCRIT: 24.1 % — AB (ref 39.0–52.0)
HEMOGLOBIN: 7.3 g/dL — AB (ref 13.0–17.0)
Hemoglobin: 7.9 g/dL — ABNORMAL LOW (ref 13.0–17.0)
MCH: 26.1 pg (ref 26.0–34.0)
MCH: 26.4 pg (ref 26.0–34.0)
MCHC: 29.5 g/dL — ABNORMAL LOW (ref 30.0–36.0)
MCHC: 30.3 g/dL (ref 30.0–36.0)
MCV: 88.4 fL (ref 80.0–100.0)
MCV: 87.3 fL (ref 80.0–100.0)
NRBC: 0 % (ref 0.0–0.2)
NRBC: 0 % (ref 0.0–0.2)
Platelets: 304 10*3/uL (ref 150–400)
Platelets: 274 10*3/uL (ref 150–400)
RBC: 3.03 MIL/uL — AB (ref 4.22–5.81)
RBC: 2.76 MIL/uL — ABNORMAL LOW (ref 4.22–5.81)
RDW: 15.5 % (ref 11.5–15.5)
RDW: 15.6 % — ABNORMAL HIGH (ref 11.5–15.5)
WBC: 23.2 10*3/uL — AB (ref 4.0–10.5)
WBC: 20.2 10*3/uL — AB (ref 4.0–10.5)

## 2018-08-03 LAB — MRSA PCR SCREENING: MRSA by PCR: NEGATIVE

## 2018-08-03 LAB — PROTIME-INR
INR: 1.36
Prothrombin Time: 16.6 seconds — ABNORMAL HIGH (ref 11.4–15.2)

## 2018-08-03 LAB — APTT: aPTT: 43 seconds — ABNORMAL HIGH (ref 24–36)

## 2018-08-03 LAB — T4, FREE: FREE T4: 0.86 ng/dL (ref 0.82–1.77)

## 2018-08-03 LAB — GLUCOSE, CAPILLARY: Glucose-Capillary: 93 mg/dL (ref 70–99)

## 2018-08-03 MED ORDER — VANCOMYCIN HCL 10 G IV SOLR
1250.0000 mg | INTRAVENOUS | Status: DC
  Administered 2018-08-04 – 2018-08-07 (×4): 1250 mg via INTRAVENOUS
  Filled 2018-08-03 (×4): qty 1250

## 2018-08-03 MED ORDER — SODIUM CHLORIDE 0.9 % IV SOLN
2.0000 g | INTRAVENOUS | Status: DC
  Administered 2018-08-03: 2 g via INTRAVENOUS
  Filled 2018-08-03 (×2): qty 2

## 2018-08-03 MED ORDER — VANCOMYCIN HCL 10 G IV SOLR
2000.0000 mg | Freq: Once | INTRAVENOUS | Status: AC
  Administered 2018-08-03: 2000 mg via INTRAVENOUS
  Filled 2018-08-03: qty 2000

## 2018-08-03 NOTE — Progress Notes (Signed)
Pharmacy Antibiotic Note  Jerry [name] is a 72 y.o. male admitted on 08/02/2018 with sepsis/Intra-abdominal infection. Pt has diagnosis of retroperitoneal abscess from outside facility. Pt has a history of recurrent catheter-associated UTIs and has had a left nephrectomy. Pharmacy has been consulted for cefepime and vancomycin dosing. Lactic acid - 2.14, afebrile, WBC - 33.1, Scr-2.35, elevated from baseline of 1.1-1.3  Plan: Cefepime 2g q24h Vancomycin 2gm IV x 1 then 1250 mg IV q24 hours Monitor and adjust abx per renal fx, clinical status, C&S  Height: 5\' 11"  (180.3 cm) Weight: 229 lb 0.9 oz (103.9 kg) IBW/kg (Calculated) : 75.3  Temp (24hrs), Avg:98.2 F (36.8 C), Min:97.9 F (36.6 C), Max:98.4 F (36.9 C)  Recent Labs  Lab 08/02/18 1921 08/02/18 1933 08/02/18 2101  WBC 33.1*  --   --   CREATININE 2.35*  --   --   LATICACIDVEN  --  2.14* 1.83    Estimated Creatinine Clearance: 33.8 mL/min (A) (by C-G formula based on SCr of 2.35 mg/dL (H)).    No Known Allergies  Antimicrobials this admission: Cefepime 10/18 >>  Flagyl 10/18 >>  Vancomycin 10/19>>  Dose adjustments this admission: N/A  Microbiology results: Pending  Thank you for allowing pharmacy to be a part of this patient's care.  Excell Seltzer Poteet 08/03/2018 6:58 AM

## 2018-08-03 NOTE — Progress Notes (Signed)
Initial Nutrition Assessment  DOCUMENTATION CODES:   Obesity unspecified  INTERVENTION:    Advance diet as medically appropriate; add supplements when able  NUTRITION DIAGNOSIS:   Increased nutrient needs related to acute illness as evidenced by estimated needs  GOAL:   Patient will meet greater than or equal to 90% of their needs  MONITOR:   Diet advancement, PO intake, Supplement acceptance, Labs, Skin, Weight trends, I & O's  REASON FOR ASSESSMENT:   Malnutrition Screening Tool  ASSESSMENT:   72 yo Male with complicated urologic history presenting with malaise. He was found to have abscess within the left psoas on imaging with leukocytosis and elevated lactate. Unclear etiology of his psoas abscess.  RD spoke with patient at bedside. He is grumpy. Reports a decreased appetite x 1 week PTA.  + N/V. Was able to tolerate some jello.  He also endorses a 95 lb weight loss in the last year. Severe for time frame. Pt is currently NPO, however, would like cream of wheat & fruit when able. Informed him we have fresh fruit & fruit and cottage cheese plates available.  He doesn't care for Ensure or Boost supplements. Amenable to trying Boost Breeze when able. Labs & medications reviewed. CBG 93.  NUTRITION - FOCUSED PHYSICAL EXAM:  Deferred at this time.  Diet Order:   Diet Order            Diet NPO time specified  Diet effective midnight             EDUCATION NEEDS:   No education needs have been identified at this time  Skin:  Skin Assessment: Skin Integrity Issues: Skin Integrity Issues:: Other (Comment) Other: MASD to buttocks  Last BM:  10/19  Height:   Ht Readings from Last 1 Encounters:  08/03/18 5\' 11"  (1.803 m)   Weight:   Wt Readings from Last 1 Encounters:  08/03/18 103.9 kg   BMI:  Body mass index is 31.95 kg/m.  Estimated Nutritional Needs:   Kcal:  1800-2000  Protein:  90-105 gm  Fluid:  1.8-2.0 L  Arthur Holms, RD,  LDN Pager #: [phone] After-Hours Pager #: [phone]

## 2018-08-03 NOTE — Progress Notes (Addendum)
   Subjective: Jerry [name] reported feeling weak this morning. He stated he has had ongoing LE weakness but it felt worse over the past week.  He denied any shortness of breath, chest pain, abdominal pain, nausea or vomiting.  Stated that he has noticed a decreased appetite the past week and has lost 95 pounds in the past year.  He said he has been having trouble lifting his left leg.  He denied any pain around his suprapubic catheter but stated he has had many UTIs in the past.  He said usually gets his catheter changed on the sixth of every month.  Was last changed on October 6 according to the patient.     Objective:  Vital signs in last 24 hours: Vitals:   08/02/18 2345 08/03/18 0002 08/03/18 0039 08/03/18 0436  BP: (!) 91/49  (!) 93/46 (!) 92/48  Pulse: 75  74 66  Resp:   18 18  Temp:  98.4 F (36.9 C)  97.9 F (36.6 C)  TempSrc:  Oral    SpO2: 97%  97% 97%  Weight:    103.9 kg  Height:    5\' 11"  (1.803 m)   General- seen lying in bed, NAD Heart- RRR, no murmurs Lungs- CTA on lateral lung fields Abdomen- bowel sounds present, soft, non tender Extremities- no edema Skin- suprapubic catheter in place, no pus or purulent drainage seen  Assessment/Plan:  Active Problems:   Psoas abscess, left (HCC)  JerryMederos is a 72 yo M w/ complicated urologic history presenting with malaise. He was found to have abscess within the left psoas on imaging with leukocytosis and elevated lactate. Unclear etiology of his psoas abscess. Suspect possible retroperitoneal abscess from seeding through suprapubic catheter site although the abscess formed in retroperitoneum.   Leukocytosis 2/2 psoas abscess vs catheter site infection - abscess within left psoas 6.9 x 8.8 x 17 per CT report from [place] - ordered CT abdomen pelvics wo contrast  - wbc 23, down 33.1; Hgb 7.9 down from 9.3, likely dilutional from IVFs, will monitor - c/w cefepime, vancomycin and flagyl - f/u blood, urine culture - consult IR  for possible drainage - CT report from [place] also showed right sided ureteral stent stating distal end of the stent appeared to be near ureterovesical junction rather than within the lumen of the bladder  - will consult Urology for suprapubic catheter evaluation - NPO for possible procedure - Tylenol PRN for fever & pain - appreciate urology and IR recommendations  Porcelain Gallbladder - CT report from [place] shows porcelain gallbladder - LFTs wnl - will consult general surgery, appreciate recommendations  Acute on chronic CKD 2/2 nephrolithiasis, hydronephrosis & hx of L nephrectomy Baseline creatine from 9-10 months ago at 1.2 Admit creatine 2.35. Unclear if new baseline or AKI. Possible dehydration in setting of acute infection. Given 4L NS bolus in ED - Cr 2.28 - Daily BMPs  Hx of HFpEF Used to be on Lasix PO 20mg  daily at home - Monitor for signs of hypervolemia  Low TSH Blood work from [place] show TSH at 5.55  - f/u T4 0.86  Dispo: Anticipated discharge is pending patient's clinical improvement. IR consulted for psoas abscess drainage, surgery and urology consulted as well.   Mike Craze, DO 08/03/2018, 10:29 AM Pager: [phone]

## 2018-08-04 ENCOUNTER — Encounter (HOSPITAL_COMMUNITY): Payer: Self-pay | Admitting: Physician Assistant

## 2018-08-04 ENCOUNTER — Inpatient Hospital Stay (HOSPITAL_COMMUNITY): Payer: Medicare Other

## 2018-08-04 LAB — BASIC METABOLIC PANEL
Anion gap: 7 (ref 5–15)
BUN: 28 mg/dL — AB (ref 8–23)
CALCIUM: 9.6 mg/dL (ref 8.9–10.3)
CO2: 20 mmol/L — ABNORMAL LOW (ref 22–32)
Chloride: 111 mmol/L (ref 98–111)
Creatinine, Ser: 1.88 mg/dL — ABNORMAL HIGH (ref 0.61–1.24)
GFR, EST AFRICAN AMERICAN: 39 mL/min — AB (ref >60)
GFR, EST NON AFRICAN AMERICAN: 34 mL/min — AB (ref >60)
GLUCOSE: 90 mg/dL (ref 70–99)
Potassium: 2.8 mmol/L — ABNORMAL LOW (ref 3.5–5.1)
SODIUM: 138 mmol/L (ref 135–145)

## 2018-08-04 LAB — CBC
HCT: 24.3 % — ABNORMAL LOW (ref 39.0–52.0)
HEMOGLOBIN: 7.1 g/dL — AB (ref 13.0–17.0)
MCH: 25.4 pg — AB (ref 26.0–34.0)
MCHC: 29.2 g/dL — AB (ref 30.0–36.0)
MCV: 86.8 fL (ref 80.0–100.0)
PLATELETS: 301 10*3/uL (ref 150–400)
RBC: 2.8 MIL/uL — AB (ref 4.22–5.81)
RDW: 15.5 % (ref 11.5–15.5)
WBC: 17.4 10*3/uL — AB (ref 4.0–10.5)
nRBC: 0 % (ref 0.0–0.2)

## 2018-08-04 LAB — MAGNESIUM: Magnesium: 1.8 mg/dL (ref 1.7–2.4)

## 2018-08-04 LAB — IRON AND TIBC
Iron: 15 ug/dL — ABNORMAL LOW (ref 45–182)
Saturation Ratios: 10 % — ABNORMAL LOW (ref 17.9–39.5)
TIBC: 146 ug/dL — ABNORMAL LOW (ref 250–450)
UIBC: 131 ug/dL

## 2018-08-04 LAB — FERRITIN: FERRITIN: 565 ng/mL — AB (ref 24–336)

## 2018-08-04 LAB — GLUCOSE, CAPILLARY: Glucose-Capillary: 102 mg/dL — ABNORMAL HIGH (ref 70–99)

## 2018-08-04 LAB — SAVE SMEAR(SSMR), FOR PROVIDER SLIDE REVIEW

## 2018-08-04 MED ORDER — MIDAZOLAM HCL 2 MG/2ML IJ SOLN
INTRAMUSCULAR | Status: AC | PRN
  Administered 2018-08-04 (×4): 0.5 mg via INTRAVENOUS

## 2018-08-04 MED ORDER — POTASSIUM CHLORIDE CRYS ER 20 MEQ PO TBCR
40.0000 meq | EXTENDED_RELEASE_TABLET | Freq: Two times a day (BID) | ORAL | Status: AC
  Administered 2018-08-04 (×2): 40 meq via ORAL
  Filled 2018-08-04 (×2): qty 2

## 2018-08-04 MED ORDER — SODIUM CHLORIDE 0.9 % IV SOLN
2.0000 g | Freq: Two times a day (BID) | INTRAVENOUS | Status: DC
  Administered 2018-08-04 – 2018-08-07 (×6): 2 g via INTRAVENOUS
  Filled 2018-08-04 (×7): qty 2

## 2018-08-04 MED ORDER — LIDOCAINE HCL 1 % IJ SOLN
INTRAMUSCULAR | Status: AC
  Filled 2018-08-04: qty 20

## 2018-08-04 MED ORDER — MIDAZOLAM HCL 2 MG/2ML IJ SOLN
INTRAMUSCULAR | Status: AC
  Filled 2018-08-04: qty 2

## 2018-08-04 MED ORDER — FENTANYL CITRATE (PF) 100 MCG/2ML IJ SOLN
INTRAMUSCULAR | Status: AC | PRN
  Administered 2018-08-04 (×4): 25 ug via INTRAVENOUS

## 2018-08-04 MED ORDER — SODIUM CHLORIDE 0.9% FLUSH
5.0000 mL | Freq: Three times a day (TID) | INTRAVENOUS | Status: DC
  Administered 2018-08-04 – 2018-08-20 (×45): 5 mL

## 2018-08-04 MED ORDER — FENTANYL CITRATE (PF) 100 MCG/2ML IJ SOLN
INTRAMUSCULAR | Status: AC
  Filled 2018-08-04: qty 2

## 2018-08-04 MED ORDER — ENOXAPARIN SODIUM 40 MG/0.4ML ~~LOC~~ SOLN
40.0000 mg | SUBCUTANEOUS | Status: AC
  Administered 2018-08-05 – 2018-08-18 (×14): 40 mg via SUBCUTANEOUS
  Filled 2018-08-04 (×15): qty 0.4

## 2018-08-04 NOTE — Progress Notes (Signed)
Pharmacy Antibiotic Note  Jerry [name] is a 72 y.o. male admitted on 08/02/2018 with sepsis/Intra-abdominal infection. Pt has diagnosis of retroperitoneal abscess from outside facility. Pt has a history of recurrent catheter-associated UTIs and has had a left nephrectomy. Pharmacy has been consulted for Cefepime and Vancomycin dosing.  IR consulted today and 2 drains have been placed with purulent drainage noted. Urine cultures showing 100k Psuedomonas. With improving renal function - will adjust Cefepime dose today, Vancomycin and Flagyl doses remain appropriate at this time.  Plan: - Adjust Cefepime to 2g IV every 12 hours - Continue Vancomycin 1250 mg IV every 24 hours - Continue Flagyl 500 mg IV every 8 hours per MD  Height: 5\' 11"  (180.3 cm) Weight: 229 lb 0.9 oz (103.9 kg) IBW/kg (Calculated) : 75.3  Temp (24hrs), Avg:98.1 F (36.7 C), Min:97.8 F (36.6 C), Max:98.3 F (36.8 C)  Recent Labs  Lab 08/02/18 1921 08/02/18 1933 08/02/18 2101 08/03/18 0603 08/03/18 1637 08/04/18 0353  WBC 33.1*  --   --  23.2* 20.2* 17.4*  CREATININE 2.35*  --   --  2.28*  --  1.88*  LATICACIDVEN  --  2.14* 1.83  --   --   --     Estimated Creatinine Clearance: 42.3 mL/min (A) (by C-G formula based on SCr of 1.88 mg/dL (H)).    No Known Allergies  Antimicrobials this admission: Cefepime 10/18 >>  Flagyl 10/18 >>  Vancomycin 10/19>>  Dose adjustments this admission: N/A  Microbiology results: 10/18 UCx >> 100k Pseudomonas 10/18 MRSA PCR >> neg 10/20 Abd abscess >> GS with GNR/GPC >> pending 10/20 L-flank abscess >> GS with GNR/GPC >> pending  Thank you for allowing pharmacy to be a part of this patient's care.  Alycia Rossetti, PharmD, BCPS Clinical Pharmacist Pager: [phone] Clinical phone for 08/04/2018 from 7a-3:30p: [phone] If after 3:30p, please call main pharmacy at: [phone] Please check AMION for all [place] numbers 08/04/2018 3:21 PM

## 2018-08-04 NOTE — Procedures (Signed)
Interventional Radiology Procedure Note  Procedure: CT guided drainage of left  retroperitoeal abscess, and left flank abscess.  2 x 63F drains placed.  Lateral drain is in the RP More medial drain is in the flank   Complications: None  Recommendations:  - Routine drain care - follow up cx's - Do not submerge    Signed,  Dulcy Fanny. Earleen Newport, DO

## 2018-08-04 NOTE — H&P (Signed)
Chief Complaint: Abscesses  Referring Physician(s): Rehman, Areeg N  Supervising Physician: Corrie Mckusick  Patient Status: [facility] - [facility]  History of Present Illness: Jerry [name] is a 72 y.o. 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  He was sent to [facility] by the [facility] in [place] for a large retroperitoneal abscess which was diagnosed on an outpatient CT abdomen pelvis.  He states that approximately 1 week ago he noted loss of appetite and ncreased lethargy.   He also states that his weakness had worsened and he was unable to ambulate without assistance.    CT abdomen/pelvis done at the [facility] showed left retroperitoneal/psoas abscess measuring 6.9 x 8.8 x 17 cm.  He complains of chills, but unsure if he had a fever.   He denies chest pain or shortness of breath. He denies nausea or vomiting. He has no abdominal pain.  WBC was 33 and an elevated lactic acid of 2.14.  Repeat CT scan done here showed = Fluid and gas collection of the left retroperitoneal space, potentially abscess, hematoma, or seroma/lymphocele. Status post left nephrectomy. Flocculent material in the left flank, concerning for abscess.  We are asked to evaluate for image guided drainage/drain placement.  Past Medical History:  Diagnosis Date  . Acute kidney injury ([place]) 09/21/2017  . Arthritis    "hands, knees, back, legs" (06/10/2014)  . Atonic neurogenic bladder 06/30/2016  . Bilateral leg weakness 10/25/2012  . Cervical myelopathy ([place])   . Chronic diastolic congestive heart failure ([place]) 06/23/2014  . Coma ([place]) 1968   "for 2 wks"  . Fall at home 10/25/2012  . Generalized weakness 06/23/2014  . SJGGEZMO(294.7)    "monthly" (06/10/2014)  . Lower urinary tract infectious disease  10/25/2012  . Malaria   . Obesity 10/25/2012  . Presence of suprapubic catheter ([place]) 06/17/2014   Due to prostate cancer   . Prostate cancer ([place])   . PTSD (post-traumatic stress disorder)    "I'm all over that now"  . Rhabdomyolysis 09/21/2017  . Sleep apnea 10/25/2012  . Staghorn calculus 06/30/2016  . Staphylococcus aureus bacteremia 06/30/2014  . Urinary incontinence, functional 10/25/2012  . UTI (urinary tract infection) 09/2017    Past Surgical History:  Procedure Laterality Date  . ANTERIOR CERVICAL DECOMP/DISCECTOMY FUSION  05/2007   Archie Endo 02/16/2011  . BACK SURGERY  2008   cervical and thoracic decompression for myelopathy  . PROSTATE SURGERY  03/2012   "shaved it down; didn't take it off"  . SHRAPNEL REMOVAL  1968   "HEAD, CHEST, LEFT HAND"  . SUPRAPUBIC CATHETER INSERTION    . THORACIC LAMINECTOMY  05/2007   Archie Endo 02/16/2011    Allergies: Patient has no known allergies.  Medications: Prior to Admission medications   Medication Sig Start Date End Date Taking? Authorizing Provider  acetaminophen (TYLENOL) 325 MG tablet Take 650 mg by mouth 3 (three) times daily.   Yes [provider]  polyethylene glycol (MIRALAX) packet Take 17 g by mouth 2 (two) times daily. Patient not taking: Reported on 08/02/2018 09/26/17   Modena Jansky, MD  senna (SENOKOT) 8.6 MG TABS tablet Take 2 tablets (17.2 mg total) by mouth at bedtime as needed for mild constipation or moderate constipation. Patient not taking: Reported on 08/02/2018 09/26/17   Modena Jansky, MD  vitamin B-12 (CYANOCOBALAMIN) 1000  MCG tablet Take 2 tablets (2,000 mcg total) by mouth daily. Patient not taking: Reported on 08/02/2018 09/26/17   Modena Jansky, MD     Family History  Problem Relation Age of Onset  . Heart disease Mother   . Dementia Mother   . Heart disease Father   . Dementia Father     Social History   Socioeconomic History  . Marital status: Single    Spouse name: Not on file    . Number of children: Not on file  . Years of education: Not on file  . Highest education level: Not on file  Occupational History  . Occupation: retired  Scientific laboratory technician  . Financial resource strain: Not on file  . Food insecurity:    Worry: Not on file    Inability: Not on file  . Transportation needs:    Medical: Not on file    Non-medical: Not on file  Tobacco Use  . Smoking status: Never Smoker  . Smokeless tobacco: Never Used  . Tobacco comment: "smoked cigarettes  for ~ 1 wk in [place]"  Substance and Sexual Activity  . Alcohol use: No  . Drug use: No  . Sexual activity: Not Currently  Lifestyle  . Physical activity:    Days per week: Not on file    Minutes per session: Not on file  . Stress: Not on file  Relationships  . Social connections:    Talks on phone: Not on file    Gets together: Not on file    Attends religious service: Not on file    Active member of club or organization: Not on file    Attends meetings of clubs or organizations: Not on file    Relationship status: Not on file  Other Topics Concern  . Not on file  Social History Narrative   Admitted to [facility] 09/26/17   Single   Never smoked   Alcohol none   [employer]: [employer]   Full Code     Review of Systems: A 12 point ROS discussed and pertinent positives are indicated in the HPI above.  All other systems are negative.  Review of Systems  Vital Signs: BP (!) 96/47 (BP Location: Right Arm)   Pulse 69   Temp 97.8 F (36.6 C) (Oral)   Resp 16   Ht 5\' 11"  (1.803 m)   Wt 103.9 kg   SpO2 94%   BMI 31.95 kg/m   Physical Exam  Constitutional: He is oriented to person, place, and time. He appears well-developed.  HENT:  Head: Normocephalic and atraumatic.  Eyes: EOM are normal.  Neck: Normal range of motion.  Cardiovascular: Normal rate, regular rhythm and normal heart sounds.  Pulmonary/Chest: Effort normal and breath sounds normal.  Abdominal: Soft. He exhibits no  distension. There is no tenderness.  Musculoskeletal: Normal range of motion.  Neurological: He is alert and oriented to person, place, and time.  Skin: Skin is warm and dry.  Psychiatric: He has a normal mood and affect. His behavior is normal. Judgment and thought content normal.  Vitals reviewed.   Imaging: Ct Abdomen Pelvis Wo Contrast  Result Date: 08/04/2018 CLINICAL DATA:  72 year old male with a history of retroperitoneal abscess. EXAM: CT ABDOMEN AND PELVIS WITHOUT CONTRAST TECHNIQUE: Multidetector CT imaging of the abdomen and pelvis was performed following the standard protocol without IV contrast. COMPARISON:  No prior CT. Plain film 08/03/2018, CT 02/23/2018, 02/23/2018, 01/01/2018 FINDINGS: Lower chest: Atelectasis of  the inferior lungs. Coronary artery disease and valve calcifications. Hepatobiliary: Unremarkable appearance of the liver. Cholelithiasis. No pericholecystic fluid or inflammatory changes. Pancreas: Unremarkable Spleen: Unremarkable Adrenals/Urinary Tract: Unremarkable adrenal glands. Double-J right ureteral stent with the proximal loop formed in the collecting system and the distal loop formed in the distal ureter. Nephrolithiasis of the right collecting system measuring 15 mm, 10 mm, 15 mm. No perinephric stranding on the right. Suprapubic catheter within the urinary bladder with bladder decompressed. Surgical changes of left nephrectomy. Fluid and gas collection in the left retroperitoneal space, continuous with the psoas muscle medially and the lateral abdominal wall laterally measuring 111 mm by 75 mm on the axial images. Flocculent material at the superior aspect of the fluid collection. The left colon is displaced anteriorly. Uncertain if there is a connection between left colon and the abscess cavity on this CT image. Flocculent material within the left-sided flank measures 138 mm with no definite connection to the retroperitoneal space. Stomach/Bowel: Hiatal hernia.  Unremarkable stomach. Small bowel partially distended with air-fluid levels. No transition point. Moderate stool burden. No significantly distended colon. Colonic diverticular change without associated inflammatory changes. Appendix is not visualized, however, no inflammatory changes are present adjacent to the cecum to indicate an appendicitis. Vascular/Lymphatic: Calcifications of the abdominal aorta. No aneurysm. Calcifications of the iliac arteries. Reproductive: Unremarkable prostate. Other: None Musculoskeletal: Multilevel degenerative changes of the visualized thoracolumbar spine. No acute displaced fracture. Degenerative changes of bilateral hips. Surgical changes of thoracic laminectomy IMPRESSION: Fluid and gas collection of the left retroperitoneal space, potentially abscess, hematoma, or seroma/lymphocele. Status post left nephrectomy. Flocculent material in the left flank, concerning for abscess. Right-sided ureteral stent with the proximal loop formed in the collecting system and the distal loop formed in the distal ureter. Referral for follow-up urologic evaluation is recommended. Multiple nonobstructing stones of the right collecting system. Cholelithiasis without evidence of acute inflammatory changes. Diverticular disease without evidence of acute diverticulitis. Moderate stool burden with no evidence of obstruction. Aortic Atherosclerosis (ICD10-I70.0). Electronically Signed   By: Corrie Mckusick D.O.   On: 08/04/2018 08:37   Dg Abd 1 View  Result Date: 08/03/2018 CLINICAL DATA:  Chronic generalized abdominal pain. Ureteral stent displacement. EXAM: ABDOMEN - 1 VIEW COMPARISON:  02/26/2018 FINDINGS: Nonobstructive/nonspecific bowel gas pattern. Paucity of gas in the left abdomen with gas collection overlying the left flank. Right-sided ureteral stent with proximal tip in the expected location of the right renal pelvis and distal tip in the right pelvis. IMPRESSION: Unchanged positioning of the  right ureteral stent. Nonspecific bowel gas pattern. Gas collection overlies the left flank. Electronically Signed   By: Fidela Salisbury M.D.   On: 08/03/2018 17:34   Dg Chest Port 1 View  Result Date: 08/02/2018 CLINICAL DATA:  Abscess EXAM: PORTABLE CHEST 1 VIEW COMPARISON:  09/21/2017, 09/17/2017 FINDINGS: Low lung volumes. Elevation of the right diaphragm. No focal opacity or pleural effusion. Rounded enlargement of the right hilus. IMPRESSION: 1. Rounded enlargement of right hilus, could be secondary to elevated diaphragm and crowding of the central pulmonary vessels, however hilar nodes or mass are not excluded. Correlation with contrast-enhanced CT is suggested. 2. No focal pulmonary opacity. Electronically Signed   By: Donavan Foil M.D.   On: 08/02/2018 20:34    Labs:  CBC: Recent Labs    08/02/18 1921 08/03/18 0603 08/03/18 1637 08/04/18 0353  WBC 33.1* 23.2* 20.2* 17.4*  HGB 9.3* 7.9* 7.3* 7.1*  HCT 31.6* 26.8* 24.1* 24.3*  PLT 401* 304  274 301    COAGS: Recent Labs    08/03/18 0603  INR 1.36  APTT 43*    BMP: Recent Labs    09/22/17 0434 11/05/17 08/02/18 1921 08/03/18 0603 08/04/18 0353  NA 134* 138 133* 138 138  K 3.7 3.9 3.5 3.5 2.8*  CL 97*  --  101 108 111  CO2 26  --  20* 20* 20*  GLUCOSE 121*  --  155* 94 90  BUN 20 16 35* 33* 28*  CALCIUM 8.7*  --  10.6* 9.8 9.6  CREATININE 1.15 1.3 2.35* 2.28* 1.88*  GFRNONAA >60  --  26* 27* 34*  GFRAA >60  --  30* 31* 39*    LIVER FUNCTION TESTS: Recent Labs    09/21/17 0743 08/02/18 1921  BILITOT 1.1 0.5  AST 39 20  ALT 19 15  ALKPHOS 70 71  PROT 7.4 7.1  ALBUMIN 2.7* 1.9*    TUMOR MARKERS: No results for input(s): AFPTM, CEA, CA199, CHROMGRNA in the last 8760 hours.  Assessment and Plan:  Fluid and gas collection of the left retroperitoneal space, potentially abscess, hematoma, or seroma/lymphocele and flocculent material in the left flank, concerning for abscess.  Will proceed with  image guided drain placement x 2 by Dr. Earleen Newport.  Risks and benefits discussed with the patient including bleeding, infection, damage to adjacent structures, bowel perforation/fistula connection, and sepsis.  All of the patient's questions were answered, patient is agreeable to proceed. Consent signed and in chart.  Thank you for this interesting consult.  I greatly enjoyed meeting Jerry [name] and look forward to participating in their care.  A copy of this report was sent to the requesting provider on this date.  Electronically Signed: Murrell Redden, PA-C   08/04/2018, 9:38 AM      I spent a total of 40 Minutes in face to face in clinical consultation, greater than 50% of which was counseling/coordinating care for drain placement.

## 2018-08-04 NOTE — Sedation Documentation (Signed)
800cc purulent material aspirated from the more lateral L flank drain.

## 2018-08-04 NOTE — Progress Notes (Signed)
   Subjective: Patient evaluated this morning. He denies any issues overnight. He denies any pain or shortness of breath.  Discussed the possibility of needing a blood transfusion and patient declined.  He states he is hungry and wants to eat breakfast.  Objective:  Vital signs in last 24 hours: Vitals:   08/03/18 0436 08/03/18 1330 08/03/18 2246 08/04/18 0352  BP: (!) 92/48 (!) 90/41 (!) 93/39 (!) 96/47  Pulse: 66 65 67 69  Resp: 18 18 18 16   Temp: 97.9 F (36.6 C) 98.1 F (36.7 C) 98.3 F (36.8 C) 97.8 F (36.6 C)  TempSrc:   Oral Oral  SpO2: 97% 96% 97% 94%  Weight: 103.9 kg     Height: 5\' 11"  (1.803 m)      Physical Exam  Constitutional: No distress.  Cardiovascular: Normal rate, regular rhythm and normal heart sounds. Exam reveals no gallop and no friction rub.  No murmur heard. Pulmonary/Chest: Effort normal and breath sounds normal. No respiratory distress. He has no wheezes. He has no rales.  Abdominal: Soft. Bowel sounds are normal. He exhibits no distension. There is no tenderness.  Genitourinary:  Genitourinary Comments: Suprapubic catheter in place without drainage or erythema.  No signs of infection at site  Skin: Skin is warm and dry. He is not diaphoretic.     Assessment/Plan:  Active Problems:   Psoas abscess, left (HCC)  Sepsis secondary to psoas abscess  Leukocytosis continues to improve. On admission it was 38 and is currently 17.  He continues to be afebrile.  He denies any pain.  CT abdomen and pelvis without contrast was ordered yesterday and showed fluid and gas collection of the left retroperitoneal space and flocculent material in the left flank. IR was consulted yesterday for drainage of retroperitoneal abscess.  Blood cultures with no growth to date.   -Continue with current antibiotic regimen including vancomycin, cefepime and Flagyl -IR consulted for abscess drainage  Acute on chronic CKD  Improving, creatinine currently 1.8 baseline appears to  be 1.3.   CT imaging of abdomen and pelvis showed multiple non-obstructing stones of the right collecting system.  Urology was consulted for evaluation of appropriate location of right-sided ureteral stent as there was a question on appropriate location on imaging.   Patient has suprapubic catheter in place without signs of infection. Patient has a history of multiple UTIs and urine culture still pending.   -Appreciate urology recommendations - urine culture pending  Normocytic anemia It appears that patient's baseline hemoglobin is 11, last reported in 10/2017 in our EMR.  Hemoglobin on admission was 7.9 and is currently 7.1.  No signs of bleeding.  PT and PTT were prolonged.  Platelets normal.  Will obtain smear.  Patient is declining blood transfusion at this time. -cbc in am -repeat stat H&H if changes in vitals  Hx of HFpEF Used to be on Lasix PO 20mg  daily at home - Monitor for signs of hypervolemia  Dispo: Anticipated discharge pending abscess drainage, urology evaluation and clinical improvment.   Kalman Shan Henderson, DO 08/04/2018, 9:20 AM Pager: [phone]

## 2018-08-04 NOTE — Consult Note (Signed)
Urology Consult  Referring physician: Dr Dareen Piano Reason for referral: indwelling right ureteral stent, SP tube, sepsis from a urinary source  Chief Complaint: Fatigue and suprapubic pain  History of Present Illness: Jerry [name] is a 72yo with a hx of left nephrectomy, nephrolithiasis s/p right ureteral stent placement, neurogenic bladder managed with an SP tube who was admitted with fatigue. He underwent left nephrectomy in 02/2018 for a nonfunctioning left kidney with a staghorn calculus. The right ureteral stent was placed at that time. He has a chronic SP tube which has been in place for 7 years and was placed after he was unable to urinate after a [place] PVP. He underwent CT which showed a right ureteral stent in place which was curled in the distal ureter. He does have right renal calculi, no hydronephrosis. No ureteral calculi visualized. He denies any LUTS. No right flank pain. He does have left flank pain after a drain was placed for a left psoas abscess.   Past Medical History:  Diagnosis Date  . Acute kidney injury ([place]) 09/21/2017  . Arthritis    "hands, knees, back, legs" (06/10/2014)  . Atonic neurogenic bladder 06/30/2016  . Bilateral leg weakness 10/25/2012  . Cervical myelopathy ([place])   . Chronic diastolic congestive heart failure ([place]) 06/23/2014  . Coma ([place]) 1968   "for 2 wks"  . Fall at home 10/25/2012  . Generalized weakness 06/23/2014  . ZOXWRUEA(540.9)    "monthly" (06/10/2014)  . Lower urinary tract infectious disease 10/25/2012  . Malaria   . Obesity 10/25/2012  . Presence of suprapubic catheter ([place]) 06/17/2014   Due to prostate cancer   . Prostate cancer ([place])   . PTSD (post-traumatic stress disorder)    "I'm all over that now"  . Rhabdomyolysis 09/21/2017  . Sleep apnea 10/25/2012  . Staghorn calculus 06/30/2016  . Staphylococcus aureus bacteremia 06/30/2014  . Urinary incontinence, functional 10/25/2012  . UTI (urinary tract infection) 09/2017   Past Surgical  History:  Procedure Laterality Date  . ANTERIOR CERVICAL DECOMP/DISCECTOMY FUSION  05/2007   Archie Endo 02/16/2011  . BACK SURGERY  2008   cervical and thoracic decompression for myelopathy  . PROSTATE SURGERY  03/2012   "shaved it down; didn't take it off"  . SHRAPNEL REMOVAL  1968   "HEAD, CHEST, LEFT HAND"  . SUPRAPUBIC CATHETER INSERTION    . THORACIC LAMINECTOMY  05/2007   Archie Endo 02/16/2011    Medications: I have reviewed the patient's current medications. Allergies: No Known Allergies  Family History  Problem Relation Age of Onset  . Heart disease Mother   . Dementia Mother   . Heart disease Father   . Dementia Father    Social History:  reports that he has never smoked. He has never used smokeless tobacco. He reports that he does not drink alcohol or use drugs.  Review of Systems  Constitutional: Positive for chills.  Gastrointestinal: Positive for abdominal pain and nausea.  Genitourinary: Positive for flank pain.  All other systems reviewed and are negative.   Physical Exam:  Vital signs in last 24 hours: Temp:  [97.8 F (36.6 C)-98.3 F (36.8 C)] 98 F (36.7 C) (10/20 1505) Pulse Rate:  [66-76] 74 (10/20 1505) Resp:  [16-22] 18 (10/20 1208) BP: (85-96)/(39-53) 91/43 (10/20 1505) SpO2:  [94 %-100 %] 98 % (10/20 1505) Physical Exam  Constitutional: He is oriented to person, place, and time. He appears well-developed and well-nourished.  HENT:  Head: Normocephalic and atraumatic.  Eyes: Pupils are equal,  round, and reactive to light. EOM are normal.  Neck: Normal range of motion. No thyromegaly present.  Cardiovascular: Normal rate and regular rhythm.  Respiratory: Effort normal. No respiratory distress.  GI: Soft. He exhibits no distension. Hernia confirmed negative in the right inguinal area and confirmed negative in the left inguinal area.  Genitourinary: Testes normal and penis normal.  Musculoskeletal: Normal range of motion. He exhibits no edema.   Lymphadenopathy:       Right: No inguinal adenopathy present.       Left: No inguinal adenopathy present.  Neurological: He is alert and oriented to person, place, and time.  Skin: Skin is warm and dry.  Psychiatric: He has a normal mood and affect. His behavior is normal. Judgment and thought content normal.    Laboratory Data:  Results for orders placed or performed during the hospital encounter of 08/02/18 (from the past 72 hour(s))  Culture, blood (routine x 2)     Status: None (Preliminary result)   Collection Time: 08/02/18  7:09 PM  Result Value Ref Range   Specimen Description BLOOD LEFT ANTECUBITAL    Special Requests      BOTTLES DRAWN AEROBIC AND ANAEROBIC Blood Culture results may not be optimal due to an excessive volume of blood received in culture bottles   Culture      NO GROWTH 2 DAYS Performed at [facility] [address] [place], [place] [address]    Report Status PENDING   CBC with Differential     Status: Abnormal   Collection Time: 08/02/18  7:21 PM  Result Value Ref Range   WBC 33.1 (H) 4.0 - 10.5 K/uL   RBC 3.61 (L) 4.22 - 5.81 MIL/uL   Hemoglobin 9.3 (L) 13.0 - 17.0 g/dL   HCT 31.6 (L) 39.0 - 52.0 %   MCV 87.5 80.0 - 100.0 fL   MCH 25.8 (L) 26.0 - 34.0 pg   MCHC 29.4 (L) 30.0 - 36.0 g/dL   RDW 15.2 11.5 - 15.5 %   Platelets 401 (H) 150 - 400 K/uL   nRBC 0.0 0.0 - 0.2 %   Neutrophils Relative % 89 %   Neutro Abs 29.8 (H) 1.7 - 7.7 K/uL   Lymphocytes Relative 4 %   Lymphs Abs 1.3 0.7 - 4.0 K/uL   Monocytes Relative 6 %   Monocytes Absolute 2.0 (H) 0.1 - 1.0 K/uL   Eosinophils Relative 0 %   Eosinophils Absolute 0.0 0.0 - 0.5 K/uL   Basophils Relative 0 %   Basophils Absolute 0.0 0.0 - 0.1 K/uL   nRBC 0 0 /100 WBC   Myelocytes 1 %    Comment: Performed at [facility], [place] [address] [place],  [address]  Comprehensive metabolic panel     Status: Abnormal   Collection Time: 08/02/18  7:21 PM  Result Value Ref Range    Sodium 133 (L) 135 - 145 mmol/L   Potassium 3.5 3.5 - 5.1 mmol/L   Chloride 101 98 - 111 mmol/L   CO2 20 (L) 22 - 32 mmol/L   Glucose, Bld 155 (H) 70 - 99 mg/dL   BUN 35 (H) 8 - 23 mg/dL   Creatinine, Ser 2.35 (H) 0.61 - 1.24 mg/dL   Calcium 10.6 (H) 8.9 - 10.3 mg/dL   Total Protein 7.1 6.5 - 8.1 g/dL   Albumin 1.9 (L) 3.5 - 5.0 g/dL   AST 20 15 - 41 U/L   ALT 15 0 -  44 U/L   Alkaline Phosphatase 71 38 - 126 U/L   Total Bilirubin 0.5 0.3 - 1.2 mg/dL   GFR calc non Af Amer 26 (L) >60 mL/min   GFR calc Af Amer 30 (L) >60 mL/min    Comment: (NOTE) The eGFR has been calculated using the CKD EPI equation. This calculation has not been validated in all clinical situations. eGFR's persistently <60 mL/min signify possible Chronic Kidney Disease.    Anion gap 12 5 - 15    Comment: Performed at [facility] [address], [place], [place] [address]  Culture, blood (routine x 2)     Status: None (Preliminary result)   Collection Time: 08/02/18  7:22 PM  Result Value Ref Range   Specimen Description BLOOD LEFT ANTECUBITAL    Special Requests      BOTTLES DRAWN AEROBIC AND ANAEROBIC Blood Culture adequate volume   Culture      NO GROWTH 2 DAYS Performed at [facility], [place] [address] [place], [place] [address]    Report Status PENDING   I-Stat CG4 Lactic Acid, ED     Status: Abnormal   Collection Time: 08/02/18  7:33 PM  Result Value Ref Range   Lactic Acid, Venous 2.14 (HH) 0.5 - 1.9 mmol/L   Comment NOTIFIED PHYSICIAN   Urinalysis, Routine w reflex microscopic     Status: Abnormal   Collection Time: 08/02/18  8:20 PM  Result Value Ref Range   Color, Urine AMBER (A) YELLOW    Comment: BIOCHEMICALS MAY BE AFFECTED BY COLOR   APPearance TURBID (A) CLEAR   Specific Gravity, Urine 1.015 1.005 - 1.030   pH 6.0 5.0 - 8.0   Glucose, UA NEGATIVE NEGATIVE mg/dL   Hgb urine dipstick LARGE (A) NEGATIVE   Bilirubin Urine NEGATIVE NEGATIVE   Ketones, ur NEGATIVE NEGATIVE  mg/dL   Protein, ur 100 (A) NEGATIVE mg/dL   Nitrite NEGATIVE NEGATIVE   Leukocytes, UA LARGE (A) NEGATIVE   RBC / HPF >50 (H) 0 - 5 RBC/hpf   WBC, UA >50 (H) 0 - 5 WBC/hpf   Bacteria, UA MANY (A) NONE SEEN   Mucus PRESENT     Comment: Performed at [facility], [address] [place], [place] [address]  Urine culture     Status: Abnormal (Preliminary result)   Collection Time: 08/02/18  8:20 PM  Result Value Ref Range   Specimen Description URINE, RANDOM    Special Requests      NONE Performed at [facility], [place] [address] [place], [place] [address]    Culture >=100,000 COLONIES/mL PSEUDOMONAS AERUGINOSA (A)    Report Status PENDING   I-Stat CG4 Lactic Acid, ED     Status: None   Collection Time: 08/02/18  9:01 PM  Result Value Ref Range   Lactic Acid, Venous 1.83 0.5 - 1.9 mmol/L  MRSA PCR Screening     Status: None   Collection Time: 08/03/18  1:33 AM  Result Value Ref Range   MRSA by PCR NEGATIVE NEGATIVE    Comment:        The GeneXpert MRSA Assay (FDA approved for NASAL specimens only), is one component of a comprehensive MRSA colonization surveillance program. It is not intended to diagnose MRSA infection nor to guide or monitor treatment for MRSA infections. Performed at [facility], [place] [address], [place], [place] [address]   Basic metabolic panel     Status: Abnormal  Collection Time: 08/03/18  6:03 AM  Result Value Ref Range   Sodium 138 135 - 145 mmol/L   Potassium 3.5 3.5 - 5.1 mmol/L   Chloride 108 98 - 111 mmol/L   CO2 20 (L) 22 - 32 mmol/L   Glucose, Bld 94 70 - 99 mg/dL   BUN 33 (H) 8 - 23 mg/dL   Creatinine, Ser 2.28 (H) 0.61 - 1.24 mg/dL   Calcium 9.8 8.9 - 10.3 mg/dL   GFR calc non Af Amer 27 (L) >60 mL/min   GFR calc Af Amer 31 (L) >60 mL/min    Comment: (NOTE) The eGFR has been calculated using the CKD EPI equation. This calculation has not been validated in all clinical situations. eGFR's persistently <60 mL/min  signify possible Chronic Kidney Disease.    Anion gap 10 5 - 15    Comment: Performed at [facility] [address], [place], [place] [address]  CBC     Status: Abnormal   Collection Time: 08/03/18  6:03 AM  Result Value Ref Range   WBC 23.2 (H) 4.0 - 10.5 K/uL   RBC 3.03 (L) 4.22 - 5.81 MIL/uL   Hemoglobin 7.9 (L) 13.0 - 17.0 g/dL   HCT 26.8 (L) 39.0 - 52.0 %   MCV 88.4 80.0 - 100.0 fL   MCH 26.1 26.0 - 34.0 pg   MCHC 29.5 (L) 30.0 - 36.0 g/dL   RDW 15.5 11.5 - 15.5 %   Platelets 304 150 - 400 K/uL   nRBC 0.0 0.0 - 0.2 %    Comment: Performed at [facility], [place] [address], [place], [place] [address]  Protime-INR     Status: Abnormal   Collection Time: 08/03/18  6:03 AM  Result Value Ref Range   Prothrombin Time 16.6 (H) 11.4 - 15.2 seconds   INR 1.36     Comment: Performed at [facility] [address] [place], [place] [address]  APTT     Status: Abnormal   Collection Time: 08/03/18  6:03 AM  Result Value Ref Range   aPTT 43 (H) 24 - 36 seconds    Comment:        IF BASELINE aPTT IS ELEVATED, SUGGEST PATIENT RISK ASSESSMENT BE USED TO DETERMINE APPROPRIATE ANTICOAGULANT THERAPY. Performed at [facility], [place] [address], [place], [place] [address]   T4, free     Status: None   Collection Time: 08/03/18  6:03 AM  Result Value Ref Range   Free T4 0.86 0.82 - 1.77 ng/dL    Comment: (NOTE) Biotin ingestion may interfere with free T4 tests. If the results are inconsistent with the TSH level, previous test results, or the clinical presentation, then consider biotin interference. If needed, order repeat testing after stopping biotin. Performed at [facility], [place] [address] [place], [place] [address]   Glucose, capillary     Status: None   Collection Time: 08/03/18 11:57 AM  Result Value Ref Range   Glucose-Capillary 93 70 - 99 mg/dL  CBC     Status: Abnormal   Collection Time: 08/03/18  4:37 PM  Result Value Ref Range   WBC 20.2  (H) 4.0 - 10.5 K/uL   RBC 2.76 (L) 4.22 - 5.81 MIL/uL   Hemoglobin 7.3 (L) 13.0 - 17.0 g/dL   HCT 24.1 (L) 39.0 - 52.0 %   MCV 87.3 80.0 - 100.0 fL   MCH 26.4 26.0 - 34.0 pg  MCHC 30.3 30.0 - 36.0 g/dL   RDW 15.6 (H) 11.5 - 15.5 %   Platelets 274 150 - 400 K/uL   nRBC 0.0 0.0 - 0.2 %    Comment: Performed at [facility], [place] [address] [place], [place] [address]  CBC     Status: Abnormal   Collection Time: 08/04/18  3:53 AM  Result Value Ref Range   WBC 17.4 (H) 4.0 - 10.5 K/uL   RBC 2.80 (L) 4.22 - 5.81 MIL/uL   Hemoglobin 7.1 (L) 13.0 - 17.0 g/dL   HCT 24.3 (L) 39.0 - 52.0 %   MCV 86.8 80.0 - 100.0 fL   MCH 25.4 (L) 26.0 - 34.0 pg   MCHC 29.2 (L) 30.0 - 36.0 g/dL   RDW 15.5 11.5 - 15.5 %   Platelets 301 150 - 400 K/uL   nRBC 0.0 0.0 - 0.2 %    Comment: Performed at [facility], [place] [address], [place], [place] [address]  Basic metabolic panel     Status: Abnormal   Collection Time: 08/04/18  3:53 AM  Result Value Ref Range   Sodium 138 135 - 145 mmol/L   Potassium 2.8 (L) 3.5 - 5.1 mmol/L    Comment: DELTA CHECK NOTED   Chloride 111 98 - 111 mmol/L   CO2 20 (L) 22 - 32 mmol/L   Glucose, Bld 90 70 - 99 mg/dL   BUN 28 (H) 8 - 23 mg/dL   Creatinine, Ser 1.88 (H) 0.61 - 1.24 mg/dL   Calcium 9.6 8.9 - 10.3 mg/dL   GFR calc non Af Amer 34 (L) >60 mL/min   GFR calc Af Amer 39 (L) >60 mL/min    Comment: (NOTE) The eGFR has been calculated using the CKD EPI equation. This calculation has not been validated in all clinical situations. eGFR's persistently <60 mL/min signify possible Chronic Kidney Disease.    Anion gap 7 5 - 15    Comment: Performed at [facility] [address] [place], [place] [address]  Magnesium     Status: None   Collection Time: 08/04/18  3:53 AM  Result Value Ref Range   Magnesium 1.8 1.7 - 2.4 mg/dL    Comment: Performed at [facility] [address] [place], [place] [address]  Glucose, capillary     Status:  Abnormal   Collection Time: 08/04/18  8:40 AM  Result Value Ref Range   Glucose-Capillary 102 (H) 70 - 99 mg/dL  Save Smear     Status: None   Collection Time: 08/04/18 10:59 AM  Result Value Ref Range   Smear Review SMEAR STAINED AND AVAILABLE FOR REVIEW     Comment: Performed at [facility], [place] [address] [place], [place] [address]  Iron and TIBC     Status: Abnormal   Collection Time: 08/04/18 10:59 AM  Result Value Ref Range   Iron 15 (L) 45 - 182 ug/dL   TIBC 146 (L) 250 - 450 ug/dL   Saturation Ratios 10 (L) 17.9 - 39.5 %   UIBC 131 ug/dL    Comment: Performed at [facility] [address] [place], [place] [address]  Ferritin     Status: Abnormal   Collection Time: 08/04/18 10:59 AM  Result Value Ref Range   Ferritin 565 (H) 24 - 336 ng/mL    Comment: Performed at [facility], [place] [address], [place], [place] [address]  Aerobic/Anaerobic  Culture (surgical/deep wound)     Status: None (Preliminary result)   Collection Time: 08/04/18 12:22 PM  Result Value Ref Range   Specimen Description ABSCESS ABDOMEN    Special Requests NONE    Gram Stain      MODERATE WBC PRESENT,BOTH PMN AND MONONUCLEAR ABUNDANT GRAM NEGATIVE RODS MODERATE GRAM POSITIVE COCCI IN CHAINS FEW GRAM POSITIVE RODS Performed at [facility], [place] [address], [place], [place] [address]    Culture PENDING    Report Status PENDING   Aerobic/Anaerobic Culture (surgical/deep wound)     Status: None (Preliminary result)   Collection Time: 08/04/18 12:22 PM  Result Value Ref Range   Specimen Description ABSCESS LEFT FLANK    Special Requests NONE    Gram Stain      MODERATE WBC PRESENT,BOTH PMN AND MONONUCLEAR MODERATE GRAM POSITIVE COCCI IN CHAINS FEW GRAM POSITIVE COCCI IN CLUSTERS MODERATE GRAM NEGATIVE RODS FEW GRAM POSITIVE RODS Performed at [facility], [place] [address], [place], [place] [address]    Culture PENDING    Report Status PENDING    Recent Results  (from the past 240 hour(s))  Culture, blood (routine x 2)     Status: None (Preliminary result)   Collection Time: 08/02/18  7:09 PM  Result Value Ref Range Status   Specimen Description BLOOD LEFT ANTECUBITAL  Final   Special Requests   Final    BOTTLES DRAWN AEROBIC AND ANAEROBIC Blood Culture results may not be optimal due to an excessive volume of blood received in culture bottles   Culture   Final    NO GROWTH 2 DAYS Performed at [facility] [address], [place], [place] [address]    Report Status PENDING  Incomplete  Culture, blood (routine x 2)     Status: None (Preliminary result)   Collection Time: 08/02/18  7:22 PM  Result Value Ref Range Status   Specimen Description BLOOD LEFT ANTECUBITAL  Final   Special Requests   Final    BOTTLES DRAWN AEROBIC AND ANAEROBIC Blood Culture adequate volume   Culture   Final    NO GROWTH 2 DAYS Performed at [facility], [place] [address], [place], [place] [address]    Report Status PENDING  Incomplete  Urine culture     Status: Abnormal (Preliminary result)   Collection Time: 08/02/18  8:20 PM  Result Value Ref Range Status   Specimen Description URINE, RANDOM  Final   Special Requests   Final    NONE Performed at [facility], [place] [address] [place], [place] [address]    Culture >=100,000 COLONIES/mL PSEUDOMONAS AERUGINOSA (A)  Final   Report Status PENDING  Incomplete  MRSA PCR Screening     Status: None   Collection Time: 08/03/18  1:33 AM  Result Value Ref Range Status   MRSA by PCR NEGATIVE NEGATIVE Final    Comment:        The GeneXpert MRSA Assay (FDA approved for NASAL specimens only), is one component of a comprehensive MRSA colonization surveillance program. It is not intended to diagnose MRSA infection nor to guide or monitor treatment for MRSA infections. Performed at [facility], [place] [address], [place], [place] [address]   Aerobic/Anaerobic Culture (surgical/deep wound)     Status:  None (Preliminary result)   Collection Time: 08/04/18 12:22 PM  Result Value Ref Range Status   Specimen Description ABSCESS ABDOMEN  Final   Special  Requests NONE  Final   Gram Stain   Final    MODERATE WBC PRESENT,BOTH PMN AND MONONUCLEAR ABUNDANT GRAM NEGATIVE RODS MODERATE GRAM POSITIVE COCCI IN CHAINS FEW GRAM POSITIVE RODS Performed at [facility], [address], [place], [place] [address]    Culture PENDING  Incomplete   Report Status PENDING  Incomplete  Aerobic/Anaerobic Culture (surgical/deep wound)     Status: None (Preliminary result)   Collection Time: 08/04/18 12:22 PM  Result Value Ref Range Status   Specimen Description ABSCESS LEFT FLANK  Final   Special Requests NONE  Final   Gram Stain   Final    MODERATE WBC PRESENT,BOTH PMN AND MONONUCLEAR MODERATE GRAM POSITIVE COCCI IN CHAINS FEW GRAM POSITIVE COCCI IN CLUSTERS MODERATE GRAM NEGATIVE RODS FEW GRAM POSITIVE RODS Performed at [facility], [place] [address] [place], [place] [address]    Culture PENDING  Incomplete   Report Status PENDING  Incomplete   Creatinine: Recent Labs    08/02/18 1921 08/03/18 0603 08/04/18 0353  CREATININE 2.35* 2.28* 1.88*   Baseline Creatinine: 1.6  Impression/Assessment:  72yo with nephrolithiasis, neurogenic bladder  Plan:  1. Nephrolithiasis: The right ureteral stent has migrated into the distal ureter but there is no hydronephrosis and the patient has good urine output. He does not require further intervention from a Urology standpoint at this time. We will get his scheduled to see me in the office and plan for a right ureteroscopic stone extraction as an outpatient. 2. Neurogenic bladder: The patient's bladder has been maintained with a chronic SP tube. The patient should continue monthly SP tube changes  Nicolette Bang 08/04/2018, 6:58 PM

## 2018-08-05 DIAGNOSIS — E876 Hypokalemia: Secondary | ICD-10-CM

## 2018-08-05 DIAGNOSIS — B965 Pseudomonas (aeruginosa) (mallei) (pseudomallei) as the cause of diseases classified elsewhere: Secondary | ICD-10-CM

## 2018-08-05 DIAGNOSIS — I5030 Unspecified diastolic (congestive) heart failure: Secondary | ICD-10-CM

## 2018-08-05 LAB — BASIC METABOLIC PANEL
Anion gap: 5 (ref 5–15)
BUN: 25 mg/dL — AB (ref 8–23)
CALCIUM: 9.4 mg/dL (ref 8.9–10.3)
CO2: 19 mmol/L — ABNORMAL LOW (ref 22–32)
CREATININE: 1.6 mg/dL — AB (ref 0.61–1.24)
Chloride: 112 mmol/L — ABNORMAL HIGH (ref 98–111)
GFR calc Af Amer: 47 mL/min — ABNORMAL LOW (ref >60)
GFR, EST NON AFRICAN AMERICAN: 41 mL/min — AB (ref >60)
Glucose, Bld: 91 mg/dL (ref 70–99)
POTASSIUM: 3.2 mmol/L — AB (ref 3.5–5.1)
SODIUM: 136 mmol/L (ref 135–145)

## 2018-08-05 LAB — CBC
HCT: 26.7 % — ABNORMAL LOW (ref 39.0–52.0)
Hemoglobin: 7.9 g/dL — ABNORMAL LOW (ref 13.0–17.0)
MCH: 26.2 pg (ref 26.0–34.0)
MCHC: 29.6 g/dL — ABNORMAL LOW (ref 30.0–36.0)
MCV: 88.4 fL (ref 80.0–100.0)
Platelets: 295 10*3/uL (ref 150–400)
RBC: 3.02 MIL/uL — AB (ref 4.22–5.81)
RDW: 15.6 % — AB (ref 11.5–15.5)
WBC: 16 10*3/uL — ABNORMAL HIGH (ref 4.0–10.5)
nRBC: 0 % (ref 0.0–0.2)

## 2018-08-05 LAB — GLUCOSE, CAPILLARY: GLUCOSE-CAPILLARY: 85 mg/dL (ref 70–99)

## 2018-08-05 MED ORDER — POTASSIUM CHLORIDE CRYS ER 20 MEQ PO TBCR
40.0000 meq | EXTENDED_RELEASE_TABLET | Freq: Two times a day (BID) | ORAL | Status: AC
  Administered 2018-08-05 (×2): 40 meq via ORAL
  Filled 2018-08-05 (×2): qty 2

## 2018-08-05 MED ORDER — BOOST / RESOURCE BREEZE PO LIQD CUSTOM
1.0000 | Freq: Three times a day (TID) | ORAL | Status: DC
  Administered 2018-08-07 – 2018-08-11 (×3): 1 via ORAL

## 2018-08-05 NOTE — Progress Notes (Addendum)
Referring Physician(s): Violet Baldy  Supervising Physician: Markus Daft  Patient Status:  [facility] - In-pt  Chief Complaint: Retroperitoneal abscess   Subjective: Pt doing ok; no acute changes; still has some back/LLQ discomfort   Allergies: Patient has no known allergies.  Medications: Prior to Admission medications   Medication Sig Start Date End Date Taking? Authorizing Provider  acetaminophen (TYLENOL) 325 MG tablet Take 650 mg by mouth 3 (three) times daily.   Yes [provider]  polyethylene glycol (MIRALAX) packet Take 17 g by mouth 2 (two) times daily. Patient not taking: Reported on 08/02/2018 09/26/17   Modena Jansky, MD  senna (SENOKOT) 8.6 MG TABS tablet Take 2 tablets (17.2 mg total) by mouth at bedtime as needed for mild constipation or moderate constipation. Patient not taking: Reported on 08/02/2018 09/26/17   Modena Jansky, MD  vitamin B-12 (CYANOCOBALAMIN) 1000 MCG tablet Take 2 tablets (2,000 mcg total) by mouth daily. Patient not taking: Reported on 08/02/2018 09/26/17   Modena Jansky, MD     Vital Signs: BP (!) 91/53 (BP Location: Right Arm)   Pulse 71   Temp 98 F (36.7 C) (Oral)   Resp 20   Ht 5\' 11"  (1.803 m)   Wt 228 lb 13.4 oz (103.8 kg)   SpO2 96%   BMI 31.92 kg/m   Physical Exam left RP/lat abd drains intact; dressings dry;  outputs 140-160 cc brown fluid/air mix; JP bulb on one drain not holding suction, therefore changed to gravity bag(drain in air/fluid collection); both drains irrigated without difficulty  Imaging: Ct Abdomen Pelvis Wo Contrast  Result Date: 08/04/2018 CLINICAL DATA:  72 year old male with a history of retroperitoneal abscess. EXAM: CT ABDOMEN AND PELVIS WITHOUT CONTRAST TECHNIQUE: Multidetector CT imaging of the abdomen and pelvis was performed following the standard protocol without IV contrast. COMPARISON:  No prior CT. Plain film 08/03/2018, CT 02/23/2018, 02/23/2018, 01/01/2018 FINDINGS: Lower  chest: Atelectasis of the inferior lungs. Coronary artery disease and valve calcifications. Hepatobiliary: Unremarkable appearance of the liver. Cholelithiasis. No pericholecystic fluid or inflammatory changes. Pancreas: Unremarkable Spleen: Unremarkable Adrenals/Urinary Tract: Unremarkable adrenal glands. Double-J right ureteral stent with the proximal loop formed in the collecting system and the distal loop formed in the distal ureter. Nephrolithiasis of the right collecting system measuring 15 mm, 10 mm, 15 mm. No perinephric stranding on the right. Suprapubic catheter within the urinary bladder with bladder decompressed. Surgical changes of left nephrectomy. Fluid and gas collection in the left retroperitoneal space, continuous with the psoas muscle medially and the lateral abdominal wall laterally measuring 111 mm by 75 mm on the axial images. Flocculent material at the superior aspect of the fluid collection. The left colon is displaced anteriorly. Uncertain if there is a connection between left colon and the abscess cavity on this CT image. Flocculent material within the left-sided flank measures 138 mm with no definite connection to the retroperitoneal space. Stomach/Bowel: Hiatal hernia. Unremarkable stomach. Small bowel partially distended with air-fluid levels. No transition point. Moderate stool burden. No significantly distended colon. Colonic diverticular change without associated inflammatory changes. Appendix is not visualized, however, no inflammatory changes are present adjacent to the cecum to indicate an appendicitis. Vascular/Lymphatic: Calcifications of the abdominal aorta. No aneurysm. Calcifications of the iliac arteries. Reproductive: Unremarkable prostate. Other: None Musculoskeletal: Multilevel degenerative changes of the visualized thoracolumbar spine. No acute displaced fracture. Degenerative changes of bilateral hips. Surgical changes of thoracic laminectomy IMPRESSION: Fluid and gas  collection of the left retroperitoneal space,  potentially abscess, hematoma, or seroma/lymphocele. Status post left nephrectomy. Flocculent material in the left flank, concerning for abscess. Right-sided ureteral stent with the proximal loop formed in the collecting system and the distal loop formed in the distal ureter. Referral for follow-up urologic evaluation is recommended. Multiple nonobstructing stones of the right collecting system. Cholelithiasis without evidence of acute inflammatory changes. Diverticular disease without evidence of acute diverticulitis. Moderate stool burden with no evidence of obstruction. Aortic Atherosclerosis (ICD10-I70.0). Electronically Signed   By: Corrie Mckusick D.O.   On: 08/04/2018 08:37   Dg Abd 1 View  Result Date: 08/03/2018 CLINICAL DATA:  Chronic generalized abdominal pain. Ureteral stent displacement. EXAM: ABDOMEN - 1 VIEW COMPARISON:  02/26/2018 FINDINGS: Nonobstructive/nonspecific bowel gas pattern. Paucity of gas in the left abdomen with gas collection overlying the left flank. Right-sided ureteral stent with proximal tip in the expected location of the right renal pelvis and distal tip in the right pelvis. IMPRESSION: Unchanged positioning of the right ureteral stent. Nonspecific bowel gas pattern. Gas collection overlies the left flank. Electronically Signed   By: Fidela Salisbury M.D.   On: 08/03/2018 17:34   Dg Chest Port 1 View  Result Date: 08/02/2018 CLINICAL DATA:  Abscess EXAM: PORTABLE CHEST 1 VIEW COMPARISON:  09/21/2017, 09/17/2017 FINDINGS: Low lung volumes. Elevation of the right diaphragm. No focal opacity or pleural effusion. Rounded enlargement of the right hilus. IMPRESSION: 1. Rounded enlargement of right hilus, could be secondary to elevated diaphragm and crowding of the central pulmonary vessels, however hilar nodes or mass are not excluded. Correlation with contrast-enhanced CT is suggested. 2. No focal pulmonary opacity.  Electronically Signed   By: Donavan Foil M.D.   On: 08/02/2018 20:34   Ct Image Guided Drainage By Percutaneous Catheter  Result Date: 08/04/2018 INDICATION: 72 year old male with retroperitoneal abscess EXAM: CT-GUIDED FLANK ABSCESS DRAINAGE CT-GUIDED LEFT RETROPERITONEAL ABSCESS DRAINAGE MEDICATIONS: The patient is currently admitted to the hospital and receiving intravenous antibiotics. The antibiotics were administered within an appropriate time frame prior to the initiation of the procedure. ANESTHESIA/SEDATION: Fentanyl 2.0 mcg IV; Versed 100 mg IV Moderate Sedation Time:  22 minutes The patient was continuously monitored during the procedure by the interventional radiology nurse under my direct supervision. COMPLICATIONS: None PROCEDURE: Informed written consent was obtained from the patient after a thorough discussion of the procedural risks, benefits and alternatives. All questions were addressed. Maximal Sterile Barrier Technique was utilized including caps, mask, sterile gowns, sterile gloves, sterile drape, hand hygiene and skin antiseptic. A timeout was performed prior to the initiation of the procedure. Patient position prone position on CT gantry table. Scout CT of the abdomen was performed for planning purposes. The patient was then prepped and draped in the usual sterile fashion. 1% lidocaine was used for local anesthesia. Using modified Seldinger technique, coaxial trocar needle placed into the flank abscess in the retroperitoneal abscess. Twelve French drain was placed within the retroperitoneal abscess in the flank abscess. Approximately 30 cc of purulent material aspirated from the flank abscess with a sample sent for culture. Approximately 800 cc of purulent material aspirated from the retroperitoneal abscess. Sample sent to the lab for analysis. Both Reamer attached to bulb suction. Sterile dressings were placed. Patient tolerated the procedure well and remained hemodynamically stable  throughout. No complications were encountered and no significant blood loss. FINDINGS: After drainage, there is flocculent material persisting within the flank. After drainage, there is significant reduction in the fluid abscess of the retroperitoneum with small gas persisting. IMPRESSION:  Status post CT-guided drainage of left retroperitoneal abscess with a 12 French drain as well as a left-sided flank abscess with a 12 [place] drain. Signed, Dulcy Fanny. Dellia Nims, RPVI [facility] Electronically Signed   By: Corrie Mckusick D.O.   On: 08/04/2018 13:53    Labs:  CBC: Recent Labs    08/03/18 0603 08/03/18 1637 08/04/18 0353 08/05/18 0525  WBC 23.2* 20.2* 17.4* 16.0*  HGB 7.9* 7.3* 7.1* 7.9*  HCT 26.8* 24.1* 24.3* 26.7*  PLT 304 274 301 295    COAGS: Recent Labs    08/03/18 0603  INR 1.36  APTT 43*    BMP: Recent Labs    08/02/18 1921 08/03/18 0603 08/04/18 0353 08/05/18 0525  NA 133* 138 138 136  K 3.5 3.5 2.8* 3.2*  CL 101 108 111 112*  CO2 20* 20* 20* 19*  GLUCOSE 155* 94 90 91  BUN 35* 33* 28* 25*  CALCIUM 10.6* 9.8 9.6 9.4  CREATININE 2.35* 2.28* 1.88* 1.60*  GFRNONAA 26* 27* 34* 41*  GFRAA 30* 31* 39* 47*    LIVER FUNCTION TESTS: Recent Labs    09/21/17 0743 08/02/18 1921  BILITOT 1.1 0.5  AST 39 20  ALT 19 15  ALKPHOS 70 71  PROT 7.4 7.1  ALBUMIN 2.7* 1.9*    Assessment and Plan: S/p drainage of left RP/left lat abd abscesses 10/20; afebrile; WBC 16(17.4), hgb 7.9(7.1), creat 1.6(1.8); fluid cx pend; cont drain irrigations; once output minimal obtain f/u CT to assess adequacy of drainage; may also require drain injections prior to removal since appearance of fluid is somewhat feculent   Electronically Signed: D. Rowe Robert, PA-C 08/05/2018, 12:53 PM   I spent a total of 20 minutes at the the patient's bedside AND on the patient's hospital floor or unit, greater than 50% of which was  counseling/coordinating care for left retroperitoneal abscess drains    Patient ID: Clydene Fake, male   DOB: 1946-04-25, 72 y.o.   MRN: [id number]

## 2018-08-05 NOTE — Progress Notes (Addendum)
   Subjective: [name] reported feeling the same this morning after having IR drain his abscess. He said he did not feel any different. He does not feel as if his weakness has improved and complained of back pain due to rods in his back that were aggravated by the procedure according to him. He also complained of some abdominal pain in the LLQ/slightly above and laterally to catheter site.   Objective:  Vital signs in last 24 hours: Vitals:   08/04/18 1438 08/04/18 1505 08/04/18 2225 08/05/18 0529  BP: (!) 90/45 (!) 91/43 (!) 124/109 (!) 91/53  Pulse: 73 74 71 71  Resp:   (!) 24 20  Temp: 98.1 F (36.7 C) 98 F (36.7 C) 98 F (36.7 C) 98 F (36.7 C)  TempSrc: Oral Oral Oral Oral  SpO2: 97% 98% 99% 96%  Weight:      Height:       Physical Exam  Constitutional: He is oriented to person, place, and time and well-developed, well-nourished, and in no distress.  Cardiovascular: Normal rate, regular rhythm and normal heart sounds.  No murmur heard. Pulmonary/Chest: Effort normal and breath sounds normal.  Abdominal: Soft. Bowel sounds are normal. He exhibits no distension. There is tenderness.  Tender in LLQ, slightly above and lateral to suprapubic catheter site   Musculoskeletal: He exhibits no edema.  Neurological: He is alert and oriented to person, place, and time.  Skin: Skin is warm and dry.    Assessment/Plan:  Active Problems:   Psoas abscess, left (HCC)  Sepsis secondary to psoas abscess  Leukocytosis continues to improve. On admission it was 49 and is currently 16.  He continues to be afebrile. IR drained 800 cc of purulent fluid and placed two drains in the retroperitoneal abscess yesterday.  Blood cultures with no growth to date.   -Continue with current antibiotic regimen including vancomycin, cefepime and Flagyl -f/u abscess cultures; may need to replace suprapubic catheter depending on cultures   Acute on chronic CKD  UTI Improving, creatinine 1.6 baseline  appears to be 1.3.  CT imaging of abdomen and pelvis showed multiple non-obstructing stones of the right collecting system.  Urology was consulted for evaluation of appropriate location of right-sided ureteral stent as there was a question on appropriate location on imaging.   Patient has suprapubic catheter in place without signs of infection. UTI grew Pseudomonas. Will continue with cefepime as above. -Appreciate urology recommendations  Normocytic anemia It appears that patient's baseline hemoglobin is 11, last reported in 10/2017 in our EMR.  Hemoglobin on admission was 7.9 and decreased to 7.1. Today it is 7.9 again.  No signs of bleeding.  PT and PTT were prolonged.  Platelets normal.  Patient is declining blood transfusion yesterday. Will continue to monitor -cbc in am -repeat stat H&H if changes in vitals  Hypokalemia - K 3.2 today, repleted orally  - f/u am bmp  Hx of HFpEF Used to be on Lasix PO 20mg  daily at home - Monitor for signs of hypervolemia  Dispo: Anticipated discharge pending clinical improvement, drains and urology evaluation.   Jerry Craze, DO 08/05/2018, 6:52 AM Pager: [phone]

## 2018-08-05 NOTE — Consult Note (Signed)
   [facility] Inpatient Consult   08/05/2018  Jerry [name] 09/15/1946 [id number]  Patient screened for post hospital follow up needs  with patient's Medicare plan.   Patient had a history with [employer] social workers in the past regarding needs for long term care.  Patient is currently long term resident at [facility]. For questions contact:   Natividad Brood, RN BSN [facility] Liaison  [phone] business mobile phone Toll free office [phone]

## 2018-08-05 NOTE — Progress Notes (Signed)
Internal Medicine Attending:   I saw and examined the patient. I reviewed the resident's note and I agree with the resident's findings and plan as documented in the resident's note.  Patient complains of some pain in his back.  He denies any improvement in his lower extremity weakness.  Patient was initially admitted to the hospital secondary to a large retroperitoneal abscess noted on outpatient imaging.  Patient is now status post drainage of approximately 800 cc of purulent fluid from his retroperitoneal abscess as well as 30 cc of purulent drainage from his left flank abscess.  Drains left in place by IR.  IR follow-up and recommendations appreciated.  We will follow-up cultures from the procedure.  The Gram stain was showing gram-positive cocci and gram-negative rods.  We will continue with cefepime, vancomycin and metronidazole for now.  Patient's creatinine is slowly improving and is now down to 1.6.  His leukocytosis is resolving as well and is now down to 16.  We will continue to monitor him closely.  Urology follow-up and recommendations appreciated.  No acute urological intervention required at this time.  Patient to follow-up with urology as an outpatient for right ureteroscopic stone extraction.  Maintain suprapubic catheter with monthly catheter changes.

## 2018-08-05 NOTE — Progress Notes (Signed)
   08/05/18 1200  Clinical Encounter Type  Visited With Patient and family together  Visit Type Initial  Referral From Other (Comment)  Spiritual Encounters  Spiritual Needs Emotional  Stress Factors  Patient Stress Factors None identified  Family Stress Factors None identified   While doing rounds, I noticed PT and family inviting me in. PT was alert and family was at bedside. I offered spiritual care with words of encouragement and ministry of presence. Chaplain available as needed.   Chaplain Fidel Levy [phone]

## 2018-08-05 NOTE — Discharge Summary (Deleted)
Name: Jerry [name] MRN: [id number] DOB: Jun 28, 1946 72 y.o. PCP: Aura Dials, MD  Date of Admission: 08/02/2018  6:20 PM Date of Discharge: 08/20/2018 Attending Physician: Axel Filler, *  Discharge Diagnosis: 1. Left retroperitoneal abscess 2. Malnutrition 3. Nephrolithiasis  4. Hematuria 5. Hyponatremia 6. Normocytic Anemia  Discharge Medications: Allergies as of 08/20/2018   No Known Allergies     Medication List    TAKE these medications   acetaminophen 325 MG tablet Commonly known as:  TYLENOL Take 650 mg by mouth 3 (three) times daily.   amoxicillin-clavulanate 875-125 MG tablet Commonly known as:  AUGMENTIN Take 1 tablet by mouth 2 (two) times daily. Start taking on:  09/16/2018   piperacillin-tazobactam 3.375 GM/50ML IVPB Commonly known as:  ZOSYN Inject 50 mLs (3.375 g total) into the vein every 8 (eight) hours for 26 days.   polyethylene glycol packet Commonly known as:  MIRALAX / GLYCOLAX Take 17 g by mouth 2 (two) times daily.   senna 8.6 MG Tabs tablet Commonly known as:  SENOKOT Take 2 tablets (17.2 mg total) by mouth at bedtime as needed for mild constipation or moderate constipation.   vitamin B-12 1000 MCG tablet Commonly known as:  CYANOCOBALAMIN Take 2 tablets (2,000 mcg total) by mouth daily.       Disposition and follow-up:   Jerry [name] was discharged from [facility] in Stable condition.  At the hospital follow up visit please address:  1.  Left retroperitoneal abscess with IR drain placement- please assess drain output and continue to perform daily flushes with 5-10 cc of normal saline and daily output recordings. He is to follow up with [facility] in ~2 weeks.   2.  Labs / imaging needed at time of follow-up: none  3.  Pending labs/ test needing follow-up: none  Follow-up Appointments: Follow-up Information    Corrie Mckusick, DO Follow up in 2 week(s).   Specialties:  Interventional  Radiology, Radiology Why:  follow up with Dr Earleen Newport 2 weeks-- pt will hear from scheduler for time and date; call [phone] if needs Contact information: [address] [place] [address] [phone]           Hospital Course by problem list: 1. Sepsis secondary to left retroperitoneal and psoas abscess -   Patient was recently seen by his PCP at the [facility] and a CT abdomen pelvis showed left retroperitoneal/psoas abscess measuring 6.9 x 8.8 x 17 cm in size and she asked him to come to the ED for further evaluation. Patient reported he had worsening weakness, appetite, subjective fevers, chills and increased lethargy. He was found to have sepsis secondary to large retroperitoneal abscess.  Also found to have a UTI growing Pseudomonas.  IR drained 800 cc of purulent fluid from left-sided retroperitoneal abscess and placed 2 drains.  Patient was started on cefepime, vancomycin and Flagyl.  Cultures from abscess showed proteus mirabilis and klebsiella pneum and therapy was switched to Unasyn per ID. Urine culture showed staph and enterococcus as well; likely colonization so it was decided to not treat UTI unless s/sx worsened. There was concern for a colonic fistula as the possible source; drainage from the drains appeared feculent in nature. MRI of abdomen and showed no evidence of fistula with persistent large fluid collection. Repeat CT abdomen pelvis showed persistent large fluid collection with well positioned catheters. He was treated with 13 days of Unasyn and drain output remained significant. Plan to discharge with  daily flushes of JP drains with 5-10 cc of saline and to record daily drain output and follow up with [facility] radiology in 2 weeks for drain evaluation and repeat CT. He had a tunneled chest wall catheter placed on 11/4 for long term abx therapy. Switched to pip/tazo every 8 hours at discharge for a total of 6 weeks of treatment, until December 1st, then convert to  amox/clav 875 mg bid for additional 1-2 months.  Patient's facility reported they do not take care of tunneled catheters. Patient agreed to PICC line placement.   2. Normocytic anemia- also noted to be anemic with hemoglobin of 7.9 on admission.  This decreased to 7 but patient denied blood transfusion at that time.  No signs or symptoms of active bleeding.  Repeat CBC showed hemoglobin to be 7.9 which remained stable; most likely dilutional 2/2 IVFs.  3. Malnutrition- patient had a decreased appetite since admission due to nausea however refused anti-nausea medications. Diet/Nutrition was consulted and added a few supplements to help patient's diet and nutritional status.    4. Hematuria/Nephrolithiasis- patient was found to have hematuria during his hospital stay. Urology was consulted for evaluation of suprapubic catheter and continued hematuria; suggested hematuria is likely related to bladder irritation from stent or SP tube. Recommended outpatient renal calculus extraction and stent removal once abscess has improved and resolved.   5. Hyponatremia- patient was noted to have mild hyponatremia most likely in the setting of decreased appetite. He was given IVF's with improvement.    Discharge Vitals:   BP 104/60 (BP Location: Right Arm)   Pulse 74   Temp 97.6 F (36.4 C) (Oral)   Resp 16   Ht 5\' 11"  (1.803 m)   Wt 100.6 kg   SpO2 98%   BMI 30.93 kg/m   Pertinent Labs, Studies, and Procedures:   CBC Latest Ref Rng & Units 08/18/2018 08/15/2018 08/14/2018  WBC 4.0 - 10.5 K/uL 7.8 10.8(H) 11.7(H)  Hemoglobin 13.0 - 17.0 g/dL 8.5(L) 8.4(L) 8.3(L)  Hematocrit 39.0 - 52.0 % 29.2(L) 29.5(L) 29.2(L)  Platelets 150 - 400 K/uL 355 433(H) 406(H)   CMP Latest Ref Rng & Units 08/19/2018 08/18/2018 08/17/2018  Glucose 70 - 99 mg/dL 74 72 73  BUN 8 - 23 mg/dL 8 8 9   Creatinine 0.61 - 1.24 mg/dL 0.88 0.96 1.06  Sodium 135 - 145 mmol/L 136 135 132(L)  Potassium 3.5 - 5.1 mmol/L 4.0 3.3(L) 3.5    Chloride 98 - 111 mmol/L 108 104 103  CO2 22 - 32 mmol/L 25 25 25   Calcium 8.9 - 10.3 mg/dL 8.4(L) 8.4(L) 8.7(L)  Total Protein 6.5 - 8.1 g/dL - - -  Total Bilirubin 0.3 - 1.2 mg/dL - - -  Alkaline Phos 38 - 126 U/L - - -  AST 15 - 41 U/L - - -  ALT 0 - 44 U/L - - -     Ct Abdomen Pelvis Wo Contrast  Result Date: 08/04/2018 CLINICAL DATA:  72 year old male with a history of retroperitoneal abscess. EXAM: CT ABDOMEN AND PELVIS WITHOUT CONTRAST TECHNIQUE: Multidetector CT imaging of the abdomen and pelvis was performed following the standard protocol without IV contrast. COMPARISON:  No prior CT. Plain film 08/03/2018, CT 02/23/2018, 02/23/2018, 01/01/2018 FINDINGS: Lower chest: Atelectasis of the inferior lungs. Coronary artery disease and valve calcifications. Hepatobiliary: Unremarkable appearance of the liver. Cholelithiasis. No pericholecystic fluid or inflammatory changes. Pancreas: Unremarkable Spleen: Unremarkable Adrenals/Urinary Tract: Unremarkable adrenal glands. Double-J right ureteral stent with the proximal loop  formed in the collecting system and the distal loop formed in the distal ureter. Nephrolithiasis of the right collecting system measuring 15 mm, 10 mm, 15 mm. No perinephric stranding on the right. Suprapubic catheter within the urinary bladder with bladder decompressed. Surgical changes of left nephrectomy. Fluid and gas collection in the left retroperitoneal space, continuous with the psoas muscle medially and the lateral abdominal wall laterally measuring 111 mm by 75 mm on the axial images. Flocculent material at the superior aspect of the fluid collection. The left colon is displaced anteriorly. Uncertain if there is a connection between left colon and the abscess cavity on this CT image. Flocculent material within the left-sided flank measures 138 mm with no definite connection to the retroperitoneal space. Stomach/Bowel: Hiatal hernia. Unremarkable stomach. Small bowel  partially distended with air-fluid levels. No transition point. Moderate stool burden. No significantly distended colon. Colonic diverticular change without associated inflammatory changes. Appendix is not visualized, however, no inflammatory changes are present adjacent to the cecum to indicate an appendicitis. Vascular/Lymphatic: Calcifications of the abdominal aorta. No aneurysm. Calcifications of the iliac arteries. Reproductive: Unremarkable prostate. Other: None Musculoskeletal: Multilevel degenerative changes of the visualized thoracolumbar spine. No acute displaced fracture. Degenerative changes of bilateral hips. Surgical changes of thoracic laminectomy IMPRESSION: Fluid and gas collection of the left retroperitoneal space, potentially abscess, hematoma, or seroma/lymphocele. Status post left nephrectomy. Flocculent material in the left flank, concerning for abscess. Right-sided ureteral stent with the proximal loop formed in the collecting system and the distal loop formed in the distal ureter. Referral for follow-up urologic evaluation is recommended. Multiple nonobstructing stones of the right collecting system. Cholelithiasis without evidence of acute inflammatory changes. Diverticular disease without evidence of acute diverticulitis. Moderate stool burden with no evidence of obstruction. Aortic Atherosclerosis (ICD10-I70.0). Electronically Signed   By: Corrie Mckusick D.O.   On: 08/04/2018 08:37   Dg Abd 1 View  Result Date: 08/03/2018 CLINICAL DATA:  Chronic generalized abdominal pain. Ureteral stent displacement. EXAM: ABDOMEN - 1 VIEW COMPARISON:  02/26/2018 FINDINGS: Nonobstructive/nonspecific bowel gas pattern. Paucity of gas in the left abdomen with gas collection overlying the left flank. Right-sided ureteral stent with proximal tip in the expected location of the right renal pelvis and distal tip in the right pelvis. IMPRESSION: Unchanged positioning of the right ureteral stent. Nonspecific  bowel gas pattern. Gas collection overlies the left flank. Electronically Signed   By: Fidela Salisbury M.D.   On: 08/03/2018 17:34   Dg Chest Port 1 View  Result Date: 08/02/2018 CLINICAL DATA:  Abscess EXAM: PORTABLE CHEST 1 VIEW COMPARISON:  09/21/2017, 09/17/2017 FINDINGS: Low lung volumes. Elevation of the right diaphragm. No focal opacity or pleural effusion. Rounded enlargement of the right hilus. IMPRESSION: 1. Rounded enlargement of right hilus, could be secondary to elevated diaphragm and crowding of the central pulmonary vessels, however hilar nodes or mass are not excluded. Correlation with contrast-enhanced CT is suggested. 2. No focal pulmonary opacity. Electronically Signed   By: Donavan Foil M.D.   On: 08/02/2018 20:34   Discharge Instructions: Discharge Instructions    Diet - low sodium heart healthy   Complete by:  As directed    Discharge instructions   Complete by:  As directed    Jerry [name],  Please take IV Zoysn every 8 hours until December 1st. Then switch to Augmentin, which is an oral medication, twice a day for 2 months.   [facility]iology will be in touch to schedule a follow up appointment to  assess your drains and repeat a CT scan. We will want you to continue to flush drains with 5-10 cc sterile saline daily and record drain output daily.   Increase activity slowly   Complete by:  As directed       Signed: Frimet Durfee N, DO 08/20/2018, 11:45 AM   Pager: [phone]

## 2018-08-05 NOTE — Care Management Important Message (Signed)
Important Message  Patient Details  Name: Jerry [name] MRN: [id number] Date of Birth: 04-28-1946   Medicare Important Message Given:  Yes    Orbie Pyo 08/05/2018, 3:42 PM

## 2018-08-05 NOTE — Evaluation (Signed)
Physical Therapy Evaluation Patient Details Name: Jerry [name] MRN: [id number] DOB: January 12, 1946 Today's Date: 08/05/2018   History of Present Illness  Jerry [name] is a 72yo male who comes to [facility] on 10/18 p 1W decreased appetite, lethargy, found to be septic from a psoas abscess. PTA pt has been at [facility] then SNF, reports unable to AMB past 9-22months, but was able to self propel in WC. Pt reports needing hoyer lift transfers from bed to [place].   Clinical Impression  Pt admitted with above diagnosis. Pt currently with functional limitations due to the deficits listed below (see "PT Problem List"). Upon entry, pt in bed, no family/caregiver present. Noted untouched breaksfast tray, as well as lunch tray, pt reports needing help with set up for self feeding, c/o not being able to get to his meals. The pt is awake and agreeable to participate. The pt is alert and oriented x3, pleasant, conversational, and following simple commands consistently. Intermittent lack of clarity of details why patient has had such a dramatic decrease in function in last year, but cites chronic persistent infection. RN assists with repositioning in bed to allow better tolerance of transition from supine to recumbency, which allowed for better tolerated position for self feeding, swallowing, and pulmonary hygiene. RN made visual confirmation of safe positioning of drain sites prior to pulling up toward [place]. Functional mobility assessment demonstrates need for +2 total assist for physical assistance, whereas the patient performed these at a higher level of independence PTA, reporting better ability to assist with rolling. WC mobility not able to be tested at this time d/t acute on chronic exacerbation of low back pain secondary to drainage procedure positioning. Pt will benefit from skilled PT intervention to increase independence and safety with basic mobility in preparation for discharge to the venue listed below.       Follow Up  Recommendations SNF;Supervision/Assistance - 24 hour;Home health PT    Equipment Recommendations  None recommended by PT    Recommendations for Other Services       Precautions / Restrictions Precautions Precaution Comments: Left flank drains, supraoubic catheter.  Restrictions Weight Bearing Restrictions: No      Mobility  Bed Mobility Overal bed mobility: Needs Assistance Bed Mobility: Rolling(scooting) Rolling: +2 for physical assistance;Total assist         General bed mobility comments: pain limitations, also very weak  Transfers Overall transfer level: (hoyer lift transfer PTA x several months)                  Ambulation/Gait                Stairs            Wheelchair Mobility    Modified Rankin (Stroke Patients Only)       Balance                                             Pertinent Vitals/Pain Pain Assessment: 0-10 Pain Score: 5  Pain Location: Left flank, post procedure pain, worse with rolling in bed. Also has acute on chronic exacerbation of postoperative orthopedica spine pain sittred up from procedure yesterday.   Pain Intervention(s): Limited activity within patient's tolerance;Monitored during session;Repositioned    Home Living Family/patient expects to be discharged to:: Skilled nursing facility  Additional Comments: Reports very limited BLE A/ROM past 9 months, unable to stand for transfers or pivot, typically needs max-totalA physical assist for bed mobility, hoyer lift for transfers.     Prior Function Level of Independence: Needs assistance      ADL's / Homemaking Assistance Needed: Could self propel in WC PTA, but reports feeling incredibly weak and feels unlikely he would be able to do so.         Hand Dominance   Dominant Hand: Left    Extremity/Trunk Assessment   Upper Extremity Assessment Upper Extremity Assessment: Generalized weakness(limited capacity for  self feeding, but is able. )    Lower Extremity Assessment Lower Extremity Assessment: Generalized weakness       Communication   Communication: No difficulties  Cognition Arousal/Alertness: Awake/alert Behavior During Therapy: WFL for tasks assessed/performed Overall Cognitive Status: Within Functional Limits for tasks assessed                                        General Comments      Exercises     Assessment/Plan    PT Assessment Patient needs continued PT services  PT Problem List Decreased strength;Decreased activity tolerance;Decreased mobility       PT Treatment Interventions Therapeutic exercise;Stair training;Functional mobility training;Therapeutic activities;Patient/family education    PT Goals (Current goals can be found in the Care Plan section)  Acute Rehab PT Goals Patient Stated Goal: return to [facility] self propulsion PT Goal Formulation: With patient Time For Goal Achievement: 08/12/18 Potential to Achieve Goals: Fair    Frequency Min 2X/week   Barriers to discharge        Co-evaluation               AM-PAC PT "6 Clicks" Daily Activity  Outcome Measure Difficulty turning over in bed (including adjusting bedclothes, sheets and blankets)?: Unable Difficulty moving from lying on back to sitting on the side of the bed? : Unable Difficulty sitting down on and standing up from a chair with arms (e.g., wheelchair, bedside commode, etc,.)?: Unable Help needed moving to and from a bed to chair (including a wheelchair)?: Total Help needed walking in hospital room?: Total Help needed climbing 3-5 steps with a railing? : Total 6 Click Score: 6    End of Session   Activity Tolerance: Patient tolerated treatment well;Patient limited by pain;Patient limited by fatigue Patient left: in bed;with call bell/phone within reach;Other (comment)(meal tray presented, salad dressed, drink opened, pudding spooned. ) Nurse Communication: Mobility  status;Other (comment)(needs assist with position for meals. ) PT Visit Diagnosis: Muscle weakness (generalized) (M62.81);Difficulty in walking, not elsewhere classified (R26.2)    Time: 4742-5956 PT Time Calculation (min) (ACUTE ONLY): 20 min   Charges:   PT Evaluation $PT Eval High Complexity: 1 High          2:52 PM, 08/05/18 Etta Grandchild, PT, DPT Physical Therapist - [place] [phone] (Pager)  [phone] (Office)     Amra Shukla C 08/05/2018, 2:46 PM

## 2018-08-06 ENCOUNTER — Inpatient Hospital Stay: Payer: Self-pay

## 2018-08-06 DIAGNOSIS — Z87442 Personal history of urinary calculi: Secondary | ICD-10-CM

## 2018-08-06 DIAGNOSIS — K6812 Psoas muscle abscess: Secondary | ICD-10-CM

## 2018-08-06 DIAGNOSIS — K6819 Other retroperitoneal abscess: Secondary | ICD-10-CM

## 2018-08-06 DIAGNOSIS — Z960 Presence of urogenital implants: Secondary | ICD-10-CM

## 2018-08-06 DIAGNOSIS — N319 Neuromuscular dysfunction of bladder, unspecified: Secondary | ICD-10-CM

## 2018-08-06 DIAGNOSIS — Z905 Acquired absence of kidney: Secondary | ICD-10-CM

## 2018-08-06 DIAGNOSIS — N179 Acute kidney failure, unspecified: Secondary | ICD-10-CM

## 2018-08-06 DIAGNOSIS — Z978 Presence of other specified devices: Secondary | ICD-10-CM

## 2018-08-06 DIAGNOSIS — N390 Urinary tract infection, site not specified: Secondary | ICD-10-CM

## 2018-08-06 DIAGNOSIS — G959 Disease of spinal cord, unspecified: Secondary | ICD-10-CM

## 2018-08-06 DIAGNOSIS — B9561 Methicillin susceptible Staphylococcus aureus infection as the cause of diseases classified elsewhere: Secondary | ICD-10-CM

## 2018-08-06 DIAGNOSIS — A419 Sepsis, unspecified organism: Principal | ICD-10-CM

## 2018-08-06 DIAGNOSIS — N183 Chronic kidney disease, stage 3 (moderate): Secondary | ICD-10-CM

## 2018-08-06 DIAGNOSIS — Z87440 Personal history of urinary (tract) infections: Secondary | ICD-10-CM

## 2018-08-06 LAB — BASIC METABOLIC PANEL
ANION GAP: 7 (ref 5–15)
BUN: 22 mg/dL (ref 8–23)
CHLORIDE: 110 mmol/L (ref 98–111)
CO2: 19 mmol/L — ABNORMAL LOW (ref 22–32)
Calcium: 9.4 mg/dL (ref 8.9–10.3)
Creatinine, Ser: 1.51 mg/dL — ABNORMAL HIGH (ref 0.61–1.24)
GFR calc Af Amer: 51 mL/min — ABNORMAL LOW (ref >60)
GFR, EST NON AFRICAN AMERICAN: 44 mL/min — AB (ref >60)
Glucose, Bld: 96 mg/dL (ref 70–99)
POTASSIUM: 4 mmol/L (ref 3.5–5.1)
SODIUM: 136 mmol/L (ref 135–145)

## 2018-08-06 LAB — GLUCOSE, CAPILLARY: GLUCOSE-CAPILLARY: 88 mg/dL (ref 70–99)

## 2018-08-06 LAB — CBC
HCT: 25.1 % — ABNORMAL LOW (ref 39.0–52.0)
HEMOGLOBIN: 7.4 g/dL — AB (ref 13.0–17.0)
MCH: 26 pg (ref 26.0–34.0)
MCHC: 29.5 g/dL — AB (ref 30.0–36.0)
MCV: 88.1 fL (ref 80.0–100.0)
NRBC: 0 % (ref 0.0–0.2)
Platelets: 317 10*3/uL (ref 150–400)
RBC: 2.85 MIL/uL — AB (ref 4.22–5.81)
RDW: 15.4 % (ref 11.5–15.5)
WBC: 15.3 10*3/uL — AB (ref 4.0–10.5)

## 2018-08-06 LAB — PATHOLOGIST SMEAR REVIEW

## 2018-08-06 MED ORDER — ACETAMINOPHEN 500 MG PO TABS
500.0000 mg | ORAL_TABLET | ORAL | Status: DC
  Administered 2018-08-06 – 2018-08-21 (×79): 500 mg via ORAL
  Filled 2018-08-06 (×80): qty 1

## 2018-08-06 MED ORDER — ACETAMINOPHEN 650 MG RE SUPP
650.0000 mg | RECTAL | Status: DC
  Filled 2018-08-06 (×7): qty 1

## 2018-08-06 NOTE — Progress Notes (Signed)
Internal Medicine Attending:   I saw and examined the patient. I reviewed the resident's note and I agree with the resident's findings and plan as documented in the resident's note.  Patient states that he did not sleep well secondary to being constantly interrupted overnight.  He also complains of some persistent pain over his back where the drains are.  No fevers or chills.  He does complain of some mild nausea as well.  Patient was initially admitted to the hospital with sepsis secondary to a large retroperitoneal and psoas abscess.  IR follow-up and recommendations appreciated.  Patient is status post drainage of 800 cc of purulent fluid from his retroperitoneal abscess.  Patient has 2 drains in place currently with 40 cc drainage each overnight.  We will continue with vancomycin, cefepime and Flagyl for now.  We will follow-up cultures from the abscess.  They appear to be growing gram-negative rods but speciation is not back yet.  Gram stain also showed gram-positive cocci so we will continue with vancomycin for now but consider stopping this once culture results.  Will discuss case with ID regarding possible source of abscess as well as duration of antibiotics and whether the patient will need a PICC line or not.  No further work-up at this time

## 2018-08-06 NOTE — Progress Notes (Signed)
   Subjective: Jerry [name] reported feeling about the same today. He was feeling tired because he did not sleep well. He reported his back was sore from the drain placements. Denied any fevers abdominal pain or vomiting. Said he felt nauseated.   Objective:  Vital signs in last 24 hours: Vitals:   08/05/18 1300 08/05/18 2048 08/06/18 0532 08/06/18 0645  BP: (!) 145/85 (!) 102/54 (!) 96/54 108/75  Pulse: 78 73 75 77  Resp: 18 18 18 18   Temp: 97.6 F (36.4 C) 98.1 F (36.7 C) 97.8 F (36.6 C)   TempSrc: Oral Oral Oral   SpO2: 98% 98% 98% 96%  Weight:      Height:       Physical Exam  Constitutional: He is oriented to person, place, and time and well-developed, well-nourished, and in no distress.  Cardiovascular: Normal rate, regular rhythm and normal heart sounds.  No murmur heard. Pulmonary/Chest: Breath sounds normal. No respiratory distress. He has no wheezes.  Abdominal: Soft. Bowel sounds are normal. He exhibits no distension. There is tenderness.  Musculoskeletal: He exhibits no edema.  Able to move around his LE a little more today  Neurological: He is alert and oriented to person, place, and time.  Skin: Skin is warm and dry.    Assessment/Plan:  Active Problems:   Psoas abscess, left (HCC)  Sepsis secondary to psoas abscess  Leukocytosis continues to improve, 15. He continues to be afebrile. IR drains still in place.Blood cultures with no growth to date.  - Continue with current antibiotic regimen including vancomycin, cefepime and Flagyl - will consider PICC line placement depending on course of abx treatment - IR managing drain care  - f/u abscess cultures   Acute on chronic CKD UTI Improving, creatinine 1.5 baseline appears to be 1.3. Suprapubic catheter in place without signs of infection. UTI grew Pseudomonas. Will continue with cefepime as above. - No acute urological intervention required at this time  Normocytic anemia Hgb 7.4, stable; will  monitor -cbc in am -repeat stat H&H if changes in vitals  Hypokalemia - K 4.0, resolved - f/u am bmp  Hx of HFpEF Used to be on Lasix PO 20mg  daily at home - Monitor for signs of hypervolemia  Dispo: Anticipated discharge in approximately 2-3 days  Mike Craze, DO 08/06/2018, 6:51 AM Pager: [phone]

## 2018-08-06 NOTE — Progress Notes (Signed)
Can Place PICC line today per Dr Dareen Piano, [place].

## 2018-08-06 NOTE — Progress Notes (Signed)
Referring Physician(s): Dr Dareen Piano  Supervising Physician: Arne Cleveland  Patient Status:  [facility] - In-pt  Chief Complaint:  Retroperitoneal abscess  Subjective:  2 drains placed in IR 10/20 Pt does feel better as far as pain 40 cc ea yesterday   Allergies: Patient has no known allergies.  Medications: Prior to Admission medications   Medication Sig Start Date End Date Taking? Authorizing Provider  acetaminophen (TYLENOL) 325 MG tablet Take 650 mg by mouth 3 (three) times daily.   Yes [provider]  polyethylene glycol (MIRALAX) packet Take 17 g by mouth 2 (two) times daily. Patient not taking: Reported on 08/02/2018 09/26/17   Modena Jansky, MD  senna (SENOKOT) 8.6 MG TABS tablet Take 2 tablets (17.2 mg total) by mouth at bedtime as needed for mild constipation or moderate constipation. Patient not taking: Reported on 08/02/2018 09/26/17   Modena Jansky, MD  vitamin B-12 (CYANOCOBALAMIN) 1000 MCG tablet Take 2 tablets (2,000 mcg total) by mouth daily. Patient not taking: Reported on 08/02/2018 09/26/17   Modena Jansky, MD     Vital Signs: BP 108/75   Pulse 77   Temp 97.8 F (36.6 C) (Oral)   Resp 18   Ht 5\' 11"  (1.803 m)   Wt 228 lb 13.4 oz (103.8 kg)   SpO2 96%   BMI 31.92 kg/m   Physical Exam  Constitutional: He is oriented to person, place, and time.  Neurological: He is alert and oriented to person, place, and time.  Skin: Skin is warm and dry.  Sites are clean and dry NT no bleeding OP yellow/brown JP intact: 10 cc in bulb Bag drain intact: air and yellow brown fluid--- 10-20 cc Cx pending Abundant wbcs  Psychiatric: He has a normal mood and affect. Thought content normal.  Vitals reviewed.   Imaging: Ct Abdomen Pelvis Wo Contrast  Result Date: 08/04/2018 CLINICAL DATA:  72 year old male with a history of retroperitoneal abscess. EXAM: CT ABDOMEN AND PELVIS WITHOUT CONTRAST TECHNIQUE: Multidetector CT imaging of the  abdomen and pelvis was performed following the standard protocol without IV contrast. COMPARISON:  No prior CT. Plain film 08/03/2018, CT 02/23/2018, 02/23/2018, 01/01/2018 FINDINGS: Lower chest: Atelectasis of the inferior lungs. Coronary artery disease and valve calcifications. Hepatobiliary: Unremarkable appearance of the liver. Cholelithiasis. No pericholecystic fluid or inflammatory changes. Pancreas: Unremarkable Spleen: Unremarkable Adrenals/Urinary Tract: Unremarkable adrenal glands. Double-J right ureteral stent with the proximal loop formed in the collecting system and the distal loop formed in the distal ureter. Nephrolithiasis of the right collecting system measuring 15 mm, 10 mm, 15 mm. No perinephric stranding on the right. Suprapubic catheter within the urinary bladder with bladder decompressed. Surgical changes of left nephrectomy. Fluid and gas collection in the left retroperitoneal space, continuous with the psoas muscle medially and the lateral abdominal wall laterally measuring 111 mm by 75 mm on the axial images. Flocculent material at the superior aspect of the fluid collection. The left colon is displaced anteriorly. Uncertain if there is a connection between left colon and the abscess cavity on this CT image. Flocculent material within the left-sided flank measures 138 mm with no definite connection to the retroperitoneal space. Stomach/Bowel: Hiatal hernia. Unremarkable stomach. Small bowel partially distended with air-fluid levels. No transition point. Moderate stool burden. No significantly distended colon. Colonic diverticular change without associated inflammatory changes. Appendix is not visualized, however, no inflammatory changes are present adjacent to the cecum to indicate an appendicitis. Vascular/Lymphatic: Calcifications of the abdominal aorta. No  aneurysm. Calcifications of the iliac arteries. Reproductive: Unremarkable prostate. Other: None Musculoskeletal: Multilevel  degenerative changes of the visualized thoracolumbar spine. No acute displaced fracture. Degenerative changes of bilateral hips. Surgical changes of thoracic laminectomy IMPRESSION: Fluid and gas collection of the left retroperitoneal space, potentially abscess, hematoma, or seroma/lymphocele. Status post left nephrectomy. Flocculent material in the left flank, concerning for abscess. Right-sided ureteral stent with the proximal loop formed in the collecting system and the distal loop formed in the distal ureter. Referral for follow-up urologic evaluation is recommended. Multiple nonobstructing stones of the right collecting system. Cholelithiasis without evidence of acute inflammatory changes. Diverticular disease without evidence of acute diverticulitis. Moderate stool burden with no evidence of obstruction. Aortic Atherosclerosis (ICD10-I70.0). Electronically Signed   By: Corrie Mckusick D.O.   On: 08/04/2018 08:37   Dg Abd 1 View  Result Date: 08/03/2018 CLINICAL DATA:  Chronic generalized abdominal pain. Ureteral stent displacement. EXAM: ABDOMEN - 1 VIEW COMPARISON:  02/26/2018 FINDINGS: Nonobstructive/nonspecific bowel gas pattern. Paucity of gas in the left abdomen with gas collection overlying the left flank. Right-sided ureteral stent with proximal tip in the expected location of the right renal pelvis and distal tip in the right pelvis. IMPRESSION: Unchanged positioning of the right ureteral stent. Nonspecific bowel gas pattern. Gas collection overlies the left flank. Electronically Signed   By: Fidela Salisbury M.D.   On: 08/03/2018 17:34   Dg Chest Port 1 View  Result Date: 08/02/2018 CLINICAL DATA:  Abscess EXAM: PORTABLE CHEST 1 VIEW COMPARISON:  09/21/2017, 09/17/2017 FINDINGS: Low lung volumes. Elevation of the right diaphragm. No focal opacity or pleural effusion. Rounded enlargement of the right hilus. IMPRESSION: 1. Rounded enlargement of right hilus, could be secondary to elevated  diaphragm and crowding of the central pulmonary vessels, however hilar nodes or mass are not excluded. Correlation with contrast-enhanced CT is suggested. 2. No focal pulmonary opacity. Electronically Signed   By: Donavan Foil M.D.   On: 08/02/2018 20:34   Ct Image Guided Drainage By Percutaneous Catheter  Result Date: 08/04/2018 INDICATION: 72 year old male with retroperitoneal abscess EXAM: CT-GUIDED FLANK ABSCESS DRAINAGE CT-GUIDED LEFT RETROPERITONEAL ABSCESS DRAINAGE MEDICATIONS: The patient is currently admitted to the hospital and receiving intravenous antibiotics. The antibiotics were administered within an appropriate time frame prior to the initiation of the procedure. ANESTHESIA/SEDATION: Fentanyl 2.0 mcg IV; Versed 100 mg IV Moderate Sedation Time:  22 minutes The patient was continuously monitored during the procedure by the interventional radiology nurse under my direct supervision. COMPLICATIONS: None PROCEDURE: Informed written consent was obtained from the patient after a thorough discussion of the procedural risks, benefits and alternatives. All questions were addressed. Maximal Sterile Barrier Technique was utilized including caps, mask, sterile gowns, sterile gloves, sterile drape, hand hygiene and skin antiseptic. A timeout was performed prior to the initiation of the procedure. Patient position prone position on CT gantry table. Scout CT of the abdomen was performed for planning purposes. The patient was then prepped and draped in the usual sterile fashion. 1% lidocaine was used for local anesthesia. Using modified Seldinger technique, coaxial trocar needle placed into the flank abscess in the retroperitoneal abscess. Twelve French drain was placed within the retroperitoneal abscess in the flank abscess. Approximately 30 cc of purulent material aspirated from the flank abscess with a sample sent for culture. Approximately 800 cc of purulent material aspirated from the retroperitoneal  abscess. Sample sent to the lab for analysis. Both Reamer attached to bulb suction. Sterile dressings were placed. Patient tolerated the  procedure well and remained hemodynamically stable throughout. No complications were encountered and no significant blood loss. FINDINGS: After drainage, there is flocculent material persisting within the flank. After drainage, there is significant reduction in the fluid abscess of the retroperitoneum with small gas persisting. IMPRESSION: Status post CT-guided drainage of left retroperitoneal abscess with a 12 French drain as well as a left-sided flank abscess with a 12 [place] drain. Signed, Dulcy Fanny. Dellia Nims, RPVI [facility] Radiology Electronically Signed   By: Corrie Mckusick D.O.   On: 08/04/2018 13:53    Labs:  CBC: Recent Labs    08/03/18 1637 08/04/18 0353 08/05/18 0525 08/06/18 0341  WBC 20.2* 17.4* 16.0* 15.3*  HGB 7.3* 7.1* 7.9* 7.4*  HCT 24.1* 24.3* 26.7* 25.1*  PLT 274 301 295 317    COAGS: Recent Labs    08/03/18 0603  INR 1.36  APTT 43*    BMP: Recent Labs    08/03/18 0603 08/04/18 0353 08/05/18 0525 08/06/18 0341  NA 138 138 136 136  K 3.5 2.8* 3.2* 4.0  CL 108 111 112* 110  CO2 20* 20* 19* 19*  GLUCOSE 94 90 91 96  BUN 33* 28* 25* 22  CALCIUM 9.8 9.6 9.4 9.4  CREATININE 2.28* 1.88* 1.60* 1.51*  GFRNONAA 27* 34* 41* 44*  GFRAA 31* 39* 47* 51*    LIVER FUNCTION TESTS: Recent Labs    09/21/17 0743 08/02/18 1921  BILITOT 1.1 0.5  AST 39 20  ALT 19 15  ALKPHOS 70 71  PROT 7.4 7.1  ALBUMIN 2.7* 1.9*    Assessment and Plan:  RP abscess drains x 2 1 JP and 1 bag drain 40 cc ea yesterday cx pending Will follow  Electronically Signed: Lugene Beougher A, PA-C 08/06/2018, 7:22 AM   I spent a total of 15 Minutes at the the patient's bedside AND on the patient's hospital floor or unit, greater than 50% of which was counseling/coordinating care for RP abscess  drains

## 2018-08-06 NOTE — Consult Note (Signed)
[place] for Infectious Disease  Total days of antibiotics 5        Day 5 vanco/cefepime/metro               Reason for Consult: abdominal wall/ intra-abdominal infection    Referring Physician: narenda  Active Problems:   Psoas abscess, left (HCC)    HPI: Jerry [name] is a 72 y.o. male with history of cervical myelopathy, and neurogenic bladder, s/p suprapubic catheter. He also has recent hx of left nephrectomy spring 2019 related to recurrent uti/staghorn calculi. Has had required repeat lithotripsy plus ureteral stent for mgmt of nephrolithiasis. He was admitted roughly 5 days ago after his PCP was trying to evaluate for recent fatigue, decrease appetite, weakness. He denied dysuria or cloudy urine in his foley. a  recent CT abdomen pelvis showing left retroperitoneal/psoas abscess measuring 6.9 x 8.8 x 17 cm.  Patient also complains of some subjective chills at the nursing facility as well.  No chest pain, shortness of breath, no palpitations, no lightheadedness, syncope, no headache, no nausea or vomiting, no abdominal pain, no diarrhea.  In the ED patient was noted to have leukocytosis of 33 and an elevated lactic acid of 2.14 and was admitted for sepsis likely secondary to his underlying abscess. Ir placed drains in the affected area where initially had 830m of purulent fluid aspirated. 10/18 urine cx grew staph aureus and PsA (unclear how that was collected since patient has same suprapubic catheter apparatus in place) 10/20 abscess fluid is showing kleb pneumo and proteus. He has prior proteus utis in the past (but also other GNR including PsA)  Past Medical History:  Diagnosis Date  . Acute kidney injury ([place] 09/21/2017  . Arthritis    "hands, knees, back, legs" (06/10/2014)  . Atonic neurogenic bladder 06/30/2016  . Bilateral leg weakness 10/25/2012  . Cervical myelopathy ([facility]   . Chronic diastolic congestive heart failure ([facility] 06/23/2014  . Coma ([facility] 1968   "for 2 wks"    . Fall at home 10/25/2012  . Generalized weakness 06/23/2014  . HWIOMBTDH(741.6    "monthly" (06/10/2014)  . Lower urinary tract infectious disease 10/25/2012  . Malaria   . Obesity 10/25/2012  . Presence of suprapubic catheter ([facility] 06/17/2014   Due to prostate cancer   . Prostate cancer ([facility]   . PTSD (post-traumatic stress disorder)    "I'm all over that now"  . Rhabdomyolysis 09/21/2017  . Sleep apnea 10/25/2012  . Staghorn calculus 06/30/2016  . Staphylococcus aureus bacteremia 06/30/2014  . Urinary incontinence, functional 10/25/2012  . UTI (urinary tract infection) 09/2017    Allergies: No Known Allergies  Current antibiotics:   MEDICATIONS: . acetaminophen  500 mg Oral Q4H   Or  . acetaminophen  650 mg Rectal Q4H  . enoxaparin (LOVENOX) injection  40 mg Subcutaneous Q24H  . feeding supplement  1 Container Oral TID BM  . sodium chloride flush  5 mL Intracatheter Q8H    Social History   Tobacco Use  . Smoking status: Never Smoker  . Smokeless tobacco: Never Used  . Tobacco comment: "[place] cigarettes  [place]  Substance Use Topics  . Alcohol use: No  . Drug use: No    Family History  Problem Relation Age of Onset  . Heart disease Mother   . Dementia Mother   . Heart disease Father   . Dementia Father     Review of Systems -  Constitutional:positive  for fever, chills, diaphoresis, activity change, appetite change, fatigue and unexpected weight change.  HENT: Negative for congestion, sore throat, rhinorrhea, sneezing, trouble swallowing and sinus pressure.  Eyes: Negative for photophobia and visual disturbance.  Respiratory: Negative for cough, chest tightness, shortness of breath, wheezing and stridor.  Cardiovascular: Negative for chest pain, palpitations and leg swelling.  Gastrointestinal: Negative for nausea, vomiting, abdominal pain, diarrhea, constipation, blood in stool, abdominal distention and anal bleeding.  Genitourinary: Negative  for dysuria, hematuria, flank pain and difficulty urinating.  Musculoskeletal: Negative for myalgias, back pain, joint swelling, arthralgias and gait problem.  Skin: Negative for color change, pallor, rash and wound.  Neurological: Negative for dizziness, tremors, weakness and light-headedness.  Hematological: Negative for adenopathy. Does not bruise/bleed easily.  Psychiatric/Behavioral: Negative for behavioral problems, confusion, sleep disturbance, dysphoric mood, decreased concentration and agitation.      OBJECTIVE: Temp:  [97.8 F (36.6 C)-98.9 F (37.2 C)] 98.9 F (37.2 C) (10/22 1434) Pulse Rate:  [70-77] 70 (10/22 1434) Resp:  [18] 18 (10/22 0645) BP: (87-108)/(54-75) 87/63 (10/22 1434) SpO2:  [96 %-99 %] 99 % (10/22 1434) Physical Exam  Constitutional: He is oriented to person, place, and time. He appears well-developed and chronically ill. No distress.  HENT:  Mouth/Throat: Oropharynx is clear and moist. No oropharyngeal exudate.  Cardiovascular: Normal rate, regular rhythm and normal heart sounds. Exam reveals no gallop and no friction rub.  No murmur heard.  Pulmonary/Chest: Effort normal and breath sounds normal. No respiratory distress. He has no wheezes.  Abdominal: Soft. Bowel sounds are normal. He exhibits no distension. There is no tenderness. Suprapubic catheter with dark brown urine Back = left sided 2 drains in place with brown opague fluid in bulb Neurological: He is alert and oriented to person, place, and time.  Skin: Skin is warm and dry. No rash noted. No erythema.  Psychiatric: He has a normal mood and affect. His behavior is normal.    LABS: Results for orders placed or performed during the hospital encounter of 08/02/18 (from the past 48 hour(s))  CBC     Status: Abnormal   Collection Time: 08/05/18  5:25 AM  Result Value Ref Range   WBC 16.0 (H) 4.0 - 10.5 K/uL   RBC 3.02 (L) 4.22 - 5.81 MIL/uL   Hemoglobin 7.9 (L) 13.0 - 17.0 g/dL   HCT 26.7  (L) 39.0 - 52.0 %   MCV 88.4 80.0 - 100.0 fL   MCH 26.2 26.0 - 34.0 pg   MCHC 29.6 (L) 30.0 - 36.0 g/dL   RDW 15.6 (H) 11.5 - 15.5 %   Platelets 295 150 - 400 K/uL   nRBC 0.0 0.0 - 0.2 %    Comment: Performed at [facility], [address], [place], [place] [address]  Basic metabolic panel     Status: Abnormal   Collection Time: 08/05/18  5:25 AM  Result Value Ref Range   Sodium 136 135 - 145 mmol/L   Potassium 3.2 (L) 3.5 - 5.1 mmol/L   Chloride 112 (H) 98 - 111 mmol/L   CO2 19 (L) 22 - 32 mmol/L   Glucose, Bld 91 70 - 99 mg/dL   BUN 25 (H) 8 - 23 mg/dL   Creatinine, Ser 1.60 (H) 0.61 - 1.24 mg/dL   Calcium 9.4 8.9 - 10.3 mg/dL   GFR calc non Af Amer 41 (L) >60 mL/min   GFR calc Af Amer 47 (L) >60 mL/min    Comment: (NOTE)  The eGFR has been calculated using the CKD EPI equation. This calculation has not been validated in all clinical situations. eGFR's persistently <60 mL/min signify possible Chronic Kidney Disease.    Anion gap 5 5 - 15    Comment: Performed at [facility] [address], [place], [place] [address]  Glucose, capillary     Status: None   Collection Time: 08/05/18  8:18 AM  Result Value Ref Range   Glucose-Capillary 85 70 - 99 mg/dL  Basic metabolic panel     Status: Abnormal   Collection Time: 08/06/18  3:41 AM  Result Value Ref Range   Sodium 136 135 - 145 mmol/L   Potassium 4.0 3.5 - 5.1 mmol/L    Comment: NO VISIBLE HEMOLYSIS   Chloride 110 98 - 111 mmol/L   CO2 19 (L) 22 - 32 mmol/L   Glucose, Bld 96 70 - 99 mg/dL   BUN 22 8 - 23 mg/dL   Creatinine, Ser 1.51 (H) 0.61 - 1.24 mg/dL   Calcium 9.4 8.9 - 10.3 mg/dL   GFR calc non Af Amer 44 (L) >60 mL/min   GFR calc Af Amer 51 (L) >60 mL/min    Comment: (NOTE) The eGFR has been calculated using the CKD EPI equation. This calculation has not been validated in all clinical situations. eGFR's persistently <60 mL/min signify possible Chronic Kidney Disease.    Anion gap 7 5 - 15     Comment: Performed at [facility] [address] [place], [place] [address]  CBC     Status: Abnormal   Collection Time: 08/06/18  3:41 AM  Result Value Ref Range   WBC 15.3 (H) 4.0 - 10.5 K/uL   RBC 2.85 (L) 4.22 - 5.81 MIL/uL   Hemoglobin 7.4 (L) 13.0 - 17.0 g/dL   HCT 25.1 (L) 39.0 - 52.0 %   MCV 88.1 80.0 - 100.0 fL   MCH 26.0 26.0 - 34.0 pg   MCHC 29.5 (L) 30.0 - 36.0 g/dL   RDW 15.4 11.5 - 15.5 %   Platelets 317 150 - 400 K/uL   nRBC 0.0 0.0 - 0.2 %    Comment: Performed at [facility], [place] [address], [place],  [address]  Glucose, capillary     Status: None   Collection Time: 08/06/18  8:13 AM  Result Value Ref Range   Glucose-Capillary 88 70 - 99 mg/dL    MICRO: reviewed IMAGING: [place] Ekg Site Rite  Result Date: 08/06/2018 If Site Rite image not attached, placement could not be confirmed due to current cardiac rhythm.   Assessment/Plan:  Sepsis due to retroperitoneal abscess +/- urinary vs colonic source. Currently on broad spectrum abtx with vanco, cefepime plus metronidazole  - continue on broad spectrum abtx until sensitivities released on staph aureus tomorrow - recommend to consider repeat abd CT with contrast to see if there is an fistula from colon to retroperitoneal collection  ckd3 = aki is improving with IVF  Complicated uti = unusual to find staph aureus in urine. Blood cx on admit were NGTD. Will check on sensitivities.

## 2018-08-06 NOTE — Evaluation (Signed)
Occupational Therapy Evaluation Patient Details Name: Jerry [name] MRN: [id number] DOB: Jan 17, 1946 Today's Date: 08/06/2018    History of Present Illness Jerry [name] is a 72yo male who comes to [facility] on 10/18 p 1W decreased appetite, lethargy, found to be septic from a psoas abscess. PTA pt has been at [facility] then SNF, reports unable to AMB past 9-10months, but was able to self propel in WC. Pt reports needing hoyer lift transfers from bed to [place].    Clinical Impression   This 72 y/o male presents with the above. Pt has most recently been in SNF and reports functional decline over the past 9-10 months. Pt reports he currently requires use of hoyer for transfers OOB to w/c, requires assist from staff at [facility] for ADL completion. Per chart review pt was previously able to self-propel w/c, though has recently been unable due to weakness. Pt presents this session with pain, significant weakness and fatigue with minimal activity. Pt currently requires min-modA for UB ADL, max-totalA for LB ADL at bed level. Pt will benefit from continued acute OT services and recommend continued therapy services in SNF setting after discharge to maximize his overall strength, safety and independence with ADLs and mobility.     Follow Up Recommendations  SNF;Supervision/Assistance - 24 hour    Equipment Recommendations  Other (comment)(defer to next venue)           Precautions / Restrictions Precautions Precautions: Fall Precaution Comments: Left flank drains, suprapubic catheter.  Restrictions Weight Bearing Restrictions: No      Mobility Bed Mobility Overal bed mobility: Needs Assistance Bed Mobility: Rolling Rolling: Max assist         General bed mobility comments: pt only able to partially roll with +1 maxA to readjust bed pads                                                                   ADL either performed or assessed with clinical judgement   ADL Overall  ADL's : Needs assistance/impaired Eating/Feeding: Set up;Bed level;Sitting   Grooming: Set up;Bed level   Upper Body Bathing: Bed level;Moderate assistance   Lower Body Bathing: Maximal assistance;+2 for physical assistance;Bed level   Upper Body Dressing : Bed level;Moderate assistance   Lower Body Dressing: Total assistance;+2 for physical assistance;Bed level       Toileting- Clothing Manipulation and Hygiene: Total assistance Toileting - Clothing Manipulation Details (indicate cue type and reason): suprapubic catheter       General ADL Comments: pt with significant weakness and pain with miminal bed mobility/repositioning                         Pertinent Vitals/Pain Pain Assessment: Faces Faces Pain Scale: Hurts even more Pain Location: L flank, low back with repositioning in bed  Pain Descriptors / Indicators: Grimacing;Discomfort Pain Intervention(s): Limited activity within patient's tolerance;Repositioned     Hand Dominance Left   Extremity/Trunk Assessment Upper Extremity Assessment Upper Extremity Assessment: Generalized weakness;RUE deficits/detail;LUE deficits/detail RUE Deficits / Details: decreased shoulder ROM due to weakness, grossly 3-/5 LUE Deficits / Details: decreased shoulder ROM due to weakness, grossly 3-/5   Lower Extremity Assessment Lower Extremity Assessment: Defer to PT evaluation  Communication Communication Communication: No difficulties   Cognition Arousal/Alertness: Awake/alert Behavior During Therapy: WFL for tasks assessed/performed Overall Cognitive Status: Within Functional Limits for tasks assessed                                                      Home Living Family/patient expects to be discharged to:: Skilled nursing facility                                 Additional Comments: Reports very limited BLE A/ROM past 9 months, unable to stand for transfers or pivot,  typically needs max-totalA physical assist for bed mobility, hoyer lift for transfers.       Prior Functioning/Environment Level of Independence: Needs assistance  Gait / Transfers Assistance Needed: reports use of hoyer for transfers OOB to w/c; during this session reports unable to self-propel w/c, though per chart review pt previously reporting previously able to do so; suspect increased difficulty at this time due to weakness ADL's / Homemaking Assistance Needed: pt reports staff assist with bathing/dressing ADLs            OT Problem List: Decreased strength;Decreased range of motion;Decreased activity tolerance;Impaired balance (sitting and/or standing);Pain      OT Treatment/Interventions: Self-care/ADL training;Therapeutic exercise;DME and/or AE instruction;Therapeutic activities;Balance training    OT Goals(Current goals can be found in the care plan section) Acute Rehab OT Goals Patient Stated Goal: return to [facility] self propulsion OT Goal Formulation: With patient Time For Goal Achievement: 08/20/18 Potential to Achieve Goals: Fair  OT Frequency: Min 2X/week   Barriers to D/C:                          AM-PAC PT "6 Clicks" Daily Activity     Outcome Measure Help from another person eating meals?: A Little Help from another person taking care of personal grooming?: A Little Help from another person toileting, which includes using toliet, bedpan, or urinal?: Total Help from another person bathing (including washing, rinsing, drying)?: A Lot Help from another person to put on and taking off regular upper body clothing?: A Lot Help from another person to put on and taking off regular lower body clothing?: Total 6 Click Score: 12   End of Session Nurse Communication: Mobility status  Activity Tolerance: Patient tolerated treatment well;Patient limited by fatigue Patient left: in bed;with call bell/phone within reach;with bed alarm set  OT Visit Diagnosis: Muscle  weakness (generalized) (M62.81)                Time: 8315-1761 OT Time Calculation (min): 18 min Charges:  OT General Charges $OT Visit: 1 Visit OT Evaluation $OT Eval Moderate Complexity: 1 Mod  Lou Cal, OT [employer] Pager [phone] Office [phone]  Raymondo Band 08/06/2018, 12:29 PM

## 2018-08-06 NOTE — Progress Notes (Signed)
[facility] Infusion Coordinator will follow pt with ID team to support Home infusion pharmacy services at [place] if needed/ordered.  If patient discharges after hours, please call [phone].   Larry Sierras 08/06/2018, 2:03 PM

## 2018-08-06 NOTE — Progress Notes (Signed)
Patient is currently denying PICC line placement. He stated he has had them in the past and did not find it necessary. The indications were explained to the patient and at this time he is still denying PICC line placement. Will discuss with patient again tomorrow during morning rounds.

## 2018-08-07 ENCOUNTER — Inpatient Hospital Stay (HOSPITAL_COMMUNITY): Payer: Medicare Other

## 2018-08-07 DIAGNOSIS — B961 Klebsiella pneumoniae [K. pneumoniae] as the cause of diseases classified elsewhere: Secondary | ICD-10-CM

## 2018-08-07 DIAGNOSIS — B964 Proteus (mirabilis) (morganii) as the cause of diseases classified elsewhere: Secondary | ICD-10-CM

## 2018-08-07 DIAGNOSIS — R8271 Bacteriuria: Secondary | ICD-10-CM

## 2018-08-07 DIAGNOSIS — B952 Enterococcus as the cause of diseases classified elsewhere: Secondary | ICD-10-CM

## 2018-08-07 LAB — CBC
HEMATOCRIT: 26 % — AB (ref 39.0–52.0)
HEMOGLOBIN: 7.5 g/dL — AB (ref 13.0–17.0)
MCH: 25.5 pg — AB (ref 26.0–34.0)
MCHC: 28.8 g/dL — ABNORMAL LOW (ref 30.0–36.0)
MCV: 88.4 fL (ref 80.0–100.0)
Platelets: 350 10*3/uL (ref 150–400)
RBC: 2.94 MIL/uL — AB (ref 4.22–5.81)
RDW: 15.3 % (ref 11.5–15.5)
WBC: 15.6 10*3/uL — ABNORMAL HIGH (ref 4.0–10.5)
nRBC: 0 % (ref 0.0–0.2)

## 2018-08-07 LAB — BASIC METABOLIC PANEL
Anion gap: 9 (ref 5–15)
BUN: 21 mg/dL (ref 8–23)
CHLORIDE: 108 mmol/L (ref 98–111)
CO2: 19 mmol/L — AB (ref 22–32)
Calcium: 9.4 mg/dL (ref 8.9–10.3)
Creatinine, Ser: 1.44 mg/dL — ABNORMAL HIGH (ref 0.61–1.24)
GFR calc non Af Amer: 46 mL/min — ABNORMAL LOW (ref >60)
GFR, EST AFRICAN AMERICAN: 54 mL/min — AB (ref >60)
Glucose, Bld: 81 mg/dL (ref 70–99)
POTASSIUM: 3.4 mmol/L — AB (ref 3.5–5.1)
SODIUM: 136 mmol/L (ref 135–145)

## 2018-08-07 LAB — CULTURE, BLOOD (ROUTINE X 2)
CULTURE: NO GROWTH
CULTURE: NO GROWTH
SPECIAL REQUESTS: ADEQUATE

## 2018-08-07 LAB — GLUCOSE, CAPILLARY: Glucose-Capillary: 81 mg/dL (ref 70–99)

## 2018-08-07 MED ORDER — SODIUM CHLORIDE 0.9 % IV SOLN
3.0000 g | Freq: Four times a day (QID) | INTRAVENOUS | Status: DC
  Administered 2018-08-07 – 2018-08-21 (×56): 3 g via INTRAVENOUS
  Filled 2018-08-07 (×59): qty 3

## 2018-08-07 MED ORDER — POTASSIUM CHLORIDE CRYS ER 20 MEQ PO TBCR
20.0000 meq | EXTENDED_RELEASE_TABLET | Freq: Once | ORAL | Status: AC
  Administered 2018-08-07: 20 meq via ORAL
  Filled 2018-08-07: qty 1

## 2018-08-07 NOTE — Progress Notes (Signed)
   Subjective: Jerry [name] reported feeling weak this morning.  He said he feels about the same since he came into the hospital.  Had any fevers abdominal pain nausea or vomiting.  Expressed concern to receiving contrast for CT abdomen given that he has one kidney.  All questions and concerns were addressed.  Objective:  Vital signs in last 24 hours: Vitals:   08/06/18 1434 08/06/18 2004 08/06/18 2132 08/07/18 0519  BP: (!) 87/63 (!) 95/54 (!) 100/53 (!) 99/41  Pulse: 70  75 77  Resp:   17 17  Temp: 98.9 F (37.2 C) 99.7 F (37.6 C) 98.2 F (36.8 C) 97.9 F (36.6 C)  TempSrc: Oral Oral Oral Oral  SpO2: 99%  97% 97%  Weight:      Height:       Physical Exam  Constitutional: He is oriented to person, place, and time and well-developed, well-nourished, and in no distress.  Cardiovascular: Normal rate, regular rhythm and normal heart sounds.  No murmur heard. Pulmonary/Chest: Effort normal and breath sounds normal. No respiratory distress. He has no wheezes.  Abdominal: Soft. Bowel sounds are normal. He exhibits no distension. There is no tenderness.  Musculoskeletal: He exhibits no edema.  Neurological: He is alert and oriented to person, place, and time.  Skin: Skin is warm and dry.    Assessment/Plan:  Active Problems:   Psoas abscess, left (HCC)  Sepsis secondary to psoas abscess  WBC 15. He continues to be afebrile. IR drains still in place, output ~30 cc.Abscess blood cultures show moderate proteus mirabilis, moderate klebsiella pneumoniae; holding cultures for possible anaerobes. Will most likely proceed with cefepime and metronidazole, and discontinue vancomycin. Will wait on ID recommendations. - ID recommended CT abdomen without contrast to evaluate for colonic fistula to retroperitoneal collection as possible source; pt expressed concerns due to only having one kidney; will discuss alternative options with ID and/or buffer with NS fluids before and after contrast  administration; appreciate recommendations - antibiotics per ID - will consider PICC line placement depending on course of abx treatment - IR managing drain care   Acute on chronic CKD UTI Improving, creatinine1.4 baseline appears to be 1.3. Suprapubic catheter in place without signs of infection.UTI grew Pseudomonas. Will most likely continue with cefepime - f/u ID recommendations  Normocytic anemia Hgb 7.5, stable; will monitor -cbc in am -repeat stat H&H if changes in vitals  Hypokalemia - K 3.4, replace orally with 20 meq KCl - f/u am bmp  Hx of HFpEF Used to be on Lasix PO 20mg  daily at home - Monitor for signs of hypervolemia  Dispo: Anticipated discharge in approximately 2-3 days.  Mike Craze, DO 08/07/2018, 7:19 AM Pager: [phone]

## 2018-08-07 NOTE — Progress Notes (Signed)
    [facility] for Infectious Disease    Date of Admission:  08/02/2018   Total days of antibiotics 6          ID: Jerry [name] is a 72 y.o. male with massive retroperitoneal abscess concerning for vesiculo or colon related fistula Active Problems:   Psoas abscess, left (HCC)  Subjective: Afebrile. Concerned about getting picc line since he previously had dislodgement, difficulty with maintaning patency  Medications:  . acetaminophen  500 mg Oral Q4H   Or  . acetaminophen  650 mg Rectal Q4H  . enoxaparin (LOVENOX) injection  40 mg Subcutaneous Q24H  . feeding supplement  1 Container Oral TID BM  . sodium chloride flush  5 mL Intracatheter Q8H    Objective: Vital signs in last 24 hours: Temp:  [97.9 F (36.6 C)-98.4 F (36.9 C)] 98.4 F (36.9 C) (10/23 1508) Pulse Rate:  [73-83] 73 (10/23 1508) Resp:  [17-22] 22 (10/23 1508) BP: (93-99)/(41-56) 94/54 (10/23 1508) SpO2:  [97 %-99 %] 98 % (10/23 1508) Physical Exam  Constitutional: He is oriented to person, place, and time. He appears chronically ill. No distress.  HENT:  Mouth/Throat: Oropharynx is clear and moist. No oropharyngeal exudate.  Cardiovascular: Normal rate, regular rhythm and normal heart sounds. Exam reveals no gallop and no friction rub.  No murmur heard.  Pulmonary/Chest: Effort normal and breath sounds normal. No respiratory distress. He has no wheezes.  Abdominal: Soft. Bowel sounds are normal. He exhibits no distension. There is no tenderness.  Back: left sided drains-feculant material in the bulb Neurological: He is alert and oriented to person, place, and time.  Skin: Skin is warm and dry. No rash noted. No erythema.  Psychiatric: He has a normal mood and affect. His behavior is normal.    Lab Results Recent Labs    08/06/18 0341 08/07/18 0420  WBC 15.3* 15.6*  HGB 7.4* 7.5*  HCT 25.1* 26.0*  NA 136 136  K 4.0 3.4*  CL 110 108  CO2 19* 19*  BUN 22 21  CREATININE 1.51* 1.44*     Microbiology: reviewed Studies/Results: [place] Ekg Site Rite  Result Date: 08/06/2018 If Site Rite image not attached, placement could not be confirmed due to current cardiac rhythm.    Assessment/Plan: Retroperitoneal abscess = recommend ct with oral contrast to see if there is communication between colon and retroperitoneal fluid collection. High suspicion for fistula. Will narrow abtx to amp/sub for now. Can d/c vancomycin. If imaging suggests fistula, would recommend general surgery input with management  Polymicrobial urine cx = he is colonized with numerous pathogens. Will not treat unless new symptoms arise.he is due to have his suprapubic catheter change next week.   [facility] Infectious Diseases Cell: [phone] Pager: [phone]  08/07/2018, 10:05 PM

## 2018-08-07 NOTE — Progress Notes (Signed)
Referring Physician(s): Dr. Lysbeth Galas  Supervising Physician: Corrie Mckusick  Patient Status:  [facility] - In-pt  Chief Complaint: Follow-up retroperitoneal abscess drains x 2 placed   Subjective:  Patient s/p retroperitoneal abscess drain placement in IR on 10/20 by Dr. Earleen Newport. Patient reports that he has back pain due to history of rod placement. He states he is frustrated that some staff members enter his room and ask him "what's going on" because he states he does not know exactly what's going on. He does know that he had drains placed for abscesses but he would like to know how he got the abscesses and no one can answer that for him. He also reports he is supposed to go for a CT scan later today to "investigate why I'm like this" but refuses to have contrast because he only has one kidney. He states he has extensive medical history and has had 3 previous PICC lines which all became infected eventually and this is why he does not want another PICC line.  He states his appetite is not very good and he is tired. He denies any other complaints.    Allergies: Patient has no known allergies.  Medications: Prior to Admission medications   Medication Sig Start Date End Date Taking? Authorizing Provider  acetaminophen (TYLENOL) 325 MG tablet Take 650 mg by mouth 3 (three) times daily.   Yes [provider]  polyethylene glycol (MIRALAX) packet Take 17 g by mouth 2 (two) times daily. Patient not taking: Reported on 08/02/2018 09/26/17   Modena Jansky, MD  senna (SENOKOT) 8.6 MG TABS tablet Take 2 tablets (17.2 mg total) by mouth at bedtime as needed for mild constipation or moderate constipation. Patient not taking: Reported on 08/02/2018 09/26/17   Modena Jansky, MD  vitamin B-12 (CYANOCOBALAMIN) 1000 MCG tablet Take 2 tablets (2,000 mcg total) by mouth daily. Patient not taking: Reported on 08/02/2018 09/26/17   Modena Jansky, MD     Vital Signs: BP (!) 95/53 (BP  Location: Right Arm)   Pulse 83   Temp 97.9 F (36.6 C) (Oral)   Resp 20   Ht 5\' 11"  (1.803 m)   Wt 228 lb 13.4 oz (103.8 kg)   SpO2 99%   BMI 31.92 kg/m   Physical Exam  Constitutional: No distress.  HENT:  Head: Normocephalic.  Cardiovascular: Normal rate, regular rhythm and normal heart sounds.  Pulmonary/Chest: Effort normal and breath sounds normal.  Abdominal: Soft. He exhibits no distension. There is no tenderness.  Neurological: He is alert.  Skin: Skin is warm and dry. He is not diaphoretic.  Psychiatric: He has a normal mood and affect. His behavior is normal. Judgment and thought content normal.  Nursing note reviewed.   Imaging: Ct Abdomen Pelvis Wo Contrast  Result Date: 08/04/2018 CLINICAL DATA:  72 year old male with a history of retroperitoneal abscess. EXAM: CT ABDOMEN AND PELVIS WITHOUT CONTRAST TECHNIQUE: Multidetector CT imaging of the abdomen and pelvis was performed following the standard protocol without IV contrast. COMPARISON:  No prior CT. Plain film 08/03/2018, CT 02/23/2018, 02/23/2018, 01/01/2018 FINDINGS: Lower chest: Atelectasis of the inferior lungs. Coronary artery disease and valve calcifications. Hepatobiliary: Unremarkable appearance of the liver. Cholelithiasis. No pericholecystic fluid or inflammatory changes. Pancreas: Unremarkable Spleen: Unremarkable Adrenals/Urinary Tract: Unremarkable adrenal glands. Double-J right ureteral stent with the proximal loop formed in the collecting system and the distal loop formed in the distal ureter. Nephrolithiasis of the right collecting system measuring 15 mm, 10 mm,  15 mm. No perinephric stranding on the right. Suprapubic catheter within the urinary bladder with bladder decompressed. Surgical changes of left nephrectomy. Fluid and gas collection in the left retroperitoneal space, continuous with the psoas muscle medially and the lateral abdominal wall laterally measuring 111 mm by 75 mm on the axial images.  Flocculent material at the superior aspect of the fluid collection. The left colon is displaced anteriorly. Uncertain if there is a connection between left colon and the abscess cavity on this CT image. Flocculent material within the left-sided flank measures 138 mm with no definite connection to the retroperitoneal space. Stomach/Bowel: Hiatal hernia. Unremarkable stomach. Small bowel partially distended with air-fluid levels. No transition point. Moderate stool burden. No significantly distended colon. Colonic diverticular change without associated inflammatory changes. Appendix is not visualized, however, no inflammatory changes are present adjacent to the cecum to indicate an appendicitis. Vascular/Lymphatic: Calcifications of the abdominal aorta. No aneurysm. Calcifications of the iliac arteries. Reproductive: Unremarkable prostate. Other: None Musculoskeletal: Multilevel degenerative changes of the visualized thoracolumbar spine. No acute displaced fracture. Degenerative changes of bilateral hips. Surgical changes of thoracic laminectomy IMPRESSION: Fluid and gas collection of the left retroperitoneal space, potentially abscess, hematoma, or seroma/lymphocele. Status post left nephrectomy. Flocculent material in the left flank, concerning for abscess. Right-sided ureteral stent with the proximal loop formed in the collecting system and the distal loop formed in the distal ureter. Referral for follow-up urologic evaluation is recommended. Multiple nonobstructing stones of the right collecting system. Cholelithiasis without evidence of acute inflammatory changes. Diverticular disease without evidence of acute diverticulitis. Moderate stool burden with no evidence of obstruction. Aortic Atherosclerosis (ICD10-I70.0). Electronically Signed   By: Corrie Mckusick D.O.   On: 08/04/2018 08:37   Dg Abd 1 View  Result Date: 08/03/2018 CLINICAL DATA:  Chronic generalized abdominal pain. Ureteral stent displacement.  EXAM: ABDOMEN - 1 VIEW COMPARISON:  02/26/2018 FINDINGS: Nonobstructive/nonspecific bowel gas pattern. Paucity of gas in the left abdomen with gas collection overlying the left flank. Right-sided ureteral stent with proximal tip in the expected location of the right renal pelvis and distal tip in the right pelvis. IMPRESSION: Unchanged positioning of the right ureteral stent. Nonspecific bowel gas pattern. Gas collection overlies the left flank. Electronically Signed   By: Fidela Salisbury M.D.   On: 08/03/2018 17:34   Ct Image Guided Drainage By Percutaneous Catheter  Result Date: 08/04/2018 INDICATION: 72 year old male with retroperitoneal abscess EXAM: CT-GUIDED FLANK ABSCESS DRAINAGE CT-GUIDED LEFT RETROPERITONEAL ABSCESS DRAINAGE MEDICATIONS: The patient is currently admitted to the hospital and receiving intravenous antibiotics. The antibiotics were administered within an appropriate time frame prior to the initiation of the procedure. ANESTHESIA/SEDATION: Fentanyl 2.0 mcg IV; Versed 100 mg IV Moderate Sedation Time:  22 minutes The patient was continuously monitored during the procedure by the interventional radiology nurse under my direct supervision. COMPLICATIONS: None PROCEDURE: Informed written consent was obtained from the patient after a thorough discussion of the procedural risks, benefits and alternatives. All questions were addressed. Maximal Sterile Barrier Technique was utilized including caps, mask, sterile gowns, sterile gloves, sterile drape, hand hygiene and skin antiseptic. A timeout was performed prior to the initiation of the procedure. Patient position prone position on CT gantry table. Scout CT of the abdomen was performed for planning purposes. The patient was then prepped and draped in the usual sterile fashion. 1% lidocaine was used for local anesthesia. Using modified Seldinger technique, coaxial trocar needle placed into the flank abscess in the retroperitoneal abscess.  Twelve [place]  drain was placed within the retroperitoneal abscess in the flank abscess. Approximately 30 cc of purulent material aspirated from the flank abscess with a sample sent for culture. Approximately 800 cc of purulent material aspirated from the retroperitoneal abscess. Sample sent to the lab for analysis. Both Reamer attached to bulb suction. Sterile dressings were placed. Patient tolerated the procedure well and remained hemodynamically stable throughout. No complications were encountered and no significant blood loss. FINDINGS: After drainage, there is flocculent material persisting within the flank. After drainage, there is significant reduction in the fluid abscess of the retroperitoneum with small gas persisting. IMPRESSION: Status post CT-guided drainage of left retroperitoneal abscess with a 12 French drain as well as a left-sided flank abscess with a 12 [place] drain. Signed, Dulcy Fanny. Dellia Nims, RPVI [facility] Electronically Signed   By: Corrie Mckusick D.O.   On: 08/04/2018 13:53   [place] Ekg Site Rite  Result Date: 08/06/2018 If Site Rite image not attached, placement could not be confirmed due to current cardiac rhythm.   Labs:  CBC: Recent Labs    08/04/18 0353 08/05/18 0525 08/06/18 0341 08/07/18 0420  WBC 17.4* 16.0* 15.3* 15.6*  HGB 7.1* 7.9* 7.4* 7.5*  HCT 24.3* 26.7* 25.1* 26.0*  PLT 301 295 317 350    COAGS: Recent Labs    08/03/18 0603  INR 1.36  APTT 43*    BMP: Recent Labs    08/04/18 0353 08/05/18 0525 08/06/18 0341 08/07/18 0420  NA 138 136 136 136  K 2.8* 3.2* 4.0 3.4*  CL 111 112* 110 108  CO2 20* 19* 19* 19*  GLUCOSE 90 91 96 81  BUN 28* 25* 22 21  CALCIUM 9.6 9.4 9.4 9.4  CREATININE 1.88* 1.60* 1.51* 1.44*  GFRNONAA 34* 41* 44* 46*  GFRAA 39* 47* 51* 54*    LIVER FUNCTION TESTS: Recent Labs    09/21/17 0743 08/02/18 1921  BILITOT 1.1 0.5  AST 39 20  ALT 19 15  ALKPHOS  70 71  PROT 7.4 7.1  ALBUMIN 2.7* 1.9*    Assessment and Plan:  S/p retroperitoneal abscess drains x 2 placed on 10/20 with Dr. Earleen Newport - cultures so far have grown proteus and klebsiella, he is currently on vancomycin, cefepime and flagyl per ID. Recent UA positive for staph.  Output from left JP drain ~ 20 cc - appears feculent; scant drainage during exam in gravity bag - 10 cc recorded in last 24 hours. Patient is afebrile, WBC about the same at 15.6, H/H 7.5/26.0 - followed by primary team.  Continue TID flushes with 3-5 cc NS, record output from each drain daily.  IR to continue to follow - once output is minimal will obtain CT with possible injection to assess for possible removal. Per most recent primary team note they are considering CT soon to evaluate for evidence of fistula.   Electronically Signed: Joaquim Nam, PA-C 08/07/2018, 10:58 AM   I spent a total of 15 Minutes at the the patient's bedside AND on the patient's hospital floor or unit, greater than 50% of which was counseling/coordinating care for retroperitoneal abscess drains.

## 2018-08-07 NOTE — Clinical Social Work Note (Signed)
Clinical Social Work Assessment  Patient Details  Name: Jerry [name] MRN: [id number] Date of Birth: 1946/04/22  Date of referral:  08/07/18               Reason for consult:  Discharge Planning                Permission sought to share information with:  Facility Sport and exercise psychologist, Family Supports Permission granted to share information::  Yes, Verbal Permission Granted  Name::     [name]  Agency::  [facility]  Relationship::  POA  Contact Information:  [phone]  Housing/Transportation Living arrangements for the past 2 months:  [place] of Information:  Patient, Other (Comment Required)(POA) Patient Interpreter Needed:  None Criminal Activity/Legal Involvement Pertinent to Current Situation/Hospitalization:  No - Comment as needed Significant Relationships:  Other Family Members Lives with:  Facility Resident Do you feel safe going back to the place where you live?  Yes Need for family participation in patient care:  Yes (Comment)  Care giving concerns:  CSW received consult regarding discharge planning. CSW spoke with patient's POA, [name]. She reported that patient is a long term care resident at [facility] and will return at discharge. However, she reports that she was told patient will be remain in the hospital for the course of his iv antibiotics and then he will be transferred to [facility]. CSW alerted her that it will be up to the physicians on discharge timing. CSW to continue to follow and assist with discharge planning needs.   Social Worker assessment / plan:  CSW spoke with patient's POA concerning return to [facility].   Employment status:  Retired Forensic scientist:  Medicare PT Recommendations:  [facility] / Referral to community resources:  [place]  Patient/Family's Response to care:  Patient's POA responded negatively to CSW's call. She reported that she did not understand why CSW  needed to be involved. CSW explained that CSW will be the one assisting with his transfer back to [facility], but she stated he would not be returning there for a long time. CSW will continue to follow patient for medical readiness.   Patient/Family's Understanding of and Emotional Response to Diagnosis, Current Treatment, and Prognosis:  Patient/family is realistic regarding therapy needs and expressed being hopeful for SNF placement. At the end of the conversation, patient's POA finally expressed understanding of CSW role and discharge process as well as medical condition. No questions/concerns about plan or treatment.    Emotional Assessment Appearance:  Appears stated age Attitude/Demeanor/Rapport:  (Appropriate) Affect (typically observed):  Appropriate, Accepting Orientation:  Oriented to Self, Oriented to Place, Oriented to  Time, Oriented to Situation Alcohol / Substance use:  Not Applicable Psych involvement (Current and /or in the community):  No (Comment)  Discharge Needs  Concerns to be addressed:  Care Coordination Readmission within the last 30 days:  No Current discharge risk:  None Barriers to Discharge:  Continued Medical Work up   [employer], LCSW 08/07/2018, 12:28 PM

## 2018-08-07 NOTE — Progress Notes (Signed)
Internal Medicine Attending:   I saw and examined the patient. I reviewed the resident's note and I agree with the resident's findings and plan as documented in the resident's note.  Patient complains of persistent weakness today.  Patient was initially admitted to the hospital with sepsis secondary to a large left retroperitoneal and left psoas abscess. He is now status post I&D by IR with drain placement.  ID consult appreciated.  Will consider CT abdomen with oral contrast to look for evidence of fistula as etiology of the abscess remains uncertain at this time.  Abscess culture growing Proteus as well as Klebsiella.  Will DC current antibiotics and start patient on Unasyn.  Patient also noted to have a positive urine culture growing Pseudomonas, staph aureus as well as enterococcus faecalis.  This is likely colonization.  We will not treat for now.  If patient becomes febrile or worsens clinically would consider covering for these organisms as well.  No further work-up at this time.  Creatinine appears to be close to baseline.  We will continue to monitor closely.  Of note, patient does have anemia with hemoglobin of 7.5 which has remained stable.  This is possibly secondary to his CKD as well as his ongoing infection.  Iron studies with low serum iron, low TIBC and an elevated ferritin which is consistent with anemia of chronic disease.  We will continue to monitor closely for now.

## 2018-08-08 LAB — BASIC METABOLIC PANEL
ANION GAP: 6 (ref 5–15)
BUN: 22 mg/dL (ref 8–23)
CALCIUM: 9.1 mg/dL (ref 8.9–10.3)
CO2: 20 mmol/L — ABNORMAL LOW (ref 22–32)
CREATININE: 1.43 mg/dL — AB (ref 0.61–1.24)
Chloride: 109 mmol/L (ref 98–111)
GFR, EST AFRICAN AMERICAN: 54 mL/min — AB (ref >60)
GFR, EST NON AFRICAN AMERICAN: 47 mL/min — AB (ref >60)
Glucose, Bld: 89 mg/dL (ref 70–99)
POTASSIUM: 3.6 mmol/L (ref 3.5–5.1)
SODIUM: 135 mmol/L (ref 135–145)

## 2018-08-08 LAB — CBC
HEMATOCRIT: 24.8 % — AB (ref 39.0–52.0)
HEMOGLOBIN: 7.5 g/dL — AB (ref 13.0–17.0)
MCH: 26.6 pg (ref 26.0–34.0)
MCHC: 30.2 g/dL (ref 30.0–36.0)
MCV: 87.9 fL (ref 80.0–100.0)
NRBC: 0 % (ref 0.0–0.2)
Platelets: 335 10*3/uL (ref 150–400)
RBC: 2.82 MIL/uL — AB (ref 4.22–5.81)
RDW: 15 % (ref 11.5–15.5)
WBC: 13.3 10*3/uL — AB (ref 4.0–10.5)

## 2018-08-08 LAB — GLUCOSE, CAPILLARY: GLUCOSE-CAPILLARY: 82 mg/dL (ref 70–99)

## 2018-08-08 NOTE — Progress Notes (Signed)
Referring Physician(s): Dr. Dareen Piano  Supervising Physician: Sandi Mariscal  Patient Status:  [facility] - In-pt  Chief Complaint: Follow up retroperitoneal abscess drains x 2 placed 10/20 by Dr. Earleen Newport  Subjective:  Patient shrugs his shoulders when asked how he's feeling today, states pain is "same as always" and that his appetite continues to be poor. He states jokingly "I think I need more tests, there's a couple on the menu you guys haven't run yet." Visitor at bedside questioning if I am taking him down for study today - patient states he was told he will have CT with contrast enema today or tomorrow. Visitor states she was told it would be today and is concerned that contrast will damage his only kidney. Patient reports drains were emptied this morning and RNs are flushing. He again makes sure to tell me that he will not have another PICC placed.  Allergies: Patient has no known allergies.  Medications: Prior to Admission medications   Medication Sig Start Date End Date Taking? Authorizing Provider  acetaminophen (TYLENOL) 325 MG tablet Take 650 mg by mouth 3 (three) times daily.   Yes [provider]  polyethylene glycol (MIRALAX) packet Take 17 g by mouth 2 (two) times daily. Patient not taking: Reported on 08/02/2018 09/26/17   Modena Jansky, MD  senna (SENOKOT) 8.6 MG TABS tablet Take 2 tablets (17.2 mg total) by mouth at bedtime as needed for mild constipation or moderate constipation. Patient not taking: Reported on 08/02/2018 09/26/17   Modena Jansky, MD  vitamin B-12 (CYANOCOBALAMIN) 1000 MCG tablet Take 2 tablets (2,000 mcg total) by mouth daily. Patient not taking: Reported on 08/02/2018 09/26/17   Modena Jansky, MD     Vital Signs: BP (!) 90/54 (BP Location: Right Arm)   Pulse 63   Temp 97.9 F (36.6 C) (Oral)   Resp 18   Ht 5\' 11"  (1.803 m)   Wt 228 lb 13.4 oz (103.8 kg)   SpO2 95%   BMI 31.92 kg/m   Physical Exam  Constitutional: No  distress.  HENT:  Head: Normocephalic.  Cardiovascular: Normal rate, regular rhythm and normal heart sounds.  Pulmonary/Chest: Effort normal and breath sounds normal.  Abdominal: Soft. There is no tenderness.  Neurological: He is alert.  Skin: Skin is warm and dry. He is not diaphoretic.  Left JP insertion site clean, dry, intact - scant tan output on exam; left gravity bag insertion site clean, dry, intact - scant tan output on exam.  Psychiatric: He has a normal mood and affect. His behavior is normal. Judgment and thought content normal.    Imaging: Mr Abdomen Wo Contrast  Result Date: 08/08/2018 CLINICAL DATA:  Retroperitoneal abscess, query fistula. EXAM: MRI ABDOMEN WITHOUT CONTRAST TECHNIQUE: Multiplanar multisequence MR imaging was performed without the administration of intravenous contrast. COMPARISON:  CT examinations of 08/04/2018 and 08/03/2018 FINDINGS: Lower chest: Elevated right hemidiaphragm. Right lower lobe airspace opacity, probably mostly atelectasis. Thoracic spondylosis. Trace bilateral pleural effusions. Hepatobiliary: A gallstone the gallbladder measuring 5.3 cm in length fills. There several other smaller gallstones. No biliary dilatation. No focal hepatic lesion on today's noncontrast assessment. Pancreas: Two T2 signal hyperintensities along the pancreatic tail measuring in the 4 mm diameter range are probably small dilated side ducts but technically nonspecific. No dilation of the dorsal pancreatic duct. Spleen: Triangular signal hypointensity posteriorly in the spleen on image 164/9 with associated calcification on CT, probably a postinflammatory finding or small remote splenic infarct. Nonspecific 9 by  7 mm T2 hyperintense lesion medially in the spleen on image 22/10, without high signal on high B value diffusion-weighted images. Adrenals/Urinary Tract: Absent left kidney. An abnormal fluid collection of the left posterior retroperitoneum also contains gas and is most  compatible with an abscess. This extends into the pelvis and below the inferior margin of imaging on today's MRI of the abdomen. There is a pigtail catheter extending into this collection. There is mild right hydronephrosis. A right ureteral stent is present with proximal coil in the collecting system. Filling defects in the right collecting system are compatible with nephrolithiasis and most notable in the lower pole. There are at least 20 lesions the right kidney, most of which are cysts. Some of the lesions are complex, with high precontrast T1 signal and/or low T2 signal. Enhancement characteristics are not assessed today. The right proximal ureter does not appear dilated and in fact appears potentially narrowed around the stent. The adrenal glands appear normal. Stomach/Bowel: No dilated bowel identified. The descending colon skirts the margin of the retroperitoneal abscess although I am skeptical of a connection giving the difference in the appearance of contents of the colon and the abscess. Contrast enema would be more definitive in ensuring that there is no connection. No dilated bowel in the upper abdomen. Vascular/Lymphatic:  Aortoiliac atherosclerotic vascular disease. Other: In addition to the left retroperitoneal drain, there is a pigtail catheter within a cavity in the subcutaneous tissues of the left posterior flank. It is difficult to separate how much of this cavity is phlegmon from how much is actual abscess, and the cavity does appears smaller than on 08/04/2018 when the catheter was placed. However, the cavity may measure as much as 4.0 by 14.8 by 3.6 cm today, and there is an air-fluid level component anteriorly. With respect to a direct connection between the retroperitoneal cavity and the subcutaneous cavity, certainly the retroperitoneal drain extends through the flank cavity and into the retroperitoneum, and tracking along the margin of the drain would be a possibility. Otherwise I do not  directly visualize a connection, although sensitivity for small connections would be low. Musculoskeletal: Thoracolumbar spondylosis IMPRESSION: 1. Large retroperitoneal fluid collection with air-fluid level persists on the left. There continues to be a pigtail drain in place. I do not see an obvious connection to the immediately adjacent descending colon, and the contents of the colon and the collection appear different, however contrast enema would be more definitive in ensuring that there is no connection. This collection extends down into the pelvis and thus was not fully covered on today's MRI of the upper abdomen without contrast. 2. Other imaging findings of potential clinical significance: Elevated right hemidiaphragm. Right lower lobe airspace opacity favoring atelectasis. Trace bilateral pleural effusions. Large gallstone fills most of the gallbladder. 3. There is also a subcutaneous abscess along the left flank, with abscess and surrounding phlegmon measuring about 4.0 by 14.8 by 3.6 cm today. The drainage catheter extending into the left retroperitoneum penetrates through this collection. The left flank subcutaneous collection also has its own pigtail drainage catheter. Two miniscule fluid signal intensity lesions along the pancreatic tail are likely postinflammatory. Remote splenic infarct. Small T2 hyperintense lesion in the spleen is nonspecific but likely benign. Aortic Atherosclerosis (ICD10-I70.0). Electronically Signed   By: Van Clines M.D.   On: 08/08/2018 10:52   [place] Ekg Site Rite  Result Date: 08/06/2018 If Site Rite image not attached, placement could not be confirmed due to current cardiac rhythm.  Labs:  CBC: Recent Labs    08/05/18 0525 08/06/18 0341 08/07/18 0420 08/08/18 0342  WBC 16.0* 15.3* 15.6* 13.3*  HGB 7.9* 7.4* 7.5* 7.5*  HCT 26.7* 25.1* 26.0* 24.8*  PLT 295 317 350 335    COAGS: Recent Labs    08/03/18 0603  INR 1.36  APTT 43*     BMP: Recent Labs    08/05/18 0525 08/06/18 0341 08/07/18 0420 08/08/18 0342  NA 136 136 136 135  K 3.2* 4.0 3.4* 3.6  CL 112* 110 108 109  CO2 19* 19* 19* 20*  GLUCOSE 91 96 81 89  BUN 25* 22 21 22   CALCIUM 9.4 9.4 9.4 9.1  CREATININE 1.60* 1.51* 1.44* 1.43*  GFRNONAA 41* 44* 46* 47*  GFRAA 47* 51* 54* 54*    LIVER FUNCTION TESTS: Recent Labs    09/21/17 0743 08/02/18 1921  BILITOT 1.1 0.5  AST 39 20  ALT 19 15  ALKPHOS 70 71  PROT 7.4 7.1  ALBUMIN 2.7* 1.9*    Assessment and Plan:  S/p retroperitoneal abscess drains x 2 placed on 10/20 with Dr. Earleen Newport - cultures so far have grown proteus and klebsiella, he is currently on vancomycin, cefepime and flagyl per ID. Recent UA positive for staph   Output from left JP 40 cc in last 24 hours, left gravity bag 25 cc in last 24 hours. MR abdomen w/o contrast  performed performed yesterday to assess for colonic fistula - both drains appear to be in appropriate position and no fistula was seen. Possible contrast enema study per patient and visitor - this will be decided on by primary team.  Continues to be afebrile, leukocytosis and creatinine continue to improve.  Continue current management - TID flushes with 3-5 cc NS, record output from each drain daily. Discharge per admitting service - patient will follow up with IR in outpatient clinic after d/c for imaging/possible injection.   Electronically Signed: Joaquim Nam, PA-C 08/08/2018, 3:15 PM   I spent a total of 15 Minutes at the the patient's bedside AND on the patient's hospital floor or unit, greater than 50% of which was counseling/coordinating care for retroperitoneal abscess drains x 2.

## 2018-08-08 NOTE — Progress Notes (Signed)
Internal Medicine Attending:   I saw and examined the patient. I reviewed the resident's note and I agree with the resident's findings and plan as documented in the resident's note.  Patient still complains of generalized weakness and has some pain in his back at the site of the drains.  Patient was initially admitted to the hospital for a large left retroperitoneal and left psoas abscess and stenosis status post drain placement by IR.  Leukocytosis continues to improve.  He remains afebrile for now.  He still has significant drain output.  Abscess cultures are growing Proteus as well as Klebsiella.  We will continue with Unasyn for now per ID.  The etiology of his abscess remains unclear and there was concern for possible fistula from the colon.  Patient had an MRI done yesterday to evaluate for possible colonic fistula.  The MRI showed no definitive evidence of fistula but he still has persistent large left retroperitoneal fluid collection as well as left flank abscess with drains in place.  Patient may benefit from a contrast enema to evaluate for colonic fistula.  His creatinine continues to improve and is close to his baseline.  No further work-up at this time.  We will continue to monitor him closely.

## 2018-08-08 NOTE — Progress Notes (Signed)
Per patient his last BM was on 10/21. Writer asked if he would like to have something to help with BM but he refused. He said he didn't eat in the last few days and he doesn't have any discomfort due to constipation. Will continue to monitor.

## 2018-08-08 NOTE — Progress Notes (Signed)
   Subjective: Mr. Parcel said he was waiting this morning for the results of his MRI. He still reported feeling weak and having some back soreness from the drains. Denied any overnight fevers, n/v or abdominal pain.  Objective:  Vital signs in last 24 hours: Vitals:   08/07/18 1300 08/07/18 1508 08/07/18 2220 08/08/18 0516  BP: (!) 93/56 (!) 94/54 (!) 92/52 (!) 90/54  Pulse: 76 73 76 63  Resp:  (!) 22 18 18   Temp: 98.3 F (36.8 C) 98.4 F (36.9 C) 97.9 F (36.6 C) 97.9 F (36.6 C)  TempSrc: Oral Oral Oral Oral  SpO2: 98% 98% 96% 95%  Weight:      Height:       Physical Exam  Constitutional: He is oriented to person, place, and time and well-developed, well-nourished, and in no distress.  Cardiovascular: Normal rate, regular rhythm and normal heart sounds.  No murmur heard. Pulmonary/Chest: Effort normal and breath sounds normal. No respiratory distress. He has no wheezes.  Abdominal: Soft. Bowel sounds are normal. He exhibits no distension. There is no tenderness.  Suprapubic catheter site clean, dry and non-erythematous  Neurological: He is alert and oriented to person, place, and time.  Skin: Skin is warm and dry.    Assessment/Plan:  Active Problems:   Psoas abscess, left (HCC)  Sepsis secondary to psoas abscess  - WBC 13, afebrile - drains still in place, output ~65cc in the past 24 hours - continue Unasyn per ID, appreciate recommendations  - f/u MRI to evaluate for possible colonic fistula - will consider PICC line placement depending on course of abx treatment - IR managing drain care  Acute on chronic CKD UTI - Cr 1.4, stable;baseline appears to be 1.3 - suprapubic catheter in place without signs of infection - polymicrobial urine culture, most likely colonization; will not treat unless new symptoms arise, per ID   Normocytic anemia Hgb 7.5, stable; will monitor -cbc in am -repeat stat H&H if changes in vitals  Hypokalemia -K 3.6, resolved    Hx of HFpEF Used to be on Lasix PO 20mg  daily at home - Monitor for signs of hypervolemia  Dispo: Anticipated discharge in approximately 2-3 day(s).   Mike Craze, DO 08/08/2018, 6:56 AM Pager: [phone]

## 2018-08-08 NOTE — Progress Notes (Signed)
Physical Therapy Treatment Patient Details Name: Jerry [name] MRN: [id number] DOB: 1946/08/29 Today's Date: 08/08/2018    History of Present Illness Jerry [name] is a 72yo male who comes to [facility] on 10/18 p 1W decreased appetite, lethargy, found to be septic from a psoas abscess. PTA pt has been at [facility] then SNF, reports unable to AMB past 9-31months, but was able to self propel in WC. Pt reports needing hoyer lift transfers from bed to [place].     PT Comments    PT attempting to progress mobility this session as patient desires to return to w/c level mobility. Patient declining to perform rolling or supine <> sit transfers at bed level. Session focused on PROM at B LE and bed positioning to prevent excessive external rotation and for comfort. PT to continue to follow acutely. Will continue to recommend SNF.     Follow Up Recommendations  SNF;Supervision/Assistance - 24 hour;Home health PT     Equipment Recommendations  None recommended by PT    Recommendations for Other Services       Precautions / Restrictions Precautions Precautions: Fall Precaution Comments: Left flank drains, suprapubic catheter.  Restrictions Weight Bearing Restrictions: No    Mobility  Bed Mobility Overal bed mobility: Needs Assistance Bed Mobility: (repositioning) Rolling: Max assist         General bed mobility comments: partially rolling to allow for bed repositioning and placement of pillows to prevent excessive R LE external rotation  Transfers                    Ambulation/Gait                 Stairs             Wheelchair Mobility    Modified Rankin (Stroke Patients Only)       Balance                                            Cognition Arousal/Alertness: Awake/alert Behavior During Therapy: WFL for tasks assessed/performed Overall Cognitive Status: Within Functional Limits for tasks assessed                                        Exercises General Exercises - Lower Extremity Ankle Circles/Pumps: PROM;Both;10 reps;Supine Gluteal Sets: Both;10 reps;AROM;Supine Heel Slides: PROM;Both;10 reps;Supine Hip ABduction/ADduction: PROM;Both;10 reps;Supine    General Comments General comments (skin integrity, edema, etc.): patient declining to work on transfers      Pertinent Vitals/Pain Pain Assessment: Faces Faces Pain Scale: Hurts little more Pain Location: generalized Pain Descriptors / Indicators: Grimacing;Discomfort Pain Intervention(s): Limited activity within patient's tolerance;Monitored during session;Repositioned    Home Living                      Prior Function            PT Goals (current goals can now be found in the care plan section) Acute Rehab PT Goals Patient Stated Goal: return to [facility] self propulsion PT Goal Formulation: With patient Time For Goal Achievement: 08/12/18 Potential to Achieve Goals: Fair Progress towards PT goals: Progressing toward goals    Frequency    Min 2X/week      PT Plan Current plan remains appropriate  Co-evaluation              AM-PAC PT "6 Clicks" Daily Activity  Outcome Measure  Difficulty turning over in bed (including adjusting bedclothes, sheets and blankets)?: Unable Difficulty moving from lying on back to sitting on the side of the bed? : Unable Difficulty sitting down on and standing up from a chair with arms (e.g., wheelchair, bedside commode, etc,.)?: Unable Help needed moving to and from a bed to chair (including a wheelchair)?: Total Help needed walking in hospital room?: Total Help needed climbing 3-5 steps with a railing? : Total 6 Click Score: 6    End of Session   Activity Tolerance: Patient tolerated treatment well Patient left: in bed;with call bell/phone within reach;with bed alarm set;with nursing/sitter in room Nurse Communication: Mobility status PT Visit Diagnosis: Muscle weakness (generalized)  (M62.81);Difficulty in walking, not elsewhere classified (R26.2)     Time: 1610-9604 PT Time Calculation (min) (ACUTE ONLY): 25 min  Charges:  $Therapeutic Exercise: 8-22 mins $Therapeutic Activity: 8-22 mins                     Lanney Gins, PT, DPT Supplemental Physical Therapist 08/08/18 3:04 PM Pager: [phone] Office: [phone]

## 2018-08-08 NOTE — Progress Notes (Signed)
Nutrition Follow-up  DOCUMENTATION CODES:   Non-severe (moderate) malnutrition in context of chronic illness, Obesity unspecified  INTERVENTION:   - Recommend feeding assistance with meals  - Magic cup BID with meals, each supplement provides 290 kcal and 9 grams of protein  - d/c Boost Breeze per pt preference  - Ordered pt's lunch tray  - Continue to encourage adequate PO intake  NUTRITION DIAGNOSIS:   Moderate Malnutrition related to chronic illness (CHF) as evidenced by mild fat depletion, moderate fat depletion, mild muscle depletion, moderate muscle depletion, percent weight loss (18.5% weight loss in 10 months).  New diagnosis  GOAL:   Patient will meet greater than or equal to 90% of their needs  Progressing  MONITOR:   Supplement acceptance, Weight trends, Labs, Skin, I & O's, PO intake  REASON FOR ASSESSMENT:   Malnutrition Screening Tool    ASSESSMENT:   72 yo Male with complicated urologic history presenting with malaise. He was found to have abscess within the left psoas on imaging with leukocytosis and elevated lactate. Unclear etiology of his psoas abscess. PMH significant for neurogenic bladder s/p suprapubic catheter placement, CHF.  10/20 - s/p IR guided aspiration of left retroperitoneal abscess and left flank abscess, 2 drains placed  Noted concern for possible colonic fistula. MRI yesterday showed no definitive evidence of fistula but remaining large fluid collection.  Spoke with pt at bedside who reports that being on antibiotics for such a long time has "wiped out" his appetite. Pt states that nothing seems appetizing and that he has lost his sense of taste even though his other senses are "elevated" per his report.  Pt states that he does not like any supplements that we have here. Pt shares that he tried the [employer] and had 1 but has not been able to have any additional supplements since that 1 even though they have been offered to him.  Pt reports that Ensure and other supplements make him feel nauseous.  Pt states that he is able to tolerate cream of wheat. RD ordered lunch tray per pt request: cream of wheat, yogurt, water, Magic Cup. Pt is willing to try Magic Cup to aid in adequate kcal and protein intake. Discussed importance of protein in healing. Pt states that his main focuses are "sleep, water, and protein."  Pt shares that he was on a modified texture diet with thickened liquids during a previous admission and that it was "terrible." Pt is worried about having a feeing tube placed and states that this is not something he would want to pursue.  Pt shares that he has been receiving rehab at [facility] and uses a [employer] fast streamer. Pt states that he has difficulty getting "set up" with meals. Recommend feeding assistance.  Pt with 52 lb weight loss over the last 10 months. This is an 18.5% weight loss which is significant for timeframe.  Wt Readings from Last 10 Encounters:  08/05/18 103.8 kg  02/06/18 115.5 kg  11/19/17 125.2 kg  11/09/17 125.2 kg  10/31/17 127.5 kg  10/22/17 127.6 kg  09/27/17 130.6 kg  09/21/17 130.6 kg  11/26/16 130.6 kg  08/11/14 (!) 139.3 kg    Meal Completion: 0-50%  Medications reviewed and include: Boost Breeze TID - pt refusing, IV Unasyn  Labs reviewed: creatinine 1.43 (H), hemoglobin 7.5 (L) CBG's: 82, 81, 88, 85 x 3 days  UOP: 1800 ml x 24 hours JP drains: 65 ml x 24 hours  NUTRITION - FOCUSED PHYSICAL EXAM:  Most Recent Value  Orbital Region  Mild depletion  Upper Arm Region  Mild depletion  Thoracic and Lumbar Region  Moderate depletion  Buccal Region  Mild depletion  Temple Region  Mild depletion  Clavicle Bone Region  Moderate depletion  Clavicle and Acromion Bone Region  Moderate depletion  Scapular Bone Region  Unable to assess  Dorsal Hand  Mild depletion  Patellar Region  Moderate depletion  Anterior Thigh Region  Moderate depletion  Posterior Calf Region   Moderate depletion  Edema (RD Assessment)  None  Hair  Reviewed  Eyes  Reviewed  Mouth  Reviewed  Skin  Reviewed  Nails  Reviewed     Pt is bedbound at baseline. Suspect bilateral muscle wasting related to bedbound status.  Diet Order:   Diet Order            Diet regular Room service appropriate? Yes; Fluid consistency: Thin  Diet effective now              EDUCATION NEEDS:   No education needs have been identified at this time  Skin:  Skin Assessment: Skin Integrity Issues: Other: MASD to buttocks  Last BM:  10/20  Height:   Ht Readings from Last 1 Encounters:  08/03/18 5\' 11"  (1.803 m)    Weight:   Wt Readings from Last 1 Encounters:  08/05/18 103.8 kg    Ideal Body Weight:  78.18 kg  BMI:  Body mass index is 31.92 kg/m.  Estimated Nutritional Needs:   Kcal:  1800-2000  Protein:  90-105 gm  Fluid:  1.8-2.0 L    Gaynell Face, MS, RD, LDN Inpatient Clinical Dietitian Pager: [phone] Weekend/After Hours: [phone]

## 2018-08-09 DIAGNOSIS — E440 Moderate protein-calorie malnutrition: Secondary | ICD-10-CM | POA: Diagnosis present

## 2018-08-09 LAB — CBC
HEMATOCRIT: 25.8 % — AB (ref 39.0–52.0)
Hemoglobin: 7.6 g/dL — ABNORMAL LOW (ref 13.0–17.0)
MCH: 26.1 pg (ref 26.0–34.0)
MCHC: 29.5 g/dL — AB (ref 30.0–36.0)
MCV: 88.7 fL (ref 80.0–100.0)
Platelets: 376 10*3/uL (ref 150–400)
RBC: 2.91 MIL/uL — ABNORMAL LOW (ref 4.22–5.81)
RDW: 15.3 % (ref 11.5–15.5)
WBC: 9.8 10*3/uL (ref 4.0–10.5)
nRBC: 0 % (ref 0.0–0.2)

## 2018-08-09 LAB — AEROBIC/ANAEROBIC CULTURE W GRAM STAIN (SURGICAL/DEEP WOUND)

## 2018-08-09 LAB — AEROBIC/ANAEROBIC CULTURE (SURGICAL/DEEP WOUND)

## 2018-08-09 LAB — GLUCOSE, CAPILLARY: Glucose-Capillary: 83 mg/dL (ref 70–99)

## 2018-08-09 LAB — BASIC METABOLIC PANEL
ANION GAP: 8 (ref 5–15)
BUN: 19 mg/dL (ref 8–23)
CALCIUM: 9.2 mg/dL (ref 8.9–10.3)
CHLORIDE: 106 mmol/L (ref 98–111)
CO2: 22 mmol/L (ref 22–32)
Creatinine, Ser: 1.44 mg/dL — ABNORMAL HIGH (ref 0.61–1.24)
GFR calc Af Amer: 54 mL/min — ABNORMAL LOW (ref >60)
GFR calc non Af Amer: 46 mL/min — ABNORMAL LOW (ref >60)
GLUCOSE: 83 mg/dL (ref 70–99)
POTASSIUM: 3.4 mmol/L — AB (ref 3.5–5.1)
Sodium: 136 mmol/L (ref 135–145)

## 2018-08-09 LAB — URINE CULTURE: Culture: >=100000 — AB

## 2018-08-09 MED ORDER — POTASSIUM CHLORIDE CRYS ER 20 MEQ PO TBCR
20.0000 meq | EXTENDED_RELEASE_TABLET | Freq: Once | ORAL | Status: AC
  Administered 2018-08-09: 20 meq via ORAL
  Filled 2018-08-09: qty 1

## 2018-08-09 NOTE — Progress Notes (Signed)
Referring Physician(s): Dr. Dareen Piano  Supervising Physician: Sandi Mariscal  Patient Status:  [facility] - In-pt  Chief Complaint: Follow-up retroperitoneal abscess drains x 2 placed 10/20 by Dr. Earleen Newport.  Subjective:  More lethargic and less talkative on exam today, states he did not sleep well and feels weak. Still has pain in his back which is chronic. He is frustrated that the other imaging test is not being done at this time. He states his appetite is still poor, had some cream of wheat today but gets nauseous with eating a lot of the time.   Allergies: Patient has no known allergies.  Medications: Prior to Admission medications   Medication Sig Start Date End Date Taking? Authorizing Provider  acetaminophen (TYLENOL) 325 MG tablet Take 650 mg by mouth 3 (three) times daily.   Yes [provider]  polyethylene glycol (MIRALAX) packet Take 17 g by mouth 2 (two) times daily. Patient not taking: Reported on 08/02/2018 09/26/17   Modena Jansky, MD  senna (SENOKOT) 8.6 MG TABS tablet Take 2 tablets (17.2 mg total) by mouth at bedtime as needed for mild constipation or moderate constipation. Patient not taking: Reported on 08/02/2018 09/26/17   Modena Jansky, MD  vitamin B-12 (CYANOCOBALAMIN) 1000 MCG tablet Take 2 tablets (2,000 mcg total) by mouth daily. Patient not taking: Reported on 08/02/2018 09/26/17   Modena Jansky, MD     Vital Signs: BP (!) 97/56 (BP Location: Right Arm)   Pulse 69   Temp 98.6 F (37 C) (Oral)   Resp 16   Ht 5\' 11"  (1.803 m)   Wt 235 lb 7.2 oz (106.8 kg)   SpO2 97%   BMI 32.84 kg/m   Physical Exam  Constitutional: No distress.  Cardiovascular: Normal rate, regular rhythm and normal heart sounds.  Pulmonary/Chest: Effort normal and breath sounds normal.  Abdominal: Soft. There is no tenderness.  JP with ~10 cc tan output; gravity bag with scant tan output. Both insertion sites clean, dry, intact.  Neurological: He is alert.    lethargic  Skin: Skin is warm and dry. He is not diaphoretic.  Nursing note and vitals reviewed.   Imaging: Mr Abdomen Wo Contrast  Result Date: 08/08/2018 CLINICAL DATA:  Retroperitoneal abscess, query fistula. EXAM: MRI ABDOMEN WITHOUT CONTRAST TECHNIQUE: Multiplanar multisequence MR imaging was performed without the administration of intravenous contrast. COMPARISON:  CT examinations of 08/04/2018 and 08/03/2018 FINDINGS: Lower chest: Elevated right hemidiaphragm. Right lower lobe airspace opacity, probably mostly atelectasis. Thoracic spondylosis. Trace bilateral pleural effusions. Hepatobiliary: A gallstone the gallbladder measuring 5.3 cm in length fills. There several other smaller gallstones. No biliary dilatation. No focal hepatic lesion on today's noncontrast assessment. Pancreas: Two T2 signal hyperintensities along the pancreatic tail measuring in the 4 mm diameter range are probably small dilated side ducts but technically nonspecific. No dilation of the dorsal pancreatic duct. Spleen: Triangular signal hypointensity posteriorly in the spleen on image 164/9 with associated calcification on CT, probably a postinflammatory finding or small remote splenic infarct. Nonspecific 9 by 7 mm T2 hyperintense lesion medially in the spleen on image 22/10, without high signal on high B value diffusion-weighted images. Adrenals/Urinary Tract: Absent left kidney. An abnormal fluid collection of the left posterior retroperitoneum also contains gas and is most compatible with an abscess. This extends into the pelvis and below the inferior margin of imaging on today's MRI of the abdomen. There is a pigtail catheter extending into this collection. There is mild right hydronephrosis. A  right ureteral stent is present with proximal coil in the collecting system. Filling defects in the right collecting system are compatible with nephrolithiasis and most notable in the lower pole. There are at least 20 lesions the  right kidney, most of which are cysts. Some of the lesions are complex, with high precontrast T1 signal and/or low T2 signal. Enhancement characteristics are not assessed today. The right proximal ureter does not appear dilated and in fact appears potentially narrowed around the stent. The adrenal glands appear normal. Stomach/Bowel: No dilated bowel identified. The descending colon skirts the margin of the retroperitoneal abscess although I am skeptical of a connection giving the difference in the appearance of contents of the colon and the abscess. Contrast enema would be more definitive in ensuring that there is no connection. No dilated bowel in the upper abdomen. Vascular/Lymphatic:  Aortoiliac atherosclerotic vascular disease. Other: In addition to the left retroperitoneal drain, there is a pigtail catheter within a cavity in the subcutaneous tissues of the left posterior flank. It is difficult to separate how much of this cavity is phlegmon from how much is actual abscess, and the cavity does appears smaller than on 08/04/2018 when the catheter was placed. However, the cavity may measure as much as 4.0 by 14.8 by 3.6 cm today, and there is an air-fluid level component anteriorly. With respect to a direct connection between the retroperitoneal cavity and the subcutaneous cavity, certainly the retroperitoneal drain extends through the flank cavity and into the retroperitoneum, and tracking along the margin of the drain would be a possibility. Otherwise I do not directly visualize a connection, although sensitivity for small connections would be low. Musculoskeletal: Thoracolumbar spondylosis IMPRESSION: 1. Large retroperitoneal fluid collection with air-fluid level persists on the left. There continues to be a pigtail drain in place. I do not see an obvious connection to the immediately adjacent descending colon, and the contents of the colon and the collection appear different, however contrast enema would be  more definitive in ensuring that there is no connection. This collection extends down into the pelvis and thus was not fully covered on today's MRI of the upper abdomen without contrast. 2. Other imaging findings of potential clinical significance: Elevated right hemidiaphragm. Right lower lobe airspace opacity favoring atelectasis. Trace bilateral pleural effusions. Large gallstone fills most of the gallbladder. 3. There is also a subcutaneous abscess along the left flank, with abscess and surrounding phlegmon measuring about 4.0 by 14.8 by 3.6 cm today. The drainage catheter extending into the left retroperitoneum penetrates through this collection. The left flank subcutaneous collection also has its own pigtail drainage catheter. Two miniscule fluid signal intensity lesions along the pancreatic tail are likely postinflammatory. Remote splenic infarct. Small T2 hyperintense lesion in the spleen is nonspecific but likely benign. Aortic Atherosclerosis (ICD10-I70.0). Electronically Signed   By: Van Clines M.D.   On: 08/08/2018 10:52   [place] Ekg Site Rite  Result Date: 08/06/2018 If Site Rite image not attached, placement could not be confirmed due to current cardiac rhythm.   Labs:  CBC: Recent Labs    08/06/18 0341 08/07/18 0420 08/08/18 0342 08/09/18 0444  WBC 15.3* 15.6* 13.3* 9.8  HGB 7.4* 7.5* 7.5* 7.6*  HCT 25.1* 26.0* 24.8* 25.8*  PLT 317 350 335 376    COAGS: Recent Labs    08/03/18 0603  INR 1.36  APTT 43*    BMP: Recent Labs    08/06/18 0341 08/07/18 0420 08/08/18 0342 08/09/18 0444  NA 136 136  135 136  K 4.0 3.4* 3.6 3.4*  CL 110 108 109 106  CO2 19* 19* 20* 22  GLUCOSE 96 81 89 83  BUN 22 21 22 19   CALCIUM 9.4 9.4 9.1 9.2  CREATININE 1.51* 1.44* 1.43* 1.44*  GFRNONAA 44* 46* 47* 46*  GFRAA 51* 54* 54* 54*    LIVER FUNCTION TESTS: Recent Labs    09/21/17 0743 08/02/18 1921  BILITOT 1.1 0.5  AST 39 20  ALT 19 15  ALKPHOS 70 71  PROT 7.4 7.1    ALBUMIN 2.7* 1.9*    Assessment and Plan:  S/p retroperitoneal abscess drains x 2 placed on 10/20 with Dr. Earleen Newport - cultures so far have grown proteus and klebsiella, he is currently on vancomycin, cefepime and flagyl per ID.    Output from left JP 7.5 cc in last 24 hours, left gravity bag 20 cc in last 24 hours. MR abdomen w/o contrast  performed performed 10/23 to assess for colonic fistula - both drains appear to be in appropriate position and no fistula was seen. Dr. Pascal Lux does not recommend repeat imaging unless there is concern for drain malfunction, worsening leukocytosis, fever and/or drain output has decreased to <10 cc per day. If any of these occur would recommend CT abd/pelvis with IV contrast and likely drain injection in IR prior to possible removal.  Continues to be afebrile, WBC normalzied at 9.8, still hypotensive, more weak/lethargic today.   Continue current management - TID flushes with 3-5 cc NS, record output from each drain daily. Discharge per admitting service - patient will follow up with IR in outpatient clinic after d/c for imaging/possible injection.   Electronically Signed: Joaquim Nam, PA-C 08/09/2018, 3:38 PM   I spent a total of 15 Minutes at the the patient's bedside AND on the patient's hospital floor or unit, greater than 50% of which was counseling/coordinating care for retroperitoneal drains x 2.

## 2018-08-09 NOTE — Progress Notes (Signed)
Internal Medicine Attending:   I saw and examined the patient. I reviewed the resident's note and I agree with the resident's findings and plan as documented in the resident's note.  Patient complains of persistent weakness.  He also states that his urine is darker than usual.  He still has a poor appetite.  No fevers or chills, no abdominal pain, no vomiting.  Patient was admitted to the hospital for a retroperitoneal abscess status post drainage by IR.  The etiology of his abscess remains uncertain at this time.  MRI done yesterday showed no obvious fistula between the colon and the abscess.  Resident discussed case with IR who did not recommend further imaging unless patient has worsening leukocytosis or fevers or the drain output decreases.  ID follow-up and recommendations appreciated.  We will continue with Unasyn to cover the Klebsiella and Proteus growing from the abscess cultures.  Urine culture growing multiple organisms but this is likely colonization and does not require coverage at this time.  Patient's creatinine is at baseline and his leukocytosis is now resolved.  We will continue to monitor him closely.

## 2018-08-09 NOTE — Progress Notes (Addendum)
   Subjective: Mr. Mccravy reported feeling weak today with no improvement. He noticed his catheter was producing darker than usual urine for the past day. He said he was having some nausea that starts after he eats. His appetite is still poor. He denied any fevers, abdominal pain or vomiting.  Objective:  Vital signs in last 24 hours: Vitals:   08/08/18 2102 08/09/18 0000 08/09/18 0437 08/09/18 0500  BP: (!) 86/46 (!) 89/49 (!) 86/46   Pulse: 73  74   Resp: 18  18   Temp: 98.1 F (36.7 C) 98.2 F (36.8 C) 98.3 F (36.8 C)   TempSrc: Oral Oral Oral   SpO2: 96% 95% 95%   Weight:    106.8 kg  Height:       Physical Exam  Constitutional: He is oriented to person, place, and time and well-developed, well-nourished, and in no distress.  Cardiovascular: Normal rate, regular rhythm and normal heart sounds.  No murmur heard. Pulmonary/Chest: Breath sounds normal. No respiratory distress. He has no wheezes.  Abdominal: Soft. Bowel sounds are normal. He exhibits no distension. There is no tenderness.  Musculoskeletal: He exhibits no edema.  Neurological: He is alert and oriented to person, place, and time.  Skin: Skin is warm and dry.  Suprapubic catheter site is clean and dry, no signs of infection    Assessment/Plan:  Active Problems:   Psoas abscess, left (HCC)  Sepsis secondary to psoas abscess - WBC 9.8, afebrile - drains still in place, output ~28 cc in the past 24 hours - continue Unasyn per ID, appreciate recommendations  - MRI showed large retroperitoneal fluid collection persisting on left; no obvious connection to immediately adjacent descending colon, and the contents of the colon and the collection appeared different; contrast enema would be more definitive in ensuring no connection - discussed case with IR and they do not recommend further imaging at this time; reasons to repeat would be decline in patient's status or no more drainage. Could consider imaging under  fluoroscopy with water enema to confirm possible fistula   - will consider PICC line placement depending on course of abx treatment; patient is denying PICC line at this time - IR managing drain care  Acute on chronic CKD UTI - Cr 1.4, stable;baseline appears to be 1.3 - suprapubic catheter in place without signs of infection - polymicrobial urine culture, most likely colonization; will not treat unless new symptoms arise, per ID   Normocytic anemia Hgb 7.4, stable; will monitor -cbc in am -repeat stat H&H if changes in vitals  Hx of HFpEF Used to be on Lasix PO 20mg  daily at home - Monitor for signs of hypervolemia  Dispo: Anticipated discharge in approximately 2-3 days. Unclear source of retroperitoneal abscess at this point with continued drainage.  Mike Craze, DO 08/09/2018, 7:25 AM Pager: [phone]

## 2018-08-10 LAB — GLUCOSE, CAPILLARY
Glucose-Capillary: 68 mg/dL — ABNORMAL LOW (ref 70–99)
Glucose-Capillary: 92 mg/dL (ref 70–99)

## 2018-08-10 NOTE — Progress Notes (Addendum)
   Subjective: Mr. Strupp reported that he is still feeling weak with no improvement.  He stated he did not sleep well overnight.  He continues to have decreased appetite with nausea.  He expressed concerns that his urine is continuing to remain dark.  He reported some abdominal pain; denies vomiting.  Objective:  Vital signs in last 24 hours: Vitals:   08/09/18 1342 08/09/18 2117 08/10/18 0639 08/10/18 0700  BP: (!) 97/56 (!) 96/57 (!) 102/38   Pulse: 69 77 77   Resp: 16 16 18    Temp: 98.6 F (37 C) 97.7 F (36.5 C) 97.7 F (36.5 C)   TempSrc: Oral Oral Oral   SpO2: 97% 98% 96%   Weight:    102.9 kg  Height:       Physical Exam  Constitutional:  Ill-appearing, weak  Cardiovascular: Normal rate, regular rhythm and normal heart sounds.  No murmur heard. Pulmonary/Chest: Effort normal and breath sounds normal. No respiratory distress. He has no wheezes.  Abdominal: Soft. Bowel sounds are normal. He exhibits no distension. There is tenderness.  Mild diffuse tenderness  Musculoskeletal: He exhibits no edema.  Skin: Skin is warm and dry.  Suprapubic catheter site is clean and dry    Assessment/Plan:  Active Problems:   Psoas abscess, left (HCC)   Malnutrition of moderate degree  Sepsis secondary to psoas abscess -afebrile -drains still in place, output ~25 ccin the past 24 hours - continue Unasyn per ID, appreciate recommendations  - MRI did not show any obvious fistula; discussed case with IR and they do not recommend further imaging at this time; reasons to repeat would be decline in patient's status or no more drainage. Could consider imaging under fluoroscopy with water enema to confirm possible fistula   - will consider PICC line placement depending on course of abx treatment; patient is denying PICC line at this time - IR managing drain care  Acute on chronic CKD UTI - Cr 1.4, stable;baseline appears to be 1.3 - suprapubic catheter in place without signs of  infection; foley bag contains dark urine   Normocytic anemia Hgb 7.4, stable -repeat stat H&H if changes in vitals  Hx of HFpEF - Monitor for signs of hypervolemia  Dispo: Anticipated discharge pending clinical improvement and decreased abscess drainage.   Mike Craze, DO 08/10/2018, 8:42 AM Pager: [phone]

## 2018-08-11 DIAGNOSIS — E43 Unspecified severe protein-calorie malnutrition: Secondary | ICD-10-CM

## 2018-08-11 DIAGNOSIS — N200 Calculus of kidney: Secondary | ICD-10-CM

## 2018-08-11 DIAGNOSIS — R319 Hematuria, unspecified: Secondary | ICD-10-CM

## 2018-08-11 DIAGNOSIS — K6819 Other retroperitoneal abscess: Secondary | ICD-10-CM | POA: Diagnosis present

## 2018-08-11 LAB — COMPREHENSIVE METABOLIC PANEL
ALT: 8 U/L (ref 0–44)
AST: 13 U/L — AB (ref 15–41)
Albumin: 1.7 g/dL — ABNORMAL LOW (ref 3.5–5.0)
Alkaline Phosphatase: 56 U/L (ref 38–126)
Anion gap: 8 (ref 5–15)
BUN: 15 mg/dL (ref 8–23)
CHLORIDE: 105 mmol/L (ref 98–111)
CO2: 22 mmol/L (ref 22–32)
Calcium: 8.7 mg/dL — ABNORMAL LOW (ref 8.9–10.3)
Creatinine, Ser: 1.36 mg/dL — ABNORMAL HIGH (ref 0.61–1.24)
GFR, EST AFRICAN AMERICAN: 58 mL/min — AB (ref >60)
GFR, EST NON AFRICAN AMERICAN: 50 mL/min — AB (ref >60)
Glucose, Bld: 127 mg/dL — ABNORMAL HIGH (ref 70–99)
POTASSIUM: 3.3 mmol/L — AB (ref 3.5–5.1)
SODIUM: 135 mmol/L (ref 135–145)
Total Bilirubin: 0.3 mg/dL (ref 0.3–1.2)
Total Protein: 5.9 g/dL — ABNORMAL LOW (ref 6.5–8.1)

## 2018-08-11 LAB — GLUCOSE, CAPILLARY
GLUCOSE-CAPILLARY: 63 mg/dL — AB (ref 70–99)
GLUCOSE-CAPILLARY: 80 mg/dL (ref 70–99)

## 2018-08-11 NOTE — Progress Notes (Addendum)
[facility] for Infectious Disease    Date of Admission:  08/02/2018   Total days of antibiotics 10        ID: Jerry [name] is a 72 y.o. male with retroperitoneal, psoas muscle abscess Active Problems:   Psoas abscess, left (HCC)   Malnutrition of moderate degree    Subjective: Afebrile, feels fatigued, poor sleep. He is concerned about his urinary catheter, dark urine  We discussed the need for IV access and he is agreeable to getting central access (not PICC) but catheter at chest wall Medications:  . acetaminophen  500 mg Oral Q4H   Or  . acetaminophen  650 mg Rectal Q4H  . enoxaparin (LOVENOX) injection  40 mg Subcutaneous Q24H  . feeding supplement  1 Container Oral TID BM  . sodium chloride flush  5 mL Intracatheter Q8H    Objective: Vital signs in last 24 hours: Temp:  [98.1 F (36.7 C)-98.4 F (36.9 C)] 98.1 F (36.7 C) (10/27 0534) Pulse Rate:  [73-76] 76 (10/27 0534) Resp:  [16-18] 18 (10/27 0534) BP: (95-108)/(54-59) 108/59 (10/27 0534) SpO2:  [96 %-97 %] 97 % (10/27 0534) Weight:  [100.8 kg] 100.8 kg (10/27 0500) Physical Exam  Constitutional: He is oriented to person, place, and time. He appears well-developed and well-nourished. No distress.  HENT:  Mouth/Throat: Oropharynx is clear and moist. No oropharyngeal exudate.  Cardiovascular: Normal rate, regular rhythm and normal heart sounds. Exam reveals no gallop and no friction rub.  No murmur heard.  Pulmonary/Chest: Effort normal and breath sounds normal. No respiratory distress. He has no wheezes.  Abdominal: Soft. Bowel sounds are normal. He exhibits no distension. There is no tenderness. Urinary catheter  Back = left sided drain with brown opaque purulent fluid in bulb,bag Neurological: He is alert and oriented to person, place, and time.  Skin: Skin is warm and dry. No rash noted. No erythema.  Psychiatric: He has a normal mood and affect. His behavior is normal.     Lab Results Recent  Labs    08/09/18 0444  WBC 9.8  HGB 7.6*  HCT 25.8*  NA 136  K 3.4*  CL 106  CO2 22  BUN 19  CREATININE 1.44*    Microbiology:abscess: MODERATE PROTEUS MIRABILIS  MODERATE KLEBSIELLA PNEUMONIAE  MODERATE VIRIDANS STREPTOCOCCUS  FEW PREVOTELLA DENTICOLA  FEW BACTEROIDES SPECIES  BETA LACTAMASE POSITIVE   Abscess #2 cx: MODERATE PROTEUS MIRABILIS  MODERATE KLEBSIELLA PNEUMONIAE  MODERATE VIRIDANS STREPTOCOCCUS  FEW BACTEROIDES VULGATUS BETA LACTAMASE POSITIVE  FEW PREVOTELLA DENTICOLA  Studies/Results: MRI: Adrenals/Urinary Tract: Absent left kidney. An abnormal fluid collection of the left posterior retroperitoneum also contains gas and is most compatible with an abscess. This extends into the pelvis and below the inferior margin of imaging on today's MRI of the abdomen. There is a pigtail catheter extending into this collection.  There is mild right hydronephrosis. A right ureteral stent is present with proximal coil in the collecting system. Filling defects in the right collecting system are compatible with nephrolithiasis and most notable in the lower pole. There are at least 20 lesions the right kidney, most of which are cysts. Some of the lesions are complex, with high precontrast T1 signal and/or low T2 signal. Enhancement characteristics are not assessed today.  IMPRESSION: 1. Large retroperitoneal fluid collection with air-fluid level persists on the left. There continues to be a pigtail drain in place. I do not see an obvious connection to the immediately adjacent descending colon, and the  contents of the colon and the collection appear different, however contrast enema would be more definitive in ensuring that there is no connection. This collection extends down into the pelvis and thus was not fully covered on today's MRI of the upper abdomen without contrast. 2. Other imaging findings of potential clinical significance: Elevated right hemidiaphragm.  Right lower lobe airspace opacity favoring atelectasis. Trace bilateral pleural effusions. Large gallstone fills most of the gallbladder. 3. There is also a subcutaneous abscess along the left flank, with abscess and surrounding phlegmon measuring about 4.0 by 14.8 by 3.6 cm today. The drainage catheter extending into the left retroperitoneum penetrates through this collection. The left flank subcutaneous collection also has its own pigtail drainage catheter.  Assessment/Plan: Polymicrobial retroperitoneal/psoas muscle abscess with large surrounding phlegmon = appears consistent with GI flora but unable to determine or at least not seen in imaging if there is fistula. Will continue to have drain managed for output. Please refer to dr. Deniece Portela request for follow up imaging and follow up in drain clinic.  In terms of abtx - while he is hospitalized, can continue with amp/sub. For discharge to snf would change to piptazo to take through December 1st -- a total of 6 wk for treatment, then convert to amox/clav 875mg  bid for addn 1-2 months given the size of the abscess fluid collection.  Please discuss with IR to place a central line (those they use for pts with CKD 4)- [place] or IJ.(pt does not want picc line)  Will see him back in the ID clinic in 4 wk  In terms of hematuria = unclear if this is related to his hx of kidney stones. He reminded me that he is due for change of his catheter which I said we would try to change out for him.  Deconditioning and severe malnutrition = continue to advocate for patient to improve protein nutritional intake and working with PT  Will sign off.  [facility] for Infectious Diseases Cell: [phone] Pager: [phone]  08/11/2018, 12:14 PM

## 2018-08-11 NOTE — Progress Notes (Signed)
  Date: 08/11/2018  Patient name: Jerry [name]  Medical record number: [id number]  Date of birth: 1946/03/28   I have seen and evaluated this patient and I have discussed the plan of care with the house staff. Please see their note for complete details. I concur with their findings.  Bartholomew Crews, MD 08/11/2018, 1:34 PM

## 2018-08-11 NOTE — Progress Notes (Signed)
   Subjective: Jerry [name] reported feeling about the same this morning.  His biggest concern was the continuous dark urine output.  He denied any fevers, abdominal pain, nausea or vomiting.  Objective:  Vital signs in last 24 hours: Vitals:   08/10/18 1510 08/10/18 2151 08/11/18 0500 08/11/18 0534  BP: (!) 95/55 (!) 102/54  (!) 108/59  Pulse: 73 76  76  Resp: 18 16  18   Temp: 98.2 F (36.8 C) 98.4 F (36.9 C)  98.1 F (36.7 C)  TempSrc: Oral Oral  Oral  SpO2: 96% 97%  97%  Weight:   100.8 kg   Height:       Physical Exam  Constitutional: He is oriented to person, place, and time and well-developed, well-nourished, and in no distress.  Cardiovascular: Normal rate, regular rhythm and normal heart sounds.  No murmur heard. Pulmonary/Chest: Effort normal and breath sounds normal. No respiratory distress. He has no wheezes.  Abdominal: Soft. Bowel sounds are normal. He exhibits no distension. There is no tenderness.  Suprapubic catheter site clean and dry  Musculoskeletal: He exhibits no edema.  Neurological: He is alert and oriented to person, place, and time.  Skin: Skin is warm and dry.    Assessment/Plan:  Active Problems:   Psoas abscess, left (HCC)   Malnutrition of moderate degree  Sepsis secondary to psoas abscess -afebrile -drains still in place, output ~30ccin the past 24 hours - continue Unasyn day 5 per ID, can switch to pip/tazo at discharge for a total of 6 weeks of treatment then convert to amox/clav 875 mg bid for additional 1-2 months; appreciate ID recommendations  - repeat imaging if decline in patient's status or no more drainage. Could consider imaging under fluoroscopy with water enema to further evaluate for possible fistula - IR managing drain care - Pt is agreeable to central access at chest wall but not PICC line, per ID   Acute on chronic CKD UTI - Foley bag continues to be filled with dark cola colored urine; unclear etiology  -  suprapubic catheter in place without signs of infection; he normally gets this changed on the sixth of every month - f/u cmp  Normocytic anemia Hgb 7.4, stable -repeat stat H&H if changes in vitals  Hx of HFpEF - Monitor for signs of hypervolemia  Dispo: Anticipated discharge pending drain output. Once this has decreased we will discuss plans to remove drains with IR and central access placement for continued IV therapy.   Mike Craze, DO 08/11/2018, 12:25 PM Pager: [phone]

## 2018-08-12 LAB — CBC
HCT: 27.8 % — ABNORMAL LOW (ref 39.0–52.0)
HEMOGLOBIN: 8 g/dL — AB (ref 13.0–17.0)
MCH: 26 pg (ref 26.0–34.0)
MCHC: 28.8 g/dL — ABNORMAL LOW (ref 30.0–36.0)
MCV: 90.3 fL (ref 80.0–100.0)
NRBC: 0 % (ref 0.0–0.2)
Platelets: 433 10*3/uL — ABNORMAL HIGH (ref 150–400)
RBC: 3.08 MIL/uL — AB (ref 4.22–5.81)
RDW: 15.6 % — ABNORMAL HIGH (ref 11.5–15.5)
WBC: 11 10*3/uL — AB (ref 4.0–10.5)

## 2018-08-12 LAB — URINALYSIS, ROUTINE W REFLEX MICROSCOPIC
BILIRUBIN URINE: NEGATIVE
Glucose, UA: NEGATIVE mg/dL
Ketones, ur: NEGATIVE mg/dL
NITRITE: NEGATIVE
PROTEIN: 100 mg/dL — AB
Specific Gravity, Urine: 1.014 (ref 1.005–1.030)
pH: 6 (ref 5.0–8.0)

## 2018-08-12 LAB — BASIC METABOLIC PANEL
Anion gap: 5 (ref 5–15)
BUN: 12 mg/dL (ref 8–23)
CO2: 25 mmol/L (ref 22–32)
CREATININE: 1.22 mg/dL (ref 0.61–1.24)
Calcium: 9 mg/dL (ref 8.9–10.3)
Chloride: 106 mmol/L (ref 98–111)
GFR calc Af Amer: >60 mL/min (ref >60)
GFR calc non Af Amer: 57 mL/min — ABNORMAL LOW (ref >60)
GLUCOSE: 94 mg/dL (ref 70–99)
Potassium: 3 mmol/L — ABNORMAL LOW (ref 3.5–5.1)
SODIUM: 136 mmol/L (ref 135–145)

## 2018-08-12 LAB — GLUCOSE, CAPILLARY: GLUCOSE-CAPILLARY: 80 mg/dL (ref 70–99)

## 2018-08-12 MED ORDER — POTASSIUM CHLORIDE CRYS ER 20 MEQ PO TBCR
40.0000 meq | EXTENDED_RELEASE_TABLET | Freq: Once | ORAL | Status: AC
  Administered 2018-08-12: 40 meq via ORAL
  Filled 2018-08-12: qty 2

## 2018-08-12 NOTE — Progress Notes (Signed)
Occupational Therapy Treatment Patient Details Name: Jerry [name] MRN: [id number] DOB: 30-Nov-1945 Today's Date: 08/12/2018    History of present illness Jerry [name] is a 72yo male who comes to [facility] on 10/18 p 1W decreased appetite, lethargy, found to be septic from a psoas abscess. PTA pt has been at [facility] then SNF, reports unable to AMB past 9-43months, but was able to self propel in WC. Pt reports needing hoyer lift transfers from bed to [place].    OT comments  Pt initially refusing OT intervention but agreeable to repositioning for comfort. +2 total assistance needed for safety with bed mobility. Pt repositioned for comfort and in side lying position facing L. Family member present in room.   Follow Up Recommendations  SNF;Supervision/Assistance - 24 hour    Equipment Recommendations  Other (comment)(defer to next venue of care)    Recommendations for Other Services      Precautions / Restrictions Precautions Precautions: Fall Precaution Comments: Left flank drains, suprapubic catheter.  Restrictions Weight Bearing Restrictions: No       Mobility Bed Mobility Overal bed mobility: Needs Assistance Bed Mobility: Rolling Rolling: +2 for physical assistance         General bed mobility comments: partially rolling to allow for bed repositioning and placement of pillows   Transfers Overall transfer level: (refused)               General transfer comment: refused        ADL either performed or assessed with clinical judgement        Vision Baseline Vision/History: No visual deficits            Cognition Arousal/Alertness: Awake/alert Behavior During Therapy: WFL for tasks assessed/performed Overall Cognitive Status: Within Functional Limits for tasks assessed         General Comments: pt irritable throughout session, requiring max encouragement/education to mobilize                   Pertinent Vitals/ Pain       Pain Assessment: 0-10 Pain  Score: 5  Pain Location: back Pain Descriptors / Indicators: Grimacing;Discomfort Pain Intervention(s): Limited activity within patient's tolerance;Monitored during session;Repositioned     Prior Functioning/Environment              Frequency  Min 2X/week        Progress Toward Goals  OT Goals(current goals can now be found in the care plan section)  Progress towards OT goals: Progressing toward goals  Acute Rehab OT Goals Patient Stated Goal: none stated OT Goal Formulation: With patient Time For Goal Achievement: 08/26/18 Potential to Achieve Goals: [place] Discharge plan remains appropriate    Co-evaluation    PT/OT/SLP Co-Evaluation/Treatment: Yes Reason for Co-Treatment: For patient/therapist safety;To address functional/ADL transfers PT goals addressed during session: Mobility/safety with mobility OT goals addressed during session: ADL's and self-care;Other (comment)(bed mobility)      AM-PAC PT "6 Clicks" Daily Activity     Outcome Measure   Help from another person eating meals?: A Little Help from another person taking care of personal grooming?: A Little Help from another person toileting, which includes using toliet, bedpan, or urinal?: Total Help from another person bathing (including washing, rinsing, drying)?: Total Help from another person to put on and taking off regular upper body clothing?: Total Help from another person to put on and taking off regular lower body clothing?: Total 6 Click Score: 10    End of Session  OT Visit Diagnosis: Muscle weakness (generalized) (M62.81)   Activity Tolerance Patient tolerated treatment well;Patient limited by fatigue   Patient Left in bed;with call bell/phone within reach;with bed alarm set;with family/visitor present   Nurse Communication Other (comment)(catheter concerns)        Time: 4481-8563 OT Time Calculation (min): 24 min  Charges: OT General Charges $OT Visit: 1 Visit OT  Treatments $Self Care/Home Management : 8-22 mins    Gypsy Decant 08/12/2018, 1:44 PM

## 2018-08-12 NOTE — Progress Notes (Signed)
Pt urinary drainage bag noted not to be draining. MD notified and ordered to change drainage bag. Bag changed with 287ml urine emptied. Will continue to closely monitor. Delia Heady RN

## 2018-08-12 NOTE — Progress Notes (Addendum)
   Subjective: Jerry [name] reported feeling the same today. He expressed frustration about his situation. He was still concerned about his dark urine. Denied any nausea, vomiting. Endorsed abdominal pain in his LUQ.   Objective:  Vital signs in last 24 hours: Vitals:   08/11/18 1347 08/11/18 2159 08/12/18 0500 08/12/18 0651  BP: (!) 91/51 (!) 91/44  (!) 100/55  Pulse: 64 66  68  Resp: 20 20  17   Temp: 98.2 F (36.8 C) 97.6 F (36.4 C)  97.8 F (36.6 C)  TempSrc: Oral Oral  Oral  SpO2: 94% 96%  96%  Weight:   101.2 kg   Height:       Physical Exam  Constitutional: He is oriented to person, place, and time and well-developed, well-nourished, and in no distress.  Cardiovascular: Normal rate, regular rhythm and normal heart sounds.  No murmur heard. Pulmonary/Chest: Effort normal and breath sounds normal. No respiratory distress. He has no wheezes.  Abdominal: Soft. Bowel sounds are normal. He exhibits no distension. There is no tenderness.  Catheter still has dark colored urine  Musculoskeletal: He exhibits no edema.  Neurological: He is alert and oriented to person, place, and time.  Skin: Skin is warm and dry.  Drain output is yellow-brown in color    Assessment/Plan:  Active Problems:   Psoas abscess, left (HCC)   Malnutrition of moderate degree   Retroperitoneal abscess (HCC)   Nephrolithiasis  Sepsis secondary to psoas abscess -afebrile -drains still in place, output ~40ccin the past 24 hours - continue Unasyn day 6 per ID, can switch to pip/tazo at discharge for a total of 6 weeks of treatment then convert to amox/clav 875 mg bid for additional 1-2 months; appreciate ID recommendations  -repeat imaging if decline in patient's status or no more drainage. Could consider imaging under fluoroscopy with water enema to further evaluate for possible fistula - IR managing drain care - Pt is agreeable to central access at chest wall but not PICC line, per ID - pt  refusing to eat today and has refused other interventions; consult palliative care   Acute on chronic CKD UTI - Foley bag continues to be filled with dark cola colored urine; unclear etiology  - consult urolog - f/u repeat UA  - f/u cbc - suprapubic catheter in place without signs of infection; he normally gets this changed on the sixth of every month - LFT's wnl, T bili 0.3  Normocytic anemia Hgb 7.4, stable -repeat stat H&H if changes in vitals  Hx of HFpEF -Monitor for signs of hypervolemia  Dispo: Anticipated discharge pending drain output. Once this has decreased we will discuss plans to remove drains with IR and central access placement for continued IV therapy.   Jerry Craze, DO 08/12/2018, 7:02 AM Pager: [phone]

## 2018-08-12 NOTE — Progress Notes (Signed)
Internal Medicine Attending:   I saw and examined the patient. I reviewed the resident's note and I agree with the resident's findings and plan as documented in the resident's note.  Patient states that he is concerned about his dark urine and also complains of some mild pain at the site of his abdominal drains.  Patient was initially admitted to the hospital with sepsis secondary to a retroperitoneal and psoas abscess.  Patient continues to drain fluid from these sites (approximately 40 cc in the last 24 hours).  ID follow-up and recommendations appreciated.  Continue with Unasyn for now and can switch to Zosyn to complete a 6-week course of treatment on discharge.  After this patient will need an additional 1 to 2 months of oral amoxicillin clavulanate.  We will need to repeat imaging if patient's status declines or if the drains have decreased output.  No further work-up at this point.  Patient also noted to have hematuria.  The etiology of his hematuria remains uncertain at this time.  He does have a suprapubic catheter in place.  Will discuss with urology as to whether he needs this catheter changed out.  His repeat UA showed large hemoglobin as well as greater than 50 RBCs and his CBC is stable.  We will follow-up urology consult.

## 2018-08-12 NOTE — Progress Notes (Signed)
Palliative Medicine consult noted. Due to high referral volume, there may be a delay seeing this patient. Please call the Palliative Medicine Team office at [phone] if recommendations are needed in the interim.  Thank you for inviting [place] to see this patient.  Marjie Skiff Allecia Bells, RN, BSN, [facility] Palliative Medicine Team 08/12/2018 3:26 PM Office [phone]

## 2018-08-12 NOTE — Plan of Care (Signed)
  Problem: Acute Rehab PT Goals(only PT should resolve) Goal: PT Additional Goal #1 Outcome: Not Progressing Note:  Goal extended to 08/26/18 Goal: PT Additional Goal #2 Outcome: Not Progressing Note:  Goal extended to 08/26/18

## 2018-08-12 NOTE — Progress Notes (Signed)
Physical Therapy Treatment Patient Details Name: Jerry [name] MRN: [id number] DOB: 1946/07/22 Today's Date: 08/12/2018    History of Present Illness Jerry [name] is a 72yo male who comes to [facility] on 10/18 p 1W decreased appetite, lethargy, found to be septic from a psoas abscess. PTA pt has been at [facility] then SNF, reports unable to AMB past 9-64months, but was able to self propel in WC. Pt reports needing hoyer lift transfers from bed to [place].     PT Comments    Pt initially refusing any mobility; however with max encouragement and education regarding importance of repositioning every 2 hours if remaining in the bed.  Requires +2 assist to come to hook lying and rotate partially onto L side.  Pillows positioned to maintain pt in semi-side lying.  Continue to recommend SNF at d/c and pt may benefit from palliative care consult to discuss goals of care if he continues to decline participation in mobility.      Follow Up Recommendations  SNF;Supervision/Assistance - 24 hour;Home health PT     Equipment Recommendations  None recommended by PT    Recommendations for Other Services       Precautions / Restrictions Precautions Precautions: Fall Precaution Comments: Left flank drains, suprapubic catheter.  Restrictions Weight Bearing Restrictions: No    Mobility  Bed Mobility Overal bed mobility: Needs Assistance Bed Mobility: Rolling Rolling: +2 for physical assistance         General bed mobility comments: partially rolling to allow for bed repositioning and placement of pillows to prevent excessive R LE external rotation  Transfers Overall transfer level: (refused)                  Ambulation/Gait                 Stairs             Wheelchair Mobility    Modified Rankin (Stroke Patients Only)       Balance                                            Cognition Arousal/Alertness: Awake/alert Behavior During Therapy: WFL for  tasks assessed/performed Overall Cognitive Status: Within Functional Limits for tasks assessed                                 General Comments: pt irritable throughout session, requiring max encouragement/education to mobilize      Exercises      General Comments        Pertinent Vitals/Pain Pain Assessment: 0-10 Pain Score: 5  Pain Location: back Pain Descriptors / Indicators: Grimacing;Discomfort Pain Intervention(s): Limited activity within patient's tolerance;Monitored during session;Repositioned    Home Living                      Prior Function            PT Goals (current goals can now be found in the care plan section) Acute Rehab PT Goals PT Goal Formulation: With patient Time For Goal Achievement: 08/26/18(updated goals on 08/12/18) Potential to Achieve Goals: Fair Progress towards PT goals: Not progressing toward goals - comment(pt with limited participation in therapy sessions)    Frequency    Min 2X/week      PT Plan Current  plan remains appropriate    Co-evaluation PT/OT/SLP Co-Evaluation/Treatment: Yes Reason for Co-Treatment: For patient/therapist safety;To address functional/ADL transfers PT goals addressed during session: Mobility/safety with mobility        AM-PAC PT "6 Clicks" Daily Activity  Outcome Measure  Difficulty turning over in bed (including adjusting bedclothes, sheets and blankets)?: Unable Difficulty moving from lying on back to sitting on the side of the bed? : Unable Difficulty sitting down on and standing up from a chair with arms (e.g., wheelchair, bedside commode, etc,.)?: Unable Help needed moving to and from a bed to chair (including a wheelchair)?: Total Help needed walking in hospital room?: Total Help needed climbing 3-5 steps with a railing? : Total 6 Click Score: 6    End of Session   Activity Tolerance: Patient limited by pain Patient left: in bed;with call bell/phone within  reach;with nursing/sitter in room;with family/visitor present Nurse Communication: Mobility status PT Visit Diagnosis: Muscle weakness (generalized) (M62.81);Difficulty in walking, not elsewhere classified (R26.2)     Time: 7510-2585 PT Time Calculation (min) (ACUTE ONLY): 23 min  Charges:  $Therapeutic Activity: 8-22 mins                   Michel Santee 08/12/2018, 1:30 PM

## 2018-08-13 MED ORDER — POTASSIUM CHLORIDE CRYS ER 20 MEQ PO TBCR: 40.0000 meq | EXTENDED_RELEASE_TABLET | Freq: Two times a day (BID) | ORAL | Status: DC

## 2018-08-13 MED ORDER — POTASSIUM CHLORIDE CRYS ER 20 MEQ PO TBCR
40.0000 meq | EXTENDED_RELEASE_TABLET | Freq: Two times a day (BID) | ORAL | Status: AC
  Administered 2018-08-13 (×2): 40 meq via ORAL
  Filled 2018-08-13 (×2): qty 2

## 2018-08-13 NOTE — Progress Notes (Signed)
Subjective: Patient reports dark urine for the past several days. No suprapubic or flank pain. UAs have show numerous RBCs. Urine was dark orange this morning  Objective: Vital signs in last 24 hours: Temp:  [98 F (36.7 C)-98.1 F (36.7 C)] 98 F (36.7 C) (10/29 0535) Pulse Rate:  [67-69] 69 (10/29 0535) Resp:  [16-17] 16 (10/29 0535) BP: (101-105)/(50-55) 101/55 (10/29 0535) SpO2:  [96 %] 96 % (10/29 0535)  Intake/Output from previous day: 10/28 0701 - 10/29 0700 In: 100 [IV Piggyback:100] Out: 2330 [Urine:2300; Drains:30] Intake/Output this shift: Total I/O In: 115 [Other:15; IV Piggyback:100] Out: 875 [Urine:875]  Physical Exam:  General:alert, cooperative and appears stated age GI: soft, non tender, normal bowel sounds, no palpable masses, no organomegaly, no inguinal hernia Male genitalia: not done Extremities: extremities normal, atraumatic, no cyanosis or edema  Lab Results: Recent Labs    08/12/18 1004  HGB 8.0*  HCT 27.8*   BMET Recent Labs    08/11/18 1246 08/12/18 1004  NA 135 136  K 3.3* 3.0*  CL 105 106  CO2 22 25  GLUCOSE 127* 94  BUN 15 12  CREATININE 1.36* 1.22  CALCIUM 8.7* 9.0   No results for input(s): LABPT, INR in the last 72 hours. No results for input(s): LABURIN in the last 72 hours. Results for orders placed or performed during the hospital encounter of 08/02/18  Culture, blood (routine x 2)     Status: None   Collection Time: 08/02/18  7:09 PM  Result Value Ref Range Status   Specimen Description BLOOD LEFT ANTECUBITAL  Final   Special Requests   Final    BOTTLES DRAWN AEROBIC AND ANAEROBIC Blood Culture results may not be optimal due to an excessive volume of blood received in culture bottles   Culture   Final    NO GROWTH 5 DAYS Performed at [facility] [address] [place], [place] [address]    Report Status 08/07/2018 FINAL  Final  Culture, blood (routine x 2)     Status: None   Collection Time: 08/02/18   7:22 PM  Result Value Ref Range Status   Specimen Description BLOOD LEFT ANTECUBITAL  Final   Special Requests   Final    BOTTLES DRAWN AEROBIC AND ANAEROBIC Blood Culture adequate volume   Culture   Final    NO GROWTH 5 DAYS Performed at [facility], [place] [address], [place], [place] [address]    Report Status 08/07/2018 FINAL  Final  Urine culture     Status: Abnormal   Collection Time: 08/02/18  8:20 PM  Result Value Ref Range Status   Specimen Description URINE, RANDOM  Final   Special Requests   Final    NONE Performed at [facility], [place] [address], [place], [place] [address]    Culture (A)  Final    >=100,000 COLONIES/mL PSEUDOMONAS AERUGINOSA >=100,000 COLONIES/mL STAPHYLOCOCCUS AUREUS >=100,000 COLONIES/mL VANCOMYCIN RESISTANT ENTEROCOCCUS ISOLATED    Report Status 08/09/2018 FINAL  Final   Organism ID, Bacteria PSEUDOMONAS AERUGINOSA (A)  Final   Organism ID, Bacteria STAPHYLOCOCCUS AUREUS (A)  Final   Organism ID, Bacteria VANCOMYCIN RESISTANT ENTEROCOCCUS ISOLATED (A)  Final      Susceptibility   Pseudomonas aeruginosa - MIC*    CEFTAZIDIME <=1 SENSITIVE Sensitive     CIPROFLOXACIN 1 SENSITIVE Sensitive     GENTAMICIN <=1 SENSITIVE Sensitive     IMIPENEM >=16 RESISTANT Resistant     PIP/TAZO <=4  SENSITIVE Sensitive     CEFEPIME <=1 SENSITIVE Sensitive     * >=100,000 COLONIES/mL PSEUDOMONAS AERUGINOSA   Staphylococcus aureus - MIC*    CIPROFLOXACIN <=0.5 SENSITIVE Sensitive     GENTAMICIN <=0.5 SENSITIVE Sensitive     NITROFURANTOIN <=16 SENSITIVE Sensitive     OXACILLIN <=0.25 SENSITIVE Sensitive     TETRACYCLINE <=1 SENSITIVE Sensitive     VANCOMYCIN <=0.5 SENSITIVE Sensitive     TRIMETH/SULFA <=10 SENSITIVE Sensitive     CLINDAMYCIN <=0.25 SENSITIVE Sensitive     RIFAMPIN <=0.5 SENSITIVE Sensitive     Inducible Clindamycin NEGATIVE Sensitive     * >=100,000 COLONIES/mL STAPHYLOCOCCUS AUREUS   Vancomycin resistant enterococcus isolated - MIC*     AMPICILLIN <=2 SENSITIVE Sensitive     LEVOFLOXACIN >=8 RESISTANT Resistant     NITROFURANTOIN <=16 SENSITIVE Sensitive     VANCOMYCIN >=32 RESISTANT Resistant     LINEZOLID 2 SENSITIVE Sensitive     * >=100,000 COLONIES/mL VANCOMYCIN RESISTANT ENTEROCOCCUS ISOLATED  MRSA PCR Screening     Status: None   Collection Time: 08/03/18  1:33 AM  Result Value Ref Range Status   MRSA by PCR NEGATIVE NEGATIVE Final    Comment:        The GeneXpert MRSA Assay (FDA approved for NASAL specimens only), is one component of a comprehensive MRSA colonization surveillance program. It is not intended to diagnose MRSA infection nor to guide or monitor treatment for MRSA infections. Performed at [facility], [place] [address] [place], [place] [address]   Aerobic/Anaerobic Culture (surgical/deep wound)     Status: None   Collection Time: 08/04/18 12:22 PM  Result Value Ref Range Status   Specimen Description ABSCESS ABDOMEN  Final   Special Requests NONE  Final   Gram Stain   Final    MODERATE WBC PRESENT,BOTH PMN AND MONONUCLEAR ABUNDANT GRAM NEGATIVE RODS MODERATE GRAM POSITIVE COCCI IN CHAINS FEW GRAM POSITIVE RODS    Culture   Final    MODERATE PROTEUS MIRABILIS MODERATE KLEBSIELLA PNEUMONIAE MODERATE VIRIDANS STREPTOCOCCUS FEW PREVOTELLA DENTICOLA FEW BACTEROIDES SPECIES BETA LACTAMASE POSITIVE Performed at [facility], [place] [address], [place], [place] [address]    Report Status 08/09/2018 FINAL  Final   Organism ID, Bacteria PROTEUS MIRABILIS  Final   Organism ID, Bacteria KLEBSIELLA PNEUMONIAE  Final      Susceptibility   Klebsiella pneumoniae - MIC*    AMPICILLIN RESISTANT Resistant     CEFAZOLIN <=4 SENSITIVE Sensitive     CEFEPIME <=1 SENSITIVE Sensitive     CEFTAZIDIME <=1 SENSITIVE Sensitive     CEFTRIAXONE <=1 SENSITIVE Sensitive     CIPROFLOXACIN <=0.25 SENSITIVE Sensitive     GENTAMICIN <=1 SENSITIVE Sensitive     IMIPENEM <=0.25 SENSITIVE Sensitive      TRIMETH/SULFA <=20 SENSITIVE Sensitive     AMPICILLIN/SULBACTAM 4 SENSITIVE Sensitive     PIP/TAZO <=4 SENSITIVE Sensitive     Extended ESBL NEGATIVE Sensitive     * MODERATE KLEBSIELLA PNEUMONIAE   Proteus mirabilis - MIC*    AMPICILLIN 4 SENSITIVE Sensitive     CEFAZOLIN <=4 SENSITIVE Sensitive     CEFEPIME <=1 SENSITIVE Sensitive     CEFTAZIDIME <=1 SENSITIVE Sensitive     CEFTRIAXONE <=1 SENSITIVE Sensitive     CIPROFLOXACIN >=4 RESISTANT Resistant     GENTAMICIN <=1 SENSITIVE Sensitive     IMIPENEM <=0.25 SENSITIVE Sensitive     TRIMETH/SULFA <=20 SENSITIVE Sensitive     AMPICILLIN/SULBACTAM <=  2 SENSITIVE Sensitive     PIP/TAZO <=4 SENSITIVE Sensitive     * MODERATE PROTEUS MIRABILIS  Aerobic/Anaerobic Culture (surgical/deep wound)     Status: None   Collection Time: 08/04/18 12:22 PM  Result Value Ref Range Status   Specimen Description ABSCESS LEFT FLANK  Final   Special Requests NONE  Final   Gram Stain   Final    MODERATE WBC PRESENT,BOTH PMN AND MONONUCLEAR MODERATE GRAM POSITIVE COCCI IN CHAINS FEW GRAM POSITIVE COCCI IN CLUSTERS MODERATE GRAM NEGATIVE RODS FEW GRAM POSITIVE RODS Performed at [facility], [place] [address] [place], [place] [address]    Culture   Final    MODERATE PROTEUS MIRABILIS MODERATE KLEBSIELLA PNEUMONIAE MODERATE VIRIDANS STREPTOCOCCUS FEW BACTEROIDES VULGATUS BETA LACTAMASE POSITIVE FEW PREVOTELLA DENTICOLA    Report Status 08/09/2018 FINAL  Final   Organism ID, Bacteria PROTEUS MIRABILIS  Final   Organism ID, Bacteria KLEBSIELLA PNEUMONIAE  Final      Susceptibility   Klebsiella pneumoniae - MIC*    AMPICILLIN RESISTANT Resistant     CEFAZOLIN <=4 SENSITIVE Sensitive     CEFEPIME <=1 SENSITIVE Sensitive     CEFTAZIDIME <=1 SENSITIVE Sensitive     CEFTRIAXONE <=1 SENSITIVE Sensitive     CIPROFLOXACIN <=0.25 SENSITIVE Sensitive     GENTAMICIN <=1 SENSITIVE Sensitive     IMIPENEM <=0.25 SENSITIVE Sensitive     TRIMETH/SULFA 40  SENSITIVE Sensitive     AMPICILLIN/SULBACTAM 4 SENSITIVE Sensitive     PIP/TAZO 8 SENSITIVE Sensitive     Extended ESBL NEGATIVE Sensitive     * MODERATE KLEBSIELLA PNEUMONIAE   Proteus mirabilis - MIC*    AMPICILLIN 4 SENSITIVE Sensitive     CEFAZOLIN <=4 SENSITIVE Sensitive     CEFEPIME <=1 SENSITIVE Sensitive     CEFTAZIDIME <=1 SENSITIVE Sensitive     CEFTRIAXONE <=1 SENSITIVE Sensitive     CIPROFLOXACIN >=4 RESISTANT Resistant     GENTAMICIN <=1 SENSITIVE Sensitive     IMIPENEM <=0.25 SENSITIVE Sensitive     TRIMETH/SULFA <=20 SENSITIVE Sensitive     AMPICILLIN/SULBACTAM <=2 SENSITIVE Sensitive     PIP/TAZO <=4 SENSITIVE Sensitive     * MODERATE PROTEUS MIRABILIS    Studies/Results: No results found.  Assessment/Plan: 72yo with solitary kidney with stent in place, SP tube and intermittent gross hematuria  1. I discussed the the patient the etiologies of gross hematuria and that his hematuria is likely related to bladder irritation from the stent or SP tube. He does not require further intervention from a urology standpoint. I will have him see me int he office to coordinate renal calculus extraction and stent removal.     LOS: 11 days   Nicolette Bang 08/13/2018, 5:05 PM

## 2018-08-13 NOTE — Progress Notes (Signed)
Internal Medicine Attending:   I saw and examined the patient. I reviewed the resident's note and I agree with the resident's findings and plan as documented in the resident's note.  Patient states that he feels better today.  He still continues to have a poor appetite and some mild nausea but does not want any antinausea medications.  Patient was initially admitted with sepsis secondary to retroperitoneal and psoas abscess.  Patient remains afebrile.  Drain still in place with output approximately 30 cc in the past 24 hours.  Continue with Unasyn day 7.  We will switch to piperacillin/tazobactam on discharge for total of 6 weeks of IV antibiotics and then convert him to oral Augmentin for additional 1 to 2 months.  We will repeat imaging of his abdomen once the drain output decreases.  Will follow palliative care consult for goals of care discussion.  Of note, patient has hematuria on his UA and his urine appeared dark over the last couple of days.  Urine color has improved today.  We will follow-up urology evaluation.  No further work-up at this time.  Hemoglobin has remained stable.

## 2018-08-13 NOTE — Progress Notes (Signed)
CSW continuing to follow for discharge needs.  Tenicia Gural LCSW [phone]  

## 2018-08-13 NOTE — Progress Notes (Signed)
   Subjective: Jerry [name] reported feeling better today.  Said his abdominal pain on the left upper quadrant is most completely resolved.  He continues to experience nausea but is refusing any antinausea medications; he relates them with his functional decline on his kidney was taken out.  He has not much but likes to drink hot tea to soothe his throat.  Denied vomiting.  Objective:  Vital signs in last 24 hours: Vitals:   08/12/18 1000 08/12/18 1337 08/12/18 2143 08/13/18 0535  BP: (!) 101/55 (!) 93/52 (!) 105/50 (!) 101/55  Pulse: 75 66 67 69  Resp:  17 17 16   Temp: 97.9 F (36.6 C) 98 F (36.7 C) 98.1 F (36.7 C) 98 F (36.7 C)  TempSrc: Oral Oral Oral Oral  SpO2:  (!) 89% 96% 96%  Weight:      Height:       Physical Exam  Constitutional: He is oriented to person, place, and time and well-developed, well-nourished, and in no distress.  Cardiovascular: Normal rate, regular rhythm and normal heart sounds.  No murmur heard. Pulmonary/Chest: Effort normal and breath sounds normal. No respiratory distress. He has no wheezes.  Abdominal: Soft. Bowel sounds are normal. He exhibits no distension. There is no tenderness.  Musculoskeletal: He exhibits edema.  Neurological: He is alert and oriented to person, place, and time.  Skin: Skin is dry.  Suprapubic catheter clean and dry, dark urine is improving in foley bag    Assessment/Plan:  Active Problems:   Psoas abscess, left (HCC)   Malnutrition of moderate degree   Retroperitoneal abscess (HCC)   Nephrolithiasis  Sepsis secondary to psoas abscess -afebrile, WBC 11 -drains still in place, output ~30ccin the past 24 hours - continue Unasynday 7 per ID,can switch to pip/tazo at discharge for a total of 6 weeks of treatment then convert to amox/clav 875 mg bid for additional 1-2 months;appreciate IDrecommendations  -repeatimaging ifdecline in patient's status or no more drainage. Could consider imaging under fluoroscopy  with water enema to further evaluate forpossible fistula - IR managing drain care - Pt is agreeable to central access at chest wall but not PICC line, per ID - Palliative care consulted, due to high volume they are unable to see the patient today   Acute on chronic CKD UTI -Urine color improved today in foley bag  - urology consulted, follow up with recommendations  - UA amber and cloudy in appearance, large Hgb, protein 100, leukocytes large, RBC >50, WBC >50, bacteria rare - Hgb stable, WBC 11 - suprapubic catheter in place without signs of infection; he normally gets this changed on the sixth of every month  Normocytic anemia Hgb 8, stable -repeat stat H&H if changes in vitals  Hx of HFpEF -Monitor for signs of hypervolemia  Dispo: Anticipated dischargepending drain output. Once this has decreased we will discuss plans to remove drains with IR and central access placement for continued IV therapy.  Mike Craze, DO 08/13/2018, 7:39 AM Pager: [phone]

## 2018-08-14 LAB — CBC
HEMATOCRIT: 29.2 % — AB (ref 39.0–52.0)
Hemoglobin: 8.3 g/dL — ABNORMAL LOW (ref 13.0–17.0)
MCH: 26 pg (ref 26.0–34.0)
MCHC: 28.4 g/dL — AB (ref 30.0–36.0)
MCV: 91.5 fL (ref 80.0–100.0)
PLATELETS: 406 10*3/uL — AB (ref 150–400)
RBC: 3.19 MIL/uL — ABNORMAL LOW (ref 4.22–5.81)
RDW: 16 % — AB (ref 11.5–15.5)
WBC: 11.7 10*3/uL — ABNORMAL HIGH (ref 4.0–10.5)
nRBC: 0 % (ref 0.0–0.2)

## 2018-08-14 LAB — GLUCOSE, CAPILLARY: Glucose-Capillary: 78 mg/dL (ref 70–99)

## 2018-08-14 NOTE — Progress Notes (Signed)
Internal Medicine Attending:   I saw and examined the patient. I reviewed the resident's note and I agree with the resident's findings and plan as documented in the resident's note.  Patient feels well this morning with no new complaints.  He still has a poor appetite and is only drinking liquids and eating soft foods like Jell-O and applesauce.  He refuses antinausea medication.  Patient was initially admitted to the hospital with sepsis secondary to a left retroperitoneal and psoas abscess.  Patient with decreased output from his drains.  Will monitor output today and if it remains less than 10 cc will obtain IR follow-up and possible repeat imaging prior to drain removal.  Continue with Unasyn day 8.  Patient will need total of 6 weeks of IV antibiotics prior to conversion to oral Augmentin for an additional 1 to 2 months.  Patient is also noted to have hematuria.  Urology follow-up and recommendations appreciated.  Hematuria is likely secondary to bladder irritation from the stent or suprapubic catheter.  No further intervention at this point.  He will follow-up with urology as an outpatient for renal calculus extraction and stent removal.

## 2018-08-14 NOTE — Progress Notes (Signed)
   Subjective: Mr. Everage reported feeling better this morning. He denied nausea, vomiting, or fevers. He said his abdominal pain has improved but having some LLQ tenderness today. He said he is not able to eat much because of his nausea but continued to deny anti-nausea medications. He said he ate some jello, apple sauce and lots of liquids.  Objective:  Vital signs in last 24 hours: Vitals:   08/12/18 2143 08/13/18 0535 08/13/18 2119 08/14/18 0602  BP: (!) 105/50 (!) 101/55 (!) 99/58 (!) 98/58  Pulse: 67 69 70 76  Resp: 17 16 18    Temp: 98.1 F (36.7 C) 98 F (36.7 C) 97.8 F (36.6 C) 98.8 F (37.1 C)  TempSrc: Oral Oral    SpO2: 96% 96% 97% 97%  Weight:      Height:       Physical Exam  Constitutional: No distress.  Cardiovascular: Normal rate and regular rhythm.  No murmur heard. Pulmonary/Chest: Effort normal and breath sounds normal. No respiratory distress. He has no wheezes.  Abdominal: Soft. Bowel sounds are normal. He exhibits no distension. There is tenderness.  LLQ tenderness  Musculoskeletal: He exhibits edema.  Skin: Skin is warm and dry. He is not diaphoretic.  Urine is still dark orange in color, catheter site is clean and dry    Assessment/Plan:  Active Problems:   Psoas abscess, left (HCC)   Malnutrition of moderate degree   Retroperitoneal abscess (HCC)   Nephrolithiasis  Sepsis secondary to psoas abscess -afebrile, WBC 11.7 -drains still in place, output ~5 ccin the past 12 hours; will monitor for another 24 hours and if it remains <10 cc will proceed with repeat CT abdomen and possible drain removal - Unasynday8; per ID,can switch to pip/tazo at discharge for a total of 6 weeks of treatment then convert to amox/clav 875 mg bid for additional 1-2 months -repeatimaging ifdecline in patient's status or no more drainage. Could consider imaging under fluoroscopy with water enema to further evaluate forpossible fistula - IR managing drain  care - Pt is agreeable to central access at chest wall but not PICC line, per ID - Palliative care consulted, due to high volume they have been unable to see the patient   Acute on chronic CKD UTI -Urine color remains dark orange in foley bag  - Per urology, hematuria is likely related to bladder irritation from stent or SP tube. Recommended outpatient renal calculus extraction and stent removal; appreciate recommendations - Hgb stable, WBC 11.7 - suprapubic catheter in place without signs of infection  Normocytic anemia Hgb 8, stable -repeat stat H&H if changes in vitals  Hx of HFpEF -Monitor for signs of hypervolemia - consider restarting lasix 20 mg home dose if LE edema worsens  Dispo: Anticipated dischargepending drain output and removal. Once this has decreased we will discuss plans to remove drains with IR and central access placement for continued IV therapy.   Mike Craze, DO 08/14/2018, 6:47 AM Pager: [phone]

## 2018-08-14 NOTE — Progress Notes (Signed)
PT Cancellation Note  Patient Details Name: Jerry [name] MRN: [id number] DOB: 1945-10-30   Cancelled Treatment:    Reason Eval/Treat Not Completed: Patient declined, no reason specified.   Unable to be encouraged to participate. 08/14/2018  Donnella Sham, [place] [phone]  (pager) [phone]  (office)   Tessie Fass Agostino Gorin 08/14/2018, 2:21 PM

## 2018-08-14 NOTE — Progress Notes (Signed)
    Referring Physician(s): Dr Dareen Piano  Supervising Physician: Marybelle Killings  Patient Status:  [facility] - In-pt  Chief Complaint:  Left RP and left flank abscess drains placed in IR 10/20  Subjective:  Left retroperitoneal and left psoas abscess No record of OP 20 cc in bag and JP Purulent brown Afeb; wbc 11.7  MODERATE PROTEUS MIRABILIS  MODERATE KLEBSIELLA PNEUMONIAE  MODERATE VIRIDANS STREPTOCOCCUS  FEW PREVOTELLA DENTICOLA  FEW BACTEROIDES SPECIES  BETA LACTAMASE POSITIVE    Allergies: Patient has no known allergies.  Medications: Prior to Admission medications   Medication Sig Start Date End Date Taking? Authorizing Provider  acetaminophen (TYLENOL) 325 MG tablet Take 650 mg by mouth 3 (three) times daily.   Yes [provider]  polyethylene glycol (MIRALAX) packet Take 17 g by mouth 2 (two) times daily. Patient not taking: Reported on 08/02/2018 09/26/17   Modena Jansky, MD  senna (SENOKOT) 8.6 MG TABS tablet Take 2 tablets (17.2 mg total) by mouth at bedtime as needed for mild constipation or moderate constipation. Patient not taking: Reported on 08/02/2018 09/26/17   Modena Jansky, MD  vitamin B-12 (CYANOCOBALAMIN) 1000 MCG tablet Take 2 tablets (2,000 mcg total) by mouth daily. Patient not taking: Reported on 08/02/2018 09/26/17   Modena Jansky, MD     Vital Signs: BP (!) 98/58 (BP Location: Right Arm)   Pulse 76   Temp 98.8 F (37.1 C)   Resp 18   Ht 5\' 11"  (1.803 m)   Wt 223 lb 1.7 oz (101.2 kg)   SpO2 97%   BMI 31.12 kg/m   Physical Exam  Constitutional: He is oriented to person, place, and time.  Neurological: He is alert and oriented to person, place, and time.  Skin: Skin is warm and dry.  Sites of drains are clean and dry NT Drains flush easily OP purulent 20-30 cc ea     Imaging: No results found.  Labs:  CBC: Recent Labs    08/08/18 0342 08/09/18 0444 08/12/18 1004 08/14/18 0739  WBC 13.3* 9.8 11.0*  11.7*  HGB 7.5* 7.6* 8.0* 8.3*  HCT 24.8* 25.8* 27.8* 29.2*  PLT 335 376 433* 406*    COAGS: Recent Labs    08/03/18 0603  INR 1.36  APTT 43*    BMP: Recent Labs    08/08/18 0342 08/09/18 0444 08/11/18 1246 08/12/18 1004  NA 135 136 135 136  K 3.6 3.4* 3.3* 3.0*  CL 109 106 105 106  CO2 20* 22 22 25   GLUCOSE 89 83 127* 94  BUN 22 19 15 12   CALCIUM 9.1 9.2 8.7* 9.0  CREATININE 1.43* 1.44* 1.36* 1.22  GFRNONAA 47* 46* 50* 57*  GFRAA 54* 54* 58* >60    LIVER FUNCTION TESTS: Recent Labs    09/21/17 0743 08/02/18 1921 08/11/18 1246  BILITOT 1.1 0.5 0.3  AST 39 20 13*  ALT 19 15 8   ALKPHOS 70 71 56  PROT 7.4 7.1 5.9*  ALBUMIN 2.7* 1.9* 1.7*    Assessment and Plan:  Retroperitoneal and psoas abscess drains intact Consider re imaging to evaluate collections Will follow   Electronically Signed: Asa Baudoin A, PA-C 08/14/2018, 3:28 PM   I spent a total of 15 Minutes at the the patient's bedside AND on the patient's hospital floor or unit, greater than 50% of which was counseling/coordinating care for psoas and RP abscess drains

## 2018-08-15 ENCOUNTER — Inpatient Hospital Stay (HOSPITAL_COMMUNITY): Payer: Medicare Other

## 2018-08-15 DIAGNOSIS — Z515 Encounter for palliative care: Secondary | ICD-10-CM

## 2018-08-15 DIAGNOSIS — Z7189 Other specified counseling: Secondary | ICD-10-CM

## 2018-08-15 DIAGNOSIS — R6520 Severe sepsis without septic shock: Secondary | ICD-10-CM

## 2018-08-15 DIAGNOSIS — E440 Moderate protein-calorie malnutrition: Secondary | ICD-10-CM

## 2018-08-15 DIAGNOSIS — N200 Calculus of kidney: Secondary | ICD-10-CM

## 2018-08-15 LAB — CBC
HCT: 29.5 % — ABNORMAL LOW (ref 39.0–52.0)
HEMOGLOBIN: 8.4 g/dL — AB (ref 13.0–17.0)
MCH: 26.1 pg (ref 26.0–34.0)
MCHC: 28.5 g/dL — AB (ref 30.0–36.0)
MCV: 91.6 fL (ref 80.0–100.0)
NRBC: 0 % (ref 0.0–0.2)
Platelets: 433 10*3/uL — ABNORMAL HIGH (ref 150–400)
RBC: 3.22 MIL/uL — AB (ref 4.22–5.81)
RDW: 15.9 % — ABNORMAL HIGH (ref 11.5–15.5)
WBC: 10.8 10*3/uL — AB (ref 4.0–10.5)

## 2018-08-15 LAB — GLUCOSE, CAPILLARY
GLUCOSE-CAPILLARY: 87 mg/dL (ref 70–99)
Glucose-Capillary: 67 mg/dL — ABNORMAL LOW (ref 70–99)

## 2018-08-15 MED ORDER — PRO-STAT SUGAR FREE PO LIQD
30.0000 mL | Freq: Two times a day (BID) | ORAL | Status: DC
  Administered 2018-08-17 – 2018-08-18 (×3): 30 mL via ORAL
  Filled 2018-08-15 (×7): qty 30

## 2018-08-15 NOTE — Progress Notes (Signed)
CSW continuing to follow for discharge needs.  Jerry [name] LCSW [phone]  

## 2018-08-15 NOTE — Progress Notes (Signed)
   Subjective: Jerry [name] reported feeling about the same today. He denies any vomiting, abdominal pain.  He was able to tolerate applesauce this morning.  Continuing to have nausea.   Objective:  Vital signs in last 24 hours: Vitals:   08/14/18 0602 08/14/18 1623 08/14/18 2331 08/15/18 0612  BP: (!) 98/58 104/60 109/62 113/61  Pulse: 76 77 78 77  Resp:  18 16 16   Temp: 98.8 F (37.1 C) 97.9 F (36.6 C) 98.3 F (36.8 C) 99.1 F (37.3 C)  TempSrc:  Oral Oral Oral  SpO2: 97% 97% 95% 96%  Weight:      Height:       Physical Exam  Constitutional: He is oriented to person, place, and time. No distress.  Cardiovascular: Normal rate and regular rhythm.  No murmur heard. Pulmonary/Chest: Effort normal and breath sounds normal. No respiratory distress.  Abdominal: Soft. Bowel sounds are normal. He exhibits no distension. There is no tenderness.  Musculoskeletal: He exhibits edema.  Neurological: He is alert and oriented to person, place, and time.  Skin: Skin is warm and dry. He is not diaphoretic.  Drain output still appears light brown, foley bag still has dark red orange urine    Assessment/Plan:  Active Problems:   Psoas abscess, left (HCC)   Malnutrition of moderate degree   Retroperitoneal abscess (HCC)   Nephrolithiasis  Sepsis secondary to psoas abscess -afebrile, WBC 10.8 -drains still in place, output ~15 ccin the past 12 hours - discussed with IR, will proceed with repeat CT abdomen pelvis with no contrast today and likely fluoroscopy tomorrow with possible drain removal  - Unasynday9;per ID,can switch to pip/tazo at discharge for a total of 6 weeks of treatment then convert to amox/clav 875 mg bid for additional 1-2 months - IR managing drain care - Pt is agreeable to central access at chest wall but not PICC line, per ID -Palliative care consulted  Acute on chronic CKD UTI -Urine color remains dark red orange in foley bag -Per urology,  hematuria is likely related to bladder irritation from stent or SP tube. Recommended outpatient renal calculus extraction and stent removal; appreciate recommendations  Normocytic anemia Hgb8, stable -repeat stat H&H if changes in vitals  Hx of HFpEF -Monitor for signs of hypervolemia - consider restarting lasix 20 mg home dose if LE edema worsens  Dispo: Anticipated dischargepending drain output and removal. Repeat imaging today with possible drain removal tomorrow.  Jerry Craze, DO 08/15/2018, 8:33 AM Pager: [phone]

## 2018-08-15 NOTE — Progress Notes (Addendum)
Nutrition Follow-up  DOCUMENTATION CODES:   Non-severe (moderate) malnutrition in context of chronic illness, Obesity unspecified  INTERVENTION:  - Magic cup BID with meals, each supplement provides 290 kcal and 9 grams of protein  - Vital Cuisine Hormel Shake BID, each supplement provides 500-520 kcal and 22 grams of protein  - Pro-stat 30 ml BID, each supplement provides 100 kcal and 15 grams of protein  - Continue Regular diet to optimize menu choices  - Encourage adequate PO intake  NUTRITION DIAGNOSIS:   Moderate Malnutrition related to chronic illness (CHF) as evidenced by mild fat depletion, moderate fat depletion, mild muscle depletion, moderate muscle depletion, percent weight loss (18.5% weight loss in 10 months).  Ongoing  GOAL:   Patient will meet greater than or equal to 90% of their needs  Unmet  MONITOR:   Supplement acceptance, Weight trends, Labs, Skin, I & O's, PO intake  REASON FOR ASSESSMENT:   Consult Assessment of nutrition requirement/status, Poor PO  ASSESSMENT:   72 yo Male with complicated urologic history presenting with malaise. He was found to have abscess within the left psoas on imaging with leukocytosis and elevated lactate. Unclear etiology of his psoas abscess.  10/20 - s/p IR guided aspiration of left retroperitoneal abscess and left flank abscess, 2 drains placed  Per MD note, pt not able to eat much because of his nausea but is continuing to refuse anti-nausea medications.  Noted wt loss of 6 lbs since admission. Suspect weight loss is related to negative fluid balance.  Spoke with pt at bedside. Pt reports feeling tired and still having no appetite. Pt also endorses nausea, stating, "when I swallow food, I know it will come right back up." Pt states that this happens whenever he eats any "real" solid foods.  Pt reports that over the past day, he has tolerated only water and juice. For dinner last night, pt states, "I had water  and juice." Pt repeated this when asked about his lunch meal yesterday. Pt states that he was tolerating applesauce a few days ago but not recently. Pt shares that he tolerates a food or beverage once or twice but then is unable to have it after that. Pt attributes this to his antibiotics.  Pt is pleased that he was able to eat half of a banana with breakfast meal and states that he ordered a grilled cheese sandwich for lunch. RD encouraged pt to at least try a few bites of the sandwich. Pt stated that he would try.  RD encouraged pt to take antiemetics when offered to help with nausea/vomiting; however, pt states, "I'm not going down that route" and that these medications made him "sicker" when he took them during his last admission.  Pt continues to refuse to try Boost Breeze or Ensure Enlive, stating, "I don't like them." Pt is willing to try Pro-stat as it is a gel supplement and can be taken quickly vs an Ensure or Boost that is a larger portion.  Pt asks, "I know this is a long shot, but do you have milkshakes?" RD offered thickened Hormel Shakes as an option, and pt agreed to try this.  Noted palliative care consult pending.  Meal Completion: 0-75% x last 8 meals  Medications reviewed and include: IV antibiotics  Labs reviewed: potassium 3.0 on 10/28 (L), hemoglobin 8.4 (L), HCT 29.5 (L) CBG's: 67, 78, 80 x last 3 days  UOP: 1600 ml x 24 hours JP drain: 15 ml x 24 hours I/O's: -14.1  L since admit  Diet Order:   Diet Order            Diet regular Room service appropriate? Yes; Fluid consistency: Thin  Diet effective now              EDUCATION NEEDS:   No education needs have been identified at this time  Skin:  Skin Assessment: Skin Integrity Issues: Other: MASD to buttocks  Last BM:  10/30 (large type 6)  Height:   Ht Readings from Last 1 Encounters:  08/03/18 5\' 11"  (1.803 m)    Weight:   Wt Readings from Last 1 Encounters:  08/12/18 101.2 kg    Ideal  Body Weight:  78.18 kg  BMI:  Body mass index is 31.12 kg/m.  Estimated Nutritional Needs:   Kcal:  1800-2000  Protein:  90-105 gm  Fluid:  1.8-2.0 L    Gaynell Face, MS, RD, LDN Inpatient Clinical Dietitian Pager: [phone] Weekend/After Hours: [phone]

## 2018-08-15 NOTE — Progress Notes (Signed)
Physical Therapy Treatment Patient Details Name: Jerry [name] MRN: [id number] DOB: 02-28-1946 Today's Date: 08/15/2018    History of Present Illness Jerry [name] is a 72yo male who comes to [facility] on 10/18 p 1W decreased appetite, lethargy, found to be septic from a psoas abscess. PTA pt has been at [facility] then SNF, reports unable to AMB past 9-34months, but was able to self propel in WC. Pt reports needing hoyer lift transfers from bed to [place].     PT Comments    Upon PT arrival pt was lying in bed with heel boots donned and R LE in excessive external rotation. He stated he has been waiting for the nurse to "hook him back up". Nursing was called and said that he is scheduled to be placed on IV at 17:00. He refused to participate in bed mobility due to back pain. Pt spoke in a quiet, whispering voice entire session. This session focused on LE and UE strengthening in supine. Pt proceeded to tell therapist when asked to do heel slides that he was "paralyzed from the waist down", but was able to participate in all other LE exercises. Pt requires max encouragement to participate in therapy and would benefit from cont'd PT in order to be d/c to SNF.    Follow Up Recommendations  SNF;Supervision/Assistance - 24 hour;Home health PT     Equipment Recommendations  None recommended by PT    Recommendations for Other Services       Precautions / Restrictions Precautions Precautions: Fall Precaution Comments: Left flank drains, suprapubic catheter.  Restrictions Weight Bearing Restrictions: No    Mobility  Bed Mobility               General bed mobility comments: Pt refused to perfom bed mobility this session  Transfers                    Ambulation/Gait                 Stairs             Wheelchair Mobility    Modified Rankin (Stroke Patients Only)       Balance                                            Cognition Arousal/Alertness:  Awake/alert Behavior During Therapy: WFL for tasks assessed/performed Overall Cognitive Status: Within Functional Limits for tasks assessed                                 General Comments: pt irritable throughout session, requiring max encouragement/education to mobilize. Pt communicating in very soft voice today, whispering to therapist. Pt told therpist when trying to move RLE that he "was paralized from the waist down", but then did active ankle ROM.      Exercises Total Joint Exercises Ankle Circles/Pumps: AROM;15 reps;Right;Left;Supine Quad Sets: AROM;10 reps;Right;Left;Supine Gluteal Sets: AROM;Right;Left;10 reps;Supine Heel Slides: 10 reps;PROM;Right;Left;Supine General Exercises - Upper Extremity Elbow Flexion: AROM;10 reps;Right;Left;Other (comment);Supine(manual resistance) Elbow Extension: AROM;Right;Left;10 reps;Supine;Other (comment)(manual resistance)    General Comments        Pertinent Vitals/Pain Pain Assessment: 0-10 Pain Score: 5  Pain Location: back Pain Descriptors / Indicators: Grimacing;Discomfort Pain Intervention(s): Limited activity within patient's tolerance;Monitored during session    Home Living  Prior Function            PT Goals (current goals can now be found in the care plan section) Acute Rehab PT Goals Patient Stated Goal: none stated PT Goal Formulation: With patient Time For Goal Achievement: 08/26/18 Potential to Achieve Goals: Fair Additional Goals Additional Goal #1: Pt will tolerate semirecumbent posture in bed trunk to 70 degrees to allow for improved tolerance of mechnical lift to chair. Additional Goal #2: Pt will tolerate 20-25min bed level esercises to allow for better tolerance to bed mobility positioning for ADL completion with nursing and care givers. Progress towards PT goals: Not progressing toward goals - comment(Pt limited participation in therapy session)    Frequency     Min 2X/week      PT Plan Current plan remains appropriate    Co-evaluation              AM-PAC PT "6 Clicks" Daily Activity  Outcome Measure  Difficulty turning over in bed (including adjusting bedclothes, sheets and blankets)?: Unable Difficulty moving from lying on back to sitting on the side of the bed? : Unable Difficulty sitting down on and standing up from a chair with arms (e.g., wheelchair, bedside commode, etc,.)?: Unable Help needed moving to and from a bed to chair (including a wheelchair)?: Total Help needed walking in hospital room?: Total Help needed climbing 3-5 steps with a railing? : Total 6 Click Score: 6    End of Session   Activity Tolerance: Patient limited by pain Patient left: in bed;with call bell/phone within reach;Other (comment)(heel boots donned) Nurse Communication: Mobility status PT Visit Diagnosis: Muscle weakness (generalized) (M62.81);Difficulty in walking, not elsewhere classified (R26.2)     Time: 4174-0814 PT Time Calculation (min) (ACUTE ONLY): 19 min  Charges:  $Therapeutic Exercise: 8-22 mins                     [address], SPTA   [place] 08/15/2018, 5:00 PM

## 2018-08-15 NOTE — Progress Notes (Signed)
Internal Medicine Attending:   I saw and examined the patient. I reviewed the resident's note and I agree with the resident's findings and plan as documented in the resident's note.  Patient states that he feels okay today but still has no appetite.  Patient was initially admitted to the hospital with sepsis likely secondary to a large retroperitoneal abscess as well as psoas abscess.  Patient is status post drain placement by IR.  We will continue with Unasyn day 9 for now.  Patient's leukocytosis has improved and he has remained afebrile.  We will switch to piperacillin/tazobactam on DC.  Patient does not want a PICC line but is agreeable to the central access at chest wall.  Patient's drain output continues to decrease.  IR follow-up and recommendations appreciated.  Patient will likely need reimaging today.  Will discuss with IR as to what imaging they would want.  No further work-up at this time.  Patient also had AKI on CKD on admission.  His creatinine is now at baseline.  He was also noted to have hematuria while here.  Urology follow-up recommendations appreciated.  Hematuria is likely secondary to bladder irritation from stent or suprapubic catheter.  They recommend outpatient follow-up for renal stone extraction and stent removal.  No further work-up at this time.  Case discussed with patient as well as power of attorney at bedside.  Questions were answered and they are in agreement with plan.

## 2018-08-15 NOTE — Consult Note (Signed)
Consultation Note Date: 08/15/2018   Patient Name: Jerry [name]  DOB: 04-24-1946  MRN: [id number]  Age / Sex: 72 y.o., male  PCP: Aura Dials, MD Referring Physician: Aldine Contes, MD  Reason for Consultation: Establishing goals of care  HPI/Patient Profile: 72 y.o. male admitted on 08/02/2018 from [facility] with complaints of back pain.  Has a past medical history significant for neurogenic bladder, suprapubic catheter, diastolic congestive heart failure, pyelonephritis with abscess s/p left nephrectomy, right renal calculus s/p double J stents, sleep apnea, posttraumatic stress disorder, prostate cancer, weakness, and cervical myelopathy.  She was transferred from [place] facility in [place] after CT abdomen showed large retroperitoneal abscess.  During his ED course WBC 33.1 lactate 2.14.  UA show positive leukocytes and many bacteria.  Blood cultures were drawn.  They have 3 L of normal saline.  Started on cefepime and Flagyl.  Since admission patient has had abscess drain placed.  He continues on IV antibiotics.  His urine is dark red.  Urology has been consulted and recommends outpatient renal calculus extraction and stent removal.  Patient also continues to refuse medical treatments and anti-emetics after daily complaints of nausea. Palliative medicine team consulted for goals of care discussion.  Clinical Assessment and Goals of Care: I have reviewed medical records including lab results, imaging, Epic notes, and MAR, received report from the bedside RN, and assessed the patient. I then met at the bedside with patient to discuss diagnosis prognosis, GOC, EOL wishes, disposition and options. Offered patient to involve family members or POA however he declined. Patient is A&O x3. He is able to engage in goals of care discussion.   I introduced Palliative Medicine as specialized medical care for people  living with serious illness. It focuses on providing relief from the symptoms and stress of a serious illness. The goal is to improve quality of life for both the patient and the family.  We discussed a brief life review of the patient. 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  He also 13 years in the [place].  She reports he is divorced and has 1 son.  Reports his son lives in [place] and they have very little contact with each other.  As far as functional and nutritional status states he has chronic leg weakness and back pain.  He reports he was receiving physical therapy however he ran out of days and was no longer receiving therapy.  He states prior to admission to [facility] he lived at home alone.  Patient reports been in and out of facilities over the past several years.  States he is bedbound.  Patient reports increasing weakness and decrease in appetite for several weeks prior to admission.  He endorses at least 90 pound weight loss within the past year.  He states he generally has a good appetite but feels due to his illness his appetite has greatly decreased.  Patient appeared to become somewhat agitated with conversation based on nonverbals.  When asking questions or prompting  patient to elaborate on responses and will give answers and then turn towards window or making sighing sound.  I asked if he would like for me to proceed with discussion he replied "you are a medical team you should have all of this information in the computer wind will need to keep till you about myself and my health."  Support given, reintroduced the goal of palliative medicine, and explained to patient by reviewing medical history, life review and functional and nutritional status allows myself and other providers to better care for patient with knowledge of changes in health.  He verbalized understanding.  We discussed his current illness and what it means in the larger context of his  on-going co-morbidities.  Natural disease trajectory and expectations at EOL were discussed.  Patient verbalized understanding of his current illness and comorbidities.  I discussed with patient in regards to his points of nausea and refusal of anti-emetics.  Patient states he has had all antinausea medications that has ever been made and none of them have assisted him with nausea.  States he is not interested in continue to put medications in his body that does not any good.  Also discussed with patient in regards to declining health PT given he stated he was interested in continuing with PT at [facility].  Patient states "I am not interested in PT here in the hospital and if insurance is going to continue to dictate what I can and cannot do then I will also dictate what I can and cannot do".  I attempted to elicit values and goals of care important to the patient.    The difference between aggressive medical intervention and comfort care was considered in light of the patient's goals of care. At this time patient verbalizes his goal is to continue with aggressive medical interventions.   He reports he has advance directives and that his ex-wife [name] is his designated POA.  Attempted to discuss with patient in detail regarding his full CODE STATUS with consideration of his current illness and comorbidities.  He expressed wishes to continue with full CODE STATUS.  States if he is any place where he is unable to make decisions and require CPR or intubation his ex-wife can determine how far and how long she would like to continue with medical interventions.  Hospice and Palliative Care services outpatient were explained and offered.  At this time patient is not interested in either services.  Questions and concerns were addressed. The family was encouraged to call with questions or concerns.  PMT will continue to support holistically.   Primary Decision Maker: She is alert and oriented x3.  He is  capable of making all medical decisions.  Patient expresses that in the event he is unable to make medical decisions his designated POA is his ex-wife [name]. PATIENT    SUMMARY OF RECOMMENDATIONS    Full code-as requested by patient  Continue to treat the treatable while hospitalized with aggressive measures.  No further recommendations at this time.  Patient expressed he was not interested in outpatient Palliative services. Would recommend at minimum.   Patient most likely will continue to decline/or showed little interest in medical interventions as he choose. He made it clear he would not be taking medications if he was not interested or participating or allowing care that was not deemed of benefit by him.   Palliative Medicine team will continue to support patient, family, and medical team during hospitalization as needed. Please call.  Code Status/Advance Care Planning:  Full code  Palliative Prophylaxis:   Aspiration, Bowel Regimen, Frequent Pain Assessment and Turn Reposition  Additional Recommendations (Limitations, Scope, Preferences):  Full Scope Treatment  Psycho-social/Spiritual:   Desire for further Chaplaincy support:no  Prognosis:   Unable to determine  Discharge Planning: SNF with outpatient Palliative at minimum.       Primary Diagnoses: Present on Admission: . Psoas abscess, left ([place])   I have reviewed the medical record, interviewed the patient and family, and examined the patient. The following aspects are pertinent.  Past Medical History:  Diagnosis Date  . Acute kidney injury ([place]) 09/21/2017  . Arthritis    "hands, knees, back, legs" (06/10/2014)  . Atonic neurogenic bladder 06/30/2016  . Bilateral leg weakness 10/25/2012  . Cervical myelopathy ([place])   . Chronic diastolic congestive heart failure ([place]) 06/23/2014  . Coma ([place]) 1968   "for 2 wks"  . Fall at home 10/25/2012  . Generalized weakness 06/23/2014  . BTDVVOHY(073.7)     "monthly" (06/10/2014)  . Lower urinary tract infectious disease 10/25/2012  . Malaria   . Obesity 10/25/2012  . Presence of suprapubic catheter ([place]) 06/17/2014   Due to prostate cancer   . Prostate cancer ([place])   . PTSD (post-traumatic stress disorder)    "I'm all over that now"  . Rhabdomyolysis 09/21/2017  . Sleep apnea 10/25/2012  . Staghorn calculus 06/30/2016  . Staphylococcus aureus bacteremia 06/30/2014  . Urinary incontinence, functional 10/25/2012  . UTI (urinary tract infection) 09/2017   Social History   Socioeconomic History  . Marital status: Single    Spouse name: Not on file  . Number of children: Not on file  . Years of education: Not on file  . Highest education level: Not on file  Occupational History  . Occupation: retired  Scientific laboratory technician  . Financial resource strain: Not on file  . Food insecurity:    Worry: Not on file    Inability: Not on file  . Transportation needs:    Medical: Not on file    Non-medical: Not on file  Tobacco Use  . Smoking status: Never Smoker  . Smokeless tobacco: Never Used  . Tobacco comment: "smoked cigarettes  for ~ 1 wk in [place]"  Substance and Sexual Activity  . Alcohol use: No  . Drug use: No  . Sexual activity: Not Currently  Lifestyle  . Physical activity:    Days per week: Not on file    Minutes per session: Not on file  . Stress: Not on file  Relationships  . Social connections:    Talks on phone: Not on file    Gets together: Not on file    Attends religious service: Not on file    Active member of club or organization: Not on file    Attends meetings of clubs or organizations: Not on file    Relationship status: Not on file  Other Topics Concern  . Not on file  Social History Narrative   Admitted to [facility] 09/26/17   Single   Never smoked   Alcohol none   Military: [employer]   Full Code   Family History  Problem Relation Age of Onset  . Heart disease Mother   . Dementia Mother   .  Heart disease Father   . Dementia Father    Scheduled Meds: . acetaminophen  500 mg Oral Q4H   Or  . acetaminophen  650 mg Rectal Q4H  .  enoxaparin (LOVENOX) injection  40 mg Subcutaneous Q24H  . feeding supplement (PRO-STAT SUGAR FREE 64)  30 mL Oral BID  . sodium chloride flush  5 mL Intracatheter Q8H   Continuous Infusions: . ampicillin-sulbactam (UNASYN) IV 3 g (08/15/18 1134)   PRN Meds:.promethazine, senna-docusate Medications Prior to Admission:  Prior to Admission medications   Medication Sig Start Date End Date Taking? Authorizing Provider  acetaminophen (TYLENOL) 325 MG tablet Take 650 mg by mouth 3 (three) times daily.   Yes [provider]  polyethylene glycol (MIRALAX) packet Take 17 g by mouth 2 (two) times daily. Patient not taking: Reported on 08/02/2018 09/26/17   Modena Jansky, MD  senna (SENOKOT) 8.6 MG TABS tablet Take 2 tablets (17.2 mg total) by mouth at bedtime as needed for mild constipation or moderate constipation. Patient not taking: Reported on 08/02/2018 09/26/17   Modena Jansky, MD  vitamin B-12 (CYANOCOBALAMIN) 1000 MCG tablet Take 2 tablets (2,000 mcg total) by mouth daily. Patient not taking: Reported on 08/02/2018 09/26/17   Modena Jansky, MD   No Known Allergies Review of Systems  Constitutional: Positive for activity change, appetite change, fatigue, fever and unexpected weight change.  Gastrointestinal: Positive for abdominal pain.  Musculoskeletal: Positive for back pain.  Neurological: Positive for weakness.  All other systems reviewed and are negative.   Physical Exam  Constitutional: He is oriented to person, place, and time. Vital signs are normal. He appears well-developed. He is cooperative. He appears ill.  Cardiovascular: Normal rate, regular rhythm, normal heart sounds and normal pulses.  Pulmonary/Chest: Effort normal. He has decreased breath sounds.  Abdominal: Soft. Normal appearance and bowel sounds are  normal.  Obese, suprapubic intat   Genitourinary:  Genitourinary Comments: Tea colored urine/suprapubic   Neurological: He is alert and oriented to person, place, and time.  Skin: Skin is warm, dry and intact. Bruising noted.  Psychiatric: His speech is normal. Judgment and thought content normal. His affect is blunt. He is withdrawn. Cognition and memory are normal.  Flat affect  Nursing note and vitals reviewed.   Vital Signs: BP 113/61 (BP Location: Right Arm)   Pulse 77   Temp 99.1 F (37.3 C) (Oral)   Resp 16   Ht 5' 11"  (1.803 m)   Wt 101.2 kg   SpO2 96%   BMI 31.12 kg/m  Pain Scale: 0-10 POSS *See Group Information*: 1-Acceptable,Awake and alert Pain Score: 0-No pain   SpO2: SpO2: 96 % O2 Device:SpO2: 96 % O2 Flow Rate: .O2 Flow Rate (L/min): 2 L/min  IO: Intake/output summary:   Intake/Output Summary (Last 24 hours) at 08/15/2018 1434 Last data filed at 08/15/2018 1225 Gross per 24 hour  Intake 507.5 ml  Output 1615 ml  Net -1107.5 ml    LBM: Last BM Date: 08/14/18 Baseline Weight: Weight: 117.9 kg Most recent weight: Weight: 101.2 kg     Palliative Assessment/Data: PPS 30%   Time In: 0830 Time Out: 0935 Time Total: 65 min.   Greater than 50%  of this time was spent counseling and coordinating care related to the above assessment and plan.  Signed by:  Alda Lea, AGPCNP-BC Palliative Medicine Team  Phone: [phone] Fax: [phone] Pager: [phone] Amion: Bjorn Pippin    Please contact Palliative Medicine Team phone at [phone] for questions and concerns.  For individual provider: See Shea Evans

## 2018-08-16 LAB — BASIC METABOLIC PANEL
ANION GAP: 11 (ref 5–15)
BUN: 9 mg/dL (ref 8–23)
CALCIUM: 9 mg/dL (ref 8.9–10.3)
CHLORIDE: 97 mmol/L — AB (ref 98–111)
CO2: 25 mmol/L (ref 22–32)
CREATININE: 1.08 mg/dL (ref 0.61–1.24)
GFR calc non Af Amer: >60 mL/min (ref >60)
Glucose, Bld: 87 mg/dL (ref 70–99)
Potassium: 3.5 mmol/L (ref 3.5–5.1)
SODIUM: 133 mmol/L — AB (ref 135–145)

## 2018-08-16 LAB — GLUCOSE, CAPILLARY
GLUCOSE-CAPILLARY: 68 mg/dL — AB (ref 70–99)
Glucose-Capillary: 87 mg/dL (ref 70–99)

## 2018-08-16 NOTE — Progress Notes (Signed)
Referring Physician(s): Dr Dareen Piano  Supervising Physician: Dr. Earleen Newport  Patient Status:  [facility] - In-pt  Chief Complaint:  Left RP and left flank abscess drains placed in IR 10/20  Subjective: Pt feeling ok. Not much appetite. CT done yesterday, reviewed with Dr. Earleen Newport Residual fluid at each collection, drains in good position.  Allergies: Patient has no known allergies.  Medications:  Current Facility-Administered Medications:  .  acetaminophen (TYLENOL) tablet 500 mg, 500 mg, Oral, Q4H, 500 mg at 08/16/18 0820 **OR** acetaminophen (TYLENOL) suppository 650 mg, 650 mg, Rectal, Q4H, Hoffman, Jessica Ratliff, DO .  Ampicillin-Sulbactam (UNASYN) 3 g in sodium chloride 0.9 % 100 mL IVPB, 3 g, Intravenous, Q6H, Carlyle Basques, MD, Last Rate: 200 mL/hr at 08/16/18 0521, 3 g at 08/16/18 0521 .  enoxaparin (LOVENOX) injection 40 mg, 40 mg, Subcutaneous, Q24H, Rolla Flatten, RPH, 40 mg at 08/16/18 4818 .  feeding supplement (PRO-STAT SUGAR FREE 64) liquid 30 mL, 30 mL, Oral, BID, Narendra, Nischal, MD .  promethazine (PHENERGAN) tablet 12.5 mg, 12.5 mg, Oral, Q6H PRN, Chundi, Vahini, MD .  senna-docusate (Senokot-S) tablet 1 tablet, 1 tablet, Oral, QHS PRN, Chundi, Vahini, MD .  sodium chloride flush (NS) 0.9 % injection 5 mL, 5 mL, Intracatheter, Q8H, Wagner, Jaime, DO, 5 mL at 08/16/18 0602    Vital Signs: BP (!) 99/59 (BP Location: Right Arm)   Pulse 72   Temp 98.2 F (36.8 C) (Oral)   Resp 14   Ht 5\' 11"  (1.803 m)   Wt 96.6 kg   SpO2 97%   BMI 29.70 kg/m   Physical Exam  Constitutional: He is oriented to person, place, and time.  Neurological: He is alert and oriented to person, place, and time.  Skin: Skin is warm and dry.  Sites of drains are clean and dry NT  More superior drain going to RP collection aggresively irrigated with 10cc NS multiple times with prompt return of foul smelling purulent fluid. Drain now connected to new suction bulb  Inferior  drain going to the superficial flank collection  flushed with NS easily, scant return of turbid fluid. Connected to new bulb suction.    Imaging: Ct Abdomen Pelvis Wo Contrast  Result Date: 08/15/2018 CLINICAL DATA:  Left flank abscess drained on 08/04/2018. Re-evaluate abscess. EXAM: CT ABDOMEN AND PELVIS WITHOUT CONTRAST TECHNIQUE: Multidetector CT imaging of the abdomen and pelvis was performed following the standard protocol without IV contrast. COMPARISON:  CT, 08/03/2018 FINDINGS: Lower chest: Trace left pleural effusion. Dependent atelectasis in the right lower lobe and minimally in the left lower lobe. Hepatobiliary: Liver normal in size. No mass or focal lesion. Large gallstone. No evidence of acute cholecystitis. No bile duct dilation. Pancreas: Unremarkable. No pancreatic ductal dilatation or surrounding inflammatory changes. Spleen: Small posterior calcification.  Normal in size.  No masses. Adrenals/Urinary Tract: No adrenal mass. Status post left nephrectomy. Left retroperitoneal collection. Shadowing artifact somewhat limits assessment. Allowing for this, the collection measures approximately 14 x 6 x 10 cm. There is nondependent air within the collection. A pigtail catheter curls within the anterior, central aspect of the collection. There is mild adjacent inflammation. The collection abuts the descending colon. There is no convincing connection between the colon and collection. Right intrarenal stones, mild to moderate collecting system dilation and right ureteral stent, stable from the prior CT. Stent extends into the distal ureter. The pigtail curl of the distal stent projects just above the right ureterovesicular junction. Suprapubic catheter lies within the  bladder.  This is stable. Stomach/Bowel: Stomach is unremarkable. Small bowel and colon are normal in caliber. No bowel wall thickening. No inflammation originating from the bowel. Normal appendix visualized. Vascular/Lymphatic: Aortic  atherosclerosis. Ectasias the iliac arteries, largest the left common iliac artery measuring 2.2 cm in diameter. No enlarged lymph nodes. No change from the prior study. Reproductive: Mild prominence of the prostate gland, stable. Other: No ascites.  No abdominal wall hernia. Musculoskeletal: No fracture or acute finding. No osteoblastic or osteolytic lesions. IMPRESSION: 1. The left retroperitoneal fluid collection persists despite placement of the pigtail drain. Overall size of the collection is similar to the prior exam allowing for differences in measurement technique. Pigtail catheter is well positioned within the collection. 2. Distal curl of the right double-J ureteral stent appears to reside just outside of the bladder in the distal right ureter. This does not appear changed from the prior CT, however. 3. No other acute abnormality within the abdomen or pelvis. Electronically Signed   By: Lajean Manes M.D.   On: 08/15/2018 14:08    Labs:  CBC: Recent Labs    08/09/18 0444 08/12/18 1004 08/14/18 0739 08/15/18 0414  WBC 9.8 11.0* 11.7* 10.8*  HGB 7.6* 8.0* 8.3* 8.4*  HCT 25.8* 27.8* 29.2* 29.5*  PLT 376 433* 406* 433*    COAGS: Recent Labs    08/03/18 0603  INR 1.36  APTT 43*    BMP: Recent Labs    08/09/18 0444 08/11/18 1246 08/12/18 1004 08/16/18 0941  NA 136 135 136 133*  K 3.4* 3.3* 3.0* 3.5  CL 106 105 106 97*  CO2 22 22 25 25   GLUCOSE 83 127* 94 87  BUN 19 15 12 9   CALCIUM 9.2 8.7* 9.0 9.0  CREATININE 1.44* 1.36* 1.22 1.08  GFRNONAA 46* 50* 57* >60  GFRAA 54* 58* >60 >60    LIVER FUNCTION TESTS: Recent Labs    09/21/17 0743 08/02/18 1921 08/11/18 1246  BILITOT 1.1 0.5 0.3  AST 39 20 13*  ALT 19 15 8   ALKPHOS 70 71 56  PROT 7.4 7.1 5.9*  ALBUMIN 2.7* 1.9* 1.7*    Assessment and Plan: (L)Retroperitoneal and flank abscess drains intact. CT reviewed, residual collections but drains appropriately positioned. Drains aggressively flushed and both  connected to new bulb suction to promote drainage. Needs TID flushes to encourage output. IR will continue to follow  Electronically Signed: Ascencion Dike, PA-C 08/16/2018, 11:22 AM   I spent a total of 15 Minutes at the the patient's bedside AND on the patient's hospital floor or unit, greater than 50% of which was counseling/coordinating care for (L)flank  and RP abscess drains

## 2018-08-16 NOTE — Progress Notes (Signed)
This AM patient's CBG was low 68; patient asymptomatic, no complaints. After 200 ml orange juice his CBG came up to 87. Will continue to monitor.

## 2018-08-16 NOTE — Progress Notes (Signed)
Internal Medicine Attending:   I saw and examined the patient. I reviewed the resident's note and I agree with the resident's findings and plan as documented in the resident's note.  Hospital day #13 for a left RP and left soft tissue flank abscess. The drain output was reduced yesterday, a CT scan however showed persistent fluid collections. After some flushing, the drain output is a little better today. The fluid is still frank pus. The drains were placed on 10/20 by Dr. Earleen Newport, the lateral drain is in the RP and the medial drain is in a soft tissue flank abscess. I think we can continue to watch the drain output. He had more soreness at the left flank today which we should also monitor. No fevers, WBC has been normal. Plan is for 6 weeks of IV antibiotics through 12/1, then oral augmentin for another 1-2 months. As long as the drains continue to have output with TID flushing, we will leave them in place and he can follow up with the IR drain clinic. He will need a tunneled central line as he has had some very poor experiences with PICCs. Then we will be ready for SNF placement to complete the IV antibiotics.  Lalla Brothers, MD

## 2018-08-16 NOTE — Progress Notes (Addendum)
   Subjective: Mr. Coury reported feeling the same today. He said he did not have much of an appetite and still felt nauseated. He is still having some LUQ abdominal pain that has improved and soreness near the drain sites.   Objective:  Vital signs in last 24 hours: Vitals:   08/15/18 1617 08/16/18 0100 08/16/18 0113 08/16/18 0525  BP: (!) 99/58  (!) 98/56 (!) 99/59  Pulse: 72  69 72  Resp: 16  14 14   Temp: 97.7 F (36.5 C)  98.8 F (37.1 C) 98.2 F (36.8 C)  TempSrc: Oral  Oral Oral  SpO2: 100%  100% 97%  Weight:  96.6 kg    Height:       Physical Exam  Constitutional: He is oriented to person, place, and time. No distress.  Cardiovascular: Normal rate.  Abdominal: Soft. He exhibits no distension. There is no tenderness.  Musculoskeletal: He exhibits edema.  Neurological: He is alert and oriented to person, place, and time.  Skin: Skin is warm and dry. He is not diaphoretic.  Suprapubic catheter clean and dry, drain output purulent brown    Assessment/Plan:  Active Problems:   Psoas abscess, left (HCC)   Malnutrition of moderate degree   Retroperitoneal abscess (HCC)   Nephrolithiasis  Left Retroperitoneal Psoas abscess -drains still in place, output in the last 24 hours has not been documented  - CT abdomen pelvis showed fluid collection persists despite placement of pigtail drain; overall size of collection is similar to prior exam - IR recommending to convert gravity bag to JP and continue to monitor drainage output - Unasynday10;per ID,can switch to pip/tazo at discharge for a total of 6 weeks of treatment then convert to amox/clav 875 mg bid for additional 1-2 months - consulted IR for chest wall tunneled catheter placement for long course of IV abx - IR managing drain care -Palliative care consulted, patient remains full code   Acute on chronic CKD UTI -Per urology, hematuria is likely related to bladder irritation from stent or SP tube.  Recommended outpatient renal calculus extraction and stent removal; appreciate recommendations  Normocytic anemia -repeat stat H&H if changes in vitals  Hx of HFpEF -Monitor for signs of hypervolemia - consider restarting lasix 20 mg home doseif LE edema worsens  Dispo: Anticipated dischargepending drain output and removal.   Rehman, Areeg N, DO 08/16/2018, 9:10 AM Pager: [phone]

## 2018-08-16 NOTE — Progress Notes (Signed)
   Patient Status: [facility] - In-pt  Assessment and Plan: Patient in need of venous access.   Tunneled central catheter placement  ______________________________________________________________________   History of Present Illness: Bosco Paparella is a 72 y.o. male   Need for long term antibiotic Retroperitoneal abscess and left psoas abscess drain place in IR 08/04/18 Pt has had PICCs before and all have "been problematic" Would like to have something more stable Now scheduled for tunneled central line Plan for Mon 11/4  Allergies and medications reviewed.   Review of Systems: A 12 point ROS discussed and pertinent positives are indicated in the HPI above.  All other systems are negative.  Review of Systems  Constitutional: Positive for fatigue. Negative for fever.  Respiratory: Negative for cough and shortness of breath.   Gastrointestinal: Positive for nausea. Negative for abdominal pain.  Genitourinary: Positive for flank pain.  Musculoskeletal: Positive for back pain.  Neurological: Positive for weakness.  Psychiatric/Behavioral: Negative for behavioral problems and confusion.    Vital Signs: BP 102/63 (BP Location: Right Arm)   Pulse 70   Temp 97.9 F (36.6 C) (Oral)   Resp 16   Ht 5\' 11"  (1.803 m)   Wt 212 lb 15.4 oz (96.6 kg)   SpO2 98%   BMI 29.70 kg/m   Physical Exam  Cardiovascular: Normal rate and regular rhythm.  Pulmonary/Chest: Effort normal.  Abdominal: Soft. Bowel sounds are normal.  Musculoskeletal: Normal range of motion.  Neurological: He is alert.  Skin: Skin is warm and dry.  Psychiatric: He has a normal mood and affect. His behavior is normal. Judgment and thought content normal.  Vitals reviewed.    Imaging reviewed.   Labs:  COAGS: Recent Labs    08/03/18 0603  INR 1.36  APTT 43*    BMP: Recent Labs    08/09/18 0444 08/11/18 1246 08/12/18 1004 08/16/18 0941  NA 136 135 136 133*  K 3.4* 3.3* 3.0* 3.5  CL 106 105 106  97*  CO2 22 22 25 25   GLUCOSE 83 127* 94 87  BUN 19 15 12 9   CALCIUM 9.2 8.7* 9.0 9.0  CREATININE 1.44* 1.36* 1.22 1.08  GFRNONAA 46* 50* 57* >60  GFRAA 54* 58* >60 >60    Scheduled for tunneled central catheter placement for long term antibiotic treatment regimen 6 weeks per ID  Scheduled for placement in IR 11/4 Pt is aware of procedure benefits and risks including but not limited to Infection; bleeding; vessel damage Agreeable to proceed Consent signed and in chart    Electronically Signed: Shallyn Constancio A, PA-C 08/16/2018, 1:48 PM   I spent a total of 15 minutes in face to face in clinical consultation, greater than 50% of which was counseling/coordinating care for venous access.Patient ID: Abdirizak Richison, male   DOB: 02-06-1946, 72 y.o.   MRN: [id number]

## 2018-08-17 DIAGNOSIS — E871 Hypo-osmolality and hyponatremia: Secondary | ICD-10-CM | POA: Diagnosis present

## 2018-08-17 DIAGNOSIS — R319 Hematuria, unspecified: Secondary | ICD-10-CM | POA: Diagnosis present

## 2018-08-17 LAB — BASIC METABOLIC PANEL
Anion gap: 4 — ABNORMAL LOW (ref 5–15)
BUN: 9 mg/dL (ref 8–23)
CO2: 25 mmol/L (ref 22–32)
Calcium: 8.7 mg/dL — ABNORMAL LOW (ref 8.9–10.3)
Chloride: 103 mmol/L (ref 98–111)
Creatinine, Ser: 1.06 mg/dL (ref 0.61–1.24)
GFR calc Af Amer: >60 mL/min (ref >60)
Glucose, Bld: 73 mg/dL (ref 70–99)
POTASSIUM: 3.5 mmol/L (ref 3.5–5.1)
SODIUM: 132 mmol/L — AB (ref 135–145)

## 2018-08-17 LAB — GLUCOSE, CAPILLARY
GLUCOSE-CAPILLARY: 68 mg/dL — AB (ref 70–99)
Glucose-Capillary: 80 mg/dL (ref 70–99)

## 2018-08-17 MED ORDER — SODIUM CHLORIDE 0.9 % IV SOLN
INTRAVENOUS | Status: DC | PRN
  Administered 2018-08-16: 1000 mL via INTRAVENOUS
  Administered 2018-08-17: 250 mL via INTRAVENOUS

## 2018-08-17 MED ORDER — SODIUM CHLORIDE 0.9 % IV SOLN
INTRAVENOUS | Status: AC
  Administered 2018-08-17 (×2): via INTRAVENOUS

## 2018-08-17 NOTE — Progress Notes (Signed)
Hypoglycemic Event  CBG: 68  Treatment: 8 ounces of orange juice  Symptoms: None  Follow-up CBG: [id number] CBG Result:80  Possible Reasons for Event: Inadequate meal intake  Patient's breakfast tray at bedside and patient encouraged to eat.  Hiram Comber, BSN, RN

## 2018-08-17 NOTE — Progress Notes (Signed)
Patient had an episode of incontinence of stool. Upon cleaning patient up, noticed patient has stage 1 pressure injury on buttocks and a skin tear noticed on medial back. Injuries cleansed and foam dressing placed. Rosalio Loud, RN at bedside assisting with skin assessment. Patient has been refusing Q2 turns throughout his hospital stay. Patient educated on importance of Q2 turns to prevent skin breakdown.

## 2018-08-17 NOTE — Progress Notes (Addendum)
   Subjective:  No acute events overnight.  Jerry [name] reports he continues to feel weak and continues to have left-sided tenderness which appears to be stable from yesterday.  He continues to have a very poor appetite but has been drinking supplements with his meals.  No other acute complaints today.  Objective:  Vital signs in last 24 hours: Vitals:   08/16/18 0525 08/16/18 1320 08/16/18 2125 08/17/18 0500  BP: (!) 99/59 102/63 (!) 99/52   Pulse: 72 70 73   Resp: 14 16 18    Temp: 98.2 F (36.8 C) 97.9 F (36.6 C) 98.3 F (36.8 C)   TempSrc: Oral Oral Oral   SpO2: 97% 98% 98%   Weight:    97 kg  Height:       General: Chronically ill-appearing male, in no acute distress CV: RRR, no mrg  Abdomen: soft, tender to palpation on the L, JP drains with mild purulent output   Assessment/Plan:  Principal Problem:   Retroperitoneal abscess (HCC) Active Problems:   Psoas abscess, left (HCC)   Malnutrition of moderate degree   Nephrolithiasis   Hyponatremia   Hematuria  # L Retroperitoneal and psoas abscess s/p IR drain placement 10/20: IR started TID flushes yesterday with increase in both JP drain outputs today.  Output remains purulent.  Remains afebrile hemodynamically stable. We will continue IV antibiotic therapy with Unasyn we will plan to switch to Zosyn at discharge for a total of 6 weeks of treatment.  ID recommended switching to p.o. Augmentin for an additional 1-2 months after completion of IV antibiotic therapy. - Appreciate IR and ID recommendations - Continue IV Unasyn day 11 - Plan for TCC 11/4, he should be ready for discharge after line placement   # Hyponatremia: Patient's sodium has been decreasing during admission in the setting of poor p.o. Intake.  We will start IVF @ 100 cc/hr and continue to monitor. He has a history of HFpEF and is on Lasix at home. We will continue to monitor volume status while on IVF.    Dispo: Pending tunneled central line placement and  SNF placement.   Jerry Roche, MD 08/17/2018, 10:48 AM Pager: [phone]

## 2018-08-17 NOTE — Progress Notes (Signed)
Internal Medicine Attending:   I saw and examined the patient. I reviewed the Dr Lorane Gell note and I agree with the resident's findings and plan as documented in the resident's note.

## 2018-08-18 LAB — BASIC METABOLIC PANEL
Anion gap: 6 (ref 5–15)
BUN: 8 mg/dL (ref 8–23)
CHLORIDE: 104 mmol/L (ref 98–111)
CO2: 25 mmol/L (ref 22–32)
CREATININE: 0.96 mg/dL (ref 0.61–1.24)
Calcium: 8.4 mg/dL — ABNORMAL LOW (ref 8.9–10.3)
GFR calc Af Amer: >60 mL/min (ref >60)
GFR calc non Af Amer: >60 mL/min (ref >60)
GLUCOSE: 72 mg/dL (ref 70–99)
POTASSIUM: 3.3 mmol/L — AB (ref 3.5–5.1)
Sodium: 135 mmol/L (ref 135–145)

## 2018-08-18 LAB — GLUCOSE, CAPILLARY: GLUCOSE-CAPILLARY: 75 mg/dL (ref 70–99)

## 2018-08-18 LAB — CBC
HCT: 29.2 % — ABNORMAL LOW (ref 39.0–52.0)
HEMOGLOBIN: 8.5 g/dL — AB (ref 13.0–17.0)
MCH: 26.6 pg (ref 26.0–34.0)
MCHC: 29.1 g/dL — AB (ref 30.0–36.0)
MCV: 91.5 fL (ref 80.0–100.0)
Platelets: 355 10*3/uL (ref 150–400)
RBC: 3.19 MIL/uL — AB (ref 4.22–5.81)
RDW: 16.4 % — ABNORMAL HIGH (ref 11.5–15.5)
WBC: 7.8 10*3/uL (ref 4.0–10.5)
nRBC: 0 % (ref 0.0–0.2)

## 2018-08-18 MED ORDER — POTASSIUM CHLORIDE CRYS ER 20 MEQ PO TBCR
40.0000 meq | EXTENDED_RELEASE_TABLET | Freq: Two times a day (BID) | ORAL | Status: AC
  Administered 2018-08-18 (×2): 40 meq via ORAL
  Filled 2018-08-18 (×2): qty 2

## 2018-08-18 MED ORDER — SODIUM CHLORIDE 0.9 % IV SOLN
INTRAVENOUS | Status: DC
  Administered 2018-08-18 (×2): via INTRAVENOUS

## 2018-08-18 NOTE — Progress Notes (Signed)
   Subjective: Mr. Depoy reported feeling the same this morning. He was still having nausea and left sided soreness. He said he is tolerating the supplements but is not able to tolerate solid foods. Continues to decline anti-nausea medications. He understood he would be getting the catheter tomorrow but expressed that he did not feel his SNF would be able to manage his drain care. He was amenable to repositioning to prevent worsening of his pressure ulcer.   Objective:  Vital signs in last 24 hours: Vitals:   08/17/18 0500 08/17/18 1332 08/17/18 2202 08/18/18 0608  BP:  108/61 (!) 96/59 (!) 104/56  Pulse:  70 73 71  Resp:  18 14 16   Temp:  97.9 F (36.6 C) 97.6 F (36.4 C) 97.7 F (36.5 C)  TempSrc:  Oral Oral Oral  SpO2:  95% 97% 98%  Weight: 97 kg     Height:       Physical Exam  Constitutional: No distress.  Cardiovascular: Normal rate, regular rhythm and normal heart sounds.  No murmur heard. Pulmonary/Chest: Effort normal and breath sounds normal. No respiratory distress. He has no wheezes.  Abdominal: Soft. Bowel sounds are normal. He exhibits no distension. There is tenderness.  LUQ and LLQ tenderness  Musculoskeletal: He exhibits no edema.  Skin: Skin is warm and dry. He is not diaphoretic.  Suprapubic catheter site clean and dry, left sided JP drains have purulent drainage    Assessment/Plan:  Principal Problem:   Retroperitoneal abscess ([place]) Active Problems:   Pressure injury of skin   Psoas abscess, left (HCC)   Malnutrition of moderate degree   Nephrolithiasis   Hyponatremia   Hematuria   L Retroperitoneal and psoas abscess s/p IR drain placement 10/20 TID JP drain flushes. Output remains purulent.  Remains afebrile, hemodynamically stable. We will continue IV antibiotic therapy with Unasyn we will plan to switch to Zosyn at discharge for a total of 6 weeks of treatment.  ID recommended switching to p.o. Augmentin for an additional 1-2 months after  completion of IV antibiotic therapy. - Appreciate IR and ID recommendations - Continue IV Unasyn day 12 - Plan for TCC 11/4, he should be ready for discharge after line placement   Pressure Injury of skin Found to have stage 1 pressure injury on buttocks and skin tear noticed on medical back. Cleansed and foam dressing placed by RN. Discussed importance of repositioning with patient and he was amenable to repositioning to prevent further skin breakdown.  Hyponatremia 135 this morning. Continue IVF @ 100 cc/hr and continue to monitor. He has a history of HFpEF and is on Lasix at home. We will continue to monitor volume status while on IVF.  - am bmp   Dispo: Pending tunneled central line placement and SNF placement.  Mike Craze, DO 08/18/2018, 8:26 AM Pager: [phone]

## 2018-08-18 NOTE — Progress Notes (Signed)
Patient agreeable to Q2 turns after encouragement. Upon adjusting patient to left side, foam dressing noted to be saturated. Dressing removed and patient's pressure injury has broken skin that is draining serous fluid. Pressure injury upgraded to stage 2 due to skin being broken with drainage. Patient educated significantly about the factors leading up to the pressure injury formation and how important it is to be compliant with treatment from here on out. Patient verbalized understanding. Will ensure q2 turns are being performed.

## 2018-08-19 ENCOUNTER — Encounter (HOSPITAL_COMMUNITY): Payer: Self-pay | Admitting: Interventional Radiology

## 2018-08-19 ENCOUNTER — Inpatient Hospital Stay (HOSPITAL_COMMUNITY): Payer: Medicare Other

## 2018-08-19 DIAGNOSIS — E871 Hypo-osmolality and hyponatremia: Secondary | ICD-10-CM

## 2018-08-19 HISTORY — PX: IR FLUORO GUIDE CV LINE RIGHT: IMG2283

## 2018-08-19 HISTORY — PX: IR US GUIDE VASC ACCESS RIGHT: IMG2390

## 2018-08-19 LAB — BASIC METABOLIC PANEL
ANION GAP: 3 — AB (ref 5–15)
BUN: 8 mg/dL (ref 8–23)
CALCIUM: 8.4 mg/dL — AB (ref 8.9–10.3)
CO2: 25 mmol/L (ref 22–32)
CREATININE: 0.88 mg/dL (ref 0.61–1.24)
Chloride: 108 mmol/L (ref 98–111)
GFR calc Af Amer: >60 mL/min (ref >60)
GLUCOSE: 74 mg/dL (ref 70–99)
Potassium: 4 mmol/L (ref 3.5–5.1)
Sodium: 136 mmol/L (ref 135–145)

## 2018-08-19 LAB — GLUCOSE, CAPILLARY
Glucose-Capillary: 62 mg/dL — ABNORMAL LOW (ref 70–99)
Glucose-Capillary: 90 mg/dL (ref 70–99)

## 2018-08-19 MED ORDER — LIDOCAINE HCL 1 % IJ SOLN
INTRAMUSCULAR | Status: AC
  Filled 2018-08-19: qty 20

## 2018-08-19 MED ORDER — DEXTROSE 50 % IV SOLN
INTRAVENOUS | Status: AC
  Filled 2018-08-19: qty 50

## 2018-08-19 MED ORDER — DEXTROSE 50 % IV SOLN
25.0000 mL | Freq: Once | INTRAVENOUS | Status: AC
  Administered 2018-08-19: 25 mL via INTRAVENOUS

## 2018-08-19 MED ORDER — LIDOCAINE HCL 1 % IJ SOLN
INTRAMUSCULAR | Status: DC | PRN
  Administered 2018-08-19: 5 mL

## 2018-08-19 NOTE — Progress Notes (Addendum)
This AM patient's CBG was 62. Patient asymptomatic, no complaints.  Because he was NPO he received 50% Dextrose IV 25 ml. CBG come up to 90. Will continue to monitor.

## 2018-08-19 NOTE — NC FL2 (Signed)
[place] MEDICAID FL2 LEVEL OF CARE SCREENING TOOL     IDENTIFICATION  Patient Name: Jerry [name] Birthdate: Feb 22, 1946 Sex: male Admission Date (Current Location): 08/02/2018  [facility] and [place] Number:  Herbalist and Address:  [facility], [place] [address] [place], [place] [address]      Provider Number: [id number]  Attending Physician Name and Address:  Axel Filler, *  Relative Name and Phone Number:  Albertine Patricia [phone]    Current Level of Care: Hospital Recommended Level of Care: [place] Prior Approval Number:    Date Approved/Denied:   PASRR Number: [id number]  Discharge Plan: SNF    Current Diagnoses: Patient Active Problem List   Diagnosis Date Noted  . Hyponatremia 08/17/2018  . Hematuria 08/17/2018  . Retroperitoneal abscess ([place])   . Nephrolithiasis   . Malnutrition of moderate degree 08/09/2018  . Psoas abscess, left ([place]) 08/02/2018  . 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 Vitamin D deficiency 12/18/2017  . Urinary tract infection due to Proteus 09/29/2017  . Fall 09/29/2017  . Acute lateral meniscus tear of left knee 09/29/2017  . Acute medial meniscus tear of left knee 09/29/2017  . Vitamin B12 deficiency 09/29/2017  . Pressure injury of skin 09/22/2017  . Acute kidney injury ([place]) 09/21/2017  . Catheter-associated urinary tract infection ([place]) 09/21/2017  . Elevated CK 09/21/2017  . Rhabdomyolysis 09/21/2017  . Staghorn calculus 06/30/2016  . Atonic neurogenic bladder 06/30/2016  . Urinary retention 08/15/2014  . Staphylococcus aureus bacteremia 06/30/2014  . Generalized weakness 06/23/2014  . Chronic diastolic congestive heart failure ([place]) 06/23/2014  . HTN (hypertension) 06/23/2014  . Prostate cancer ([place]) 06/23/2014  . Presence of suprapubic catheter ([place]) 06/17/2014  . Cervical myelopathy ([place])   . Arthritis   . Urinary incontinence, functional  10/25/2012  . Bilateral leg weakness 10/25/2012  . Fall at home 10/25/2012  . Lower urinary tract infectious disease 10/25/2012  . Sleep apnea 10/25/2012  . Medically noncompliant 10/25/2012  . Obesity 10/25/2012    Orientation RESPIRATION BLADDER Height & Weight     Self, Time, Situation, Place  Normal Continent, Indwelling catheter(Suprapubic catheter) Weight: 97 kg Height:  5\' 11"  (180.3 cm)  BEHAVIORAL SYMPTOMS/MOOD NEUROLOGICAL BOWEL NUTRITION STATUS      Incontinent Diet(Please see DC Summary)  AMBULATORY STATUS COMMUNICATION OF NEEDS Skin   Extensive Assist Verbally PU Stage and Appropriate Care, Other (Comment)(Stage II on buttocks; also has a jp drain)                       Personal Care Assistance Level of Assistance  Bathing, Feeding, Dressing Bathing Assistance: Maximum assistance Feeding assistance: Limited assistance Dressing Assistance: Limited assistance     Functional Limitations Info  Sight, Hearing, Speech Sight Info: Adequate Hearing Info: Adequate Speech Info: Adequate    SPECIAL CARE FACTORS FREQUENCY  PT (By licensed PT), OT (By licensed OT)     PT Frequency: 5x/week OT Frequency: 3x/wwk            Contractures Contractures Info: Not present    Additional Factors Info  Isolation Precautions Code Status Info: Full Allergies Info: NKA     Isolation Precautions Info: VRE     Current Medications (08/19/2018):  This is the current hospital active medication list Current Facility-Administered Medications  Medication Dose Route Frequency Provider Last Rate Last Dose  . 0.9 %  sodium chloride infusion   Intravenous PRN Axel Filler, MD 10  mL/hr at 08/17/18 0700    . 0.9 %  sodium chloride infusion   Intravenous Continuous Rehman, Areeg N, DO 75 mL/hr at 08/19/18 0601    . acetaminophen (TYLENOL) tablet 500 mg  500 mg Oral Q4H Hoffman, Jessica Ratliff, DO   500 mg at 08/19/18 0813   Or  . acetaminophen (TYLENOL) suppository 650  mg  650 mg Rectal Q4H Hoffman, Jessica Ratliff, DO      . Ampicillin-Sulbactam (UNASYN) 3 g in sodium chloride 0.9 % 100 mL IVPB  3 g Intravenous Q6H Carlyle Basques, MD 200 mL/hr at 08/19/18 0632 3 g at 08/19/18 2549  . feeding supplement (PRO-STAT SUGAR FREE 64) liquid 30 mL  30 mL Oral BID Aldine Contes, MD   30 mL at 08/18/18 2315  . promethazine (PHENERGAN) tablet 12.5 mg  12.5 mg Oral Q6H PRN Chundi, Vahini, MD      . senna-docusate (Senokot-S) tablet 1 tablet  1 tablet Oral QHS PRN Chundi, Vahini, MD      . sodium chloride flush (NS) 0.9 % injection 5 mL  5 mL Intracatheter Q8H Corrie Mckusick, DO   5 mL at 08/19/18 8264     Discharge Medications: Please see discharge summary for a list of discharge medications.  Relevant Imaging Results:  Relevant Lab Results:   Additional Information SSN [id number]  Needs 6 weeks IV [place], LCSW

## 2018-08-19 NOTE — Progress Notes (Addendum)
   Subjective: Jerry [name] reported feeling better this morning. He denied any nausea or vomiting. Continues to have left sided soreness near drain tubes. We had a long discussion with him and his POA/ex-wife about the course of treatment. All questions and concerns were answered.   Objective:  Vital signs in last 24 hours: Vitals:   08/18/18 0608 08/18/18 1643 08/18/18 2112 08/19/18 0640  BP: (!) 104/56 106/61 111/60 107/73  Pulse: 71 71 71 68  Resp: 16  18   Temp: 97.7 F (36.5 C) 97.9 F (36.6 C) 98.8 F (37.1 C) (!) 97.5 F (36.4 C)  TempSrc: Oral  Oral Oral  SpO2: 98% 99% 100% 97%  Weight:      Height:       Physical Exam  Constitutional: He is oriented to person, place, and time. No distress.  Able to speak at a normal volume today without whispering  Neurological: He is alert and oriented to person, place, and time.  Skin: Skin is warm and dry. He is not diaphoretic.    Assessment/Plan:  Principal Problem:   Retroperitoneal abscess (HCC) Active Problems:   Pressure injury of skin   Psoas abscess, left (HCC)   Malnutrition of moderate degree   Nephrolithiasis   Hyponatremia   Hematuria  L Retroperitoneal and psoas abscess s/p IR drain placement 10/20 Plans to have TCC today. Drain output remains minimal even with TID JP drain flushes. Will re-discuss case with IR to determine best next step. Outputremains purulent. Remains afebrile, hemodynamically stable. Wewill continue IV antibiotic therapy with Unasyn we will plan to switch to Zosyn at discharge for a total of 6 weeks of treatment. ID recommended switching to p.o. Augmentin for an additional 1-2 months after completion of IV antibiotic therapy. -Appreciate IR and ID recommendations -Continue IV Unasyn day 13 - Drain output ~42.5 cc in the past 24 hours  Hyponatremia 136 this morning. Disontinue IVFand continue to monitor.He has a history of HFpEF and is on Lasix at home. We will continue to monitor  volume status while on IVF. - am bmp   Dispo: Anticipated discharge pending SNF placement. Patient is medically stable for discharge.  Mike Craze, DO 08/19/2018, 7:34 AM Pager: [phone]

## 2018-08-19 NOTE — Consult Note (Addendum)
[place] Nurse wound consult note Reason for Consult: Consult requested for sacrum Wound type: 2X1X.1cm red moist partial thickness abrasion; may have been caused by a combination of moisture and shear. Appearance is NOT consistent with a pressure injury; location is irregular and jagged and with shaggy edges Dressing procedure/placement/frequency: Foam dressing to protect and promote healing. Discussed plan of care with patient. Please re-consult if further assistance is needed.  Thank-you,  Julien Girt MSN, [place], [place], [place], [place]

## 2018-08-19 NOTE — Progress Notes (Signed)
Occupational Therapy Treatment Patient Details Name: Jerry [name] MRN: [id number] DOB: 03-31-1946 Today's Date: 08/19/2018    History of present illness Jerry [name] is a 72yo male who comes to [facility] on 10/18 p 1W decreased appetite, lethargy, found to be septic from a psoas abscess. PTA pt has been at [facility] then SNF, reports unable to AMB past 9-37months, but was able to self propel in WC. Pt reports needing hoyer lift transfers from bed to [place].    OT comments  Pt slowly progressing towards OT goals. Upon arrival, pt awake and supine in bed with POA at bedside. Pt agreeable to bed-level exercises and declining ADLs at this time. Pt participating in 25 reps of BUEs/BLEs AROM/AAROM ROM. Pt becoming quickly frustrated during session and requiring increased encouragement for engagement. Continue to recommend dc to SNF and will continue to follow acutely as admitted.    Follow Up Recommendations  SNF;Supervision/Assistance - 24 hour    Equipment Recommendations  Other (comment)(defer to next venue of care)    Recommendations for Other Services      Precautions / Restrictions Precautions Precautions: Fall Precaution Comments: Left flank drains, suprapubic catheter.  Restrictions Weight Bearing Restrictions: No       Mobility Bed Mobility               General bed mobility comments: declined due to back pain  Transfers                 General transfer comment: declined    Balance                                           ADL either performed or assessed with clinical judgement   ADL Overall ADL's : Needs assistance/impaired   Eating/Feeding Details (indicate cue type and reason): Discussed self feeding positions and pt reporting he lays supine in bed with HOB elevated to 30 degrees                                   General ADL Comments: Declined ADLs at bedlevel stating he performed grooming this morning at 5AM. Agreeable to  bedlevel exericses.     Vision       Perception     Praxis      Cognition Arousal/Alertness: Awake/alert Behavior During Therapy: WFL for tasks assessed/performed(Irritable) Overall Cognitive Status: Within Functional Limits for tasks assessed                                 General Comments: Feel pt is at baseline cognition. However, high distractable by environment. Becoming quickly frustrated with therapist. Requiring increased encouragement for engagment. Agreeable to bedlevel exercises. Pt whispering to therapist to communicate and then speaking in normal volume to his POA (in room).        Exercises Exercises: General Upper Extremity;General Lower Extremity General Exercises - Upper Extremity Shoulder Flexion: AROM;Both;Other reps (comment);Supine(25 reps) Elbow Flexion: AROM;Other (comment);Supine;Both;Other reps (comment)(25 reps) Elbow Extension: AROM;Supine;Other (comment);Both;Other reps (comment)(25 reps) Wrist Flexion: AROM;Both;Other reps (comment);Supine(25 reps) Wrist Extension: AROM;Both;Other reps (comment);Supine(25 reps) Digit Composite Flexion: AROM;Both;Other reps (comment);Supine(25 reps) Composite Extension: AROM;Both;Supine(25 reps) General Exercises - Lower Extremity Ankle Circles/Pumps: Supine;AROM;AAROM;Right;Left;Other reps (comment)(25 reps) Short Arc Quad: (25 reps) Heel Slides: Both;Supine;PROM;AAROM;Other reps (  comment)(25 reps) Hip ABduction/ADduction: PROM;Both;Supine;AAROM;5 reps   Shoulder Instructions       General Comments POA in room    Pertinent Vitals/ Pain       Pain Assessment: Faces Faces Pain Scale: Hurts little more Pain Location: back Pain Descriptors / Indicators: Grimacing;Discomfort Pain Intervention(s): Monitored during session;Limited activity within patient's tolerance;Repositioned  Home Living                                          Prior Functioning/Environment               Frequency  Min 2X/week        Progress Toward Goals  OT Goals(current goals can now be found in the care plan section)  Progress towards OT goals: Progressing toward goals  Acute Rehab OT Goals Patient Stated Goal: none stated OT Goal Formulation: With patient Time For Goal Achievement: 08/26/18 Potential to Achieve Goals: Fair ADL Goals Pt Will Perform Grooming: with modified independence;bed level;sitting Pt Will Perform Upper Body Bathing: with min guard assist;bed level;sitting(supported sitting) Pt Will Perform Upper Body Dressing: with min assist;bed level;sitting Pt/caregiver will Perform Home Exercise Program: Increased strength;Both right and left upper extremity;With written HEP provided;With Supervision Additional ADL Goal #1: Pt will complete bed mobility with modA of one in preparation for ADL task EOB/OOB.  Plan Discharge plan remains appropriate    Co-evaluation                 AM-PAC PT "6 Clicks" Daily Activity     Outcome Measure   Help from another person eating meals?: A Little Help from another person taking care of personal grooming?: A Little Help from another person toileting, which includes using toliet, bedpan, or urinal?: Total Help from another person bathing (including washing, rinsing, drying)?: Total Help from another person to put on and taking off regular upper body clothing?: Total Help from another person to put on and taking off regular lower body clothing?: Total 6 Click Score: 10    End of Session    OT Visit Diagnosis: Muscle weakness (generalized) (M62.81)   Activity Tolerance Patient limited by fatigue;Patient limited by pain   Patient Left in bed;with call bell/phone within reach;with family/visitor present;with SCD's reapplied   Nurse Communication Mobility status        Time: 5784-6962 OT Time Calculation (min): 30 min  Charges: OT General Charges $OT Visit: 1 Visit OT Treatments $Therapeutic Activity:  23-37 mins  [place], OTR/L Acute Rehab Pager: [phone] Office: [place] 08/19/2018, 9:44 AM

## 2018-08-19 NOTE — Progress Notes (Signed)
Internal Medicine Attending:   I saw and examined the patient. I reviewed the resident's note and I agree with the resident's findings and plan as documented in the resident's note.  Principal Problem:   Retroperitoneal abscess ([place]) Active Problems:   Psoas abscess, left (HCC)   Malnutrition of moderate degree   Nephrolithiasis   Stable condition today.  Both left-sided JP drains have a small amount of purulence, output recorded as 12 mL daily.  Planning for tunneled central line today in order to provide extended course of IV antibiotics.  Both JP drains are to stay in place, flush daily, record output daily, and then follow-up in the radiology clinic in about 2 weeks for a drain study.  We are coordinating discharge to skilled nursing facility to continue with physical therapy and provide the IV antibiotics.  Lalla Brothers, MD

## 2018-08-19 NOTE — Progress Notes (Signed)
Patient ID: Jerry [name], male   DOB: 03-29-1946, 72 y.o.   MRN: [id number]   MD calls to report plan is to DC to SNF today or tomorrow.  Will DC with drains OP still significant  Will need to flush drains with 5-10 cc sterile saline daily Record OP daily  Orders in place for 2 week CT and drain injection at [facility]  Pt/or facility will hear from scheduler for time and date

## 2018-08-20 ENCOUNTER — Inpatient Hospital Stay: Payer: Self-pay

## 2018-08-20 DIAGNOSIS — L8990 Pressure ulcer of unspecified site, unspecified stage: Secondary | ICD-10-CM

## 2018-08-20 LAB — GLUCOSE, CAPILLARY: GLUCOSE-CAPILLARY: 75 mg/dL (ref 70–99)

## 2018-08-20 MED ORDER — AMOXICILLIN-POT CLAVULANATE 875-125 MG PO TABS: 1.0000 | ORAL_TABLET | Freq: Two times a day (BID) | ORAL | 0 refills | Status: DC

## 2018-08-20 MED ORDER — PIPERACILLIN-TAZOBACTAM 3.375 G IVPB: 3.3750 g | Freq: Three times a day (TID) | INTRAVENOUS | 0 refills | Status: DC

## 2018-08-20 NOTE — Progress Notes (Signed)
Patient unable to be D/C because nursing home staff [facility]) can't take care of tunneled catheter. MD made aware. Will continue to monitor.

## 2018-08-20 NOTE — Progress Notes (Signed)
PT Cancellation Note  Patient Details Name: Jerry [name] MRN: [id number] DOB: October 07, 1946   Cancelled Treatment:    Reason Eval/Treat Not Completed: Patient declined, no reason specified(Pt reports he is DC shortly to SNF and would like to conserve energy to better tolerate this transition. ) Pt asks for assistance with leg repositioning. Legs are floated and RLE is bolstered to better neutralized RLE toe-out.   10:59 AM, 08/20/18 Etta Grandchild, PT, DPT Physical Therapist - [place] [phone] (Pager)  [phone] (Office)       Mac Dowdell C 08/20/2018, 10:58 AM

## 2018-08-20 NOTE — Progress Notes (Signed)
2pm-CSW received call from [facility] stating to stop [facility] because they are unable to take care of a tunneled catheter. CSW contacted [facility] (they had taken the patient downstairs to the truck) and had them bring the patient back to his room. CSW contacting MD to determine next steps an if patient can have a PICC line instead. Patient's POA, [name], notified and returned to bedside.   Percell Locus Jourdan Durbin LCSW [phone]

## 2018-08-20 NOTE — Progress Notes (Signed)
Internal Medicine Attending:   I saw and examined the patient. I reviewed the resident's note and I agree with the resident's findings and plan as documented in the resident's note.  Principal Problem:   Retroperitoneal abscess ([place]) Active Problems:   Psoas abscess, left (HCC)   Malnutrition of moderate degree   Nephrolithiasis   Pressure injury of skin  Stable today. Working on disposition to SNF in order to continue with drain care and IV antibiotics for another month.   Lalla Brothers, MD

## 2018-08-20 NOTE — Progress Notes (Addendum)
Patient will DC to: [facility] Anticipated DC date: 08/20/18 Family notified: POA-Susan at bedside. She requested form to receive medical records. CSW provided form.  Transport by: Corey Harold   Per MD patient ready for DC to [facility]. RN, patient, patient's family, and facility notified of DC. Discharge Summary and FL2 sent to facility. RN to call report prior to discharge ([phone]). DC packet on chart. Ambulance transport requested for patient.   CSW will sign off for now as social work intervention is no longer needed. Please consult [place] again if new needs arise.  Cedric Fishman, LCSW Clinical Social Worker [phone]

## 2018-08-20 NOTE — Progress Notes (Addendum)
   Subjective: Mr. Wisehart reported feeling about the same this morning.  He is still feeling weak and having left-sided soreness at the site of the JP drains.  Continuing to have decreased appetite.  He understood the plans to discharge with JP drains and continue with IV antibiotics.  He expressed concerns of the infection spreading and hygiene at his skilled nursing facility.  Questions and concerns were addressed with him and POA.  Objective:  Vital signs in last 24 hours: Vitals:   08/19/18 0640 08/19/18 1252 08/19/18 2047 08/20/18 0610  BP: 107/73 124/68 (!) 100/58 104/60  Pulse: 68 77 72 74  Resp:  18 20 16   Temp: (!) 97.5 F (36.4 C) 98.6 F (37 C) 97.8 F (36.6 C) 97.6 F (36.4 C)  TempSrc: Oral  Oral Oral  SpO2: 97% 96% 94% 98%  Weight:    100.6 kg  Height:       Physical Exam  Constitutional: He is oriented to person, place, and time. No distress.  Neurological: He is alert and oriented to person, place, and time.  Skin: Skin is warm and dry. He is not diaphoretic.  JP drain output remains feculent in nature    Assessment/Plan:  Principal Problem:   Retroperitoneal abscess [facility]) Active Problems:   Psoas abscess, left (HCC)   Malnutrition of moderate degree   Nephrolithiasis   Pressure injury of skin  L Retroperitoneal and psoas abscess s/p IR drain placement 10/20 Drain output ~30 cc over the past 24 hours. Remains afebrile,hemodynamically stable. IR plans to follow up with patient in 2 weeks for CT and drain injection at [facility]. He is stable for discharge today with plans to switch to Zosyn at discharge for a total of 6 weeks of treatment, until December 1st, and then switching to p.o. Augmentin for an additional 1-2 months after completion of IV antibiotic therapy. -Appreciate IR and ID recommendations -Continue IV Unasyn day 14 - Drain output ~30 cc in the past 24 hours   Patient's facility cannot take care of tunneled catheter. Patient agreed to  PICC line placement. Discharge pending on PICC line placement.  Dispo: Patient is medically stable for discharge today.  Mike Craze, DO 08/20/2018, 11:44 AM Pager: [phone]

## 2018-08-20 NOTE — Progress Notes (Signed)
Patient was discharged to nursing home [facility]) by MD order; discharged instructions review and sent to facility with care notes; IV DIC; facility was called and report was given to the nurse who is going to receive the patient; Foley in place per MD order and JP drains in place, flushed before D/C; patient will be transported to facility via [place].

## 2018-08-21 ENCOUNTER — Encounter (HOSPITAL_COMMUNITY): Payer: Self-pay | Admitting: Radiology

## 2018-08-21 ENCOUNTER — Inpatient Hospital Stay: Payer: Self-pay

## 2018-08-21 ENCOUNTER — Inpatient Hospital Stay (HOSPITAL_COMMUNITY): Payer: Medicare Other

## 2018-08-21 DIAGNOSIS — R140 Abdominal distension (gaseous): Secondary | ICD-10-CM | POA: Diagnosis not present

## 2018-08-21 DIAGNOSIS — A498 Other bacterial infections of unspecified site: Secondary | ICD-10-CM | POA: Diagnosis not present

## 2018-08-21 DIAGNOSIS — R110 Nausea: Secondary | ICD-10-CM | POA: Diagnosis not present

## 2018-08-21 DIAGNOSIS — Z7401 Bed confinement status: Secondary | ICD-10-CM | POA: Diagnosis not present

## 2018-08-21 DIAGNOSIS — Z111 Encounter for screening for respiratory tuberculosis: Secondary | ICD-10-CM | POA: Diagnosis not present

## 2018-08-21 DIAGNOSIS — D649 Anemia, unspecified: Secondary | ICD-10-CM | POA: Diagnosis not present

## 2018-08-21 DIAGNOSIS — R21 Rash and other nonspecific skin eruption: Secondary | ICD-10-CM | POA: Diagnosis not present

## 2018-08-21 DIAGNOSIS — N179 Acute kidney failure, unspecified: Secondary | ICD-10-CM | POA: Diagnosis present

## 2018-08-21 DIAGNOSIS — E43 Unspecified severe protein-calorie malnutrition: Secondary | ICD-10-CM | POA: Diagnosis not present

## 2018-08-21 DIAGNOSIS — E669 Obesity, unspecified: Secondary | ICD-10-CM | POA: Diagnosis present

## 2018-08-21 DIAGNOSIS — G959 Disease of spinal cord, unspecified: Secondary | ICD-10-CM | POA: Diagnosis not present

## 2018-08-21 DIAGNOSIS — K6811 Postprocedural retroperitoneal abscess: Secondary | ICD-10-CM | POA: Diagnosis not present

## 2018-08-21 DIAGNOSIS — N1330 Unspecified hydronephrosis: Secondary | ICD-10-CM | POA: Diagnosis not present

## 2018-08-21 DIAGNOSIS — Z978 Presence of other specified devices: Secondary | ICD-10-CM | POA: Diagnosis not present

## 2018-08-21 DIAGNOSIS — Z6830 Body mass index (BMI) 30.0-30.9, adult: Secondary | ICD-10-CM | POA: Diagnosis not present

## 2018-08-21 DIAGNOSIS — E7800 Pure hypercholesterolemia, unspecified: Secondary | ICD-10-CM | POA: Diagnosis not present

## 2018-08-21 DIAGNOSIS — R682 Dry mouth, unspecified: Secondary | ICD-10-CM | POA: Diagnosis not present

## 2018-08-21 DIAGNOSIS — M6281 Muscle weakness (generalized): Secondary | ICD-10-CM | POA: Diagnosis not present

## 2018-08-21 DIAGNOSIS — E441 Mild protein-calorie malnutrition: Secondary | ICD-10-CM | POA: Diagnosis not present

## 2018-08-21 DIAGNOSIS — A4189 Other specified sepsis: Secondary | ICD-10-CM | POA: Diagnosis not present

## 2018-08-21 DIAGNOSIS — B952 Enterococcus as the cause of diseases classified elsewhere: Secondary | ICD-10-CM | POA: Diagnosis present

## 2018-08-21 DIAGNOSIS — Z905 Acquired absence of kidney: Secondary | ICD-10-CM | POA: Diagnosis not present

## 2018-08-21 DIAGNOSIS — L02211 Cutaneous abscess of abdominal wall: Secondary | ICD-10-CM | POA: Diagnosis not present

## 2018-08-21 DIAGNOSIS — D638 Anemia in other chronic diseases classified elsewhere: Secondary | ICD-10-CM | POA: Diagnosis present

## 2018-08-21 DIAGNOSIS — K6819 Other retroperitoneal abscess: Secondary | ICD-10-CM | POA: Diagnosis not present

## 2018-08-21 DIAGNOSIS — B379 Candidiasis, unspecified: Secondary | ICD-10-CM | POA: Diagnosis not present

## 2018-08-21 DIAGNOSIS — K529 Noninfective gastroenteritis and colitis, unspecified: Secondary | ICD-10-CM | POA: Diagnosis not present

## 2018-08-21 DIAGNOSIS — Z95828 Presence of other vascular implants and grafts: Secondary | ICD-10-CM

## 2018-08-21 DIAGNOSIS — B954 Other streptococcus as the cause of diseases classified elsewhere: Secondary | ICD-10-CM | POA: Diagnosis not present

## 2018-08-21 DIAGNOSIS — B965 Pseudomonas (aeruginosa) (mallei) (pseudomallei) as the cause of diseases classified elsewhere: Secondary | ICD-10-CM | POA: Diagnosis present

## 2018-08-21 DIAGNOSIS — E46 Unspecified protein-calorie malnutrition: Secondary | ICD-10-CM | POA: Diagnosis not present

## 2018-08-21 DIAGNOSIS — Z8546 Personal history of malignant neoplasm of prostate: Secondary | ICD-10-CM | POA: Diagnosis not present

## 2018-08-21 DIAGNOSIS — I9589 Other hypotension: Secondary | ICD-10-CM | POA: Diagnosis not present

## 2018-08-21 DIAGNOSIS — Z933 Colostomy status: Secondary | ICD-10-CM | POA: Diagnosis not present

## 2018-08-21 DIAGNOSIS — E876 Hypokalemia: Secondary | ICD-10-CM | POA: Diagnosis present

## 2018-08-21 DIAGNOSIS — B964 Proteus (mirabilis) (morganii) as the cause of diseases classified elsewhere: Secondary | ICD-10-CM | POA: Diagnosis not present

## 2018-08-21 DIAGNOSIS — N200 Calculus of kidney: Secondary | ICD-10-CM | POA: Diagnosis not present

## 2018-08-21 DIAGNOSIS — N390 Urinary tract infection, site not specified: Secondary | ICD-10-CM | POA: Diagnosis not present

## 2018-08-21 DIAGNOSIS — I5032 Chronic diastolic (congestive) heart failure: Secondary | ICD-10-CM | POA: Diagnosis present

## 2018-08-21 DIAGNOSIS — K651 Peritoneal abscess: Secondary | ICD-10-CM | POA: Diagnosis not present

## 2018-08-21 DIAGNOSIS — I110 Hypertensive heart disease with heart failure: Secondary | ICD-10-CM | POA: Diagnosis present

## 2018-08-21 DIAGNOSIS — R630 Anorexia: Secondary | ICD-10-CM | POA: Diagnosis not present

## 2018-08-21 DIAGNOSIS — E860 Dehydration: Secondary | ICD-10-CM | POA: Diagnosis not present

## 2018-08-21 DIAGNOSIS — R197 Diarrhea, unspecified: Secondary | ICD-10-CM | POA: Diagnosis not present

## 2018-08-21 DIAGNOSIS — K219 Gastro-esophageal reflux disease without esophagitis: Secondary | ICD-10-CM | POA: Diagnosis not present

## 2018-08-21 DIAGNOSIS — R600 Localized edema: Secondary | ICD-10-CM | POA: Diagnosis not present

## 2018-08-21 DIAGNOSIS — L259 Unspecified contact dermatitis, unspecified cause: Secondary | ICD-10-CM | POA: Diagnosis present

## 2018-08-21 DIAGNOSIS — B958 Unspecified staphylococcus as the cause of diseases classified elsewhere: Secondary | ICD-10-CM

## 2018-08-21 DIAGNOSIS — Z452 Encounter for adjustment and management of vascular access device: Secondary | ICD-10-CM | POA: Diagnosis not present

## 2018-08-21 DIAGNOSIS — M2550 Pain in unspecified joint: Secondary | ICD-10-CM | POA: Diagnosis not present

## 2018-08-21 DIAGNOSIS — I959 Hypotension, unspecified: Secondary | ICD-10-CM | POA: Diagnosis present

## 2018-08-21 DIAGNOSIS — N202 Calculus of kidney with calculus of ureter: Secondary | ICD-10-CM | POA: Diagnosis not present

## 2018-08-21 DIAGNOSIS — Z79899 Other long term (current) drug therapy: Secondary | ICD-10-CM | POA: Diagnosis not present

## 2018-08-21 DIAGNOSIS — K6812 Psoas muscle abscess: Secondary | ICD-10-CM | POA: Diagnosis not present

## 2018-08-21 DIAGNOSIS — A419 Sepsis, unspecified organism: Secondary | ICD-10-CM | POA: Diagnosis not present

## 2018-08-21 DIAGNOSIS — B961 Klebsiella pneumoniae [K. pneumoniae] as the cause of diseases classified elsewhere: Secondary | ICD-10-CM | POA: Diagnosis not present

## 2018-08-21 DIAGNOSIS — I2510 Atherosclerotic heart disease of native coronary artery without angina pectoris: Secondary | ICD-10-CM | POA: Diagnosis not present

## 2018-08-21 DIAGNOSIS — R5381 Other malaise: Secondary | ICD-10-CM | POA: Diagnosis not present

## 2018-08-21 DIAGNOSIS — R52 Pain, unspecified: Secondary | ICD-10-CM | POA: Diagnosis not present

## 2018-08-21 DIAGNOSIS — E872 Acidosis: Secondary | ICD-10-CM | POA: Diagnosis present

## 2018-08-21 DIAGNOSIS — A491 Streptococcal infection, unspecified site: Secondary | ICD-10-CM | POA: Diagnosis not present

## 2018-08-21 DIAGNOSIS — K5900 Constipation, unspecified: Secondary | ICD-10-CM | POA: Diagnosis not present

## 2018-08-21 DIAGNOSIS — N319 Neuromuscular dysfunction of bladder, unspecified: Secondary | ICD-10-CM | POA: Diagnosis not present

## 2018-08-21 DIAGNOSIS — R319 Hematuria, unspecified: Secondary | ICD-10-CM | POA: Diagnosis not present

## 2018-08-21 DIAGNOSIS — Z1621 Resistance to vancomycin: Secondary | ICD-10-CM | POA: Diagnosis present

## 2018-08-21 DIAGNOSIS — E871 Hypo-osmolality and hyponatremia: Secondary | ICD-10-CM | POA: Diagnosis not present

## 2018-08-21 DIAGNOSIS — G4733 Obstructive sleep apnea (adult) (pediatric): Secondary | ICD-10-CM | POA: Diagnosis present

## 2018-08-21 DIAGNOSIS — E440 Moderate protein-calorie malnutrition: Secondary | ICD-10-CM | POA: Diagnosis present

## 2018-08-21 HISTORY — PX: IR REMOVAL TUN CV CATH W/O FL: IMG2289

## 2018-08-21 LAB — GLUCOSE, CAPILLARY: GLUCOSE-CAPILLARY: 74 mg/dL (ref 70–99)

## 2018-08-21 MED ORDER — LIDOCAINE HCL 1 % IJ SOLN
INTRAMUSCULAR | Status: AC
  Filled 2018-08-21: qty 20

## 2018-08-21 MED ORDER — LIDOCAINE HCL 1 % IJ SOLN
INTRAMUSCULAR | Status: DC | PRN
  Administered 2018-08-21: 10 mL

## 2018-08-21 NOTE — Discharge Summary (Signed)
Name: Jerry [name] MRN: [id number] DOB: [birth year] 72 y.o. PCP: Aura Dials, MD  Date of Admission: 08/02/2018  6:20 PM Date of Discharge: 08/21/2018 Attending Physician: Axel Filler, *  Discharge Diagnosis: 1. Left retroperitoneal abscess 2. Malnutrition 3. Nephrolithiasis  4. Hematuria 5. Hyponatremia 6. Normocytic Anemia  Discharge Medications: Allergies as of 08/21/2018   No Known Allergies     Medication List    TAKE these medications   acetaminophen 325 MG tablet Commonly known as:  TYLENOL Take 650 mg by mouth 3 (three) times daily.   amoxicillin-clavulanate 875-125 MG tablet Commonly known as:  AUGMENTIN Take 1 tablet by mouth 2 (two) times daily. Start taking on:  09/16/2018   piperacillin-tazobactam 3.375 GM/50ML IVPB Commonly known as:  ZOSYN Inject 50 mLs (3.375 g total) into the vein every 8 (eight) hours for 26 days.   polyethylene glycol packet Commonly known as:  MIRALAX / GLYCOLAX Take 17 g by mouth 2 (two) times daily.   senna 8.6 MG Tabs tablet Commonly known as:  SENOKOT Take 2 tablets (17.2 mg total) by mouth at bedtime as needed for mild constipation or moderate constipation.   vitamin B-12 1000 MCG tablet Commonly known as:  CYANOCOBALAMIN Take 2 tablets (2,000 mcg total) by mouth daily.       Disposition and follow-up:   Mr.Lavance Bordley was discharged from [facility] in Stable condition.  At the hospital follow up visit please address:  1.  Left retroperitoneal abscess with IR drain placement- please assess drain output and continue to perform daily flushes with 5-10 cc of normal saline and daily output recordings. He is to follow up with [facility] in ~2 weeks.   2.  Labs / imaging needed at time of follow-up: none  3.  Pending labs/ test needing follow-up: none  Follow-up Appointments:  Contact information for follow-up providers    Corrie Mckusick, DO Follow up in 2 week(s).     Specialties:  Interventional Radiology, Radiology Why:  follow up with Dr Earleen Newport 2 weeks-- pt will hear from scheduler for time and date; call [phone] if needs Contact information: [address] [place] [address] [phone]            Contact information for after-discharge care    Destination    [facility]erred [facility] .   Service:  Skilled Nursing Contact information: [address] [place] Hospital Course by problem list: 1. Sepsis secondary to left retroperitoneal and psoas abscess -   Patient was recently seen by his PCP at the [facility] and a CT abdomen pelvis showed left retroperitoneal/psoas abscess measuring 6.9 x 8.8 x 17 cm in size and she asked him to come to the ED for further evaluation. Patient reported he had worsening weakness, appetite, subjective fevers, chills and increased lethargy. He was found to have sepsis secondary to large retroperitoneal abscess.  Also found to have a UTI growing Pseudomonas.  IR drained 800 cc of purulent fluid from left-sided retroperitoneal abscess and placed 2 drains.  Patient was started on cefepime, vancomycin and Flagyl.  Cultures from abscess showed proteus mirabilis, klebsiella pneum, viridans streptococcus and therapy was switched to Unasyn per ID. Urine culture showed staph, pseudomonas and VRE as well; likely colonization so it was decided to not treat UTI unless s/sx worsened. There was concern for a  colonic fistula as the possible source; drainage from the drains appeared feculent in nature. MRI of abdomen and showed no evidence of fistula with persistent large fluid collection. Repeat CT abdomen pelvis showed persistent large fluid collection with well positioned catheters. He was treated with 13 days of Unasyn and drain output remained significant. Plan to discharge with daily flushes of JP drains with 5-10 cc of saline and to record daily drain  output and follow up with [facility] radiology in 2 weeks for drain evaluation and repeat CT. He had a tunneled chest wall catheter placed on 11/4 for long term abx therapy. Switched to pip/tazo every 8 hours at discharge for a total of 6 weeks of treatment, until December 1st, then convert to amox/clav 875 mg bid for additional 1 month. Patient's facility reported they do not manage tunneled catheters. Patient agreed to PICC line placement. TCC was removed and PICC line placed by IR on 11/6.  2. Normocytic anemia- also noted to be anemic with hemoglobin of 7.9 on admission.  This decreased to 7 but patient denied blood transfusion at that time.  No signs or symptoms of active bleeding.  Repeat CBC showed hemoglobin to be 7.9 which remained stable; most likely dilutional 2/2 IVFs.  3. Malnutrition- patient had a decreased appetite since admission due to nausea however refused anti-nausea medications. Diet/Nutrition was consulted and added a few supplements to help patient's diet and nutritional status.    4. Hematuria/Nephrolithiasis- patient was found to have hematuria during his hospital stay. Urology was consulted for evaluation of suprapubic catheter and continued hematuria; suggested hematuria is likely related to bladder irritation from stent or SP tube. Recommended outpatient renal calculus extraction and stent removal once abscess has improved and resolved.   5. Hyponatremia- patient was noted to have mild hyponatremia most likely in the setting of decreased appetite. He was given IVF's with improvement.    Discharge Vitals:   BP 103/67 (BP Location: Right Arm)   Pulse 75   Temp 97.7 F (36.5 C)   Resp 18   Ht 5\' 11"  (1.803 m)   Wt 100.6 kg   SpO2 96%   BMI 30.93 kg/m   Pertinent Labs, Studies, and Procedures:   CBC Latest Ref Rng & Units 08/18/2018 08/15/2018 08/14/2018  WBC 4.0 - 10.5 K/uL 7.8 10.8(H) 11.7(H)  Hemoglobin 13.0 - 17.0 g/dL 8.5(L) 8.4(L) 8.3(L)  Hematocrit 39.0 -  52.0 % 29.2(L) 29.5(L) 29.2(L)  Platelets 150 - 400 K/uL 355 433(H) 406(H)   CMP Latest Ref Rng & Units 08/19/2018 08/18/2018 08/17/2018  Glucose 70 - 99 mg/dL 74 72 73  BUN 8 - 23 mg/dL 8 8 9   Creatinine 0.61 - 1.24 mg/dL 0.88 0.96 1.06  Sodium 135 - 145 mmol/L 136 135 132(L)  Potassium 3.5 - 5.1 mmol/L 4.0 3.3(L) 3.5  Chloride 98 - 111 mmol/L 108 104 103  CO2 22 - 32 mmol/L 25 25 25   Calcium 8.9 - 10.3 mg/dL 8.4(L) 8.4(L) 8.7(L)  Total Protein 6.5 - 8.1 g/dL - - -  Total Bilirubin 0.3 - 1.2 mg/dL - - -  Alkaline Phos 38 - 126 U/L - - -  AST 15 - 41 U/L - - -  ALT 0 - 44 U/L - - -     Ct Abdomen Pelvis Wo Contrast  Result Date: 08/04/2018 CLINICAL DATA:  72 year old male with a history of retroperitoneal abscess. EXAM: CT ABDOMEN AND PELVIS WITHOUT CONTRAST TECHNIQUE: Multidetector CT imaging of the abdomen and pelvis was performed  following the standard protocol without IV contrast. COMPARISON:  No prior CT. Plain film 08/03/2018, CT 02/23/2018, 02/23/2018, 01/01/2018 FINDINGS: Lower chest: Atelectasis of the inferior lungs. Coronary artery disease and valve calcifications. Hepatobiliary: Unremarkable appearance of the liver. Cholelithiasis. No pericholecystic fluid or inflammatory changes. Pancreas: Unremarkable Spleen: Unremarkable Adrenals/Urinary Tract: Unremarkable adrenal glands. Double-J right ureteral stent with the proximal loop formed in the collecting system and the distal loop formed in the distal ureter. Nephrolithiasis of the right collecting system measuring 15 mm, 10 mm, 15 mm. No perinephric stranding on the right. Suprapubic catheter within the urinary bladder with bladder decompressed. Surgical changes of left nephrectomy. Fluid and gas collection in the left retroperitoneal space, continuous with the psoas muscle medially and the lateral abdominal wall laterally measuring 111 mm by 75 mm on the axial images. Flocculent material at the superior aspect of the fluid collection.  The left colon is displaced anteriorly. Uncertain if there is a connection between left colon and the abscess cavity on this CT image. Flocculent material within the left-sided flank measures 138 mm with no definite connection to the retroperitoneal space. Stomach/Bowel: Hiatal hernia. Unremarkable stomach. Small bowel partially distended with air-fluid levels. No transition point. Moderate stool burden. No significantly distended colon. Colonic diverticular change without associated inflammatory changes. Appendix is not visualized, however, no inflammatory changes are present adjacent to the cecum to indicate an appendicitis. Vascular/Lymphatic: Calcifications of the abdominal aorta. No aneurysm. Calcifications of the iliac arteries. Reproductive: Unremarkable prostate. Other: None Musculoskeletal: Multilevel degenerative changes of the visualized thoracolumbar spine. No acute displaced fracture. Degenerative changes of bilateral hips. Surgical changes of thoracic laminectomy IMPRESSION: Fluid and gas collection of the left retroperitoneal space, potentially abscess, hematoma, or seroma/lymphocele. Status post left nephrectomy. Flocculent material in the left flank, concerning for abscess. Right-sided ureteral stent with the proximal loop formed in the collecting system and the distal loop formed in the distal ureter. Referral for follow-up urologic evaluation is recommended. Multiple nonobstructing stones of the right collecting system. Cholelithiasis without evidence of acute inflammatory changes. Diverticular disease without evidence of acute diverticulitis. Moderate stool burden with no evidence of obstruction. Aortic Atherosclerosis (ICD10-I70.0). Electronically Signed   By: Corrie Mckusick D.O.   On: 08/04/2018 08:37   Dg Abd 1 View  Result Date: 08/03/2018 CLINICAL DATA:  Chronic generalized abdominal pain. Ureteral stent displacement. EXAM: ABDOMEN - 1 VIEW COMPARISON:  02/26/2018 FINDINGS:  Nonobstructive/nonspecific bowel gas pattern. Paucity of gas in the left abdomen with gas collection overlying the left flank. Right-sided ureteral stent with proximal tip in the expected location of the right renal pelvis and distal tip in the right pelvis. IMPRESSION: Unchanged positioning of the right ureteral stent. Nonspecific bowel gas pattern. Gas collection overlies the left flank. Electronically Signed   By: Fidela Salisbury M.D.   On: 08/03/2018 17:34   Dg Chest Port 1 View  Result Date: 08/02/2018 CLINICAL DATA:  Abscess EXAM: PORTABLE CHEST 1 VIEW COMPARISON:  09/21/2017, 09/17/2017 FINDINGS: Low lung volumes. Elevation of the right diaphragm. No focal opacity or pleural effusion. Rounded enlargement of the right hilus. IMPRESSION: 1. Rounded enlargement of right hilus, could be secondary to elevated diaphragm and crowding of the central pulmonary vessels, however hilar nodes or mass are not excluded. Correlation with contrast-enhanced CT is suggested. 2. No focal pulmonary opacity. Electronically Signed   By: Donavan Foil M.D.   On: 08/02/2018 20:34   Discharge Instructions: Discharge Instructions    Diet - low sodium heart healthy  Complete by:  As directed    Diet - low sodium heart healthy   Complete by:  As directed    Discharge instructions   Complete by:  As directed    Mr. Angus,  Please take IV Zoysn every 8 hours until December 1st. Then switch to Augmentin, which is an oral medication, twice a day for 2 months.   [facility] will be in touch to schedule a follow up appointment to assess your drains and repeat a CT scan. We will want you to continue to flush drains with 5-10 cc sterile saline daily and record drain output daily.   Increase activity slowly   Complete by:  As directed    Increase activity slowly   Complete by:  As directed       Signed: Welford Roche, MD 08/21/2018, 1:45 PM   Pager: [phone]

## 2018-08-21 NOTE — Discharge Instructions (Signed)
Switch to Zosyn at discharge for a total of 6 weeks of treatment, until December 1st, and thenswitch to p.o. Augmentin for an additional 1 month after completion of IV antibiotic therapy.

## 2018-08-21 NOTE — Plan of Care (Signed)
  Problem: Clinical Measurements: Goal: Will remain free from infection 08/21/2018 1734 by Wilder Glade, RN Outcome: Completed/Met 08/21/2018 1733 by Wilder Glade, RN Outcome: Progressing   Problem: Activity: Goal: Risk for activity intolerance will decrease 08/21/2018 1734 by Wilder Glade, RN Outcome: Completed/Met 08/21/2018 1733 by Wilder Glade, RN Outcome: Progressing   Problem: Nutrition: Goal: Adequate nutrition will be maintained 08/21/2018 1734 by Wilder Glade, RN Outcome: Completed/Met 08/21/2018 1733 by Wilder Glade, RN Outcome: Progressing   Problem: Education: Goal: Ability to demonstrate management of disease process will improve 08/21/2018 1734 by Wilder Glade, RN Outcome: Completed/Met 08/21/2018 1733 by Wilder Glade, RN Outcome: Progressing Goal: Ability to verbalize understanding of medication therapies will improve 08/21/2018 1734 by Wilder Glade, RN Outcome: Completed/Met 08/21/2018 1733 by Wilder Glade, RN Outcome: Progressing Goal: Individualized Educational Video(s) 08/21/2018 1734 by Wilder Glade, RN Outcome: Completed/Met 08/21/2018 1733 by Wilder Glade, RN Outcome: Progressing   Problem: Activity: Goal: Capacity to carry out activities will improve 08/21/2018 1734 by Wilder Glade, RN Outcome: Completed/Met 08/21/2018 1733 by Wilder Glade, RN Outcome: Progressing   Problem: Cardiac: Goal: Ability to achieve and maintain adequate cardiopulmonary perfusion will improve 08/21/2018 1734 by Wilder Glade, RN Outcome: Completed/Met 08/21/2018 1733 by Wilder Glade, RN Outcome: Progressing

## 2018-08-21 NOTE — Procedures (Signed)
1. Successful removal of tunneled (R)IJ CVC  2. Successful placement of single lumen PICC line to right basilic vein. Length 40 cmcm Tip at lower SVC/RA No complications Ready for use.   Ascencion Dike PA-C 08/21/2018 1:30 PM

## 2018-08-21 NOTE — Progress Notes (Signed)
Pt was for discharge yesterday but SNF could not accept pt with tunneled CVC. Pt originally declined standard PICC due to hx of infection and inadvertent removal, but he is now agreeable to standard PICC otherwise he cannot DC to SNF. Have d/w PICC with pt and pt has consented. Will place PICC today and remove tunneled CVC  Ascencion Dike PA-C Interventional Radiology 08/21/2018 9:02 AM

## 2018-08-21 NOTE — Progress Notes (Addendum)
Patient alert with ex wife at bedside.  Due to transfer to [facility], attempt to give report x 3 to facility without success.  Patient in stable condition upon transfer via [facility] transport service.  Patient discharge at 1718.

## 2018-08-21 NOTE — Progress Notes (Addendum)
Patient will DC to: [facility] Anticipated DC date: 08/21/18 Family notified: POA, Programmer, applications by: Corey Harold (a few hours behind)  Please contact [name] when Schwana arrives.   Per MD patient ready for DC to [facility]. RN, patient, patient's family, and facility notified of DC. Discharge Summary and FL2 sent to facility. RN to call report prior to discharge ([phone]). DC packet on chart. Ambulance transport requested for patient.   CSW will sign off for now as social work intervention is no longer needed. Please consult [place] again if new needs arise.  Cedric Fishman, LCSW Clinical Social Worker [phone]

## 2018-08-21 NOTE — Progress Notes (Signed)
   Subjective: Mr. Meroney reported feeling the same today. He is still having left sided soreness at the drain site. Denies nausea, vomiting, abdominal pain.  Objective:  Vital signs in last 24 hours: Vitals:   08/20/18 0610 08/20/18 1525 08/20/18 2125 08/21/18 0509  BP: 104/60 100/64 104/64 103/67  Pulse: 74 77 75 75  Resp: 16 18 18    Temp: 97.6 F (36.4 C) 97.7 F (36.5 C) (!) 97.5 F (36.4 C) 97.7 F (36.5 C)  TempSrc: Oral  Oral   SpO2: 98% 95% 96% 96%  Weight: 100.6 kg     Height:       Physical Exam  Constitutional: He is oriented to person, place, and time and well-developed, well-nourished, and in no distress.  Neurological: He is alert and oriented to person, place, and time.  Skin:  JP drain output remains feculent in nature    Assessment/Plan:  Principal Problem:   Retroperitoneal abscess ([place]) Active Problems:   Psoas abscess, left (HCC)   Malnutrition of moderate degree   Nephrolithiasis   Pressure injury of skin  L Retroperitoneal and psoas abscess s/p IR drain placement 10/20 Patient was discharged yesterday; however, his facility called to inform they do not manage tunneled chest catheters. Spoke with supervising RN at [facility] who said her nursing staff has never worked with a tunneled catheter before and would be unable to manage this. IR to remove TCC and place PICC line. He is stable for discharge one this has been taken care of with plans to switch to Zosyn at discharge for a total of 6 weeks of treatment, until December 1st, and then switching to p.o. Augmentin for an additional 1-2 months after completion of IV antibiotic therapy. -Appreciate IR  recommendations -Continue IV Unasyn day 15 - Drain output ~24.5cc in the past 24 hours   Dispo: Anticipated discharge is today pending TCC removal and PICC line placement.  Jimmy Stipes N, DO 08/21/2018, 7:25 AM Pager: [phone]

## 2018-08-21 NOTE — Progress Notes (Signed)
Spoke to [employer] about my conversation yesterday with Levada Dy Case Management and Nurse, adult about having IR remove the current central line and IR to replace that line with a PICC line. Tiffany states she will update the order to reflect IR to place PICC. [employer]

## 2018-08-21 NOTE — Progress Notes (Signed)
Physical Therapy Treatment Patient Details Name: Jerry [name] MRN: [id number] DOB: 08/20/1946 Today's Date: 08/21/2018    History of Present Illness Jerry [name] is a 72yo male who comes to [facility] on 10/18 p 1W decreased appetite, lethargy, found to be septic from a psoas abscess. PTA pt has been at [facility] then SNF, reports unable to AMB past 9-15months, but was able to self propel in WC. Pt reports needing hoyer lift transfers from bed to [place].     PT Comments     Pt was asleep in bed upon arrival but was easily aroused. Pt stated he didn't want to participate in therapy today, but with encouragement he agreed to bed exercises. Pt was limited with LE exercised this session, stating that bending his knee hurt his back. He used a whispering voice throughout the session and became agitated easily. The [facility] was elevated from 17 degrees to 22 degrees in order for nursing to administer medicine to pt during session. Pt quickly requested the [facility] be lowered back down. Pt was limited by pain this session, but would benefit from cont'd PT in order to progress toward stated goals. Pt still seems appropriate for SNF placement based on current functional status.    Follow Up Recommendations  SNF;Supervision/Assistance - 24 hour;Home health PT     Equipment Recommendations  None recommended by PT    Recommendations for Other Services       Precautions / Restrictions Precautions Precautions: Fall Precaution Comments: Left flank drains, suprapubic catheter.  Restrictions Weight Bearing Restrictions: No    Mobility  Bed Mobility               General bed mobility comments: declined due to back pain  Transfers                    Ambulation/Gait                 Stairs             Wheelchair Mobility    Modified Rankin (Stroke Patients Only)       Balance                                            Cognition Arousal/Alertness:  Awake/alert Behavior During Therapy: WFL for tasks assessed/performed Overall Cognitive Status: Within Functional Limits for tasks assessed                                 General Comments: Feel pt is at baseline cognition. However, high distractable by environment. Becoming quickly frustrated with therapist when performing LE ex's, stating "it hurts my back".  Requiring increased encouragement for engagment. Agreeable to bedlevel exercises. Pt whispering to therapist to communicate and then speaking in normal volume when asked if he wanted his blanket back at end of session, stating he was freezing.      Exercises Total Joint Exercises Ankle Circles/Pumps: AROM;15 reps;Right;Left;Supine Quad Sets: AROM;10 reps;Right;Left;Supine Gluteal Sets: AROM;Right;Left;10 reps;Supine Heel Slides: 10 reps;PROM;Right;Left;Supine;Other (comment)(Pt c/o inreased back pain with flexion of knees) Hip ABduction/ADduction: AROM;5 reps;Right;Limitations;Other (comment) Hip Abduction/Adduction Limitations: deferred due to increase in back pain General Exercises - Upper Extremity Shoulder Flexion: AROM;Both;Supine;10 reps Shoulder Extension: AROM;Both;10 reps( pt stated manual resistance was aggravating ) Elbow Flexion: AROM;Supine;Both;10 reps Elbow Extension: AROM;Supine;Both;Other (comment);10 reps(Pt  reported maunal resistance was aggravating)    General Comments        Pertinent Vitals/Pain Pain Assessment: 0-10 Pain Score: 5  Pain Location: back Pain Descriptors / Indicators: Grimacing;Discomfort Pain Intervention(s): Limited activity within patient's tolerance;Monitored during session;Repositioned    Home Living                      Prior Function            PT Goals (current goals can now be found in the care plan section) Acute Rehab PT Goals Patient Stated Goal: none stated PT Goal Formulation: With patient Time For Goal Achievement: 08/26/18 Additional  Goals Additional Goal #1: Pt will tolerate semirecumbent posture in bed trunk to 70 degrees to allow for improved tolerance of mechnical lift to chair. Additional Goal #2: Pt will tolerate 20-109min bed level esercises to allow for better tolerance to bed mobility positioning for ADL completion with nursing and care givers.    Frequency    Min 2X/week      PT Plan Current plan remains appropriate    Co-evaluation              AM-PAC PT "6 Clicks" Daily Activity  Outcome Measure    Difficulty moving from lying on back to sitting on the side of the bed? : Unable Difficulty sitting down on and standing up from a chair with arms (e.g., wheelchair, bedside commode, etc,.)?: Unable Help needed moving to and from a bed to chair (including a wheelchair)?: Total Help needed walking in hospital room?: Total Help needed climbing 3-5 steps with a railing? : Total 6 Click Score: 5    End of Session Equipment Utilized During Treatment: Gait belt Activity Tolerance: Patient limited by pain Patient left: in bed;with call bell/phone within reach;Other (comment) Nurse Communication: Mobility status PT Visit Diagnosis: Muscle weakness (generalized) (M62.81);Difficulty in walking, not elsewhere classified (R26.2)     Time: 5409-8119 PT Time Calculation (min) (ACUTE ONLY): 14 min  Charges:  $Therapeutic Exercise: 8-22 mins                     [address], SPTA   [place] 08/21/2018, 1:53 PM

## 2018-08-22 DIAGNOSIS — K651 Peritoneal abscess: Secondary | ICD-10-CM | POA: Diagnosis not present

## 2018-08-22 DIAGNOSIS — D649 Anemia, unspecified: Secondary | ICD-10-CM | POA: Diagnosis not present

## 2018-08-22 DIAGNOSIS — N319 Neuromuscular dysfunction of bladder, unspecified: Secondary | ICD-10-CM | POA: Diagnosis not present

## 2018-08-22 DIAGNOSIS — K219 Gastro-esophageal reflux disease without esophagitis: Secondary | ICD-10-CM | POA: Diagnosis not present

## 2018-08-22 DIAGNOSIS — R630 Anorexia: Secondary | ICD-10-CM | POA: Diagnosis not present

## 2018-08-22 DIAGNOSIS — E860 Dehydration: Secondary | ICD-10-CM | POA: Diagnosis not present

## 2018-08-22 DIAGNOSIS — A4189 Other specified sepsis: Secondary | ICD-10-CM | POA: Diagnosis not present

## 2018-08-22 DIAGNOSIS — R52 Pain, unspecified: Secondary | ICD-10-CM | POA: Diagnosis not present

## 2018-08-22 DIAGNOSIS — E440 Moderate protein-calorie malnutrition: Secondary | ICD-10-CM | POA: Diagnosis not present

## 2018-08-22 DIAGNOSIS — N202 Calculus of kidney with calculus of ureter: Secondary | ICD-10-CM | POA: Diagnosis not present

## 2018-08-22 DIAGNOSIS — M6281 Muscle weakness (generalized): Secondary | ICD-10-CM | POA: Diagnosis not present

## 2018-08-22 DIAGNOSIS — R600 Localized edema: Secondary | ICD-10-CM | POA: Diagnosis not present

## 2018-08-27 ENCOUNTER — Other Ambulatory Visit (HOSPITAL_COMMUNITY): Payer: Self-pay | Admitting: Interventional Radiology

## 2018-08-27 DIAGNOSIS — R110 Nausea: Secondary | ICD-10-CM | POA: Diagnosis not present

## 2018-08-27 DIAGNOSIS — R197 Diarrhea, unspecified: Secondary | ICD-10-CM | POA: Diagnosis not present

## 2018-08-27 DIAGNOSIS — K6819 Other retroperitoneal abscess: Secondary | ICD-10-CM

## 2018-08-27 DIAGNOSIS — R630 Anorexia: Secondary | ICD-10-CM | POA: Diagnosis not present

## 2018-08-28 DIAGNOSIS — A4189 Other specified sepsis: Secondary | ICD-10-CM | POA: Diagnosis not present

## 2018-08-28 DIAGNOSIS — E860 Dehydration: Secondary | ICD-10-CM | POA: Diagnosis not present

## 2018-08-28 DIAGNOSIS — N319 Neuromuscular dysfunction of bladder, unspecified: Secondary | ICD-10-CM | POA: Diagnosis not present

## 2018-08-28 DIAGNOSIS — M6281 Muscle weakness (generalized): Secondary | ICD-10-CM | POA: Diagnosis not present

## 2018-08-28 DIAGNOSIS — R630 Anorexia: Secondary | ICD-10-CM | POA: Diagnosis not present

## 2018-08-28 DIAGNOSIS — N202 Calculus of kidney with calculus of ureter: Secondary | ICD-10-CM | POA: Diagnosis not present

## 2018-08-28 DIAGNOSIS — K219 Gastro-esophageal reflux disease without esophagitis: Secondary | ICD-10-CM | POA: Diagnosis not present

## 2018-08-28 DIAGNOSIS — R52 Pain, unspecified: Secondary | ICD-10-CM | POA: Diagnosis not present

## 2018-08-28 DIAGNOSIS — E440 Moderate protein-calorie malnutrition: Secondary | ICD-10-CM | POA: Diagnosis not present

## 2018-08-28 DIAGNOSIS — R600 Localized edema: Secondary | ICD-10-CM | POA: Diagnosis not present

## 2018-08-28 DIAGNOSIS — D649 Anemia, unspecified: Secondary | ICD-10-CM | POA: Diagnosis not present

## 2018-08-28 DIAGNOSIS — K651 Peritoneal abscess: Secondary | ICD-10-CM | POA: Diagnosis not present

## 2018-08-30 DIAGNOSIS — R630 Anorexia: Secondary | ICD-10-CM | POA: Diagnosis not present

## 2018-08-30 DIAGNOSIS — E860 Dehydration: Secondary | ICD-10-CM | POA: Diagnosis not present

## 2018-08-30 DIAGNOSIS — N202 Calculus of kidney with calculus of ureter: Secondary | ICD-10-CM | POA: Diagnosis not present

## 2018-08-30 DIAGNOSIS — D649 Anemia, unspecified: Secondary | ICD-10-CM | POA: Diagnosis not present

## 2018-08-30 DIAGNOSIS — A4189 Other specified sepsis: Secondary | ICD-10-CM | POA: Diagnosis not present

## 2018-08-30 DIAGNOSIS — N319 Neuromuscular dysfunction of bladder, unspecified: Secondary | ICD-10-CM | POA: Diagnosis not present

## 2018-08-30 DIAGNOSIS — E440 Moderate protein-calorie malnutrition: Secondary | ICD-10-CM | POA: Diagnosis not present

## 2018-08-30 DIAGNOSIS — K651 Peritoneal abscess: Secondary | ICD-10-CM | POA: Diagnosis not present

## 2018-08-30 DIAGNOSIS — M6281 Muscle weakness (generalized): Secondary | ICD-10-CM | POA: Diagnosis not present

## 2018-08-30 DIAGNOSIS — R600 Localized edema: Secondary | ICD-10-CM | POA: Diagnosis not present

## 2018-08-30 DIAGNOSIS — R52 Pain, unspecified: Secondary | ICD-10-CM | POA: Diagnosis not present

## 2018-08-30 DIAGNOSIS — K219 Gastro-esophageal reflux disease without esophagitis: Secondary | ICD-10-CM | POA: Diagnosis not present

## 2018-09-02 DIAGNOSIS — M6281 Muscle weakness (generalized): Secondary | ICD-10-CM | POA: Diagnosis not present

## 2018-09-02 DIAGNOSIS — K219 Gastro-esophageal reflux disease without esophagitis: Secondary | ICD-10-CM | POA: Diagnosis not present

## 2018-09-02 DIAGNOSIS — D649 Anemia, unspecified: Secondary | ICD-10-CM | POA: Diagnosis not present

## 2018-09-02 DIAGNOSIS — E860 Dehydration: Secondary | ICD-10-CM | POA: Diagnosis not present

## 2018-09-02 DIAGNOSIS — R600 Localized edema: Secondary | ICD-10-CM | POA: Diagnosis not present

## 2018-09-02 DIAGNOSIS — N319 Neuromuscular dysfunction of bladder, unspecified: Secondary | ICD-10-CM | POA: Diagnosis not present

## 2018-09-02 DIAGNOSIS — N202 Calculus of kidney with calculus of ureter: Secondary | ICD-10-CM | POA: Diagnosis not present

## 2018-09-02 DIAGNOSIS — R52 Pain, unspecified: Secondary | ICD-10-CM | POA: Diagnosis not present

## 2018-09-02 DIAGNOSIS — A4189 Other specified sepsis: Secondary | ICD-10-CM | POA: Diagnosis not present

## 2018-09-02 DIAGNOSIS — K651 Peritoneal abscess: Secondary | ICD-10-CM | POA: Diagnosis not present

## 2018-09-02 DIAGNOSIS — R630 Anorexia: Secondary | ICD-10-CM | POA: Diagnosis not present

## 2018-09-02 DIAGNOSIS — E440 Moderate protein-calorie malnutrition: Secondary | ICD-10-CM | POA: Diagnosis not present

## 2018-09-03 ENCOUNTER — Other Ambulatory Visit (HOSPITAL_COMMUNITY): Payer: Self-pay | Admitting: Interventional Radiology

## 2018-09-03 ENCOUNTER — Telehealth (HOSPITAL_COMMUNITY): Payer: Self-pay | Admitting: Radiology

## 2018-09-03 DIAGNOSIS — K6819 Other retroperitoneal abscess: Secondary | ICD-10-CM

## 2018-09-03 NOTE — Telephone Encounter (Signed)
Called [facility] to schedule CT and drain injection. Left message on the Wellness phone. JM

## 2018-09-04 ENCOUNTER — Encounter (HOSPITAL_COMMUNITY): Payer: Self-pay

## 2018-09-04 ENCOUNTER — Emergency Department (HOSPITAL_COMMUNITY): Payer: Medicare Other

## 2018-09-04 ENCOUNTER — Telehealth (HOSPITAL_COMMUNITY): Payer: Self-pay | Admitting: Radiology

## 2018-09-04 ENCOUNTER — Other Ambulatory Visit: Payer: Self-pay

## 2018-09-04 ENCOUNTER — Inpatient Hospital Stay (HOSPITAL_COMMUNITY)
Admission: EM | Admit: 2018-09-04 | Discharge: 2018-09-10 | DRG: 372 | Disposition: A | Discharge status: Skilled Nursing Facility | Payer: Medicare Other | Attending: Internal Medicine | Admitting: Internal Medicine

## 2018-09-04 DIAGNOSIS — R609 Edema, unspecified: Secondary | ICD-10-CM | POA: Diagnosis not present

## 2018-09-04 DIAGNOSIS — K529 Noninfective gastroenteritis and colitis, unspecified: Secondary | ICD-10-CM | POA: Diagnosis not present

## 2018-09-04 DIAGNOSIS — N1330 Unspecified hydronephrosis: Secondary | ICD-10-CM | POA: Diagnosis not present

## 2018-09-04 DIAGNOSIS — Z8546 Personal history of malignant neoplasm of prostate: Secondary | ICD-10-CM

## 2018-09-04 DIAGNOSIS — B952 Enterococcus as the cause of diseases classified elsewhere: Secondary | ICD-10-CM | POA: Diagnosis present

## 2018-09-04 DIAGNOSIS — I959 Hypotension, unspecified: Secondary | ICD-10-CM | POA: Diagnosis present

## 2018-09-04 DIAGNOSIS — N179 Acute kidney failure, unspecified: Secondary | ICD-10-CM | POA: Diagnosis not present

## 2018-09-04 DIAGNOSIS — A419 Sepsis, unspecified organism: Secondary | ICD-10-CM | POA: Diagnosis not present

## 2018-09-04 DIAGNOSIS — Z933 Colostomy status: Secondary | ICD-10-CM | POA: Diagnosis not present

## 2018-09-04 DIAGNOSIS — E440 Moderate protein-calorie malnutrition: Secondary | ICD-10-CM | POA: Diagnosis present

## 2018-09-04 DIAGNOSIS — T85698A Other mechanical complication of other specified internal prosthetic devices, implants and grafts, initial encounter: Secondary | ICD-10-CM | POA: Diagnosis not present

## 2018-09-04 DIAGNOSIS — A491 Streptococcal infection, unspecified site: Secondary | ICD-10-CM

## 2018-09-04 DIAGNOSIS — E669 Obesity, unspecified: Secondary | ICD-10-CM | POA: Diagnosis present

## 2018-09-04 DIAGNOSIS — E876 Hypokalemia: Secondary | ICD-10-CM | POA: Diagnosis present

## 2018-09-04 DIAGNOSIS — G959 Disease of spinal cord, unspecified: Secondary | ICD-10-CM | POA: Diagnosis not present

## 2018-09-04 DIAGNOSIS — Z6830 Body mass index (BMI) 30.0-30.9, adult: Secondary | ICD-10-CM | POA: Diagnosis not present

## 2018-09-04 DIAGNOSIS — E860 Dehydration: Secondary | ICD-10-CM | POA: Diagnosis not present

## 2018-09-04 DIAGNOSIS — R140 Abdominal distension (gaseous): Secondary | ICD-10-CM | POA: Diagnosis not present

## 2018-09-04 DIAGNOSIS — I5032 Chronic diastolic (congestive) heart failure: Secondary | ICD-10-CM | POA: Diagnosis not present

## 2018-09-04 DIAGNOSIS — R110 Nausea: Secondary | ICD-10-CM | POA: Diagnosis not present

## 2018-09-04 DIAGNOSIS — I110 Hypertensive heart disease with heart failure: Secondary | ICD-10-CM | POA: Diagnosis present

## 2018-09-04 DIAGNOSIS — L259 Unspecified contact dermatitis, unspecified cause: Secondary | ICD-10-CM | POA: Diagnosis present

## 2018-09-04 DIAGNOSIS — N200 Calculus of kidney: Secondary | ICD-10-CM | POA: Diagnosis not present

## 2018-09-04 DIAGNOSIS — L0291 Cutaneous abscess, unspecified: Secondary | ICD-10-CM | POA: Diagnosis not present

## 2018-09-04 DIAGNOSIS — Z9119 Patient's noncompliance with other medical treatment and regimen: Secondary | ICD-10-CM | POA: Diagnosis not present

## 2018-09-04 DIAGNOSIS — Z7401 Bed confinement status: Secondary | ICD-10-CM | POA: Diagnosis not present

## 2018-09-04 DIAGNOSIS — K5900 Constipation, unspecified: Secondary | ICD-10-CM | POA: Diagnosis not present

## 2018-09-04 DIAGNOSIS — R197 Diarrhea, unspecified: Secondary | ICD-10-CM | POA: Diagnosis present

## 2018-09-04 DIAGNOSIS — Z1621 Resistance to vancomycin: Secondary | ICD-10-CM | POA: Diagnosis present

## 2018-09-04 DIAGNOSIS — G4733 Obstructive sleep apnea (adult) (pediatric): Secondary | ICD-10-CM | POA: Diagnosis present

## 2018-09-04 DIAGNOSIS — N319 Neuromuscular dysfunction of bladder, unspecified: Secondary | ICD-10-CM | POA: Diagnosis present

## 2018-09-04 DIAGNOSIS — M6281 Muscle weakness (generalized): Secondary | ICD-10-CM | POA: Diagnosis not present

## 2018-09-04 DIAGNOSIS — R630 Anorexia: Secondary | ICD-10-CM | POA: Diagnosis not present

## 2018-09-04 DIAGNOSIS — E46 Unspecified protein-calorie malnutrition: Secondary | ICD-10-CM | POA: Diagnosis not present

## 2018-09-04 DIAGNOSIS — K219 Gastro-esophageal reflux disease without esophagitis: Secondary | ICD-10-CM | POA: Diagnosis not present

## 2018-09-04 DIAGNOSIS — E569 Vitamin deficiency, unspecified: Secondary | ICD-10-CM | POA: Diagnosis not present

## 2018-09-04 DIAGNOSIS — I9589 Other hypotension: Secondary | ICD-10-CM | POA: Diagnosis not present

## 2018-09-04 DIAGNOSIS — E872 Acidosis: Secondary | ICD-10-CM | POA: Diagnosis present

## 2018-09-04 DIAGNOSIS — N202 Calculus of kidney with calculus of ureter: Secondary | ICD-10-CM | POA: Diagnosis not present

## 2018-09-04 DIAGNOSIS — Z978 Presence of other specified devices: Secondary | ICD-10-CM | POA: Diagnosis not present

## 2018-09-04 DIAGNOSIS — B965 Pseudomonas (aeruginosa) (mallei) (pseudomallei) as the cause of diseases classified elsewhere: Secondary | ICD-10-CM | POA: Diagnosis present

## 2018-09-04 DIAGNOSIS — K8020 Calculus of gallbladder without cholecystitis without obstruction: Secondary | ICD-10-CM | POA: Diagnosis not present

## 2018-09-04 DIAGNOSIS — K6819 Other retroperitoneal abscess: Secondary | ICD-10-CM | POA: Diagnosis present

## 2018-09-04 DIAGNOSIS — K651 Peritoneal abscess: Secondary | ICD-10-CM | POA: Diagnosis not present

## 2018-09-04 DIAGNOSIS — Z79899 Other long term (current) drug therapy: Secondary | ICD-10-CM | POA: Diagnosis not present

## 2018-09-04 DIAGNOSIS — R52 Pain, unspecified: Secondary | ICD-10-CM | POA: Diagnosis not present

## 2018-09-04 DIAGNOSIS — A498 Other bacterial infections of unspecified site: Secondary | ICD-10-CM

## 2018-09-04 DIAGNOSIS — K6811 Postprocedural retroperitoneal abscess: Secondary | ICD-10-CM | POA: Diagnosis not present

## 2018-09-04 DIAGNOSIS — R600 Localized edema: Secondary | ICD-10-CM | POA: Diagnosis not present

## 2018-09-04 DIAGNOSIS — D649 Anemia, unspecified: Secondary | ICD-10-CM | POA: Diagnosis not present

## 2018-09-04 DIAGNOSIS — K6812 Psoas muscle abscess: Secondary | ICD-10-CM

## 2018-09-04 DIAGNOSIS — M2550 Pain in unspecified joint: Secondary | ICD-10-CM | POA: Diagnosis not present

## 2018-09-04 DIAGNOSIS — Z905 Acquired absence of kidney: Secondary | ICD-10-CM | POA: Diagnosis not present

## 2018-09-04 DIAGNOSIS — D638 Anemia in other chronic diseases classified elsewhere: Secondary | ICD-10-CM | POA: Diagnosis present

## 2018-09-04 DIAGNOSIS — R21 Rash and other nonspecific skin eruption: Secondary | ICD-10-CM | POA: Diagnosis not present

## 2018-09-04 DIAGNOSIS — K2970 Gastritis, unspecified, without bleeding: Secondary | ICD-10-CM | POA: Diagnosis not present

## 2018-09-04 DIAGNOSIS — L02211 Cutaneous abscess of abdominal wall: Secondary | ICD-10-CM | POA: Diagnosis not present

## 2018-09-04 DIAGNOSIS — A4189 Other specified sepsis: Secondary | ICD-10-CM | POA: Diagnosis not present

## 2018-09-04 LAB — CBC WITH DIFFERENTIAL/PLATELET
ABS IMMATURE GRANULOCYTES: 0.04 10*3/uL (ref 0.00–0.07)
Basophils Absolute: 0 10*3/uL (ref 0.0–0.1)
Basophils Relative: 0 %
Eosinophils Absolute: 0.2 10*3/uL (ref 0.0–0.5)
Eosinophils Relative: 2 %
HEMATOCRIT: 30.5 % — AB (ref 39.0–52.0)
HEMOGLOBIN: 9 g/dL — AB (ref 13.0–17.0)
Immature Granulocytes: 0 %
LYMPHS PCT: 10 %
Lymphs Abs: 1.1 10*3/uL (ref 0.7–4.0)
MCH: 27.7 pg (ref 26.0–34.0)
MCHC: 29.5 g/dL — ABNORMAL LOW (ref 30.0–36.0)
MCV: 93.8 fL (ref 80.0–100.0)
Monocytes Absolute: 0.9 10*3/uL (ref 0.1–1.0)
Monocytes Relative: 9 %
NEUTROS ABS: 7.9 10*3/uL — AB (ref 1.7–7.7)
NEUTROS PCT: 79 %
NRBC: 0 % (ref 0.0–0.2)
Platelets: 243 10*3/uL (ref 150–400)
RBC: 3.25 MIL/uL — AB (ref 4.22–5.81)
RDW: 17.7 % — ABNORMAL HIGH (ref 11.5–15.5)
WBC: 10.2 10*3/uL (ref 4.0–10.5)

## 2018-09-04 LAB — URINALYSIS, ROUTINE W REFLEX MICROSCOPIC
Bilirubin Urine: NEGATIVE
Glucose, UA: NEGATIVE mg/dL
KETONES UR: NEGATIVE mg/dL
Nitrite: NEGATIVE
PROTEIN: NEGATIVE mg/dL
Specific Gravity, Urine: 1.009 (ref 1.005–1.030)
pH: 6 (ref 5.0–8.0)

## 2018-09-04 LAB — COMPREHENSIVE METABOLIC PANEL
ALT: 8 U/L (ref 0–44)
ANION GAP: 6 (ref 5–15)
AST: 17 U/L (ref 15–41)
Albumin: 1.3 g/dL — ABNORMAL LOW (ref 3.5–5.0)
Alkaline Phosphatase: 84 U/L (ref 38–126)
BUN: 10 mg/dL (ref 8–23)
CO2: 26 mmol/L (ref 22–32)
CREATININE: 1.37 mg/dL — AB (ref 0.61–1.24)
Calcium: 9.2 mg/dL (ref 8.9–10.3)
Chloride: 106 mmol/L (ref 98–111)
GFR calc non Af Amer: 49 mL/min — ABNORMAL LOW (ref >60)
GFR, EST AFRICAN AMERICAN: 57 mL/min — AB (ref >60)
Glucose, Bld: 119 mg/dL — ABNORMAL HIGH (ref 70–99)
Potassium: 3 mmol/L — ABNORMAL LOW (ref 3.5–5.1)
Sodium: 138 mmol/L (ref 135–145)
Total Bilirubin: 0.3 mg/dL (ref 0.3–1.2)
Total Protein: 5.8 g/dL — ABNORMAL LOW (ref 6.5–8.1)

## 2018-09-04 LAB — I-STAT CG4 LACTIC ACID, ED
LACTIC ACID, VENOUS: 2.12 mmol/L — AB (ref 0.5–1.9)
Lactic Acid, Venous: 1.63 mmol/L (ref 0.5–1.9)

## 2018-09-04 LAB — LIPASE, BLOOD: Lipase: 27 U/L (ref 11–51)

## 2018-09-04 MED ORDER — PIPERACILLIN-TAZOBACTAM 3.375 G IVPB: 3.3750 g | Freq: Three times a day (TID) | INTRAVENOUS | Status: DC

## 2018-09-04 MED ORDER — ONDANSETRON HCL 4 MG PO TABS: 4.0000 mg | ORAL_TABLET | Freq: Four times a day (QID) | ORAL | Status: DC | PRN

## 2018-09-04 MED ORDER — POTASSIUM CHLORIDE CRYS ER 20 MEQ PO TBCR
40.0000 meq | EXTENDED_RELEASE_TABLET | Freq: Two times a day (BID) | ORAL | Status: AC
  Administered 2018-09-04 – 2018-09-05 (×2): 40 meq via ORAL
  Filled 2018-09-04 (×2): qty 2

## 2018-09-04 MED ORDER — LACTATED RINGERS IV BOLUS
500.0000 mL | Freq: Once | INTRAVENOUS | Status: AC
  Administered 2018-09-04: 500 mL via INTRAVENOUS

## 2018-09-04 MED ORDER — SENNA 8.6 MG PO TABS: 2.0000 | ORAL_TABLET | Freq: Every evening | ORAL | Status: DC | PRN

## 2018-09-04 MED ORDER — PIPERACILLIN-TAZOBACTAM 3.375 G IVPB
3.3750 g | Freq: Three times a day (TID) | INTRAVENOUS | Status: DC
  Administered 2018-09-04 – 2018-09-10 (×18): 3.375 g via INTRAVENOUS
  Filled 2018-09-04 (×19): qty 50

## 2018-09-04 MED ORDER — ONDANSETRON HCL 4 MG/2ML IJ SOLN: 4.0000 mg | Freq: Four times a day (QID) | INTRAMUSCULAR | Status: DC | PRN

## 2018-09-04 MED ORDER — POLYETHYLENE GLYCOL 3350 17 G PO PACK: 17.0000 g | PACK | Freq: Two times a day (BID) | ORAL | Status: DC

## 2018-09-04 MED ORDER — ACETAMINOPHEN 325 MG PO TABS
650.0000 mg | ORAL_TABLET | Freq: Four times a day (QID) | ORAL | Status: DC | PRN
  Filled 2018-09-04: qty 2

## 2018-09-04 MED ORDER — VITAMIN B-12 1000 MCG PO TABS
2000.0000 ug | ORAL_TABLET | Freq: Every day | ORAL | Status: DC
  Administered 2018-09-05 – 2018-09-10 (×6): 2000 ug via ORAL
  Filled 2018-09-04 (×6): qty 2

## 2018-09-04 MED ORDER — ACETAMINOPHEN 650 MG RE SUPP
650.0000 mg | Freq: Four times a day (QID) | RECTAL | Status: DC | PRN
  Administered 2018-09-05: 650 mg via RECTAL

## 2018-09-04 NOTE — ED Notes (Signed)
IR provider at bedside

## 2018-09-04 NOTE — Progress Notes (Addendum)
Patient ID: Tyress Loden, male   DOB: [birth year], 71 y.o.   MRN: [id number]   Known to IR Left nephrectomy 02/21/18  Fluid and gas collection of the left retroperitoneal space, potentially abscess, hematoma, or seroma/lymphocele and flocculent material in the left flank, concerning for abscess. Left retroperitoneal abscess Left flank abscess  CT Drains 08/04/18: IMPRESSION: Status post CT-guided drainage of left retroperitoneal abscess with a 12 French drain as well as a left-sided flank abscess with a 12 French drain  Discharged to SNF with both drains intact 08/20/18 Instructions to RN fore flushing and OP recording   Returns to ED today with left flank pain  CT Today IMPRESSION: 1. Left retroperitoneum and left flank subcutaneous fluid collections have increased in size from 08/15/2018, despite well-positioned drainage catheters. The subcutaneous collection has layering fat density, is the drainage chylous? 2. Stable right hydronephrosis with indwelling ureteral stent. The lower retention loop is still formed at the level of the lower Ureter. Exam:  OP from one drain is purulent  Other drain is bloody (pt states he is unsure if SNF flushing or recording-- he feels little OP as far as he knows) Sites are without sign of infection + reddened cellulitis at flank near drains  Reviewed imaging with Dr Anselm Pancoast Rec: Surgical consult  I spoke to Dr Ronnald Nian He is admitting pt and consulting Surgery now Call IR if needed

## 2018-09-04 NOTE — H&P (Addendum)
Date: 09/04/2018               Patient Name:  Jerry [name] MRN: [id number]  DOB: 17-Sep-1946 Age / Sex: 72 y.o., male   PCP: Aura Dials, MD         Medical Service: Internal Medicine Teaching Service         Attending Physician: Dr. Aldine Contes, MD    First Contact: Dr. Nita Sickle MD Pager: [phone]  Second Contact: Dr. Ina Homes Pager: [phone]       After Hours (After 5p/  First Contact Pager: [phone]  weekends / holidays): Second Contact Pager: [phone]   Chief Complaint: Flank pain  History of Present Illness: Jerry [name] is a 72 year old male with OSA, chronic diastolic congestive heart failure, and hypertension who presented with flank pain.  Patient was recently admitted for sepsis 2/2 left retroperitoneal/psoas abscess. Also found to have a UTI growing Pseudomonas.  IR drained 800 cc of purulent fluid from left-sided retroperitoneal abscess and placed 2 drains. Patient was started on cefepime, vancomycin and Flagyl.  Cultures from abscess showed proteus mirabilis, klebsiella pneum, viridans streptococcus and therapy was switched to Unasyn per ID. Urine culture showed staph, pseudomonas and VRE as well; likely colonization so it was decided to not treat UTI unless s/sx worsened.   There was concern for a colonic fistula as the possible source; drainage from the drains appeared feculent in nature. MRI of abdomen showed no evidence of fistula with persistent large fluid collection. Repeat CT abdomen pelvis showed persistent large fluid collection with well positioned catheters. He had a PICC line placed and was discharged on pip/tazo. Plan was for his to finish 6 weeks of pip/tazo and transition to a month of amox/clav.  He states that since discharge he has had persistent left flank pain.  He also states that the nursing facility where he lives voiced concerned about his JP drains not adequately draining so he was sent to the ED for evaluation.  He reports  feeling more fatigued and sleepy throughout the last several months.  He also endorses lack of appetite over the last several weeks.  He denies fevers, shortness of breath, chest pain, hematuria.  In the ED, he was found to be hypotensive with SBP between 90-100 with otherwise normal vital signs.  CBC remarkable for normocytic anemia hemoglobin 9.0 with elevated RDW.  White blood cell increased from 2 weeks ago but still within normal range (7.8->10.2). He was also found a hypokalemia of 3.0, elevated creatinine of 1.37 (baseline 0.8-1.1), with low albumin of 1.3.  Urinalysis significant for hematuria.  Lactic acidosis of 2.12 which is now down trended to 1.63 (WNL). CT showed left retroperitoneal and left flank subcutaneous fluid collections which have increased in size from previous CT on 10/31.  The drainage catheters were noted to be well-positioned.  There was also a stable right hydronephrosis with indwelling ureter ureteral event.  Meds: No outpatient medications have been marked as taking for the 09/04/18 encounter [facility] Encounter).     Allergies: Allergies as of 09/04/2018  . (No Known Allergies)   Past Medical History:  Diagnosis Date  . Acute kidney injury ([place]) 09/21/2017  . Arthritis    "hands, knees, back, legs" (06/10/2014)  . Atonic neurogenic bladder 06/30/2016  . Bilateral leg weakness 10/25/2012  . Cervical myelopathy ([place])   . Chronic diastolic congestive heart failure ([place]) 06/23/2014  . Coma ([place]) 1968   "for 2 wks"  . Fall at  home 10/25/2012  . Generalized weakness 06/23/2014  . SWNIOEVO(350.0)    "monthly" (06/10/2014)  . Lower urinary tract infectious disease 10/25/2012  . Malaria   . Obesity 10/25/2012  . Presence of suprapubic catheter ([place]) 06/17/2014   Due to prostate cancer   . Prostate cancer ([place])   . PTSD (post-traumatic stress disorder)    "I'm all over that now"  . Rhabdomyolysis 09/21/2017  . Sleep apnea 10/25/2012  . Staghorn calculus 06/30/2016  .  Staphylococcus aureus bacteremia 06/30/2014  . Urinary incontinence, functional 10/25/2012  . UTI (urinary tract infection) 09/2017    Family History:  Family History  Problem Relation Age of Onset  . Heart disease Mother   . Dementia Mother   . Heart disease Father   . Dementia Father   . Alcohol abuse Father     Social History: Denies alcohol, tobacco, illicit drug use.  Review of Systems: A complete ROS was negative except as per HPI.  Physical Exam: Blood pressure (!) 99/46, pulse 62, temperature 97.7 F (36.5 C), temperature source Oral, resp. rate 16, height 5\' 11"  (1.803 m), weight 100.6 kg, SpO2 100 %.  Physical Exam  Constitutional: He is oriented to person, place, and time.  Elderly obese male lying in bed in no acute distress.  HENT:  Head: Normocephalic and atraumatic.  Eyes: Conjunctivae and EOM are normal.  Neck: Normal range of motion. Neck supple.  Cardiovascular: Normal rate and regular rhythm.  Distant heart sounds heard.  Pulmonary/Chest: Effort normal.  Auscultated lungs anteriorly.  No abnormal breath sounds heard.  Abdominal: Soft. Bowel sounds are normal. He exhibits no distension. There is no tenderness.  Musculoskeletal: He exhibits no edema or tenderness.  Left lower posterior flank with 2 percutaneous drains in place.  More medially placed drain seems to be falling out of the skin.  Purulent material in both drains.  Lateral to the drains is a approximately 5 cm x 6 cm area of induration and erythema.  Neurological: He is alert and oriented to person, place, and time.  Skin: Skin is warm and dry.  Psychiatric: He has a normal mood and affect. His behavior is normal.   CT Abd/Pelvis:  IMPRESSION: 1. Left retroperitoneum and left flank subcutaneous fluid collections have increased in size from 08/15/2018, despite well-positioned drainage catheters. The subcutaneous collection has layering fat density, is the drainage chylous? 2. Stable right  hydronephrosis with indwelling ureteral stent. The lower retention loop is still formed at the level of the lower ureter.  Assessment & Plan by Problem: Active Problems:   Psoas abscess, left [facility])  Mr. Fitzhenry presents with worsening left retroperitoneal and left flank subcutaneous abscesses in the setting of an inadequate JP line drainage.  IR consulted and does not believe that further IR procedures are beneficial.  Surgery consulted and recommends consulting IR to see if better drainage is possible with larger or better position drains.  I spoke with IR which will discuss case with surgery tomorrow to determine next steps.  In the meantime will continue Pip/Tazo IV and await blood cultures.  Left retroperitoneal and flank subcutaneous abscesses: 1.  Await IR and surgery recommendations 2.  Continue to Piperacillin-tazobactam 3.375 g every 8 hours 3.  Follow-up blood cultures  Acute kidney injury: Creatinine elevated to 1.37 (baseline 0.8-1.0).  Likely secondary to decreased p.o. Intake. Now s/p 1 L IR. 1.  Continue to monitor  Hypokalemia: Potassium 3.0 today 1.  Ordered 40 mEq potassium chloride for 2 doses.  Dispo: Admit patient to Inpatient with expected length of stay greater than 2 midnights.  Signed: Carroll Sage, MD 09/04/2018, 2:17 PM  Pager: [phone]

## 2018-09-04 NOTE — ED Provider Notes (Signed)
[place] EMERGENCY DEPARTMENT Provider Note   CSN: [id number] Arrival date & time: 09/04/18  1055     History   Chief Complaint Chief Complaint  Patient presents with  . Flank Pain    HPI Daryel Kenneth is a 72 y.o. male.  The history is provided by the patient.  Flank Pain  This is a recurrent problem. The current episode started yesterday. The problem occurs constantly. The problem has been gradually worsening. Associated symptoms include abdominal pain. Pertinent negatives include no chest pain, no headaches and no shortness of breath. Associated symptoms comments: Patient with left sided psoas abscess with two IR drains placed several weeks ago and currently on IV zosyn, augmentin. Nothing aggravates the symptoms. Nothing relieves the symptoms. He has tried nothing (antibiotics) for the symptoms. The treatment provided mild relief.    Past Medical History:  Diagnosis Date  . Acute kidney injury ([place]) 09/21/2017  . Arthritis    "hands, knees, back, legs" (06/10/2014)  . Atonic neurogenic bladder 06/30/2016  . Bilateral leg weakness 10/25/2012  . Cervical myelopathy ([place])   . Chronic diastolic congestive heart failure ([place]) 06/23/2014  . Coma ([place]) 1968   "for 2 wks"  . Fall at home 10/25/2012  . Generalized weakness 06/23/2014  . CNOBSJGG(836.6)    "monthly" (06/10/2014)  . Lower urinary tract infectious disease 10/25/2012  . Malaria   . Obesity 10/25/2012  . Presence of suprapubic catheter ([place]) 06/17/2014   Due to prostate cancer   . Prostate cancer ([place])   . PTSD (post-traumatic stress disorder)    "I'm all over that now"  . Rhabdomyolysis 09/21/2017  . Sleep apnea 10/25/2012  . Staghorn calculus 06/30/2016  . Staphylococcus aureus bacteremia 06/30/2014  . Urinary incontinence, functional 10/25/2012  . UTI (urinary tract infection) 09/2017    Patient Active Problem List   Diagnosis Date Noted  . Pressure injury of skin 08/20/2018  . Retroperitoneal  abscess ([place])   . Nephrolithiasis   . Malnutrition of moderate degree 08/09/2018  . Psoas abscess, left ([place]) 08/02/2018  . Vitamin D deficiency 12/18/2017  . Acute lateral meniscus tear of left knee 09/29/2017  . Acute medial meniscus tear of left knee 09/29/2017  . Vitamin B12 deficiency 09/29/2017  . Staghorn calculus 06/30/2016  . Atonic neurogenic bladder 06/30/2016  . Generalized weakness 06/23/2014  . Chronic diastolic congestive heart failure ([place]) 06/23/2014  . HTN (hypertension) 06/23/2014  . Prostate cancer ([place]) 06/23/2014  . Presence of suprapubic catheter ([place]) 06/17/2014  . Cervical myelopathy ([place])   . Bilateral leg weakness 10/25/2012  . Sleep apnea 10/25/2012    Past Surgical History:  Procedure Laterality Date  . ANTERIOR CERVICAL DECOMP/DISCECTOMY FUSION  05/2007   Archie Endo 02/16/2011  . BACK SURGERY  2008   cervical and thoracic decompression for myelopathy  . IR FLUORO GUIDE CV LINE RIGHT  08/19/2018  . IR REMOVAL TUN CV CATH W/O FL  08/21/2018  . IR US GUIDE VASC ACCESS RIGHT  08/19/2018  . PROSTATE SURGERY  03/2012   "shaved it down; didn't take it off"  . SHRAPNEL REMOVAL  1968   "HEAD, CHEST, LEFT HAND"  . SUPRAPUBIC CATHETER INSERTION    . THORACIC LAMINECTOMY  05/2007   Archie Endo 02/16/2011        Home Medications    Prior to Admission medications   Medication Sig Start Date End Date Taking? Authorizing Provider  acetaminophen (TYLENOL) 325 MG tablet Take 650 mg by mouth 3 (three) times daily.  Yes [provider]  antiseptic oral rinse (BIOTENE) LIQD 15 mLs by Mouth Rinse route 3 (three) times daily.   Yes [provider]  piperacillin-tazobactam (ZOSYN) 3.375 GM/50ML IVPB Inject 50 mLs (3.375 g total) into the vein every 8 (eight) hours for 26 days. 08/20/18 09/15/18 Yes Rehman, Areeg N, DO  vitamin B-12 (CYANOCOBALAMIN) 1000 MCG tablet Take 2 tablets (2,000 mcg total) by mouth daily. 09/26/17  Yes Hongalgi, Lenis Dickinson, MD    amoxicillin-clavulanate (AUGMENTIN) 875-125 MG tablet Take 1 tablet by mouth 2 (two) times daily. 09/16/18   Rehman, Areeg N, DO  polyethylene glycol (MIRALAX) packet Take 17 g by mouth 2 (two) times daily. Patient not taking: Reported on 08/02/2018 09/26/17   Modena Jansky, MD  senna (SENOKOT) 8.6 MG TABS tablet Take 2 tablets (17.2 mg total) by mouth at bedtime as needed for mild constipation or moderate constipation. Patient not taking: Reported on 08/02/2018 09/26/17   Modena Jansky, MD    Family History Family History  Problem Relation Age of Onset  . Heart disease Mother   . Dementia Mother   . Heart disease Father   . Dementia Father     Social History Social History   Tobacco Use  . Smoking status: Never Smoker  . Smokeless tobacco: Never Used  . Tobacco comment: "smoked cigarettes  for ~ 1 wk in [place]"  Substance Use Topics  . Alcohol use: No  . Drug use: No     Allergies   Patient has no known allergies.   Review of Systems Review of Systems  Constitutional: Negative for chills and fever.  HENT: Negative for ear pain and sore throat.   Eyes: Negative for pain and visual disturbance.  Respiratory: Negative for cough and shortness of breath.   Cardiovascular: Negative for chest pain and palpitations.  Gastrointestinal: Positive for abdominal pain. Negative for vomiting.  Genitourinary: Positive for flank pain. Negative for dysuria and hematuria.  Musculoskeletal: Negative for arthralgias and back pain.  Skin: Negative for color change and rash.  Neurological: Negative for seizures, syncope and headaches.  All other systems reviewed and are negative.    Physical Exam Updated Vital Signs  ED Triage Vitals  Enc Vitals Group     BP 09/04/18 1106 (!) 95/48     Pulse Rate 09/04/18 1106 73     Resp 09/04/18 1106 18     Temp 09/04/18 1106 97.7 F (36.5 C)     Temp Source 09/04/18 1106 Oral     SpO2 09/04/18 1106 94 %     Weight 09/04/18 1108  221 lb 12.5 oz (100.6 kg)     Height 09/04/18 1108 5\' 11"  (1.803 m)     Head Circumference --      Peak Flow --      Pain Score 09/04/18 1107 5     Pain Loc --      Pain Edu? --      Excl. in [place]? --     Physical Exam  Constitutional: He is oriented to person, place, and time. He appears well-developed and well-nourished.  HENT:  Head: Normocephalic and atraumatic.  Eyes: Pupils are equal, round, and reactive to light. Conjunctivae and EOM are normal.  Neck: Normal range of motion. Neck supple.  Cardiovascular: Normal rate, regular rhythm, normal heart sounds and intact distal pulses.  No murmur heard. Pulmonary/Chest: Effort normal and breath sounds normal. No respiratory distress.  Abdominal: Soft. He exhibits no distension. There is  tenderness (TTP to left flank).  2 IR drains are placed over the left flank, 1 drain site with purulent drainage around IR tubing, purulent material within drain, second IR tubing overall well-appearing with clear drainage, redness surrounding both IR sites  Musculoskeletal: Normal range of motion. He exhibits no edema.  Neurological: He is alert and oriented to person, place, and time.  Skin: Skin is warm and dry. Capillary refill takes less than 2 seconds.  Psychiatric: He has a normal mood and affect.  Nursing note and vitals reviewed.    ED Treatments / Results  Labs (all labs ordered are listed, but only abnormal results are displayed) Labs Reviewed  COMPREHENSIVE METABOLIC PANEL - Abnormal; Notable for the following components:      Result Value   Potassium 3.0 (*)    Glucose, Bld 119 (*)    Creatinine, Ser 1.37 (*)    Total Protein 5.8 (*)    Albumin 1.3 (*)    GFR calc non Af Amer 49 (*)    GFR calc Af Amer 57 (*)    All other components within normal limits  CBC WITH DIFFERENTIAL/PLATELET - Abnormal; Notable for the following components:   RBC 3.25 (*)    Hemoglobin 9.0 (*)    HCT 30.5 (*)    MCHC 29.5 (*)    RDW 17.7 (*)     Neutro Abs 7.9 (*)    All other components within normal limits  URINALYSIS, ROUTINE W REFLEX MICROSCOPIC - Abnormal; Notable for the following components:   APPearance HAZY (*)    Hgb urine dipstick LARGE (*)    Leukocytes, UA LARGE (*)    RBC / HPF >50 (*)    WBC, UA >50 (*)    Bacteria, UA RARE (*)    All other components within normal limits  I-STAT CG4 LACTIC ACID, ED - Abnormal; Notable for the following components:   Lactic Acid, Venous 2.12 (*)    All other components within normal limits  CULTURE, BLOOD (ROUTINE X 2)  CULTURE, BLOOD (ROUTINE X 2)  URINE CULTURE  LIPASE, BLOOD  I-STAT CG4 LACTIC ACID, ED    EKG None  Radiology Ct Abdomen Pelvis Wo Contrast  Result Date: 09/04/2018 CLINICAL DATA:  Abdominal distention. EXAM: CT ABDOMEN AND PELVIS WITHOUT CONTRAST TECHNIQUE: Multidetector CT imaging of the abdomen and pelvis was performed following the standard protocol without IV contrast. COMPARISON:  08/15/2018 FINDINGS: Lower chest: Chronic volume loss and opacity in the right lower lobe. Atherosclerosis that is extensive. Hepatobiliary: No focal liver abnormality.Large peripherally calcified gallstone separate from the gallbladder wall. No inflammation. Pancreas: Unremarkable. Spleen: Unremarkable. Adrenals/Urinary Tract: Negative adrenals. Right internal ureteral stent with distal retention loop formed at the level of the ureter. Moderate right hydronephrosis with multiple intrarenal calculi, the largest casting calices and measuring up to 9 mm. Left nephrectomy. Decompressed bladder with suprapubic catheter. Stomach/Bowel: No obstruction. No inflammatory changes. Fluid levels reach the distal colon. Vascular/Lymphatic: Extensive atherosclerosis. No mass or adenopathy. Reproductive:Negative Other: Known left retroperitoneal collection containing primarily fluid but also gas, extending from the inferior aspect of the diaphragm to the left iliac fossa, intimately associated with  the psoas. The collection is 17 cm in craniocaudal span by 11 cm in maximal diameter. Despite an indwelling catheter this collection has increased in size. Subcutaneous collection in the left flank with indwelling catheter has also increased in size, now 19 cm in maximal span by up to 6 cm in diameter on axial slices. Anti dependent  fat density is seen within this collection which is otherwise cystic density. Musculoskeletal: Advanced spinal degeneration with multilevel thoracic laminectomy. Generalized spondylosis and facet degeneration with severe spinal stenosis at L4-5. IMPRESSION: 1. Left retroperitoneum and left flank subcutaneous fluid collections have increased in size from 08/15/2018, despite well-positioned drainage catheters. The subcutaneous collection has layering fat density, is the drainage chylous? 2. Stable right hydronephrosis with indwelling ureteral stent. The lower retention loop is still formed at the level of the lower ureter. Electronically Signed   By: Monte Fantasia M.D.   On: 09/04/2018 13:58    Procedures Procedures (including critical care time)  Medications Ordered in ED Medications  lactated ringers bolus 500 mL (500 mLs Intravenous New Bag/Given 09/04/18 1401)  lactated ringers bolus 500 mL (0 mLs Intravenous Stopped 09/04/18 1400)     Initial Impression / Assessment and Plan / ED Course  I have reviewed the triage vital signs and the nursing notes.  Pertinent labs & imaging results that were available during my care of the patient were reviewed by me and considered in my medical decision making (see chart for details).     Sherod Cisse is a 72 year old male with history of paraplegia, chronic suprapubic catheter, congestive heart failure who presents to the ED with worsening left-sided flank pain.  Patient with unremarkable vitals.  No fever.  Patient with left-sided retroperitoneal abscess that was treated by interventional radiology with 2 IR catheters several  weeks ago.  Patient has been on IV Zosyn.  Patient returns for worsening pain.  Patient has purulent drainage from 1 of the catheter sites.  Has some redness around that site as well.  Patient has been getting IV Zosyn via PICC line.  Concern for failed therapy from IR drains.  Patient had CT scan that shows increasing fluid collection.  Otherwise patient had mild increase in lactic acid but no significant leukocytosis.  No fever.  Hemodynamically patient is stable.  Interventional radiology was consulted and came down to the ED to evaluate the patient and they recommend that general surgery likely get involved as well.  At this time no definitive plan for further treatment.  May need different IR drain but likely would benefit from a surgical washout at this time.  Internal medicine service was consulted for admission until plan could be made.  We will hold any antibiotics at this time as cultures did grow out sensitivities to Zosyn.  Patient admitted to medicine service for further care.  This chart was dictated using voice recognition software.  Despite best efforts to proofread,  errors can occur which can change the documentation meaning.   Final Clinical Impressions(s) / ED Diagnoses   Final diagnoses:  Retroperitoneal abscess [facility])    ED Discharge Orders    None       Lennice Sites, DO 09/04/18 1437

## 2018-09-04 NOTE — Consult Note (Addendum)
[facility] Surgery Consult Note  Jerry [name] [birth year]  [id number].    Requesting MD: Dan Maker, MD  Chief Complaint/Reason for Consult: retroperitoneal abscess HPI:  Jerry [name] is a 72 y/o M with MMP and recent hospital admission (discharged 08/21/18) for sepsis secondary to left retroperitoneal abscess who presented to the ED with a cc flank pain. Patient sates the pain over his left flank has been persistent since this hospital discharge. Since discharge he has been on IV antibiotics via PICC line. The NP at the facility where he lives voiced concerns about his JP drains not adequately draining so he was sent to the ED for evaluation.   ROS: Review of Systems  Constitutional: Negative for chills and fever.  Gastrointestinal: Positive for diarrhea (loose, non-bloody stools). Negative for abdominal pain, blood in stool, constipation, nausea and vomiting.  Genitourinary: Positive for flank pain.  All other systems reviewed and are negative.   Family History  Problem Relation Age of Onset  . Heart disease Mother   . Dementia Mother   . Heart disease Father   . Dementia Father   . Alcohol abuse Father     Past Medical History:  Diagnosis Date  . Acute kidney injury ([place]) 09/21/2017  . Arthritis    "hands, knees, back, legs" (06/10/2014)  . Atonic neurogenic bladder 06/30/2016  . Bilateral leg weakness 10/25/2012  . Cervical myelopathy ([place])   . Chronic diastolic congestive heart failure ([place]) 06/23/2014  . Coma ([place]) 1968   "for 2 wks"  . Fall at home 10/25/2012  . Generalized weakness 06/23/2014  . TWSFKCLE(751.7)    "monthly" (06/10/2014)  . Lower urinary tract infectious disease 10/25/2012  . Malaria   . Obesity 10/25/2012  . Presence of suprapubic catheter ([place]) 06/17/2014   Due to prostate cancer   . Prostate cancer ([place])   . PTSD (post-traumatic stress disorder)    "I'm all over that now"  . Rhabdomyolysis 09/21/2017  . Sleep apnea 10/25/2012  . Staghorn calculus  06/30/2016  . Staphylococcus aureus bacteremia 06/30/2014  . Urinary incontinence, functional 10/25/2012  . UTI (urinary tract infection) 09/2017    Past Surgical History:  Procedure Laterality Date  . ANTERIOR CERVICAL DECOMP/DISCECTOMY FUSION  05/2007   Archie Endo 02/16/2011  . BACK SURGERY  2008   cervical and thoracic decompression for myelopathy  . IR FLUORO GUIDE CV LINE RIGHT  08/19/2018  . IR REMOVAL TUN CV CATH W/O FL  08/21/2018  . IR US GUIDE VASC ACCESS RIGHT  08/19/2018  . PROSTATE SURGERY  03/2012   "shaved it down; didn't take it off"  . SHRAPNEL REMOVAL  1968   "HEAD, CHEST, LEFT HAND"  . SUPRAPUBIC CATHETER INSERTION    . THORACIC LAMINECTOMY  05/2007   Archie Endo 02/16/2011    Social History:  reports that he has never smoked. He has never used smokeless tobacco. He reports that he does not drink alcohol or use drugs.  Allergies: No Known Allergies   (Not in a hospital admission)  Blood pressure (!) 108/50, pulse 60, temperature 97.7 F (36.5 C), temperature source Oral, resp. rate 16, height _0  (1.803 m), weight 100.6 kg, SpO2 98 %. Physical Exam: Physical Exam  Constitutional: He is oriented to person, place, and time. He appears well-developed. No distress.  HENT:  Head: Normocephalic and atraumatic.  Right Ear: External ear normal.  Left Ear: External ear normal.  Mouth/Throat: Oropharynx is clear and moist.  Eyes: Pupils are equal, round, and reactive to light. Right  eye exhibits no discharge. Left eye exhibits no discharge. No scleral icterus.  Neck: Neck supple. No JVD present. No tracheal deviation present.  Cardiovascular: Normal rate, regular rhythm, normal heart sounds and intact distal pulses.  Pulmonary/Chest: Effort normal and breath sounds normal. No stridor. No respiratory distress. He has no wheezes.  Abdominal: Soft. Bowel sounds are normal. He exhibits no distension. There is no tenderness. There is no guarding.  Left, lower posterior flank with 2  perc drains in place, inferior drain seems to be pulling out of the skin. Purulent material in drains.  Inferior and lateral to the drains is a roughly 4x7 cm area of erythema and induration.   Musculoskeletal: He exhibits no tenderness or deformity.  Neurological: He is alert and oriented to person, place, and time. No sensory deficit.  Skin: Skin is warm and dry. He is not diaphoretic.  Psychiatric: He has a normal mood and affect. His behavior is normal.    Results for orders placed or performed during the hospital encounter of 09/04/18 (from the past 48 hour(s))  Urinalysis, Routine w reflex microscopic     Status: Abnormal   Collection Time: 09/04/18 11:15 AM  Result Value Ref Range   Color, Urine YELLOW YELLOW   APPearance HAZY (A) CLEAR   Specific Gravity, Urine 1.009 1.005 - 1.030   pH 6.0 5.0 - 8.0   Glucose, UA NEGATIVE NEGATIVE mg/dL   Hgb urine dipstick LARGE (A) NEGATIVE   Bilirubin Urine NEGATIVE NEGATIVE   Ketones, ur NEGATIVE NEGATIVE mg/dL   Protein, ur NEGATIVE NEGATIVE mg/dL   Nitrite NEGATIVE NEGATIVE   Leukocytes, UA LARGE (A) NEGATIVE   RBC / HPF >50 (H) 0 - 5 RBC/hpf   WBC, UA >50 (H) 0 - 5 WBC/hpf   Bacteria, UA RARE (A) NONE SEEN   Squamous Epithelial / LPF 0-5 0 - 5   Mucus PRESENT     Comment: Performed at [facility], [address], [place], [place] [address]  Comprehensive metabolic panel     Status: Abnormal   Collection Time: 09/04/18 12:19 PM  Result Value Ref Range   Sodium 138 135 - 145 mmol/L   Potassium 3.0 (L) 3.5 - 5.1 mmol/L   Chloride 106 98 - 111 mmol/L   CO2 26 22 - 32 mmol/L   Glucose, Bld 119 (H) 70 - 99 mg/dL   BUN 10 8 - 23 mg/dL   Creatinine, Ser 1.37 (H) 0.61 - 1.24 mg/dL   Calcium 9.2 8.9 - 10.3 mg/dL   Total Protein 5.8 (L) 6.5 - 8.1 g/dL   Albumin 1.3 (L) 3.5 - 5.0 g/dL   AST 17 15 - 41 U/L   ALT 8 0 - 44 U/L   Alkaline Phosphatase 84 38 - 126 U/L   Total Bilirubin 0.3 0.3 - 1.2 mg/dL   GFR calc non Af Amer 49  (L) >60 mL/min   GFR calc Af Amer 57 (L) >60 mL/min    Comment: (NOTE) The eGFR has been calculated using the CKD EPI equation. This calculation has not been validated in all clinical situations. eGFR's persistently <60 mL/min signify possible Chronic Kidney Disease.    Anion gap 6 5 - 15    Comment: Performed at [facility] [address], [place],  [address]  Lipase, blood     Status: None   Collection Time: 09/04/18 12:19 PM  Result Value Ref Range   Lipase 27 11 - 51 U/L  Comment: Performed at [facility], [place] [address], [place], [place] [address]  CBC with Diff     Status: Abnormal   Collection Time: 09/04/18 12:19 PM  Result Value Ref Range   WBC 10.2 4.0 - 10.5 K/uL   RBC 3.25 (L) 4.22 - 5.81 MIL/uL   Hemoglobin 9.0 (L) 13.0 - 17.0 g/dL   HCT 30.5 (L) 39.0 - 52.0 %   MCV 93.8 80.0 - 100.0 fL   MCH 27.7 26.0 - 34.0 pg   MCHC 29.5 (L) 30.0 - 36.0 g/dL   RDW 17.7 (H) 11.5 - 15.5 %   Platelets 243 150 - 400 K/uL   nRBC 0.0 0.0 - 0.2 %   Neutrophils Relative % 79 %   Neutro Abs 7.9 (H) 1.7 - 7.7 K/uL   Lymphocytes Relative 10 %   Lymphs Abs 1.1 0.7 - 4.0 K/uL   Monocytes Relative 9 %   Monocytes Absolute 0.9 0.1 - 1.0 K/uL   Eosinophils Relative 2 %   Eosinophils Absolute 0.2 0.0 - 0.5 K/uL   Basophils Relative 0 %   Basophils Absolute 0.0 0.0 - 0.1 K/uL   Immature Granulocytes 0 %   Abs Immature Granulocytes 0.04 0.00 - 0.07 K/uL    Comment: Performed at [facility], [place] [address], [place], [place] [address]  I-Stat CG4 Lactic Acid, ED     Status: Abnormal   Collection Time: 09/04/18 12:35 PM  Result Value Ref Range   Lactic Acid, Venous 2.12 (HH) 0.5 - 1.9 mmol/L   Comment NOTIFIED PHYSICIAN    Ct Abdomen Pelvis Wo Contrast  Result Date: 09/04/2018 CLINICAL DATA:  Abdominal distention. EXAM: CT ABDOMEN AND PELVIS WITHOUT CONTRAST TECHNIQUE: Multidetector CT imaging of the abdomen and pelvis was performed following the standard  protocol without IV contrast. COMPARISON:  08/15/2018 FINDINGS: Lower chest: Chronic volume loss and opacity in the right lower lobe. Atherosclerosis that is extensive. Hepatobiliary: No focal liver abnormality.Large peripherally calcified gallstone separate from the gallbladder wall. No inflammation. Pancreas: Unremarkable. Spleen: Unremarkable. Adrenals/Urinary Tract: Negative adrenals. Right internal ureteral stent with distal retention loop formed at the level of the ureter. Moderate right hydronephrosis with multiple intrarenal calculi, the largest casting calices and measuring up to 9 mm. Left nephrectomy. Decompressed bladder with suprapubic catheter. Stomach/Bowel: No obstruction. No inflammatory changes. Fluid levels reach the distal colon. Vascular/Lymphatic: Extensive atherosclerosis. No mass or adenopathy. Reproductive:Negative Other: Known left retroperitoneal collection containing primarily fluid but also gas, extending from the inferior aspect of the diaphragm to the left iliac fossa, intimately associated with the psoas. The collection is 17 cm in craniocaudal span by 11 cm in maximal diameter. Despite an indwelling catheter this collection has increased in size. Subcutaneous collection in the left flank with indwelling catheter has also increased in size, now 19 cm in maximal span by up to 6 cm in diameter on axial slices. Anti dependent fat density is seen within this collection which is otherwise cystic density. Musculoskeletal: Advanced spinal degeneration with multilevel thoracic laminectomy. Generalized spondylosis and facet degeneration with severe spinal stenosis at L4-5. IMPRESSION: 1. Left retroperitoneum and left flank subcutaneous fluid collections have increased in size from 08/15/2018, despite well-positioned drainage catheters. The subcutaneous collection has layering fat density, is the drainage chylous? 2. Stable right hydronephrosis with indwelling ureteral stent. The lower  retention loop is still formed at the level of the lower ureter. Electronically Signed   By: Monte Fantasia M.D.   On: 09/04/2018 13:58  Assessment/Plan Left retroperitoneal abscess  - patient is not septic, mild hypotension which appears baseline - last admission colonic fistula was ruled out.  - continue IV abx - fluid collections are not adequately drained but I do not think surgery would better drain these collections, recommend discussing with IR -  drain exchange, upsizing, repositioning, or flushing drains to better drain abscess. - no acute surgery needs to surgery will sign off, call as needed.   Jill Alexanders, [facility] Surgery 09/04/2018, 3:51 PM Pager: [phone] Consults: [phone]

## 2018-09-04 NOTE — ED Triage Notes (Signed)
Pt presents to ED via EMS for left flank pain.  Pt has a retroperitoneal abscess drain in place.  Pt to ED for worsening pain at site.  Pt reports usually being 5/10 pain and today is 8/10.  Wife is at bedside.  Pt is alert and oriented x4

## 2018-09-04 NOTE — Telephone Encounter (Signed)
Tried to contact pt's RN at [facility]. No answer and no VM came on. Pt is to be scheduled for CT abd/pelvis and drain injection. JM

## 2018-09-04 NOTE — ED Notes (Signed)
Notified phlebotomy the need for additional blood work.

## 2018-09-04 NOTE — ED Notes (Signed)
Patient has a bowel movement. Nurse and 2 nurse techs assisted patient who also assisted turning self side to side. Redness and varies areas on buttocks.

## 2018-09-04 NOTE — ED Notes (Signed)
Phlebotomy at bedside drawing blood. 

## 2018-09-04 NOTE — ED Notes (Signed)
Admit Doctors at bedside. 

## 2018-09-04 NOTE — ED Notes (Signed)
Patient transported to CT 

## 2018-09-04 NOTE — ED Notes (Addendum)
x2 attemps to blood draw.no blood return .reported to [facility] RN.     \\

## 2018-09-04 NOTE — ED Notes (Signed)
Called CT stated will be send for patient shortly. Stated order recently changed to without contrast.

## 2018-09-05 ENCOUNTER — Encounter (HOSPITAL_COMMUNITY): Payer: Self-pay | Admitting: Diagnostic Radiology

## 2018-09-05 ENCOUNTER — Inpatient Hospital Stay (HOSPITAL_COMMUNITY): Payer: Medicare Other

## 2018-09-05 DIAGNOSIS — G4733 Obstructive sleep apnea (adult) (pediatric): Secondary | ICD-10-CM

## 2018-09-05 DIAGNOSIS — N179 Acute kidney failure, unspecified: Secondary | ICD-10-CM

## 2018-09-05 DIAGNOSIS — I110 Hypertensive heart disease with heart failure: Secondary | ICD-10-CM

## 2018-09-05 DIAGNOSIS — E876 Hypokalemia: Secondary | ICD-10-CM

## 2018-09-05 DIAGNOSIS — I5032 Chronic diastolic (congestive) heart failure: Secondary | ICD-10-CM

## 2018-09-05 DIAGNOSIS — R197 Diarrhea, unspecified: Secondary | ICD-10-CM

## 2018-09-05 DIAGNOSIS — L02211 Cutaneous abscess of abdominal wall: Secondary | ICD-10-CM

## 2018-09-05 HISTORY — PX: IR IMAGE GUIDED DRAINAGE PERCUT CATH  PERITONEAL RETROPERIT: IMG5467

## 2018-09-05 HISTORY — PX: IR CATHETER TUBE CHANGE: IMG717

## 2018-09-05 HISTORY — PX: IR SINUS/FIST TUBE CHK-NON GI: IMG673

## 2018-09-05 LAB — BASIC METABOLIC PANEL WITH GFR
Anion gap: 4 — ABNORMAL LOW (ref 5–15)
BUN: 8 mg/dL (ref 8–23)
CO2: 24 mmol/L (ref 22–32)
Calcium: 8.5 mg/dL — ABNORMAL LOW (ref 8.9–10.3)
Chloride: 111 mmol/L (ref 98–111)
Creatinine, Ser: 1.24 mg/dL (ref 0.61–1.24)
GFR calc Af Amer: >60 mL/min
GFR calc non Af Amer: 56 mL/min — ABNORMAL LOW
Glucose, Bld: 63 mg/dL — ABNORMAL LOW (ref 70–99)
Potassium: 2.9 mmol/L — ABNORMAL LOW (ref 3.5–5.1)
Sodium: 139 mmol/L (ref 135–145)

## 2018-09-05 LAB — MAGNESIUM: Magnesium: 1.6 mg/dL — ABNORMAL LOW (ref 1.7–2.4)

## 2018-09-05 LAB — CBC
HCT: 26 % — ABNORMAL LOW (ref 39.0–52.0)
Hemoglobin: 7.6 g/dL — ABNORMAL LOW (ref 13.0–17.0)
MCH: 27.6 pg (ref 26.0–34.0)
MCHC: 29.2 g/dL — ABNORMAL LOW (ref 30.0–36.0)
MCV: 94.5 fL (ref 80.0–100.0)
Platelets: 238 10*3/uL (ref 150–400)
RBC: 2.75 MIL/uL — ABNORMAL LOW (ref 4.22–5.81)
RDW: 17.6 % — ABNORMAL HIGH (ref 11.5–15.5)
WBC: 11.4 10*3/uL — ABNORMAL HIGH (ref 4.0–10.5)
nRBC: 0 % (ref 0.0–0.2)

## 2018-09-05 LAB — URINE CULTURE: Culture: NO GROWTH

## 2018-09-05 LAB — PROTIME-INR
INR: 1.36
Prothrombin Time: 16.6 seconds — ABNORMAL HIGH (ref 11.4–15.2)

## 2018-09-05 MED ORDER — LOPERAMIDE HCL 2 MG PO CAPS
2.0000 mg | ORAL_CAPSULE | ORAL | Status: DC | PRN
  Administered 2018-09-05 – 2018-09-06 (×2): 2 mg via ORAL
  Filled 2018-09-05 (×3): qty 1

## 2018-09-05 MED ORDER — LIDOCAINE HCL 1 % IJ SOLN
INTRAMUSCULAR | Status: AC | PRN
  Administered 2018-09-05: 10 mL

## 2018-09-05 MED ORDER — SODIUM CHLORIDE 0.9% FLUSH: 10.0000 mL | INTRAVENOUS | Status: DC | PRN

## 2018-09-05 MED ORDER — ADULT MULTIVITAMIN W/MINERALS CH
1.0000 | ORAL_TABLET | Freq: Every day | ORAL | Status: DC
  Administered 2018-09-06 – 2018-09-10 (×5): 1 via ORAL
  Filled 2018-09-05 (×5): qty 1

## 2018-09-05 MED ORDER — SODIUM CHLORIDE 0.9% FLUSH
10.0000 mL | Freq: Two times a day (BID) | INTRAVENOUS | Status: DC
  Administered 2018-09-06 – 2018-09-10 (×8): 10 mL

## 2018-09-05 MED ORDER — POTASSIUM CHLORIDE 20 MEQ/15ML (10%) PO SOLN
80.0000 meq | Freq: Once | ORAL | Status: AC
  Administered 2018-09-05: 80 meq via ORAL
  Filled 2018-09-05: qty 60

## 2018-09-05 MED ORDER — FENTANYL CITRATE (PF) 100 MCG/2ML IJ SOLN
INTRAMUSCULAR | Status: AC | PRN
  Administered 2018-09-05: 50 ug via INTRAVENOUS
  Administered 2018-09-05 (×4): 25 ug via INTRAVENOUS

## 2018-09-05 MED ORDER — MIDAZOLAM HCL 2 MG/2ML IJ SOLN
INTRAMUSCULAR | Status: AC | PRN
  Administered 2018-09-05 (×4): 1 mg via INTRAVENOUS

## 2018-09-05 MED ORDER — LOPERAMIDE HCL 2 MG PO CAPS: 2.0000 mg | ORAL_CAPSULE | ORAL | Status: DC | PRN

## 2018-09-05 MED ORDER — ACETAMINOPHEN 325 MG PO TABS
650.0000 mg | ORAL_TABLET | Freq: Four times a day (QID) | ORAL | Status: DC | PRN
  Administered 2018-09-05 – 2018-09-09 (×8): 650 mg via ORAL
  Filled 2018-09-05 (×8): qty 2

## 2018-09-05 MED ORDER — IOPAMIDOL (ISOVUE-300) INJECTION 61%
INTRAVENOUS | Status: AC
  Administered 2018-09-05: 15 mL
  Filled 2018-09-05: qty 50

## 2018-09-05 MED ORDER — LOPERAMIDE HCL 2 MG PO CAPS
4.0000 mg | ORAL_CAPSULE | Freq: Once | ORAL | Status: AC
  Administered 2018-09-05: 4 mg via ORAL
  Filled 2018-09-05: qty 2

## 2018-09-05 MED ORDER — ENSURE ENLIVE PO LIQD
237.0000 mL | Freq: Two times a day (BID) | ORAL | Status: DC
  Administered 2018-09-05 – 2018-09-10 (×10): 237 mL via ORAL

## 2018-09-05 MED ORDER — LIDOCAINE HCL 1 % IJ SOLN
INTRAMUSCULAR | Status: AC
  Filled 2018-09-05: qty 20

## 2018-09-05 MED ORDER — SODIUM CHLORIDE 0.9% FLUSH
10.0000 mL | Freq: Three times a day (TID) | INTRAVENOUS | Status: DC
  Administered 2018-09-06 – 2018-09-09 (×10): 10 mL

## 2018-09-05 MED ORDER — MIDAZOLAM HCL 2 MG/2ML IJ SOLN
INTRAMUSCULAR | Status: AC
  Filled 2018-09-05: qty 4

## 2018-09-05 MED ORDER — FENTANYL CITRATE (PF) 100 MCG/2ML IJ SOLN
INTRAMUSCULAR | Status: AC
  Filled 2018-09-05: qty 4

## 2018-09-05 MED ORDER — SODIUM CHLORIDE 0.9% FLUSH: 10.0000 mL | Freq: Two times a day (BID) | INTRAVENOUS | Status: DC

## 2018-09-05 MED ORDER — ACETAMINOPHEN 650 MG RE SUPP: 650.0000 mg | Freq: Four times a day (QID) | RECTAL | Status: DC | PRN

## 2018-09-05 NOTE — Procedures (Signed)
  Pre-operative Diagnosis:  Enlarging and persistent left flank fluid collections despite having drains in place       Post-operative Diagnosis:  Left flank deep and subcutaneous abscesses   Indications: Enlarging abscess collections  Procedure: 1)  Drain injections 2) Exchange of one drain and removal of another 3) Placement of new subcutaneous drain  Findings: Extensive thick yellow fluid in left flank deep and superficial tissues.  Placed new 12 Fr subcutaneous drain and upsized deep drain to 14 Fr.  Removed greater than 900 ml of thick yellow fluid from the collections  Complications: None     EBL: Minimal  Plan: Follow output.  Routine flushing of both drains.  Will need follow up imaging in few days to ensure that collections are decompressed.  Send fluid for culture and triglycerides (to exclude a chylous source).

## 2018-09-05 NOTE — Progress Notes (Signed)
Chaplain rec'd note that pt desired to create or update AD. Upon meeting with pt, chaplain learned that the AD was already put in place, and that pt's ex-wife would be making all his medical decisions.  Pt stated that the AD had been created by attorney and placed in file, and upon further checking, chaplain sees that AD is in fact noted in file. Chaplain spoke with pt's nurse and asked her to convey to pt that AD was in the file.  The chaplain will return tomorrow to create a [place], if the patient desires.  Tamsen Snider Pager [phone]

## 2018-09-05 NOTE — Progress Notes (Signed)
As I came to visit with patient,  nurse indicated that the patient DOES NOT need a Mental Health AD.  Everything has already been taken care of. Conard Novak, Chaplain   09/05/18 1000  Clinical Encounter Type  Visited With Health care provider (nurse)  Visit Type Follow-up  Referral From Nurse  Consult/Referral To Chaplain

## 2018-09-05 NOTE — Progress Notes (Signed)
Referring Physician(s): Curatolo,A/Wyatt,J  Supervising Physician: Markus Daft  Patient Status:  [facility] - In-pt  Chief Complaint: Left abdominal/flank pain   Subjective: Pt familiar to IR service form prior left flank/RP abscess drain placements on 08/04/18. Cultures grew klebsiella and proteus. 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 Recently discharged from [facility] on 08/21/18 and presented again yesterday with persistent left flank pain and poorly draining abd catheters.  Follow-up CT yesterday showed increase in size of left retroperitoneal and flank abscesses despite well-positioned catheters as well as adjacent  subcutaneous collections, stable right hydronephrosis with indwelling ureteral stent. Request now received for image guided drain exchange/upsizing/manipulation/possible new drain placements.  He currently denies fever, chest pain, worsening dyspnea, cough, abnormal bleeding.  He has had some diarrhea, occasional headaches, intermittent nausea.  Pertinent labs today include WBC 11.4, hemoglobin 7.6, platelets 238k, K2.9, creatinine 1.24.  Currently afebrile.  BP a little soft at 95/51.  Past Medical History:  Diagnosis Date  . Acute kidney injury ([place]) 09/21/2017  . Arthritis    "hands, knees, back, legs" (06/10/2014)  . Atonic neurogenic bladder 06/30/2016  . Bilateral leg weakness 10/25/2012  . Cervical myelopathy ([place])   . Chronic diastolic congestive heart failure ([place]) 06/23/2014  . Coma ([place]) 1968   "for 2 wks"  . Fall at home 10/25/2012  . Generalized weakness 06/23/2014  . OZYYQMGN(003.7)    "monthly" (06/10/2014)  . Lower urinary tract infectious disease 10/25/2012  . Malaria   . Obesity 10/25/2012  . Presence of suprapubic catheter ([place]) 06/17/2014   Due to prostate cancer   . Prostate cancer ([place])   . PTSD (post-traumatic stress  disorder)    "I'm all over that now"  . Rhabdomyolysis 09/21/2017  . Sleep apnea 10/25/2012  . Staghorn calculus 06/30/2016  . Staphylococcus aureus bacteremia 06/30/2014  . Urinary incontinence, functional 10/25/2012  . UTI (urinary tract infection) 09/2017   Past Surgical History:  Procedure Laterality Date  . ANTERIOR CERVICAL DECOMP/DISCECTOMY FUSION  05/2007   Archie Endo 02/16/2011  . BACK SURGERY  2008   cervical and thoracic decompression for myelopathy  . IR FLUORO GUIDE CV LINE RIGHT  08/19/2018  . IR REMOVAL TUN CV CATH W/O FL  08/21/2018  . IR US GUIDE VASC ACCESS RIGHT  08/19/2018  . PROSTATE SURGERY  03/2012   "shaved it down; didn't take it off"  . SHRAPNEL REMOVAL  1968   "HEAD, CHEST, LEFT HAND"  . SUPRAPUBIC CATHETER INSERTION    . THORACIC LAMINECTOMY  05/2007   Archie Endo 02/16/2011      Allergies: Patient has no known allergies.  Medications: Prior to Admission medications   Medication Sig Start Date End Date Taking? Authorizing Provider  acetaminophen (TYLENOL) 325 MG tablet Take 650 mg by mouth 3 (three) times daily.   Yes [provider]  antiseptic oral rinse (BIOTENE) LIQD 15 mLs by Mouth Rinse route 3 (three) times daily.   Yes [provider]  piperacillin-tazobactam (ZOSYN) 3.375 GM/50ML IVPB Inject 50 mLs (3.375 g total) into the vein every 8 (eight) hours for 26 days. 08/20/18 09/15/18 Yes Rehman, Areeg N, DO  vitamin B-12 (CYANOCOBALAMIN) 1000 MCG tablet Take 2 tablets (2,000 mcg total) by mouth daily. 09/26/17  Yes Hongalgi, Lenis Dickinson, MD  amoxicillin-clavulanate (AUGMENTIN) 875-125 MG tablet Take 1 tablet by mouth 2 (two) times daily. 09/16/18   Rehman, Areeg N, DO  polyethylene glycol (MIRALAX) packet Take 17 g by mouth 2 (two) times daily. Patient not taking: Reported on 08/02/2018 09/26/17   Modena Jansky, MD  senna (SENOKOT) 8.6 MG TABS tablet Take 2 tablets (17.2 mg total) by mouth at bedtime as needed for mild constipation or moderate  constipation. Patient not taking: Reported on 08/02/2018 09/26/17   Modena Jansky, MD     Vital Signs: BP (!) 92/49   Pulse 88   Temp (!) 97.5 F (36.4 C) (Oral)   Resp 18   Ht 5\' 11"  (1.803 m)   Wt 221 lb 12.5 oz (100.6 kg)   SpO2 99%   BMI 30.93 kg/m   Physical Exam awake, alert.  Chest with slightly diminished breath sounds right base, left clear.  Heart with regular rate and rhythm.  Abdomen soft, obese, tender left lateral abdominal/flank region, left retroperitoneal and flank drains in place with light brown fluid in JP bulbs.  Induration and erythema adjacent to drains inferior lateral region; suprapubic catheter in place.  No significant lower extremity edema  Imaging: Ct Abdomen Pelvis Wo Contrast  Result Date: 09/04/2018 CLINICAL DATA:  Abdominal distention. EXAM: CT ABDOMEN AND PELVIS WITHOUT CONTRAST TECHNIQUE: Multidetector CT imaging of the abdomen and pelvis was performed following the standard protocol without IV contrast. COMPARISON:  08/15/2018 FINDINGS: Lower chest: Chronic volume loss and opacity in the right lower lobe. Atherosclerosis that is extensive. Hepatobiliary: No focal liver abnormality.Large peripherally calcified gallstone separate from the gallbladder wall. No inflammation. Pancreas: Unremarkable. Spleen: Unremarkable. Adrenals/Urinary Tract: Negative adrenals. Right internal ureteral stent with distal retention loop formed at the level of the ureter. Moderate right hydronephrosis with multiple intrarenal calculi, the largest casting calices and measuring up to 9 mm. Left nephrectomy. Decompressed bladder with suprapubic catheter. Stomach/Bowel: No obstruction. No inflammatory changes. Fluid levels reach the distal colon. Vascular/Lymphatic: Extensive atherosclerosis. No mass or adenopathy. Reproductive:Negative Other: Known left retroperitoneal collection containing primarily fluid but also gas, extending from the inferior aspect of the diaphragm to the  left iliac fossa, intimately associated with the psoas. The collection is 17 cm in craniocaudal span by 11 cm in maximal diameter. Despite an indwelling catheter this collection has increased in size. Subcutaneous collection in the left flank with indwelling catheter has also increased in size, now 19 cm in maximal span by up to 6 cm in diameter on axial slices. Anti dependent fat density is seen within this collection which is otherwise cystic density. Musculoskeletal: Advanced spinal degeneration with multilevel thoracic laminectomy. Generalized spondylosis and facet degeneration with severe spinal stenosis at L4-5. IMPRESSION: 1. Left retroperitoneum and left flank subcutaneous fluid collections have increased in size from 08/15/2018, despite well-positioned drainage catheters. The subcutaneous collection has layering fat density, is the drainage chylous? 2. Stable right hydronephrosis with indwelling ureteral stent. The lower retention loop is still formed at the level of the lower ureter. Electronically Signed   By: Monte Fantasia M.D.   On: 09/04/2018 13:58    Labs:  CBC: Recent Labs    08/15/18 0414 08/18/18 0406 09/04/18 1219 09/05/18 0525  WBC 10.8* 7.8 10.2 11.4*  HGB 8.4* 8.5* 9.0* 7.6*  HCT 29.5* 29.2* 30.5* 26.0*  PLT 433* 355 243 238    COAGS: Recent Labs    08/03/18 0603 09/05/18 0525  INR 1.36 1.36  APTT 43*  --     BMP: Recent Labs    08/18/18 0406 08/19/18 0413 09/04/18 1219 09/05/18 0525  NA 135 136 138 139  K 3.3* 4.0 3.0* 2.9*  CL 104 108 106 111  CO2 25 25 26 24   GLUCOSE 72 74 119* 63*  BUN 8 8 10 8   CALCIUM 8.4* 8.4* 9.2 8.5*  CREATININE 0.96 0.88 1.37* 1.24  GFRNONAA >60 >60 49* 56*  GFRAA >60 >60 57* >60    LIVER FUNCTION TESTS: Recent Labs    09/21/17 0743 08/02/18 1921 08/11/18 1246 09/04/18 1219  BILITOT 1.1 0.5 0.3 0.3  AST 39 20 13* 17  ALT 19 15 8 8   ALKPHOS 70 71 56 84  PROT 7.4 7.1 5.9* 5.8*  ALBUMIN 2.7* 1.9* 1.7* 1.3*     Assessment and Plan: Pt with hx of neurogenic bladder with prior SP cath placement, CHF, 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 Recently discharged from [facility] on 08/[facility]/18 and presented again yesterday with persistent left flank pain and poorly draining abd catheters(placed 08/04/18).  Follow-up CT yesterday showed increase in size of left retroperitoneal and flank abscesses despite well-positioned catheters as well as adjacent  subcutaneous collections, stable right hydronephrosis with indwelling ureteral stent. Request now received for image guided drain exchange/upsizing/manipulation/possible new drain placements. Pertinent labs today include WBC 11.4, hemoglobin 7.6, platelets 238k, K2.9, creatinine 1.24.  Currently afebrile.  BP a little soft at 95/51.  Imaging studies have been reviewed by Dr. Anselm Pancoast.  Plan today is for image guided drain injection/manipulation/exchange/upsizing and possible new drain placements.Risks and benefits discussed with the patient including bleeding, infection, damage to adjacent structures, bowel perforation/fistula connection, and sepsis.  All of the patient's questions were answered, patient is agreeable to proceed. Consent signed and in chart.     Electronically Signed: D. Rowe Robert, PA-C 09/05/2018, 8:31 AM   I spent a total of 25 minutes at the the patient's bedside AND on the patient's hospital floor or unit, greater than 50% of which was counseling/coordinating care for abdominal abscess drains    Patient ID: Jerry [name], male   DOB: [birth year], 72 y.o.   MRN: [id number]

## 2018-09-05 NOTE — Progress Notes (Signed)
Initial Nutrition Assessment  DOCUMENTATION CODES:   Obesity unspecified  INTERVENTION:    Ensure Enlive po BID, each supplement provides 350 kcal and 20 grams of protein  Hormel Shake BID with meals, each supplement provides 520 kcals and 22 grams of protein  Bottled water with all meals per patient preference  MVI daily  NUTRITION DIAGNOSIS:   Increased nutrient needs related to wound healing, chronic illness as evidenced by estimated needs.  GOAL:   Patient will meet greater than or equal to 90% of their needs  MONITOR:   PO intake, Supplement acceptance, Weight trends, Skin  REASON FOR ASSESSMENT:   Malnutrition Screening Tool    ASSESSMENT:   72 yo male with PMH of malaria, PTSD, atonic neurogenic bladder, CHF, obesity who was admitted on 11/20 with worsening left retroperitoneal and left flank subcutaneous abscesses with concern for inadequate JP line drainage.   Patient out of room in Radiology; spoke with family/friend at bedside. She usually brings him meals at the SNF because he does not like the food there. She thinks his taste buds are "frozen from all the antibiotics." He has been eating very poorly for the past 2-3 weeks. He doesn't even want the foods that he usually loves, such as tomato soup. He tried Magic cups last admission, but family/friend in room does not think he liked them. He doesn't like any PO supplements, but has been drinking them. Unsure which supplement he prefers. He prefers bottled water with meals.   Labs reviewed. Potassium 2.9 (L), magnesium 1.6 (L) Medications reviewed and include vitamin B-12, IV antibiotic.  From review of RD notes from recent hospitalization and this admission's MST, patient reports weight loss of 100 lbs in the past year. At the beginning of last admission he weighed 103.9 kg, now down to 100.6 kg. Total weight loss over the past year is 30 kg (66 lbs) per review of weight encounters. 23% weight loss within one  year is significant for the time frame.   Patient was identified to have moderate malnutrition on last admission. Suspect ongoing malnutrition.  NUTRITION - FOCUSED PHYSICAL EXAM:  unable to complete NFPE at this time; patient is out of room for a procedure  Diet Order:   Diet Order            Diet regular Room service appropriate? Yes; Fluid consistency: Thin  Diet effective now              EDUCATION NEEDS:   No education needs have been identified at this time  Skin:  Skin Assessment: Skin Integrity Issues: Skin Integrity Issues:: Stage II, Other (Comment) Stage II: buttocks Other: MASD to buttocks and scrotum  Last BM:  11/20 (type 7)  Height:   Ht Readings from Last 1 Encounters:  09/04/18 5\' 11"  (1.803 m)    Weight:   Wt Readings from Last 1 Encounters:  09/04/18 100.6 kg    Ideal Body Weight:  78.2 kg  BMI:  Body mass index is 30.93 kg/m.  Estimated Nutritional Needs:   Kcal:  2000-2200  Protein:  100-120 gm  Fluid:  2-2.2 L    Molli Barrows, RD, LDN, [place] Pager [phone] After Hours Pager [phone]

## 2018-09-05 NOTE — Progress Notes (Signed)
   Subjective: Mr. Leib reports continued left flank pain which remains constant constant since yesterday.  He also had several episodes of diarrhea throughout the night and was given Imodium.  He has not had a bowel movement since.  He is aware that he will be undergoing an interventional radiology procedure today.  He does not have any further questions.  Objective:  Vital signs in last 24 hours: Vitals:   09/04/18 1837 09/04/18 2138 09/04/18 2300 09/05/18 0400  BP: (!) 101/48 (!) 106/48 (!) 92/49   Pulse: 81 88    Resp: 15 18    Temp: 97.6 F (36.4 C) 98.1 F (36.7 C) 97.9 F (36.6 C) (!) 97.5 F (36.4 C)  TempSrc: Oral Oral Oral Oral  SpO2: 98% 100% 99%   Weight:      Height:       Physical Exam  Constitutional:  Obese male lying in bed in no acute distress.  HENT:  Head: Normocephalic and atraumatic.  Eyes: EOM are normal.  Neck: Normal range of motion.  Cardiovascular: Normal rate, regular rhythm and normal heart sounds.  Pulmonary/Chest: No respiratory distress.  Auscultated lungs anteriorly.  Normal breath sounds heard.  Abdominal: Soft. Bowel sounds are normal. He exhibits no distension. There is no tenderness.  Musculoskeletal: He exhibits no edema or tenderness.  Left lower posterior flank with 2 percutaneous drains in place.  More medially placed drain has significant purulent drainage from the insertion site.  Lateral to the drain is an approximately 5 cm x 6 cm area of induration and erythema which is tender to palpation.  Neurological: He is alert.  Skin: Skin is warm and dry.  Psychiatric: He has a normal mood and affect. His behavior is normal.  Nursing note and vitals reviewed.   Assessment/Plan:  Active Problems:   Psoas abscess, left [facility])   Mr. Johnsey presents with worsening left retroperitoneal and left flank subcutaneous abscesses in the setting of an inadequate JP line drainage.  He is currently on piperacillin tazobactam antibiotic therapy.   His leukocytosis has worsened today to 11.4 but remains afebrile with normal vital signs.  Blood culture NGTD x1day and urine cultures no growth.  He is scheduled to undergo an image guided drain injection with possible exchange/upsizing of tubes +/- new drain placement today.  Left retroperitoneal and flank subcutaneous abscesses: 1. Continue to Piperacillin-tazobactam 3.375 g every 8 hours 2.  Follow-up IR procedure   Acute kidney injury: Creatinine improving now 1.24 (baseline 0.8-1.0).  Likely secondary to decreased p.o. Intake. Now s/p 1 L IR. 1.  Continue to monitor  Hypokalemia: Potassium 8.5 today s/p 80 mEq potassium chloride yesterday. 1. Ordered 80 mEq potassium chloride today. 2.  Follow-up magnesium lab  Diarrhea: He has had loose stools for several weeks which have not worsened.  Unlikely to be C. difficile due to consistency and timing.  Imodium given last night. 1.  Continue Imodium as needed  Dispo: Anticipated discharge in approximately in 3 to 5 days.  Carroll Sage, MD 09/05/2018, 6:38 AM Pager: [phone]

## 2018-09-05 NOTE — Progress Notes (Signed)
  Date: 09/05/2018  Patient name: Jerry [name]  Medical record number: [id number]  Date of birth: 07-21-1946   I have seen and evaluated Clydene Fake and discussed their care with the Residency Team.  In brief, patient is a 72 year old male with past medical history of OSA, chronic diastolic heart failure, hypertension who was recently admitted for sepsis secondary to left retroperitoneal/psoas abscess who presented to the ED with left-sided flank pain.  Patient states that since discharge from the hospital he has had persistent left-sided flank pain and that he noted decreasing drainage from his JP tubes over the last week or 2.  Patient also complained of associated generalized weakness as well as a decreased appetite.  He denied any fevers or chills, no shortness of breath, no chest pain, no palpitations, no lightheadedness, no syncope, no focal weakness, no headache, no blurry vision, no nausea or vomiting, no diarrhea.  Here he was noted to have increasing size of his abscesses in his left retroperitoneum as well as left flank.  Patient states he still has some persistent pain in his left flank.  He also complained of worsening generalized weakness and decreased appetite today.  PMHx, Fam Hx, and/or Soc Hx : As per resident admit note  Vitals:   09/05/18 1530 09/05/18 1600  BP: (!) 89/49   Pulse: 72   Resp:    Temp:  (!) 97.2 F (36.2 C)  SpO2: 97%    General: Awake, alert, oriented x3, NAD CVS: Regular rate and rhythm Lungs: CTA bilaterally Abdomen: Soft, area of induration and erythema noted near the JP drain in his left flank.  2 drains noted in the left flank.  More medially placed pain has significant purulent discharge around insertion site. Extremities: No edema noted  Assessment and Plan: I have seen and evaluated the patient as outlined above. I agree with the formulated Assessment and Plan as detailed in the residents' note, with the following changes:   1.  Worsening  left retroperitoneal and left flank subcutaneous abscesses: -Patient presented to the ED with worsening left flank pain as well as decreased drainage from his JP tubes noted at the nursing home.  CT abdomen pelvis done here showed increasing size of his abscesses.  -We will continue with piperacillin/tazobactam for now -IR follow-up and recommendations appreciated.  Patient is now status post drain injections as well as exchange of one drain and removal of another and placement of a new subcutaneous.Marland Kitchen  Approximately 900 mL of purulent fluid was drained during this procedure. -We will get follow-up CT in 2 to 3 days to ensure that the collections are decreasing -We will follow-up fluid for culture as well as triglycerides to exclude a common source -Potassium is 2.9 today and magnesium is low as well.  Will supplement electrolytes -No further work-up at this time.  Will monitor closely  Aldine Contes, MD 11/21/20194:40 PM

## 2018-09-06 ENCOUNTER — Encounter (HOSPITAL_COMMUNITY): Payer: Self-pay

## 2018-09-06 ENCOUNTER — Telehealth: Payer: Self-pay | Admitting: Behavioral Health

## 2018-09-06 DIAGNOSIS — I9589 Other hypotension: Secondary | ICD-10-CM

## 2018-09-06 DIAGNOSIS — D638 Anemia in other chronic diseases classified elsewhere: Secondary | ICD-10-CM

## 2018-09-06 LAB — CBC WITH DIFFERENTIAL/PLATELET
Abs Immature Granulocytes: 0.05 10*3/uL (ref 0.00–0.07)
BASOS ABS: 0 10*3/uL (ref 0.0–0.1)
BASOS PCT: 0 %
EOS ABS: 0.5 10*3/uL (ref 0.0–0.5)
Eosinophils Relative: 6 %
HCT: 28.1 % — ABNORMAL LOW (ref 39.0–52.0)
Hemoglobin: 8.2 g/dL — ABNORMAL LOW (ref 13.0–17.0)
IMMATURE GRANULOCYTES: 1 %
Lymphocytes Relative: 25 %
Lymphs Abs: 2.3 10*3/uL (ref 0.7–4.0)
MCH: 27.6 pg (ref 26.0–34.0)
MCHC: 29.2 g/dL — ABNORMAL LOW (ref 30.0–36.0)
MCV: 94.6 fL (ref 80.0–100.0)
MONOS PCT: 9 %
Monocytes Absolute: 0.8 10*3/uL (ref 0.1–1.0)
NEUTROS ABS: 5.5 10*3/uL (ref 1.7–7.7)
NEUTROS PCT: 59 %
PLATELETS: 274 10*3/uL (ref 150–400)
RBC: 2.97 MIL/uL — ABNORMAL LOW (ref 4.22–5.81)
RDW: 17.8 % — AB (ref 11.5–15.5)
WBC: 9.3 10*3/uL (ref 4.0–10.5)
nRBC: 0 % (ref 0.0–0.2)

## 2018-09-06 LAB — BASIC METABOLIC PANEL
ANION GAP: 4 — AB (ref 5–15)
BUN: 8 mg/dL (ref 8–23)
CALCIUM: 9.3 mg/dL (ref 8.9–10.3)
CO2: 25 mmol/L (ref 22–32)
Chloride: 110 mmol/L (ref 98–111)
Creatinine, Ser: 1.21 mg/dL (ref 0.61–1.24)
GFR calc non Af Amer: 57 mL/min — ABNORMAL LOW (ref >60)
Glucose, Bld: 74 mg/dL (ref 70–99)
Potassium: 3.9 mmol/L (ref 3.5–5.1)
Sodium: 139 mmol/L (ref 135–145)

## 2018-09-06 LAB — MAGNESIUM: MAGNESIUM: 1.7 mg/dL (ref 1.7–2.4)

## 2018-09-06 NOTE — Progress Notes (Addendum)
   Subjective: [name] reports continued left flank pain.  He also reports a new onset of diffuse rash on his chest, abdomen, and legs.  He reports that it is nonpruritic and nonpainful.  He has never had anything like this happen in the past.  He otherwise has no acute complaints including shortness of breath and chest pain.  Objective:  Vital signs in last 24 hours: Vitals:   09/06/18 0848 09/06/18 1130 09/06/18 1132 09/06/18 1203  BP: (!) 95/49 (!) 84/42 (!) 83/44 (!) 95/48  Pulse: 66 64 63 61  Resp:      Temp: 97.6 F (36.4 C)   97.9 F (36.6 C)  TempSrc: Oral   Axillary  SpO2: 97% 99% 100% 99%  Weight:      Height:       Physical Exam  Constitutional: He appears well-developed and well-nourished.  HENT:  Head: Normocephalic and atraumatic.  Eyes: EOM are normal.  Neck: Normal range of motion.  Cardiovascular: Normal rate and regular rhythm.  Pulmonary/Chest: Effort normal and breath sounds normal.  Abdominal: Soft. Bowel sounds are normal. He exhibits no distension. There is no tenderness.  Musculoskeletal: He exhibits no edema or tenderness.  Lateral left flank with 2 drains in place.  More lateral: JP drain with 50 mils of serosanguineous fluid.  More medial: Gravity bag in place without any content.   Neurological: He is alert.  Skin:  Scant blanching macular rash on upper chest and calves bilaterally.  Psychiatric: He has a normal mood and affect. His behavior is normal.  Nursing note and vitals reviewed.   Assessment/Plan:  Active Problems:   Psoas abscess, left [facility])  Mr. Waide presented with worsening left retroperitoneal and left flank subcutaneous abscesses in the setting of an inadequate JP line drainage now s/p IR procedure (new subcutaneous drain placed and upsized the other drain--> 900 mL pus removed). He is currently on piperacillin-tazobactam antibiotic therapy. He does seem to be improving. His leukocytosis has improved to normal range (9.3) and he  has remained afebrile. He does have hypotension but this seems like a chronic issue due to poor PO intake.   Left retroperitoneal and flank subcutaneous abscesses: 1. Continue to Piperacillin-tazobactam3.375 g every 8 hours 2. Repeat CT in 2-3 days  Acute kidney injury:Creatinine improving now 1.21 (baseline 0.8-1.0).Likely secondary to decreased p.o. Intake. 1. Continueto monitor  Hypokalemia:Resolved 1. Continue to monitor  Diarrhea:  1.  Continue Imodium as needed  Scattered Macular Rash: Likely a contact dermatitis.  1. Continue to monitor  Anemia of Chronic Disease: Hb today 8.2 (baseline 8-9). 1. Continue to monitor  Dispo: Anticipated discharge in approximately 2-3 days.  Carroll Sage, MD 09/06/2018, 1:59 PM Pager: [phone]

## 2018-09-06 NOTE — Social Work (Signed)
Pt is LTC resident at [facility]. FL2 complete and admissions liaison [facility] aware.  Westley Hummer, MSW, [place] Work [phone]

## 2018-09-06 NOTE — Telephone Encounter (Signed)
Noted. Thanks.

## 2018-09-06 NOTE — [place] FL2 (Signed)
[place] MEDICAID FL2 LEVEL OF CARE SCREENING TOOL     IDENTIFICATION  Patient Name: Jerry [name] Birthdate: 1946/05/12 Sex: male Admission Date (Current Location): 09/04/2018  [facility] and [place] Number:  Herbalist and Address:  [facility] [place] [address], [place], [place] [address]      Provider Number: [id number]  Attending Physician Name and Address:  Aldine Contes, MD  Relative Name and Phone Number:  Albertine Patricia [phone]    Current Level of Care: Hospital Recommended Level of Care: [place] Prior Approval Number:    Date Approved/Denied:   PASRR Number: [id number]  Discharge Plan: SNF    Current Diagnoses: Patient Active Problem List   Diagnosis Date Noted  . Pressure injury of skin 08/20/2018  . Retroperitoneal abscess ([place])   . Nephrolithiasis   . Malnutrition of moderate degree 08/09/2018  . Psoas abscess, left ([place]) 08/02/2018  . Vitamin D deficiency 12/18/2017  . Acute lateral meniscus tear of left knee 09/29/2017  . Acute medial meniscus tear of left knee 09/29/2017  . Vitamin B12 deficiency 09/29/2017  . Staghorn calculus 06/30/2016  . Atonic neurogenic bladder 06/30/2016  . Generalized weakness 06/23/2014  . Chronic diastolic congestive heart failure ([place]) 06/23/2014  . HTN (hypertension) 06/23/2014  . Prostate cancer ([place]) 06/23/2014  . Presence of suprapubic catheter ([place]) 06/17/2014  . Cervical myelopathy ([place])   . Bilateral leg weakness 10/25/2012  . Sleep apnea 10/25/2012    Orientation RESPIRATION BLADDER Height & Weight     Self, Time, Situation, Place  Normal Indwelling catheter(chronic suprapubic catheter) Weight: 221 lb 12.5 oz (100.6 kg) Height:  5\' 11"  (180.3 cm)  BEHAVIORAL SYMPTOMS/MOOD NEUROLOGICAL BOWEL NUTRITION STATUS      Incontinent(rectal pouch)  Diet (see discharge summary)  AMBULATORY STATUS COMMUNICATION OF NEEDS Skin   Extensive Assist Verbally PU Stage  and Appropriate Care, Other (Comment)(MASD on buttocks and scrotum; skin tear on R knee with foam; Stage II PU on buttocks)                       Personal Care Assistance Level of Assistance  Bathing, Feeding, Dressing Bathing Assistance: Maximum assistance Feeding assistance: Limited assistance Dressing Assistance: Maximum assistance     Functional Limitations Info  Sight, Hearing, Speech Sight Info: Adequate Hearing Info: Adequate Speech Info: Adequate    SPECIAL CARE FACTORS FREQUENCY  PT (By licensed PT), OT (By licensed OT)     PT Frequency: 5x week OT Frequency: 5x week            Contractures Contractures Info: Not present    Additional Factors Info  Code Status, Allergies, Isolation Precautions Code Status Info: Full Code Allergies Info: No Known Allergies     Isolation Precautions Info: contact precautions VRE     Current Medications (09/06/2018):  This is the current hospital active medication list Current Facility-Administered Medications  Medication Dose Route Frequency Provider Last Rate Last Dose  . acetaminophen (TYLENOL) tablet 650 mg  650 mg Oral Q6H PRN Ina Homes, MD   650 mg at 09/06/18 1306   Or  . acetaminophen (TYLENOL) suppository 650 mg  650 mg Rectal Q6H PRN Helberg, Larkin Ina, MD      . feeding supplement (ENSURE ENLIVE) (ENSURE ENLIVE) liquid 237 mL  237 mL Oral BID BM Dareen Piano, Nischal, MD   237 mL at 09/06/18 1302  . loperamide (IMODIUM) capsule 2 mg  2 mg Oral PRN Nita Sickle M,  MD   2 mg at 09/06/18 1030  . multivitamin with minerals tablet 1 tablet  1 tablet Oral Daily Aldine Contes, MD   1 tablet at 09/06/18 1030  . ondansetron (ZOFRAN) tablet 4 mg  4 mg Oral Q6H PRN Ina Homes, MD       Or  . ondansetron (ZOFRAN) injection 4 mg  4 mg Intravenous Q6H PRN Helberg, Justin, MD      . piperacillin-tazobactam (ZOSYN) IVPB 3.375 g  3.375 g Intravenous Q8H Narendra, Nischal, MD 12.5 mL/hr at 09/06/18 1031 3.375 g at  09/06/18 1031  . senna (SENOKOT) tablet 17.2 mg  2 tablet Oral QHS PRN Helberg, Larkin Ina, MD      . sodium chloride flush (NS) 0.9 % injection 10 mL  10 mL Intracatheter Q8H Henn, Adam, MD      . sodium chloride flush (NS) 0.9 % injection 10-40 mL  10-40 mL Intracatheter Q12H Narendra, Nischal, MD      . sodium chloride flush (NS) 0.9 % injection 10-40 mL  10-40 mL Intracatheter PRN Aldine Contes, MD      . vitamin B-12 (CYANOCOBALAMIN) tablet 2,000 mcg  2,000 mcg Oral Daily Ina Homes, MD   2,000 mcg at 09/06/18 1030     Discharge Medications: Please see discharge summary for a list of discharge medications.  Relevant Imaging Results:  Relevant Lab Results:   Additional Information SSN [place], [place]

## 2018-09-06 NOTE — Progress Notes (Signed)
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  Recently discharged from [facility] on 08/21/18 and presented again yesterday with persistent left flank pain and poorly draining abd catheters(placed 08/04/18).    CT showed increase in size of left retroperitoneal and flank abscesses despite well-positioned catheters as well as adjacent subcutaneous collections, stable right hydronephrosis with indwelling ureteral stent.   Image guided placement of a new flank drain yesterday by Dr. Anselm Pancoast.   Subjective:  No complaints.   Allergies: Patient has no known allergies.  Medications: Prior to Admission medications   Medication Sig Start Date End Date Taking? Authorizing Provider  acetaminophen (TYLENOL) 325 MG tablet Take 650 mg by mouth 3 (three) times daily.   Yes [provider]  antiseptic oral rinse (BIOTENE) LIQD 15 mLs by Mouth Rinse route 3 (three) times daily.   Yes [provider]  piperacillin-tazobactam (ZOSYN) 3.375 GM/50ML IVPB Inject 50 mLs (3.375 g total) into the vein every 8 (eight) hours for 26 days. 08/20/18 09/15/18 Yes Rehman, Areeg N, DO  vitamin B-12 (CYANOCOBALAMIN) 1000 MCG tablet Take 2 tablets (2,000 mcg total) by mouth daily. 09/26/17  Yes Hongalgi, Lenis Dickinson, MD  amoxicillin-clavulanate (AUGMENTIN) 875-125 MG tablet Take 1 tablet by mouth 2 (two) times daily. 09/16/18   Rehman, Areeg N, DO  polyethylene glycol (MIRALAX) packet Take 17 g by mouth 2 (two) times daily. Patient not taking: Reported on 08/02/2018 09/26/17   Modena Jansky, MD  senna (SENOKOT) 8.6 MG TABS tablet Take 2 tablets (17.2 mg total) by mouth at bedtime as needed for mild constipation or moderate  constipation. Patient not taking: Reported on 08/02/2018 09/26/17   Modena Jansky, MD     Vital Signs: BP (!) 95/49 (BP Location: Left Arm)   Pulse 66   Temp 97.6 F (36.4 C) (Oral)   Resp (!) 21   Ht 5\' 11"  (1.803 m)   Wt 100.6 kg   SpO2 97%   BMI 30.93 kg/m   Physical Exam  Constitutional: He is oriented to person, place, and time.  Pulmonary/Chest: Effort normal. No respiratory distress.  Abdominal: Soft.  Musculoskeletal:       Back:  Red = lateral flank drain in place. Serosanguinous drainage, ~ 75 mL output Purple = more medial flank drain in place. Milky tan output, ~130 mL. JP bulb would not stay charged. Changed to gravity bag.  Neurological: He is alert and oriented to person, place, and time.  Vitals reviewed.   Imaging: Ct Abdomen Pelvis Wo Contrast  Result Date: 09/04/2018 CLINICAL DATA:  Abdominal distention. EXAM: CT ABDOMEN AND PELVIS WITHOUT CONTRAST TECHNIQUE: Multidetector CT imaging of the abdomen and pelvis was performed following the standard protocol without IV contrast. COMPARISON:  08/15/2018 FINDINGS: Lower chest: Chronic volume loss and opacity in the right lower lobe. Atherosclerosis that is extensive. Hepatobiliary: No focal liver abnormality.Large peripherally calcified gallstone separate from the gallbladder wall. No inflammation. Pancreas: Unremarkable. Spleen: Unremarkable. Adrenals/Urinary Tract: Negative adrenals. Right internal ureteral stent with distal retention loop formed at the level of the ureter. Moderate right hydronephrosis with multiple intrarenal calculi, the largest casting calices and measuring up to 9 mm. Left nephrectomy. Decompressed bladder with suprapubic catheter. Stomach/Bowel: No obstruction. No inflammatory changes. Fluid levels reach the distal colon.  Vascular/Lymphatic: Extensive atherosclerosis. No mass or adenopathy. Reproductive:Negative Other: Known left retroperitoneal collection containing primarily fluid but also  gas, extending from the inferior aspect of the diaphragm to the left iliac fossa, intimately associated with the psoas. The collection is 17 cm in craniocaudal span by 11 cm in maximal diameter. Despite an indwelling catheter this collection has increased in size. Subcutaneous collection in the left flank with indwelling catheter has also increased in size, now 19 cm in maximal span by up to 6 cm in diameter on axial slices. Anti dependent fat density is seen within this collection which is otherwise cystic density. Musculoskeletal: Advanced spinal degeneration with multilevel thoracic laminectomy. Generalized spondylosis and facet degeneration with severe spinal stenosis at L4-5. IMPRESSION: 1. Left retroperitoneum and left flank subcutaneous fluid collections have increased in size from 08/15/2018, despite well-positioned drainage catheters. The subcutaneous collection has layering fat density, is the drainage chylous? 2. Stable right hydronephrosis with indwelling ureteral stent. The lower retention loop is still formed at the level of the lower ureter. Electronically Signed   By: Monte Fantasia M.D.   On: 09/04/2018 13:58   Ir Sinus/fist Tube Chk-non Gi  Result Date: 09/05/2018 INDICATION: 72 year old with history of left nephrectomy and persistent left flank and retroperitoneum fluid collections despite having drains within the left retroperitoneum and the left back subcutaneous tissues. Patient presents for additional drainage of the left flank and retroperitoneal fluid collections. EXAM: DRAIN INJECTIONS X 2 EXCHANGE AND REPOSITIONING OF ONE EXISTING DRAIN PLACEMENT OF NEW SUBCUTANEOUS DRAIN WITH ULTRASOUND GUIDANCE MEDICATIONS: The patient is currently admitted to the hospital and receiving intravenous antibiotics. The antibiotics were administered within an appropriate time frame prior to the initiation of the procedure. ANESTHESIA/SEDATION: Fentanyl 150 mcg IV; Versed 4 mg IV Moderate Sedation Time:   47 minutes The patient was continuously monitored during the procedure by the interventional radiology nurse under my direct supervision. CONTRAST:  15 mL [id number] COMPLICATIONS: None immediate. FLUOROSCOPY TIME:  2 minutes, 6 seconds; 4 mGy PROCEDURE: Informed written consent was obtained from the patient after a thorough discussion of the procedural risks, benefits and alternatives. All questions were addressed. A timeout was performed prior to the initiation of the procedure. Patient was placed prone. Left flank was evaluated with ultrasound. The existing drains and left flank were prepped and draped in sterile fashion. Maximal barrier sterile technique was utilized including caps, mask, sterile gowns, sterile gloves, sterile drape, hand hygiene and skin antiseptic. Skin was anesthetized with 1% lidocaine. Using ultrasound guidance, a Yueh catheter was directed into the large subcutaneous fluid collection and thick yellow fluid was aspirated. Stiff Amplatz wire was placed and the tract was dilated to accommodate a 12 [place] drain. Approximately 250 mL of thick yellow fluid was removed from this subcutaneous space. Aspiration of the fluid required extensive irrigation and catheter manipulation. Attention was directed to the existing drains. The drain in the left retroperitoneum was injected with contrast. The drain was not aspirating well. This drain was cut and removed over an Amplatz wire and a new 14 French drain was advanced into the retroperitoneal fluid collection. A large amount of thick yellow fluid was removed from the retroperitoneal collection. The collection required extensive irrigation and manipulation with the tube in order to decompress the collection. The old superficial drain was not aspirating and it was difficult to flush during the drain injection. Contrast appeared to be refluxing around the catheter. This catheter was completely removed. After removal of this catheter, purulent fluid was  leaking out of the old drain site. Additional fluid was removed from the subcutaneous tissues through the newly placed drain. After extensive irrigation and aspiration from the drains, greater than 900 mL of thick yellow fluid was removed. The 2 drains were sutured to skin and attached to suction bulb. FINDINGS: Massive amount of thick yellow fluid in the left flank subcutaneous tissues and left retroperitoneum. The subcutaneous and retroperitoneal collections were successfully decompressed with placement of a new drain in the subcutaneous tissues and up sizing of the left retroperitoneal drain. Decompression of these collections required extensive irrigation and drain manipulation. The superficial and retroperitoneal collections both appeared to be decompressed at the end of the procedure based on ultrasound. IMPRESSION: Successful decompression of the left flank subcutaneous fluid collection and the left retroperitoneal fluid collection by placing a new subcutaneous drain and up sizing / repositioning the left retroperitoneal drain. The old subcutaneous drain was completely removed. Greater than 900 mL of thick yellow fluid was removed from the collections. Fluid was sent for culture and triglyceride to exclude a chylous leak. Electronically Signed   By: Markus Daft M.D.   On: 09/05/2018 17:26   Ir Sinus/fist Tube Chk-non Gi  Result Date: 09/05/2018 INDICATION: 72 year old with history of left nephrectomy and persistent left flank and retroperitoneum fluid collections despite having drains within the left retroperitoneum and the left back subcutaneous tissues. Patient presents for additional drainage of the left flank and retroperitoneal fluid collections. EXAM: DRAIN INJECTIONS X 2 EXCHANGE AND REPOSITIONING OF ONE EXISTING DRAIN PLACEMENT OF NEW SUBCUTANEOUS DRAIN WITH ULTRASOUND GUIDANCE MEDICATIONS: The patient is currently admitted to the hospital and receiving intravenous antibiotics. The antibiotics  were administered within an appropriate time frame prior to the initiation of the procedure. ANESTHESIA/SEDATION: Fentanyl 150 mcg IV; Versed 4 mg IV Moderate Sedation Time:  47 minutes The patient was continuously monitored during the procedure by the interventional radiology nurse under my direct supervision. CONTRAST:  15 mL [id number] COMPLICATIONS: None immediate. FLUOROSCOPY TIME:  2 minutes, 6 seconds; 4 mGy PROCEDURE: Informed written consent was obtained from the patient after a thorough discussion of the procedural risks, benefits and alternatives. All questions were addressed. A timeout was performed prior to the initiation of the procedure. Patient was placed prone. Left flank was evaluated with ultrasound. The existing drains and left flank were prepped and draped in sterile fashion. Maximal barrier sterile technique was utilized including caps, mask, sterile gowns, sterile gloves, sterile drape, hand hygiene and skin antiseptic. Skin was anesthetized with 1% lidocaine. Using ultrasound guidance, a Yueh catheter was directed into the large subcutaneous fluid collection and thick yellow fluid was aspirated. Stiff Amplatz wire was placed and the tract was dilated to accommodate a 12 [place] drain. Approximately 250 mL of thick yellow fluid was removed from this subcutaneous space. Aspiration of the fluid required extensive irrigation and catheter manipulation. Attention was directed to the existing drains. The drain in the left retroperitoneum was injected with contrast. The drain was not aspirating well. This drain was cut and removed over an Amplatz wire and a new 14 French drain was advanced into the retroperitoneal fluid collection. A large amount of thick yellow fluid was removed from the retroperitoneal collection. The collection required extensive irrigation and manipulation with the tube in order to decompress the collection. The old superficial drain was not aspirating and it was difficult to  flush during the drain injection. Contrast appeared to be refluxing around the catheter. This catheter was completely removed. After removal  of this catheter, purulent fluid was leaking out of the old drain site. Additional fluid was removed from the subcutaneous tissues through the newly placed drain. After extensive irrigation and aspiration from the drains, greater than 900 mL of thick yellow fluid was removed. The 2 drains were sutured to skin and attached to suction bulb. FINDINGS: Massive amount of thick yellow fluid in the left flank subcutaneous tissues and left retroperitoneum. The subcutaneous and retroperitoneal collections were successfully decompressed with placement of a new drain in the subcutaneous tissues and up sizing of the left retroperitoneal drain. Decompression of these collections required extensive irrigation and drain manipulation. The superficial and retroperitoneal collections both appeared to be decompressed at the end of the procedure based on ultrasound. IMPRESSION: Successful decompression of the left flank subcutaneous fluid collection and the left retroperitoneal fluid collection by placing a new subcutaneous drain and up sizing / repositioning the left retroperitoneal drain. The old subcutaneous drain was completely removed. Greater than 900 mL of thick yellow fluid was removed from the collections. Fluid was sent for culture and triglyceride to exclude a chylous leak. Electronically Signed   By: Markus Daft M.D.   On: 09/05/2018 17:26   Ir Catheter Tube Change  Result Date: 09/05/2018 INDICATION: 72 year old with history of left nephrectomy and persistent left flank and retroperitoneum fluid collections despite having drains within the left retroperitoneum and the left back subcutaneous tissues. Patient presents for additional drainage of the left flank and retroperitoneal fluid collections. EXAM: DRAIN INJECTIONS X 2 EXCHANGE AND REPOSITIONING OF ONE EXISTING DRAIN PLACEMENT  OF NEW SUBCUTANEOUS DRAIN WITH ULTRASOUND GUIDANCE MEDICATIONS: The patient is currently admitted to the hospital and receiving intravenous antibiotics. The antibiotics were administered within an appropriate time frame prior to the initiation of the procedure. ANESTHESIA/SEDATION: Fentanyl 150 mcg IV; Versed 4 mg IV Moderate Sedation Time:  47 minutes The patient was continuously monitored during the procedure by the interventional radiology nurse under my direct supervision. CONTRAST:  15 mL [id number] COMPLICATIONS: None immediate. FLUOROSCOPY TIME:  2 minutes, 6 seconds; 4 mGy PROCEDURE: Informed written consent was obtained from the patient after a thorough discussion of the procedural risks, benefits and alternatives. All questions were addressed. A timeout was performed prior to the initiation of the procedure. Patient was placed prone. Left flank was evaluated with ultrasound. The existing drains and left flank were prepped and draped in sterile fashion. Maximal barrier sterile technique was utilized including caps, mask, sterile gowns, sterile gloves, sterile drape, hand hygiene and skin antiseptic. Skin was anesthetized with 1% lidocaine. Using ultrasound guidance, a Yueh catheter was directed into the large subcutaneous fluid collection and thick yellow fluid was aspirated. Stiff Amplatz wire was placed and the tract was dilated to accommodate a 12 [place] drain. Approximately 250 mL of thick yellow fluid was removed from this subcutaneous space. Aspiration of the fluid required extensive irrigation and catheter manipulation. Attention was directed to the existing drains. The drain in the left retroperitoneum was injected with contrast. The drain was not aspirating well. This drain was cut and removed over an Amplatz wire and a new 14 French drain was advanced into the retroperitoneal fluid collection. A large amount of thick yellow fluid was removed from the retroperitoneal collection. The collection  required extensive irrigation and manipulation with the tube in order to decompress the collection. The old superficial drain was not aspirating and it was difficult to flush during the drain injection. Contrast appeared to be refluxing around the catheter. This catheter  was completely removed. After removal of this catheter, purulent fluid was leaking out of the old drain site. Additional fluid was removed from the subcutaneous tissues through the newly placed drain. After extensive irrigation and aspiration from the drains, greater than 900 mL of thick yellow fluid was removed. The 2 drains were sutured to skin and attached to suction bulb. FINDINGS: Massive amount of thick yellow fluid in the left flank subcutaneous tissues and left retroperitoneum. The subcutaneous and retroperitoneal collections were successfully decompressed with placement of a new drain in the subcutaneous tissues and up sizing of the left retroperitoneal drain. Decompression of these collections required extensive irrigation and drain manipulation. The superficial and retroperitoneal collections both appeared to be decompressed at the end of the procedure based on ultrasound. IMPRESSION: Successful decompression of the left flank subcutaneous fluid collection and the left retroperitoneal fluid collection by placing a new subcutaneous drain and up sizing / repositioning the left retroperitoneal drain. The old subcutaneous drain was completely removed. Greater than 900 mL of thick yellow fluid was removed from the collections. Fluid was sent for culture and triglyceride to exclude a chylous leak. Electronically Signed   By: Markus Daft M.D.   On: 09/05/2018 17:26   Ir Image Guided Drainage Percut Cath  Peritoneal Retroperit  Result Date: 09/05/2018 INDICATION: 72 year old with history of left nephrectomy and persistent left flank and retroperitoneum fluid collections despite having drains within the left retroperitoneum and the left back  subcutaneous tissues. Patient presents for additional drainage of the left flank and retroperitoneal fluid collections. EXAM: DRAIN INJECTIONS X 2 EXCHANGE AND REPOSITIONING OF ONE EXISTING DRAIN PLACEMENT OF NEW SUBCUTANEOUS DRAIN WITH ULTRASOUND GUIDANCE MEDICATIONS: The patient is currently admitted to the hospital and receiving intravenous antibiotics. The antibiotics were administered within an appropriate time frame prior to the initiation of the procedure. ANESTHESIA/SEDATION: Fentanyl 150 mcg IV; Versed 4 mg IV Moderate Sedation Time:  47 minutes The patient was continuously monitored during the procedure by the interventional radiology nurse under my direct supervision. CONTRAST:  15 mL [id number] COMPLICATIONS: None immediate. FLUOROSCOPY TIME:  2 minutes, 6 seconds; 4 mGy PROCEDURE: Informed written consent was obtained from the patient after a thorough discussion of the procedural risks, benefits and alternatives. All questions were addressed. A timeout was performed prior to the initiation of the procedure. Patient was placed prone. Left flank was evaluated with ultrasound. The existing drains and left flank were prepped and draped in sterile fashion. Maximal barrier sterile technique was utilized including caps, mask, sterile gowns, sterile gloves, sterile drape, hand hygiene and skin antiseptic. Skin was anesthetized with 1% lidocaine. Using ultrasound guidance, a Yueh catheter was directed into the large subcutaneous fluid collection and thick yellow fluid was aspirated. Stiff Amplatz wire was placed and the tract was dilated to accommodate a 12 [place] drain. Approximately 250 mL of thick yellow fluid was removed from this subcutaneous space. Aspiration of the fluid required extensive irrigation and catheter manipulation. Attention was directed to the existing drains. The drain in the left retroperitoneum was injected with contrast. The drain was not aspirating well. This drain was cut and removed  over an Amplatz wire and a new 14 French drain was advanced into the retroperitoneal fluid collection. A large amount of thick yellow fluid was removed from the retroperitoneal collection. The collection required extensive irrigation and manipulation with the tube in order to decompress the collection. The old superficial drain was not aspirating and it was difficult to flush during the drain injection.  Contrast appeared to be refluxing around the catheter. This catheter was completely removed. After removal of this catheter, purulent fluid was leaking out of the old drain site. Additional fluid was removed from the subcutaneous tissues through the newly placed drain. After extensive irrigation and aspiration from the drains, greater than 900 mL of thick yellow fluid was removed. The 2 drains were sutured to skin and attached to suction bulb. FINDINGS: Massive amount of thick yellow fluid in the left flank subcutaneous tissues and left retroperitoneum. The subcutaneous and retroperitoneal collections were successfully decompressed with placement of a new drain in the subcutaneous tissues and up sizing of the left retroperitoneal drain. Decompression of these collections required extensive irrigation and drain manipulation. The superficial and retroperitoneal collections both appeared to be decompressed at the end of the procedure based on ultrasound. IMPRESSION: Successful decompression of the left flank subcutaneous fluid collection and the left retroperitoneal fluid collection by placing a new subcutaneous drain and up sizing / repositioning the left retroperitoneal drain. The old subcutaneous drain was completely removed. Greater than 900 mL of thick yellow fluid was removed from the collections. Fluid was sent for culture and triglyceride to exclude a chylous leak. Electronically Signed   By: Markus Daft M.D.   On: 09/05/2018 17:26    Labs:  CBC: Recent Labs    08/18/18 0406 09/04/18 1219 09/05/18 0525  09/06/18 0520  WBC 7.8 10.2 11.4* 9.3  HGB 8.5* 9.0* 7.6* 8.2*  HCT 29.2* 30.5* 26.0* 28.1*  PLT 355 243 238 274    COAGS: Recent Labs    08/03/18 0603 09/05/18 0525  INR 1.36 1.36  APTT 43*  --     BMP: Recent Labs    08/19/18 0413 09/04/18 1219 09/05/18 0525 09/06/18 0520  NA 136 138 139 139  K 4.0 3.0* 2.9* 3.9  CL 108 106 111 110  CO2 25 26 24 25   GLUCOSE 74 119* 63* 74  BUN 8 10 8 8   CALCIUM 8.4* 9.2 8.5* 9.3  CREATININE 0.88 1.37* 1.24 1.21  GFRNONAA >60 49* 56* 57*  GFRAA >60 57* >60 >60    LIVER FUNCTION TESTS: Recent Labs    09/21/17 0743 08/02/18 1921 08/11/18 1246 09/04/18 1219  BILITOT 1.1 0.5 0.3 0.3  AST 39 20 13* 17  ALT 19 15 8 8   ALKPHOS 70 71 56 84  PROT 7.4 7.1 5.9* 5.8*  ALBUMIN 2.7* 1.9* 1.7* 1.3*    Assessment and Plan: Left retroperitoneal and flank abscesses  Continue routine drain care  Record output daily  Electronically Signed: Murrell Redden, PA-C 09/06/2018, 10:58 AM    I spent a total of 15 Minutes at the the patient's bedside AND on the patient's hospital floor or unit, greater than 50% of which was counseling/coordinating care for f/u drains

## 2018-09-06 NOTE — Telephone Encounter (Signed)
Patient's POA susan called stating patient would not be able to come to his appointment 09/09/2018.  She also wanted to let an ID provider know Mr Piercefield is hospitalized so they could see him in the hospital. Pricilla Riffle RN

## 2018-09-06 NOTE — Progress Notes (Signed)
Internal Medicine Attending:   I saw and examined the patient. I reviewed the resident's note and I agree with the resident's findings and plan as documented in the resident's note.  Patient still has some persistent left-sided flank pain.  He also complained of new onset rash over his chest, abdomen and legs which was nonpruritic and nontender.  Patient was initially admitted to the hospital with worsening left-sided flank pain was found to have increasing size of his left retroperitoneal and left flank subcutaneous abscesses.  Patient is status post IR procedure for replacement of 1 drain and upsizing the other drain.  Approximately 900 mL of pus was removed at that time.  Patient continues to have good drainage from his drains.  We will continue with Zosyn for now.  Abscess culture growing Enterococcus faecalis.  Will follow up sensitivities.  We will need to change his antibiotics and recall ID if resistant to Zosyn.  Patient was not noted to have a fistula on his last imaging.  However, the source of his abscesses remain uncertain at this time.  Will consider discussing case with ID.

## 2018-09-07 DIAGNOSIS — Z933 Colostomy status: Secondary | ICD-10-CM

## 2018-09-07 MED ORDER — LINEZOLID 600 MG/300ML IV SOLN
600.0000 mg | Freq: Two times a day (BID) | INTRAVENOUS | Status: DC
  Administered 2018-09-07 – 2018-09-08 (×3): 600 mg via INTRAVENOUS
  Filled 2018-09-07 (×5): qty 300

## 2018-09-07 NOTE — Progress Notes (Signed)
  Date: 09/07/2018  Patient name: Jerry [name]  Medical record number: [id number]  Date of birth: Feb 20, 1946   I have seen and evaluated this patient and I have discussed the plan of care with the house staff. Please see their note for complete details. I concur with their findings.  Bartholomew Crews, MD 09/07/2018, 3:39 PM

## 2018-09-07 NOTE — Progress Notes (Signed)
   Subjective: Patient doing well this AM. He is drinking supplements but lacks the appetite for hot food. He is still having some discomfort on the left side. Happy that he got the flexiseal as his diarrhea was causing a lot of anxiety for him. We discussed that we are awaiting culture data and repeat CT on Monday. All questions and concerns addressed.   Objective: Vital signs in last 24 hours: Vitals:   09/06/18 2100 09/06/18 2300 09/07/18 0500 09/07/18 0801  BP: (!) 95/51 (!) 94/51 (!) 96/51   Pulse: 63 66 64   Resp:      Temp: 97.8 F (36.6 C) 97.6 F (36.4 C) 98.1 F (36.7 C) (!) 97.5 F (36.4 C)  TempSrc: Oral Oral Oral Oral  SpO2: 97% 97% 99%   Weight:      Height:       General: Malnourished male in no acute distress Pulm: Good air movement with no wheezing or crackles  CV: RRR, no murmurs, no rubs  Abdomen: Soft, non-distended, no tenderness to palpation  Skin: Erythematous, blanching maculo-papular rash on the arms and legs. Improved compared to prior.    Assessment/Plan:  Mr. Abbey presented with worsening left retroperitoneal and left flank subcutaneous abscesses in the setting of an inadequate JP line drainage now s/p IR procedure (new subcutaneous drain placed and upsized the other drain--> 900 mL pus removed). He is currently on piperacillin-tazobactam antibiotic therapy.    Left retroperitoneal and flank subcutaneous abscesses: 1. Continue to Piperacillin-tazobactam3.375 g every 8 hours 2.Repeat CT no 09/09/2018  Acute kidney injury:Creatinineimproving now1.21(baseline 0.8-1.0).Likely secondary to decreased p.o. Intake. 1. Continueto monitor  Diarrhea: 1. Continue Imodium as needed  Scattered Macular Rash: Likely a contact dermatitis.  1. Continue to monitor  Anemia of Chronic Disease: Hgb at baseline. 1. Continue to monitor  Dispo: Anticipated discharge in approximately 2-3 day(s) pending results of repeat CT abdomen and culture  data.  Ina Homes, MD 09/07/2018, 11:50 AM

## 2018-09-07 NOTE — Clinical Social Work Note (Signed)
Clinical Social Work Assessment  Patient Details  Name: Jerry [name] MRN: [id number] Date of Birth: 07-Feb-1946  Date of referral:  09/07/18               Reason for consult:  Facility Placement                Permission sought to share information with:  [place] granted to share information::  Yes, Verbal Permission Granted  Name::     Jerry [name]  Agency::  [facility]  Relationship::  POA  Contact Information:  [phone]  Housing/Transportation Living arrangements for the past 2 months:  [place] of Information:  Patient Patient Interpreter Needed:  None Criminal Activity/Legal Involvement Pertinent to Current Situation/Hospitalization:  No - Comment as needed Significant Relationships:  Adult Children, Other(Comment) Lives with:  Self Do you feel safe going back to the place where you live?  Yes Need for family participation in patient care:  No (Coment)  Care giving concerns:  Patient to return to [facility] at time of discharge.   Social Worker assessment / plan:  SW met with patient at bedside to complete assessment. Patient informed SW that he is a current resident at [facility] and that he has lived there for six months. Prior to [facility], the patient reports living at [facility] in their rehab program. Patient reports that if the next portion of his stay at [facility] doesn't improve, that he will be requesting discharge from there to go home. Patient expressed concerns to SW regarding the poor performance of third shift staff at [facility]. Patient reports that the staff there have dropped him due to him being unable to walk from paralyzation. SW offered to call facility to express his concerns, however patient denied due to previous attempts with unsolved issues. Patient reports that his ex-wife and POA is Jerry [name], a lawyer from [place] who has submitted photographs and documentation for the poor  care at [facility]. Patient reports that he is at [facility] for observation due to pockets of infection that were found by his medical care team at the [facility]. Patient is agreeable to return to [facility] after his stay at [facility]. Patient's POA to arrive at hospital this afternoon. Patient very pleasant and cooperative during assessment.  Employment status:  Disabled (Comment on whether or not currently receiving Disability) Insurance information:  Programmer, applications, Medicare PT Recommendations:  LTAC Information / Referral to community resources:     Patient/Family's Response to care:  Patient understanding and agreeable to plan.  Patient/Family's Understanding of and Emotional Response to Diagnosis, Current Treatment, and Prognosis:  No concerns noted.  Emotional Assessment Appearance:  Appears stated age Attitude/Demeanor/Rapport:  Engaged, Gracious Affect (typically observed):  Calm, Appropriate, Pleasant Orientation:  Oriented to Place, Oriented to Self, Oriented to Situation, Oriented to  Time Alcohol / Substance use:  Not Applicable Psych involvement (Current and /or in the community):  No (Comment)  Discharge Needs  Concerns to be addressed:    Readmission within the last 30 days:  No Current discharge risk:  None Barriers to Discharge:  Continued Medical Work up  Madilyn Fireman, [place]

## 2018-09-07 NOTE — Progress Notes (Signed)
SW completed assessment for return to SNF. Patient agreeable to return to [facility] whenever medically ready for discharge.  Patient's POA [name] to visit hospital today.  Madilyn Fireman, MSW Medical Social Worker [place]

## 2018-09-08 DIAGNOSIS — R21 Rash and other nonspecific skin eruption: Secondary | ICD-10-CM

## 2018-09-08 DIAGNOSIS — B952 Enterococcus as the cause of diseases classified elsewhere: Secondary | ICD-10-CM

## 2018-09-08 DIAGNOSIS — N319 Neuromuscular dysfunction of bladder, unspecified: Secondary | ICD-10-CM

## 2018-09-08 DIAGNOSIS — K529 Noninfective gastroenteritis and colitis, unspecified: Secondary | ICD-10-CM

## 2018-09-08 DIAGNOSIS — Z905 Acquired absence of kidney: Secondary | ICD-10-CM

## 2018-09-08 DIAGNOSIS — K6812 Psoas muscle abscess: Secondary | ICD-10-CM

## 2018-09-08 DIAGNOSIS — K6819 Other retroperitoneal abscess: Principal | ICD-10-CM

## 2018-09-08 DIAGNOSIS — Z8546 Personal history of malignant neoplasm of prostate: Secondary | ICD-10-CM

## 2018-09-08 DIAGNOSIS — G959 Disease of spinal cord, unspecified: Secondary | ICD-10-CM

## 2018-09-08 DIAGNOSIS — Z1621 Resistance to vancomycin: Secondary | ICD-10-CM

## 2018-09-08 DIAGNOSIS — B965 Pseudomonas (aeruginosa) (mallei) (pseudomallei) as the cause of diseases classified elsewhere: Secondary | ICD-10-CM

## 2018-09-08 DIAGNOSIS — Z978 Presence of other specified devices: Secondary | ICD-10-CM

## 2018-09-08 DIAGNOSIS — Z87442 Personal history of urinary calculi: Secondary | ICD-10-CM

## 2018-09-08 LAB — CBC
HCT: 27.5 % — ABNORMAL LOW (ref 39.0–52.0)
HEMOGLOBIN: 8.2 g/dL — AB (ref 13.0–17.0)
MCH: 28.1 pg (ref 26.0–34.0)
MCHC: 29.8 g/dL — ABNORMAL LOW (ref 30.0–36.0)
MCV: 94.2 fL (ref 80.0–100.0)
Platelets: 285 10*3/uL (ref 150–400)
RBC: 2.92 MIL/uL — AB (ref 4.22–5.81)
RDW: 17.6 % — ABNORMAL HIGH (ref 11.5–15.5)
WBC: 7.7 10*3/uL (ref 4.0–10.5)
nRBC: 0 % (ref 0.0–0.2)

## 2018-09-08 LAB — BASIC METABOLIC PANEL
ANION GAP: 5 (ref 5–15)
BUN: 10 mg/dL (ref 8–23)
CHLORIDE: 101 mmol/L (ref 98–111)
CO2: 29 mmol/L (ref 22–32)
Calcium: 9.8 mg/dL (ref 8.9–10.3)
Creatinine, Ser: 1.13 mg/dL (ref 0.61–1.24)
GFR calc Af Amer: >60 mL/min (ref >60)
GLUCOSE: 85 mg/dL (ref 70–99)
POTASSIUM: 3.5 mmol/L (ref 3.5–5.1)
Sodium: 135 mmol/L (ref 135–145)

## 2018-09-08 MED ORDER — SODIUM CHLORIDE 0.9 % IV SOLN
INTRAVENOUS | Status: AC
  Administered 2018-09-08: 14:00:00 via INTRAVENOUS

## 2018-09-08 MED ORDER — LOPERAMIDE HCL 2 MG PO CAPS
2.0000 mg | ORAL_CAPSULE | Freq: Two times a day (BID) | ORAL | Status: DC
  Administered 2018-09-08 – 2018-09-09 (×4): 2 mg via ORAL
  Filled 2018-09-08 (×4): qty 1

## 2018-09-08 NOTE — Progress Notes (Signed)
Referring Physician(s): Dr Chinita Pester  Supervising Physician: Daryll Brod  Patient Status:  [facility] - In-pt  Chief Complaint:  11/21 procedure- IMPRESSION: Successful decompression of the left flank subcutaneous fluid collection and the left retroperitoneal fluid collection by placing a new subcutaneous drain and up sizing / repositioning the left retroperitoneal drain. The old subcutaneous drain was completely removed. Greater than 900 mL of thick yellow fluid was removed from the collections. Fluid was sent for culture and triglyceride to exclude a chylous leak.  Subjective:  Better Stronger Denies pain OP to bag purulent OP to JP bloody    Allergies: Patient has no known allergies.  Medications: Prior to Admission medications   Medication Sig Start Date End Date Taking? Authorizing Provider  acetaminophen (TYLENOL) 325 MG tablet Take 650 mg by mouth 3 (three) times daily.   Yes [provider]  antiseptic oral rinse (BIOTENE) LIQD 15 mLs by Mouth Rinse route 3 (three) times daily.   Yes [provider]  piperacillin-tazobactam (ZOSYN) 3.375 GM/50ML IVPB Inject 50 mLs (3.375 g total) into the vein every 8 (eight) hours for 26 days. 08/20/18 09/15/18 Yes Rehman, Areeg N, DO  vitamin B-12 (CYANOCOBALAMIN) 1000 MCG tablet Take 2 tablets (2,000 mcg total) by mouth daily. 09/26/17  Yes Hongalgi, Lenis Dickinson, MD  amoxicillin-clavulanate (AUGMENTIN) 875-125 MG tablet Take 1 tablet by mouth 2 (two) times daily. 09/16/18   Rehman, Areeg N, DO  polyethylene glycol (MIRALAX) packet Take 17 g by mouth 2 (two) times daily. Patient not taking: Reported on 08/02/2018 09/26/17   Modena Jansky, MD  senna (SENOKOT) 8.6 MG TABS tablet Take 2 tablets (17.2 mg total) by mouth at bedtime as needed for mild constipation or moderate constipation. Patient not taking: Reported on 08/02/2018 09/26/17   Modena Jansky, MD     Vital Signs: BP (!) 102/58 (BP Location: Left  Arm)   Pulse 65   Temp (!) 97.4 F (36.3 C) (Oral)   Resp 18   Ht 5\' 11"  (1.803 m)   Wt 221 lb 12.5 oz (100.6 kg)   SpO2 95%   BMI 30.93 kg/m   Physical Exam  Abdominal: Soft.  Neurological: He is alert.  Skin: Skin is warm and dry.  Sites of drains are clean and dry Sl tender JP OP bloody Drain to bag purulent  Left flank abscess: VANCOMYCIN RESISTANT ENTEROCOCCUS ISOLATED   Organism ID, Bacteria PSEUDOMONAS AERUGINOSA       Imaging: Ct Abdomen Pelvis Wo Contrast  Result Date: 09/04/2018 CLINICAL DATA:  Abdominal distention. EXAM: CT ABDOMEN AND PELVIS WITHOUT CONTRAST TECHNIQUE: Multidetector CT imaging of the abdomen and pelvis was performed following the standard protocol without IV contrast. COMPARISON:  08/15/2018 FINDINGS: Lower chest: Chronic volume loss and opacity in the right lower lobe. Atherosclerosis that is extensive. Hepatobiliary: No focal liver abnormality.Large peripherally calcified gallstone separate from the gallbladder wall. No inflammation. Pancreas: Unremarkable. Spleen: Unremarkable. Adrenals/Urinary Tract: Negative adrenals. Right internal ureteral stent with distal retention loop formed at the level of the ureter. Moderate right hydronephrosis with multiple intrarenal calculi, the largest casting calices and measuring up to 9 mm. Left nephrectomy. Decompressed bladder with suprapubic catheter. Stomach/Bowel: No obstruction. No inflammatory changes. Fluid levels reach the distal colon. Vascular/Lymphatic: Extensive atherosclerosis. No mass or adenopathy. Reproductive:Negative Other: Known left retroperitoneal collection containing primarily fluid but also gas, extending from the inferior aspect of the diaphragm to the left iliac fossa, intimately associated with the psoas. The collection is 17 cm  in craniocaudal span by 11 cm in maximal diameter. Despite an indwelling catheter this collection has increased in size. Subcutaneous collection in the left flank  with indwelling catheter has also increased in size, now 19 cm in maximal span by up to 6 cm in diameter on axial slices. Anti dependent fat density is seen within this collection which is otherwise cystic density. Musculoskeletal: Advanced spinal degeneration with multilevel thoracic laminectomy. Generalized spondylosis and facet degeneration with severe spinal stenosis at L4-5. IMPRESSION: 1. Left retroperitoneum and left flank subcutaneous fluid collections have increased in size from 08/15/2018, despite well-positioned drainage catheters. The subcutaneous collection has layering fat density, is the drainage chylous? 2. Stable right hydronephrosis with indwelling ureteral stent. The lower retention loop is still formed at the level of the lower ureter. Electronically Signed   By: Monte Fantasia M.D.   On: 09/04/2018 13:58   Ir Sinus/fist Tube Chk-non Gi  Result Date: 09/05/2018 INDICATION: 72 year old with history of left nephrectomy and persistent left flank and retroperitoneum fluid collections despite having drains within the left retroperitoneum and the left back subcutaneous tissues. Patient presents for additional drainage of the left flank and retroperitoneal fluid collections. EXAM: DRAIN INJECTIONS X 2 EXCHANGE AND REPOSITIONING OF ONE EXISTING DRAIN PLACEMENT OF NEW SUBCUTANEOUS DRAIN WITH ULTRASOUND GUIDANCE MEDICATIONS: The patient is currently admitted to the hospital and receiving intravenous antibiotics. The antibiotics were administered within an appropriate time frame prior to the initiation of the procedure. ANESTHESIA/SEDATION: Fentanyl 150 mcg IV; Versed 4 mg IV Moderate Sedation Time:  47 minutes The patient was continuously monitored during the procedure by the interventional radiology nurse under my direct supervision. CONTRAST:  15 mL [id number] COMPLICATIONS: None immediate. FLUOROSCOPY TIME:  2 minutes, 6 seconds; 4 mGy PROCEDURE: Informed written consent was obtained from the  patient after a thorough discussion of the procedural risks, benefits and alternatives. All questions were addressed. A timeout was performed prior to the initiation of the procedure. Patient was placed prone. Left flank was evaluated with ultrasound. The existing drains and left flank were prepped and draped in sterile fashion. Maximal barrier sterile technique was utilized including caps, mask, sterile gowns, sterile gloves, sterile drape, hand hygiene and skin antiseptic. Skin was anesthetized with 1% lidocaine. Using ultrasound guidance, a Yueh catheter was directed into the large subcutaneous fluid collection and thick yellow fluid was aspirated. Stiff Amplatz wire was placed and the tract was dilated to accommodate a 12 [place] drain. Approximately 250 mL of thick yellow fluid was removed from this subcutaneous space. Aspiration of the fluid required extensive irrigation and catheter manipulation. Attention was directed to the existing drains. The drain in the left retroperitoneum was injected with contrast. The drain was not aspirating well. This drain was cut and removed over an Amplatz wire and a new 14 French drain was advanced into the retroperitoneal fluid collection. A large amount of thick yellow fluid was removed from the retroperitoneal collection. The collection required extensive irrigation and manipulation with the tube in order to decompress the collection. The old superficial drain was not aspirating and it was difficult to flush during the drain injection. Contrast appeared to be refluxing around the catheter. This catheter was completely removed. After removal of this catheter, purulent fluid was leaking out of the old drain site. Additional fluid was removed from the subcutaneous tissues through the newly placed drain. After extensive irrigation and aspiration from the drains, greater than 900 mL of thick yellow fluid was removed. The 2 drains were sutured  to skin and attached to suction bulb.  FINDINGS: Massive amount of thick yellow fluid in the left flank subcutaneous tissues and left retroperitoneum. The subcutaneous and retroperitoneal collections were successfully decompressed with placement of a new drain in the subcutaneous tissues and up sizing of the left retroperitoneal drain. Decompression of these collections required extensive irrigation and drain manipulation. The superficial and retroperitoneal collections both appeared to be decompressed at the end of the procedure based on ultrasound. IMPRESSION: Successful decompression of the left flank subcutaneous fluid collection and the left retroperitoneal fluid collection by placing a new subcutaneous drain and up sizing / repositioning the left retroperitoneal drain. The old subcutaneous drain was completely removed. Greater than 900 mL of thick yellow fluid was removed from the collections. Fluid was sent for culture and triglyceride to exclude a chylous leak. Electronically Signed   By: Markus Daft M.D.   On: 09/05/2018 17:26   Ir Sinus/fist Tube Chk-non Gi  Result Date: 09/05/2018 INDICATION: 72 year old with history of left nephrectomy and persistent left flank and retroperitoneum fluid collections despite having drains within the left retroperitoneum and the left back subcutaneous tissues. Patient presents for additional drainage of the left flank and retroperitoneal fluid collections. EXAM: DRAIN INJECTIONS X 2 EXCHANGE AND REPOSITIONING OF ONE EXISTING DRAIN PLACEMENT OF NEW SUBCUTANEOUS DRAIN WITH ULTRASOUND GUIDANCE MEDICATIONS: The patient is currently admitted to the hospital and receiving intravenous antibiotics. The antibiotics were administered within an appropriate time frame prior to the initiation of the procedure. ANESTHESIA/SEDATION: Fentanyl 150 mcg IV; Versed 4 mg IV Moderate Sedation Time:  47 minutes The patient was continuously monitored during the procedure by the interventional radiology nurse under my direct  supervision. CONTRAST:  15 mL [id number] COMPLICATIONS: None immediate. FLUOROSCOPY TIME:  2 minutes, 6 seconds; 4 mGy PROCEDURE: Informed written consent was obtained from the patient after a thorough discussion of the procedural risks, benefits and alternatives. All questions were addressed. A timeout was performed prior to the initiation of the procedure. Patient was placed prone. Left flank was evaluated with ultrasound. The existing drains and left flank were prepped and draped in sterile fashion. Maximal barrier sterile technique was utilized including caps, mask, sterile gowns, sterile gloves, sterile drape, hand hygiene and skin antiseptic. Skin was anesthetized with 1% lidocaine. Using ultrasound guidance, a Yueh catheter was directed into the large subcutaneous fluid collection and thick yellow fluid was aspirated. Stiff Amplatz wire was placed and the tract was dilated to accommodate a 12 [place] drain. Approximately 250 mL of thick yellow fluid was removed from this subcutaneous space. Aspiration of the fluid required extensive irrigation and catheter manipulation. Attention was directed to the existing drains. The drain in the left retroperitoneum was injected with contrast. The drain was not aspirating well. This drain was cut and removed over an Amplatz wire and a new 14 French drain was advanced into the retroperitoneal fluid collection. A large amount of thick yellow fluid was removed from the retroperitoneal collection. The collection required extensive irrigation and manipulation with the tube in order to decompress the collection. The old superficial drain was not aspirating and it was difficult to flush during the drain injection. Contrast appeared to be refluxing around the catheter. This catheter was completely removed. After removal of this catheter, purulent fluid was leaking out of the old drain site. Additional fluid was removed from the subcutaneous tissues through the newly placed drain.  After extensive irrigation and aspiration from the drains, greater than 900 mL of thick yellow fluid  was removed. The 2 drains were sutured to skin and attached to suction bulb. FINDINGS: Massive amount of thick yellow fluid in the left flank subcutaneous tissues and left retroperitoneum. The subcutaneous and retroperitoneal collections were successfully decompressed with placement of a new drain in the subcutaneous tissues and up sizing of the left retroperitoneal drain. Decompression of these collections required extensive irrigation and drain manipulation. The superficial and retroperitoneal collections both appeared to be decompressed at the end of the procedure based on ultrasound. IMPRESSION: Successful decompression of the left flank subcutaneous fluid collection and the left retroperitoneal fluid collection by placing a new subcutaneous drain and up sizing / repositioning the left retroperitoneal drain. The old subcutaneous drain was completely removed. Greater than 900 mL of thick yellow fluid was removed from the collections. Fluid was sent for culture and triglyceride to exclude a chylous leak. Electronically Signed   By: Markus Daft M.D.   On: 09/05/2018 17:26   Ir Catheter Tube Change  Result Date: 09/05/2018 INDICATION: 72 year old with history of left nephrectomy and persistent left flank and retroperitoneum fluid collections despite having drains within the left retroperitoneum and the left back subcutaneous tissues. Patient presents for additional drainage of the left flank and retroperitoneal fluid collections. EXAM: DRAIN INJECTIONS X 2 EXCHANGE AND REPOSITIONING OF ONE EXISTING DRAIN PLACEMENT OF NEW SUBCUTANEOUS DRAIN WITH ULTRASOUND GUIDANCE MEDICATIONS: The patient is currently admitted to the hospital and receiving intravenous antibiotics. The antibiotics were administered within an appropriate time frame prior to the initiation of the procedure. ANESTHESIA/SEDATION: Fentanyl 150 mcg IV;  Versed 4 mg IV Moderate Sedation Time:  47 minutes The patient was continuously monitored during the procedure by the interventional radiology nurse under my direct supervision. CONTRAST:  15 mL [id number] COMPLICATIONS: None immediate. FLUOROSCOPY TIME:  2 minutes, 6 seconds; 4 mGy PROCEDURE: Informed written consent was obtained from the patient after a thorough discussion of the procedural risks, benefits and alternatives. All questions were addressed. A timeout was performed prior to the initiation of the procedure. Patient was placed prone. Left flank was evaluated with ultrasound. The existing drains and left flank were prepped and draped in sterile fashion. Maximal barrier sterile technique was utilized including caps, mask, sterile gowns, sterile gloves, sterile drape, hand hygiene and skin antiseptic. Skin was anesthetized with 1% lidocaine. Using ultrasound guidance, a Yueh catheter was directed into the large subcutaneous fluid collection and thick yellow fluid was aspirated. Stiff Amplatz wire was placed and the tract was dilated to accommodate a 12 [place] drain. Approximately 250 mL of thick yellow fluid was removed from this subcutaneous space. Aspiration of the fluid required extensive irrigation and catheter manipulation. Attention was directed to the existing drains. The drain in the left retroperitoneum was injected with contrast. The drain was not aspirating well. This drain was cut and removed over an Amplatz wire and a new 14 French drain was advanced into the retroperitoneal fluid collection. A large amount of thick yellow fluid was removed from the retroperitoneal collection. The collection required extensive irrigation and manipulation with the tube in order to decompress the collection. The old superficial drain was not aspirating and it was difficult to flush during the drain injection. Contrast appeared to be refluxing around the catheter. This catheter was completely removed. After  removal of this catheter, purulent fluid was leaking out of the old drain site. Additional fluid was removed from the subcutaneous tissues through the newly placed drain. After extensive irrigation and aspiration from the drains, greater than 900  mL of thick yellow fluid was removed. The 2 drains were sutured to skin and attached to suction bulb. FINDINGS: Massive amount of thick yellow fluid in the left flank subcutaneous tissues and left retroperitoneum. The subcutaneous and retroperitoneal collections were successfully decompressed with placement of a new drain in the subcutaneous tissues and up sizing of the left retroperitoneal drain. Decompression of these collections required extensive irrigation and drain manipulation. The superficial and retroperitoneal collections both appeared to be decompressed at the end of the procedure based on ultrasound. IMPRESSION: Successful decompression of the left flank subcutaneous fluid collection and the left retroperitoneal fluid collection by placing a new subcutaneous drain and up sizing / repositioning the left retroperitoneal drain. The old subcutaneous drain was completely removed. Greater than 900 mL of thick yellow fluid was removed from the collections. Fluid was sent for culture and triglyceride to exclude a chylous leak. Electronically Signed   By: Markus Daft M.D.   On: 09/05/2018 17:26   Ir Image Guided Drainage Percut Cath  Peritoneal Retroperit  Result Date: 09/05/2018 INDICATION: 72 year old with history of left nephrectomy and persistent left flank and retroperitoneum fluid collections despite having drains within the left retroperitoneum and the left back subcutaneous tissues. Patient presents for additional drainage of the left flank and retroperitoneal fluid collections. EXAM: DRAIN INJECTIONS X 2 EXCHANGE AND REPOSITIONING OF ONE EXISTING DRAIN PLACEMENT OF NEW SUBCUTANEOUS DRAIN WITH ULTRASOUND GUIDANCE MEDICATIONS: The patient is currently admitted  to the hospital and receiving intravenous antibiotics. The antibiotics were administered within an appropriate time frame prior to the initiation of the procedure. ANESTHESIA/SEDATION: Fentanyl 150 mcg IV; Versed 4 mg IV Moderate Sedation Time:  47 minutes The patient was continuously monitored during the procedure by the interventional radiology nurse under my direct supervision. CONTRAST:  15 mL [id number] COMPLICATIONS: None immediate. FLUOROSCOPY TIME:  2 minutes, 6 seconds; 4 mGy PROCEDURE: Informed written consent was obtained from the patient after a thorough discussion of the procedural risks, benefits and alternatives. All questions were addressed. A timeout was performed prior to the initiation of the procedure. Patient was placed prone. Left flank was evaluated with ultrasound. The existing drains and left flank were prepped and draped in sterile fashion. Maximal barrier sterile technique was utilized including caps, mask, sterile gowns, sterile gloves, sterile drape, hand hygiene and skin antiseptic. Skin was anesthetized with 1% lidocaine. Using ultrasound guidance, a Yueh catheter was directed into the large subcutaneous fluid collection and thick yellow fluid was aspirated. Stiff Amplatz wire was placed and the tract was dilated to accommodate a 12 [place] drain. Approximately 250 mL of thick yellow fluid was removed from this subcutaneous space. Aspiration of the fluid required extensive irrigation and catheter manipulation. Attention was directed to the existing drains. The drain in the left retroperitoneum was injected with contrast. The drain was not aspirating well. This drain was cut and removed over an Amplatz wire and a new 14 French drain was advanced into the retroperitoneal fluid collection. A large amount of thick yellow fluid was removed from the retroperitoneal collection. The collection required extensive irrigation and manipulation with the tube in order to decompress the collection.  The old superficial drain was not aspirating and it was difficult to flush during the drain injection. Contrast appeared to be refluxing around the catheter. This catheter was completely removed. After removal of this catheter, purulent fluid was leaking out of the old drain site. Additional fluid was removed from the subcutaneous tissues through the newly placed drain. After  extensive irrigation and aspiration from the drains, greater than 900 mL of thick yellow fluid was removed. The 2 drains were sutured to skin and attached to suction bulb. FINDINGS: Massive amount of thick yellow fluid in the left flank subcutaneous tissues and left retroperitoneum. The subcutaneous and retroperitoneal collections were successfully decompressed with placement of a new drain in the subcutaneous tissues and up sizing of the left retroperitoneal drain. Decompression of these collections required extensive irrigation and drain manipulation. The superficial and retroperitoneal collections both appeared to be decompressed at the end of the procedure based on ultrasound. IMPRESSION: Successful decompression of the left flank subcutaneous fluid collection and the left retroperitoneal fluid collection by placing a new subcutaneous drain and up sizing / repositioning the left retroperitoneal drain. The old subcutaneous drain was completely removed. Greater than 900 mL of thick yellow fluid was removed from the collections. Fluid was sent for culture and triglyceride to exclude a chylous leak. Electronically Signed   By: Markus Daft M.D.   On: 09/05/2018 17:26    Labs:  CBC: Recent Labs    09/04/18 1219 09/05/18 0525 09/06/18 0520 09/08/18 0644  WBC 10.2 11.4* 9.3 7.7  HGB 9.0* 7.6* 8.2* 8.2*  HCT 30.5* 26.0* 28.1* 27.5*  PLT 243 238 274 285    COAGS: Recent Labs    08/03/18 0603 09/05/18 0525  INR 1.36 1.36  APTT 43*  --     BMP: Recent Labs    09/04/18 1219 09/05/18 0525 09/06/18 0520 09/08/18 0644  NA  138 139 139 135  K 3.0* 2.9* 3.9 3.5  CL 106 111 110 101  CO2 26 24 25 29   GLUCOSE 119* 63* 74 85  BUN 10 8 8 10   CALCIUM 9.2 8.5* 9.3 9.8  CREATININE 1.37* 1.24 1.21 1.13  GFRNONAA 49* 56* 57* >60  GFRAA 57* >60 >60 >60    LIVER FUNCTION TESTS: Recent Labs    09/21/17 0743 08/02/18 1921 08/11/18 1246 09/04/18 1219  BILITOT 1.1 0.5 0.3 0.3  AST 39 20 13* 17  ALT 19 15 8 8   ALKPHOS 70 71 56 84  PROT 7.4 7.1 5.9* 5.8*  ALBUMIN 2.7* 1.9* 1.7* 1.3*    Assessment and Plan:  Post 02/2018 left nephrectomy Left flank abscesses Drains intact Will follow   Electronically Signed: Lita Flynn A, PA-C 09/08/2018, 11:03 AM   I spent a total of 15 Minutes at the the patient's bedside AND on the patient's hospital floor or unit, greater than 50% of which was counseling/coordinating care for left flank abscesses

## 2018-09-08 NOTE — Progress Notes (Signed)
   Subjective: Mr. Calixto states that he continues to have diarrhea but is very happy with having the rectal tube in place.  He also states that he continues to have a rash on the flexor surfaces of his arm but that this has not worsened since onset.  He does not report any other acute complaints including chest pain or shortness of breath.  Objective:  Vital signs in last 24 hours: Vitals:   09/07/18 1530 09/07/18 2149 09/07/18 2313 09/08/18 0300  BP: (!) 98/51 (!) 100/53 104/66 101/60  Pulse: 64 62 66 69  Resp:  20 18   Temp: 98.3 F (36.8 C) 98 F (36.7 C) 98 F (36.7 C) 97.6 F (36.4 C)  TempSrc: Oral Oral Oral Oral  SpO2: 96% 97% 99% 94%  Weight:      Height:       Physical Exam  Constitutional: He is well-developed, well-nourished, and in no distress.  HENT:  Head: Normocephalic and atraumatic.  Eyes: EOM are normal.  Neck: Normal range of motion.  Cardiovascular: Normal rate, regular rhythm and normal heart sounds.  Pulmonary/Chest: Effort normal and breath sounds normal.  Abdominal: Soft. Bowel sounds are normal. He exhibits no distension. There is no tenderness.  Genitourinary:  Genitourinary Comments: Suprapubic catheter in place.   Musculoskeletal:  2 drains in place at the left lateral flank with serous fluid present.   Neurological: He is alert.  Nursing note and vitals reviewed.   Assessment/Plan:  Principal Problem:   Psoas abscess, left (HCC) Active Problems:   Malnutrition of moderate degree   Retroperitoneal abscess [facility])  Mr. Baudoin presentedwith worsening left retroperitoneal and left flank subcutaneous abscesses in the setting of an inadequate JP line drainage now s/pupsizing/exchange of drains. Culture grew enterococcus (VRE but amp sensitive) and pseudomonas (susceptibilities pending). He is currently on piperacillin-tazobactam antibiotic therapy.  We will add linezolid to cover VRE.  Left retroperitoneal and flank subcutaneous abscesses: 1.  Appreciate ID recommendations 2. Continue to Piperacillin-tazobactam3.375 g every 8 hours 3. Start Linezolid 600 mg every 12 hours 4.Repeat CT on 09/09/2018 5. Follow Susceptibilities  Acute kidney injury:Creatinineimproving now1.13(baseline 0.8-1.0).Likely secondary to decreased p.o. Intake. 1. Continueto monitor  Diarrhea:He has not had Imodium for 2 to 3 days.  We will schedule Imodium 2 mg twice daily starting today. 1. Start Imodium 2 mg twice daily 2.  Continue maintenance fluids today  Scattered MacularRash:Likely a contact dermatitis.  1. Continue to monitor  Anemia of Chronic Disease: Hgb at baseline. 1. Continue to monitor  Dispo: Anticipated discharge in approximately 1-2 day(s) pending results of repeat CT abdomen and culture data.  Carroll Sage, MD 09/08/2018, 6:51 AM Pager: [phone]

## 2018-09-08 NOTE — Consult Note (Signed)
[place] for Infectious Disease    Date of Admission:  09/04/2018   Total days of antibiotics: 38 (zosyn, day 1 zyvox)               Reason for Consult: Psoas abscess    Referring Provider: Butcher/Prince/Helberg/Santos-Sanchez   Assessment: Psoas abscess (enlarging) Rash Diarrhea (chronic per notes, 48 h per pt)  Plan: 1. Continue zyvox and zosyn 2. Await sensi of pseudomonas 3. Query need for eval of fistula with increasing size of his abscess 4. Agree with not checking for C diff- he is afebrile, WBC normal.  5. Follow his rash for worsening (contact/heat on his back?)  Comment Amp susceptibility does not accurately predict piperacillin susceptibility for enterococci. His pseudomonas precludes use of unasyn.   Thank you so much for this interesting consult,  Principal Problem:   Psoas abscess, left ([place]) Active Problems:   Malnutrition of moderate degree   Retroperitoneal abscess ([place])   . feeding supplement (ENSURE ENLIVE)  237 mL Oral BID BM  . multivitamin with minerals  1 tablet Oral Daily  . sodium chloride flush  10 mL Intracatheter Q8H  . sodium chloride flush  10-40 mL Intracatheter Q12H  . vitamin B-12  2,000 mcg Oral Daily    HPI: Jerry [name] is a 72 y.o. male with hx of Prostate CA, L nephrectomy, cervical myelopathy, neurogenic bladder, staghorn calculi, and supra-pubic catheter, adm 05-2014 with MSSA bacteremia. He refused TEE and received ancef for 6 weeks.  He returned to hospital 07-2018 with L retroperitoneal-psoas abscess 6.9 x 8.8 x 17 cm. He had IR drain placed and Cx grew K pneumo, Bacteroides, Prevotella, Viridans strep, and Proteus.   He was treated with unasyn in hospital then changed to zosyn as outpt (SNF).  He returns to hospital on 11-20 after his repeat CT showed increase in size in his abscess. He had new drain placed 11-21 (900 cc of thick yellow fluid).  The Cx of this fluid has returned Enterococcus (VRE but amp  sensitive) and Pseudomonas (sensi pending) He was continued on zosyn.  He has been afebrile and his WBC peaked at 11 on adm, now normal.   Review of Systems: Review of Systems  Constitutional: Negative for chills and fever.  Gastrointestinal: Positive for diarrhea. Negative for constipation.  Genitourinary: Negative for flank pain.  Please see HPI. All other systems reviewed and negative.   Past Medical History:  Diagnosis Date  . Acute kidney injury ([place]) 09/21/2017  . Arthritis    "hands, knees, back, legs" (06/10/2014)  . Atonic neurogenic bladder 06/30/2016  . Bilateral leg weakness 10/25/2012  . Cervical myelopathy ([place])   . Chronic diastolic congestive heart failure ([place]) 06/23/2014  . Coma ([place]) 1968   "for 2 wks"  . Fall at home 10/25/2012  . Generalized weakness 06/23/2014  . IDCVUDTH(438.8)    "monthly" (06/10/2014)  . Lower urinary tract infectious disease 10/25/2012  . Malaria   . Obesity 10/25/2012  . Presence of suprapubic catheter ([place]) 06/17/2014   Due to prostate cancer   . Prostate cancer ([place])   . PTSD (post-traumatic stress disorder)    "I'm all over that now"  . Rhabdomyolysis 09/21/2017  . Sleep apnea 10/25/2012  . Staghorn calculus 06/30/2016  . Staphylococcus aureus bacteremia 06/30/2014  . Urinary incontinence, functional 10/25/2012  . UTI (urinary tract infection) 09/2017    Social History   Tobacco Use  . Smoking status: Never Smoker  .  Smokeless tobacco: Never Used  . Tobacco comment: "smoked cigarettes  for ~ 1 wk in [place]"  Substance Use Topics  . Alcohol use: No  . Drug use: No    Family History  Problem Relation Age of Onset  . Heart disease Mother   . Dementia Mother   . Heart disease Father   . Dementia Father   . Alcohol abuse Father      Medications:  Scheduled: . feeding supplement (ENSURE ENLIVE)  237 mL Oral BID BM  . multivitamin with minerals  1 tablet Oral Daily  . sodium chloride flush  10 mL Intracatheter Q8H  . sodium  chloride flush  10-40 mL Intracatheter Q12H  . vitamin B-12  2,000 mcg Oral Daily    Abtx:  Anti-infectives (From admission, onward)   Start     Dose/Rate Route Frequency Ordered Stop   09/07/18 2200  linezolid (ZYVOX) IVPB 600 mg     600 mg 300 mL/hr over 60 Minutes Intravenous Every 12 hours 09/07/18 1550     09/04/18 1634  piperacillin-tazobactam (ZOSYN) IVPB 3.375 g     3.375 g 12.5 mL/hr over 240 Minutes Intravenous Every 8 hours 09/04/18 1634     09/04/18 1545  piperacillin-tazobactam (ZOSYN) IVPB 3.375 g  Status:  Discontinued     3.375 g 12.5 mL/hr over 240 Minutes Intravenous Every 8 hours 09/04/18 1537 09/04/18 1634        OBJECTIVE: Blood pressure (!) 102/58, pulse 65, temperature (!) 97.4 F (36.3 C), temperature source Oral, resp. rate 18, height _0  (1.803 m), weight 100.6 kg, SpO2 95 %.  Physical Exam  Constitutional: No distress.  HENT:  Mouth/Throat: No oropharyngeal exudate.  Eyes: Pupils are equal, round, and reactive to light. EOM are normal.  Neck: Neck supple.  Cardiovascular: Normal rate, regular rhythm and normal heart sounds.  Pulmonary/Chest: Effort normal and breath sounds normal.      Abdominal: Soft. Bowel sounds are normal. He exhibits no distension. There is no tenderness.    Musculoskeletal: He exhibits no edema.  Lymphadenopathy:    He has no cervical adenopathy.  Neurological: He is alert.  Skin: He is not diaphoretic.       Lab Results Results for orders placed or performed during the hospital encounter of 09/04/18 (from the past 48 hour(s))  Basic metabolic panel     Status: None   Collection Time: 09/08/18  6:44 AM  Result Value Ref Range   Sodium 135 135 - 145 mmol/L   Potassium 3.5 3.5 - 5.1 mmol/L   Chloride 101 98 - 111 mmol/L   CO2 29 22 - 32 mmol/L   Glucose, Bld 85 70 - 99 mg/dL   BUN 10 8 - 23 mg/dL   Creatinine, Ser 1.13 0.61 - 1.24 mg/dL   Calcium 9.8 8.9 - 10.3 mg/dL   GFR calc non Af Amer >60 >60 mL/min     GFR calc Af Amer >60 >60 mL/min    Comment: (NOTE) The eGFR has been calculated using the CKD EPI equation. This calculation has not been validated in all clinical situations. eGFR's persistently <60 mL/min signify possible Chronic Kidney Disease.    Anion gap 5 5 - 15    Comment: Performed at [facility] [address] [place], [place] [address]  CBC     Status: Abnormal   Collection Time: 09/08/18  6:44 AM  Result Value Ref Range   WBC 7.7 4.0 - 10.5 K/uL  RBC 2.92 (L) 4.22 - 5.81 MIL/uL   Hemoglobin 8.2 (L) 13.0 - 17.0 g/dL   HCT 27.5 (L) 39.0 - 52.0 %   MCV 94.2 80.0 - 100.0 fL   MCH 28.1 26.0 - 34.0 pg   MCHC 29.8 (L) 30.0 - 36.0 g/dL   RDW 17.6 (H) 11.5 - 15.5 %   Platelets 285 150 - 400 K/uL   nRBC 0.0 0.0 - 0.2 %    Comment: Performed at [facility] [address], [place], [place] [address]      Component Value Date/Time   SDES ABSCESS LEFT FLANK 09/05/2018 1411   SPECREQUEST NONE 09/05/2018 1411   CULT  09/05/2018 1411    MODERATE VANCOMYCIN RESISTANT ENTEROCOCCUS ISOLATED RARE PSEUDOMONAS AERUGINOSA SUSCEPTIBILITIES TO FOLLOW HOLDING FOR POSSIBLE ANAEROBE Performed at [facility], [place] [address], [place], [place] [address]    REPTSTATUS PENDING 09/05/2018 1411   No results found. Recent Results (from the past 240 hour(s))  Blood culture (routine x 2)     Status: None (Preliminary result)   Collection Time: 09/04/18 12:19 PM  Result Value Ref Range Status   Specimen Description BLOOD LEFT ANTECUBITAL  Final   Special Requests   Final    BOTTLES DRAWN AEROBIC AND ANAEROBIC Blood Culture results may not be optimal due to an excessive volume of blood received in culture bottles   Culture   Final    NO GROWTH 3 DAYS Performed at [facility] [address] [place], [place] [address]    Report Status PENDING  Incomplete  Urine culture     Status: None   Collection Time: 09/04/18 12:59 PM  Result Value Ref Range Status    Specimen Description URINE, RANDOM  Final   Special Requests NONE  Final   Culture   Final    NO GROWTH Performed at [facility], [address] [place], [place] [address]    Report Status 09/05/2018 FINAL  Final  Blood culture (routine x 2)     Status: None (Preliminary result)   Collection Time: 09/04/18  4:31 PM  Result Value Ref Range Status   Specimen Description BLOOD LEFT FOREARM  Final   Special Requests   Final    BOTTLES DRAWN AEROBIC AND ANAEROBIC Blood Culture adequate volume   Culture   Final    NO GROWTH 3 DAYS Performed at [facility], [place] [address], [place], [place] [address]    Report Status PENDING  Incomplete  Aerobic/Anaerobic Culture (surgical/deep wound)     Status: None (Preliminary result)   Collection Time: 09/05/18  2:11 PM  Result Value Ref Range Status   Specimen Description ABSCESS LEFT FLANK  Final   Special Requests NONE  Final   Gram Stain   Final    FEW WBC PRESENT, PREDOMINANTLY PMN FEW GRAM POSITIVE COCCI IN PAIRS IN CLUSTERS FEW GRAM POSITIVE RODS FEW GRAM NEGATIVE RODS    Culture   Final    MODERATE VANCOMYCIN RESISTANT ENTEROCOCCUS ISOLATED RARE PSEUDOMONAS AERUGINOSA SUSCEPTIBILITIES TO FOLLOW HOLDING FOR POSSIBLE ANAEROBE Performed at [facility], [place] [address], [place], [place] [address]    Report Status PENDING  Incomplete   Organism ID, Bacteria VANCOMYCIN RESISTANT ENTEROCOCCUS ISOLATED  Final      Susceptibility   Vancomycin resistant enterococcus isolated - MIC*    AMPICILLIN <=2 SENSITIVE Sensitive     VANCOMYCIN >=32 RESISTANT Resistant     GENTAMICIN SYNERGY  SENSITIVE Sensitive     LINEZOLID 2 SENSITIVE Sensitive     * MODERATE VANCOMYCIN RESISTANT ENTEROCOCCUS ISOLATED    Microbiology: Recent Results (from the past 240 hour(s))  Blood culture (routine x 2)     Status: None (Preliminary result)   Collection Time: 09/04/18 12:19 PM  Result Value Ref Range Status   Specimen Description BLOOD  LEFT ANTECUBITAL  Final   Special Requests   Final    BOTTLES DRAWN AEROBIC AND ANAEROBIC Blood Culture results may not be optimal due to an excessive volume of blood received in culture bottles   Culture   Final    NO GROWTH 3 DAYS Performed at [facility] [address], [place], [place] [address]    Report Status PENDING  Incomplete  Urine culture     Status: None   Collection Time: 09/04/18 12:59 PM  Result Value Ref Range Status   Specimen Description URINE, RANDOM  Final   Special Requests NONE  Final   Culture   Final    NO GROWTH Performed at [facility], [address] [place], [place] [address]    Report Status 09/05/2018 FINAL  Final  Blood culture (routine x 2)     Status: None (Preliminary result)   Collection Time: 09/04/18  4:31 PM  Result Value Ref Range Status   Specimen Description BLOOD LEFT FOREARM  Final   Special Requests   Final    BOTTLES DRAWN AEROBIC AND ANAEROBIC Blood Culture adequate volume   Culture   Final    NO GROWTH 3 DAYS Performed at [facility], [place] [address], [place], [place] [address]    Report Status PENDING  Incomplete  Aerobic/Anaerobic Culture (surgical/deep wound)     Status: None (Preliminary result)   Collection Time: 09/05/18  2:11 PM  Result Value Ref Range Status   Specimen Description ABSCESS LEFT FLANK  Final   Special Requests NONE  Final   Gram Stain   Final    FEW WBC PRESENT, PREDOMINANTLY PMN FEW GRAM POSITIVE COCCI IN PAIRS IN CLUSTERS FEW GRAM POSITIVE RODS FEW GRAM NEGATIVE RODS    Culture   Final    MODERATE VANCOMYCIN RESISTANT ENTEROCOCCUS ISOLATED RARE PSEUDOMONAS AERUGINOSA SUSCEPTIBILITIES TO FOLLOW HOLDING FOR POSSIBLE ANAEROBE Performed at [facility], [place] [address] [place],  [address]    Report Status PENDING  Incomplete   Organism ID, Bacteria VANCOMYCIN RESISTANT ENTEROCOCCUS ISOLATED  Final      Susceptibility   Vancomycin resistant enterococcus isolated -  MIC*    AMPICILLIN <=2 SENSITIVE Sensitive     VANCOMYCIN >=32 RESISTANT Resistant     GENTAMICIN SYNERGY SENSITIVE Sensitive     LINEZOLID 2 SENSITIVE Sensitive     * MODERATE VANCOMYCIN RESISTANT ENTEROCOCCUS ISOLATED    Radiographs and labs were personally reviewed by me.   Jerry Rumpf, MD [facility] for [facility] [phone] 09/08/2018, 9:15 AM

## 2018-09-09 ENCOUNTER — Inpatient Hospital Stay (HOSPITAL_COMMUNITY): Payer: Medicare Other

## 2018-09-09 ENCOUNTER — Ambulatory Visit: Payer: PRIVATE HEALTH INSURANCE | Admitting: Family

## 2018-09-09 DIAGNOSIS — Z1621 Resistance to vancomycin: Secondary | ICD-10-CM

## 2018-09-09 DIAGNOSIS — A498 Other bacterial infections of unspecified site: Secondary | ICD-10-CM

## 2018-09-09 DIAGNOSIS — E440 Moderate protein-calorie malnutrition: Secondary | ICD-10-CM

## 2018-09-09 DIAGNOSIS — A491 Streptococcal infection, unspecified site: Secondary | ICD-10-CM

## 2018-09-09 LAB — BASIC METABOLIC PANEL
Anion gap: 3 — ABNORMAL LOW (ref 5–15)
BUN: 11 mg/dL (ref 8–23)
CHLORIDE: 103 mmol/L (ref 98–111)
CO2: 32 mmol/L (ref 22–32)
Calcium: 10.3 mg/dL (ref 8.9–10.3)
Creatinine, Ser: 1.3 mg/dL — ABNORMAL HIGH (ref 0.61–1.24)
GFR calc Af Amer: >60 mL/min (ref >60)
GFR calc non Af Amer: 52 mL/min — ABNORMAL LOW (ref >60)
Glucose, Bld: 86 mg/dL (ref 70–99)
Potassium: 3.6 mmol/L (ref 3.5–5.1)
Sodium: 138 mmol/L (ref 135–145)

## 2018-09-09 LAB — CBC WITH DIFFERENTIAL/PLATELET
Abs Immature Granulocytes: 0.05 10*3/uL (ref 0.00–0.07)
Basophils Absolute: 0 10*3/uL (ref 0.0–0.1)
Basophils Relative: 1 %
EOS ABS: 0.6 10*3/uL — AB (ref 0.0–0.5)
EOS PCT: 8 %
HCT: 29.2 % — ABNORMAL LOW (ref 39.0–52.0)
HEMOGLOBIN: 8.7 g/dL — AB (ref 13.0–17.0)
Immature Granulocytes: 1 %
LYMPHS PCT: 23 %
Lymphs Abs: 1.8 10*3/uL (ref 0.7–4.0)
MCH: 28.2 pg (ref 26.0–34.0)
MCHC: 29.8 g/dL — ABNORMAL LOW (ref 30.0–36.0)
MCV: 94.8 fL (ref 80.0–100.0)
MONO ABS: 0.7 10*3/uL (ref 0.1–1.0)
Monocytes Relative: 9 %
Neutro Abs: 4.6 10*3/uL (ref 1.7–7.7)
Neutrophils Relative %: 58 %
Platelets: 309 10*3/uL (ref 150–400)
RBC: 3.08 MIL/uL — ABNORMAL LOW (ref 4.22–5.81)
RDW: 18 % — ABNORMAL HIGH (ref 11.5–15.5)
WBC: 7.9 10*3/uL (ref 4.0–10.5)
nRBC: 0 % (ref 0.0–0.2)

## 2018-09-09 LAB — CULTURE, BLOOD (ROUTINE X 2)
CULTURE: NO GROWTH
Culture: NO GROWTH
Special Requests: ADEQUATE

## 2018-09-09 LAB — AEROBIC/ANAEROBIC CULTURE W GRAM STAIN (SURGICAL/DEEP WOUND)

## 2018-09-09 LAB — AEROBIC/ANAEROBIC CULTURE (SURGICAL/DEEP WOUND)

## 2018-09-09 MED ORDER — SODIUM CHLORIDE 0.9 % IV SOLN
2.0000 g | Freq: Four times a day (QID) | INTRAVENOUS | Status: DC
  Administered 2018-09-09 – 2018-09-10 (×2): 2 g via INTRAVENOUS
  Filled 2018-09-09 (×4): qty 2000

## 2018-09-09 NOTE — Progress Notes (Signed)
Internal Medicine Attending:   I saw and examined the patient. I reviewed the resident's note and I agree with the resident's findings and plan as documented in the resident's note.  Patient complains of persistent left-sided abdominal pain at the site of his drains but feels well otherwise.  Patient was initially admitted to the hospital with recurrent left retroperitoneal and left flank subcutaneous abscesses in the setting of an inadequate JP drainage now status post upsizing and exchange of drains.  Fluid culture grew enterococcus (VRE) and Pseudomonas.  ID follow-up and recommendations appreciated.  We will continue with the piperacillin/tazobactam for now.  Linezolid DC'd.  CT abdomen/pelvis done today without contrast which showed near resolution of his abscesses.  Will consider repeat CT abdomen with oral/rectal contrast to assess for fistula.  No further work-up at this time.  Will monitor closely.

## 2018-09-09 NOTE — Progress Notes (Signed)
[facility] for Infectious Disease  Date of Admission:  09/04/2018   Total days of antibiotics 5        Day 5  Piperacillin-tazobactam          Day 2 linezolid           Patient ID: Jerry [name] is a 72 y.o. M with  Principal Problem:   Psoas abscess, left ([place]) Active Problems:   Malnutrition of moderate degree   Retroperitoneal abscess ([place])   . feeding supplement (ENSURE ENLIVE)  237 mL Oral BID BM  . loperamide  2 mg Oral BID  . multivitamin with minerals  1 tablet Oral Daily  . sodium chloride flush  10 mL Intracatheter Q8H  . sodium chloride flush  10-40 mL Intracatheter Q12H  . vitamin B-12  2,000 mcg Oral Daily    SUBJECTIVE: Feeling OK. Felt that his abdomen was very hard last night but realized it was his hip bone 2/2 to malnourished state and weight loss. He has never noticed this before and it scared him. His wife is at the bedside wishing to know the results of CT scan.   No Known Allergies  OBJECTIVE: Vitals:   09/08/18 2005 09/09/18 0300 09/09/18 0800 09/09/18 1200  BP: 103/62 (!) 93/56 102/63 (!) 109/49  Pulse: 66 66 64 67  Resp: 18 16 20 20   Temp: (!) 97.5 F (36.4 C) (!) 97.4 F (36.3 C) 98.2 F (36.8 C) 98.4 F (36.9 C)  TempSrc: Oral Oral Oral   SpO2: 95% 93% 93% 95%  Weight:      Height:       Body mass index is 30.93 kg/m.  Physical Exam  Constitutional: He is oriented to person, place, and time. He appears well-developed.  Thin appearing man lying in bed. Wife present.   HENT:  Mouth/Throat: No oropharyngeal exudate.  Eyes: Pupils are equal, round, and reactive to light.  Cardiovascular: Normal rate.  Pulmonary/Chest: Effort normal. No respiratory distress.  Abdominal: Soft. He exhibits no distension. There is no tenderness.  Perc tube in place with yellow/white drainage.  Rectal tube in place.   Genitourinary:  Genitourinary Comments: Suprapubic catheter in place   Lymphadenopathy:    He has no cervical  adenopathy.  Neurological: He is alert and oriented to person, place, and time.  Vitals reviewed. RUE PICC line - clean/dry dressing. Insertion site w/o erythema, tenderness, drainage, cording or distal swelling of affected extremity   Lab Results Lab Results  Component Value Date   WBC 7.9 09/09/2018   HGB 8.7 (L) 09/09/2018   HCT 29.2 (L) 09/09/2018   MCV 94.8 09/09/2018   PLT 309 09/09/2018    Lab Results  Component Value Date   CREATININE 1.30 (H) 09/09/2018   BUN 11 09/09/2018   NA 138 09/09/2018   K 3.6 09/09/2018   CL 103 09/09/2018   CO2 32 09/09/2018    Lab Results  Component Value Date   ALT 8 09/04/2018   AST 17 09/04/2018   ALKPHOS 84 09/04/2018   BILITOT 0.3 09/04/2018     Microbiology: Recent Results (from the past 240 hour(s))  Blood culture (routine x 2)     Status: None   Collection Time: 09/04/18 12:19 PM  Result Value Ref Range Status   Specimen Description BLOOD LEFT ANTECUBITAL  Final   Special Requests   Final    BOTTLES DRAWN AEROBIC AND ANAEROBIC Blood Culture results may not be  optimal due to an excessive volume of blood received in culture bottles   Culture   Final    NO GROWTH 5 DAYS Performed at [facility], [place] [address], [place], [place] [address]    Report Status 09/09/2018 FINAL  Final  Urine culture     Status: None   Collection Time: 09/04/18 12:59 PM  Result Value Ref Range Status   Specimen Description URINE, RANDOM  Final   Special Requests NONE  Final   Culture   Final    NO GROWTH Performed at [facility], [place] [address] [place], [place] [address]    Report Status 09/05/2018 FINAL  Final  Blood culture (routine x 2)     Status: None   Collection Time: 09/04/18  4:31 PM  Result Value Ref Range Status   Specimen Description BLOOD LEFT FOREARM  Final   Special Requests   Final    BOTTLES DRAWN AEROBIC AND ANAEROBIC Blood Culture adequate volume   Culture   Final    NO GROWTH 5 DAYS Performed at [facility], [place] [address] [place], [place] [address]    Report Status 09/09/2018 FINAL  Final  Aerobic/Anaerobic Culture (surgical/deep wound)     Status: None (Preliminary result)   Collection Time: 09/05/18  2:11 PM  Result Value Ref Range Status   Specimen Description ABSCESS LEFT FLANK  Final   Special Requests NONE  Final   Gram Stain   Final    FEW WBC PRESENT, PREDOMINANTLY PMN FEW GRAM POSITIVE COCCI IN PAIRS IN CLUSTERS FEW GRAM POSITIVE RODS FEW GRAM NEGATIVE RODS    Culture   Final    MODERATE VANCOMYCIN RESISTANT ENTEROCOCCUS ISOLATED RARE PSEUDOMONAS AERUGINOSA HOLDING FOR POSSIBLE ANAEROBE    Report Status PENDING  Incomplete   Organism ID, Bacteria VANCOMYCIN RESISTANT ENTEROCOCCUS ISOLATED  Final   Organism ID, Bacteria PSEUDOMONAS AERUGINOSA  Final      Susceptibility   Pseudomonas aeruginosa - MIC*    CEFTAZIDIME 32 RESISTANT Resistant     CIPROFLOXACIN 1 SENSITIVE Sensitive     GENTAMICIN <=1 SENSITIVE Sensitive     IMIPENEM >=16 RESISTANT Resistant     PIP/TAZO 32 SENSITIVE Sensitive     CEFEPIME Value in next row Intermediate      INTERMEDIATEPerformed at [facility] [address] [place], [place] [address]    * RARE PSEUDOMONAS AERUGINOSA   Vancomycin resistant enterococcus isolated - MIC*    AMPICILLIN Value in next row Sensitive      INTERMEDIATEPerformed at [facility] [address], [place], [place] [address]    VANCOMYCIN Value in next row Resistant      INTERMEDIATEPerformed at [facility] [address], [place], [place] [address]    GENTAMICIN SYNERGY Value in next row Sensitive      INTERMEDIATEPerformed at [facility], [place] [address], [place], [place] [address]    LINEZOLID Value in next row Sensitive      INTERMEDIATEPerformed at [facility] [address], [place],  [address]    * MODERATE VANCOMYCIN RESISTANT ENTEROCOCCUS ISOLATED   ASSESSMENT & PLAN:  1. MDR Psoas / L retroperitoneal  Abscess = Drain was upsized/repositioned after failure to decompress. 900 cc thick yellow fluid was recovered 11/21 during procedure and now growing VRE (sensitive to amp) and Pseudomonas (R-cephalosporins, carbapenems and MIC32 to pip-tazo). Change linezolid to ampicillin. Continue pip-tazo >> will need careful  attention to determine length of therapy w/ concern over breeding more resistant organism. CT scan with significantly improved fluid collection (previously estimated 750cc now 120cc). 4-7 more days barring drainage subsides?   2. Recurrent Abscess = ?source of re-acumulation of fluid. Would CT with PO contrast be helpful to r/o fistula?   3. Contact Precautions with MDRs = OK for wife to not use gowns barring she stays in her husband's room  Janene Madeira, MSN, NP-C [facility] for Infectious [place] Cell: [phone] Pager: [phone]  09/09/2018  1:38 PM

## 2018-09-09 NOTE — Progress Notes (Addendum)
   Subjective: Patient continues to have left sided pain at the site of his drains. Otherwise feels better. Unable to get much sleep overnight due to being unable to get the TV off. Also concerned with bumps on his abdomen. States that the nurse told him the bumps were his hips and ribs and that he was malnourished. He is embarrassed and feels like a fool. Discussed that he has been sick for almost 12 months and that being malnourished is expected. He is trying to increase his PO intake. Discussed plan for CT abdomen today and further coordination with ID based on his culture data. All questions and concerns addressed.   Objective: Vital signs in last 24 hours: Vitals:   09/08/18 1227 09/08/18 1630 09/08/18 2005 09/09/18 0300  BP: (!) 100/56 105/63 103/62 (!) 93/56  Pulse: 64 (!) 58 66 66  Resp:   18 16  Temp: (!) 97.4 F (36.3 C) 98 F (36.7 C) (!) 97.5 F (36.4 C) (!) 97.4 F (36.3 C)  TempSrc: Oral Oral Oral Oral  SpO2: 96% 95% 95% 93%  Weight:      Height:       General: Malnourished male in no acute distress Pulm: Good air movement with no wheezing or crackles  CV: RRR, no murmurs, no rubs  Abdomen: Active bowel sounds, soft, non-distended, no tenderness to palpation  Skin: Warm and dry   Assessment/Plan:  Principal Problem:   Psoas abscess, left (HCC) Active Problems:   Malnutrition of moderate degree   Retroperitoneal abscess [facility])  Mr. Baumgardner presentedwith worsening left retroperitoneal and left flank subcutaneous abscesses in the setting of an inadequate JP line drainage now s/pupsizing/exchange of drains. Culture grew enterococcus (VRE but amp sensitive) and pseudomonas (susceptibilities returned). He is currently on piperacillin-tazobactam and Linezolid.  Left retroperitoneal and flank subcutaneous abscesses: 1. Appreciate ID recommendations 2. Continue to Piperacillin-tazobactam3.375 g every 8 hours 3. Start Linezolid 600 mg every 12 hours 4.Repeat CTabdomen  with oral contrast to assess for fistula 5. Culture susceptibilities returned will discuss with ID to ensure patient is on proper antibiotics.   Acute kidney injury:Creatinineincreased to 1.30(baseline 0.8-1.0).Likely secondary to decreased p.o. Intake. 1. Continueto monitor  Diarrhea 1.  Continue Imodium 2 mg twice daily 2.  Continue maintenance fluids today  Scattered MacularRash:Likely a contact dermatitis. Improving.  1. Continue to monitor  Anemia of Chronic Disease: Hgb at baseline. 1. Continue to monitor  Dispo: Anticipated discharge in approximately 1-2 day(s) pending the results of the repeat CT abdomen today and coordination of care with ID.   Ina Homes, MD 09/09/2018, 8:56 AM

## 2018-09-10 ENCOUNTER — Ambulatory Visit (HOSPITAL_COMMUNITY): Admission: RE | Admit: 2018-09-10 | Payer: Medicare Other | Source: Ambulatory Visit

## 2018-09-10 ENCOUNTER — Ambulatory Visit (HOSPITAL_COMMUNITY): Payer: Medicare Other

## 2018-09-10 ENCOUNTER — Encounter (HOSPITAL_COMMUNITY): Payer: Self-pay

## 2018-09-10 DIAGNOSIS — B952 Enterococcus as the cause of diseases classified elsewhere: Secondary | ICD-10-CM | POA: Diagnosis not present

## 2018-09-10 DIAGNOSIS — R630 Anorexia: Secondary | ICD-10-CM | POA: Diagnosis not present

## 2018-09-10 DIAGNOSIS — I9589 Other hypotension: Secondary | ICD-10-CM | POA: Diagnosis not present

## 2018-09-10 DIAGNOSIS — D519 Vitamin B12 deficiency anemia, unspecified: Secondary | ICD-10-CM | POA: Diagnosis not present

## 2018-09-10 DIAGNOSIS — L02211 Cutaneous abscess of abdominal wall: Secondary | ICD-10-CM | POA: Diagnosis not present

## 2018-09-10 DIAGNOSIS — Z515 Encounter for palliative care: Secondary | ICD-10-CM | POA: Diagnosis not present

## 2018-09-10 DIAGNOSIS — K6819 Other retroperitoneal abscess: Secondary | ICD-10-CM | POA: Diagnosis not present

## 2018-09-10 DIAGNOSIS — Z7401 Bed confinement status: Secondary | ICD-10-CM | POA: Diagnosis not present

## 2018-09-10 DIAGNOSIS — L0291 Cutaneous abscess, unspecified: Secondary | ICD-10-CM | POA: Diagnosis not present

## 2018-09-10 DIAGNOSIS — N1330 Unspecified hydronephrosis: Secondary | ICD-10-CM | POA: Diagnosis not present

## 2018-09-10 DIAGNOSIS — K5900 Constipation, unspecified: Secondary | ICD-10-CM | POA: Diagnosis not present

## 2018-09-10 DIAGNOSIS — E569 Vitamin deficiency, unspecified: Secondary | ICD-10-CM | POA: Diagnosis not present

## 2018-09-10 DIAGNOSIS — Z436 Encounter for attention to other artificial openings of urinary tract: Secondary | ICD-10-CM | POA: Diagnosis not present

## 2018-09-10 DIAGNOSIS — Z6830 Body mass index (BMI) 30.0-30.9, adult: Secondary | ICD-10-CM | POA: Diagnosis not present

## 2018-09-10 DIAGNOSIS — E440 Moderate protein-calorie malnutrition: Secondary | ICD-10-CM | POA: Diagnosis not present

## 2018-09-10 DIAGNOSIS — D638 Anemia in other chronic diseases classified elsewhere: Secondary | ICD-10-CM | POA: Diagnosis not present

## 2018-09-10 DIAGNOSIS — N178 Other acute kidney failure: Secondary | ICD-10-CM | POA: Diagnosis not present

## 2018-09-10 DIAGNOSIS — F4312 Post-traumatic stress disorder, chronic: Secondary | ICD-10-CM | POA: Diagnosis not present

## 2018-09-10 DIAGNOSIS — Z9119 Patient's noncompliance with other medical treatment and regimen: Secondary | ICD-10-CM | POA: Diagnosis not present

## 2018-09-10 DIAGNOSIS — A419 Sepsis, unspecified organism: Secondary | ICD-10-CM | POA: Diagnosis not present

## 2018-09-10 DIAGNOSIS — F4323 Adjustment disorder with mixed anxiety and depressed mood: Secondary | ICD-10-CM | POA: Diagnosis not present

## 2018-09-10 DIAGNOSIS — Z1621 Resistance to vancomycin: Secondary | ICD-10-CM | POA: Diagnosis not present

## 2018-09-10 DIAGNOSIS — B372 Candidiasis of skin and nail: Secondary | ICD-10-CM | POA: Diagnosis not present

## 2018-09-10 DIAGNOSIS — R197 Diarrhea, unspecified: Secondary | ICD-10-CM | POA: Diagnosis not present

## 2018-09-10 DIAGNOSIS — K6811 Postprocedural retroperitoneal abscess: Secondary | ICD-10-CM | POA: Diagnosis not present

## 2018-09-10 DIAGNOSIS — R0902 Hypoxemia: Secondary | ICD-10-CM | POA: Diagnosis not present

## 2018-09-10 DIAGNOSIS — M2550 Pain in unspecified joint: Secondary | ICD-10-CM | POA: Diagnosis not present

## 2018-09-10 DIAGNOSIS — Z5181 Encounter for therapeutic drug level monitoring: Secondary | ICD-10-CM | POA: Diagnosis not present

## 2018-09-10 DIAGNOSIS — R110 Nausea: Secondary | ICD-10-CM | POA: Diagnosis not present

## 2018-09-10 DIAGNOSIS — R609 Edema, unspecified: Secondary | ICD-10-CM | POA: Diagnosis not present

## 2018-09-10 DIAGNOSIS — E46 Unspecified protein-calorie malnutrition: Secondary | ICD-10-CM | POA: Diagnosis not present

## 2018-09-10 DIAGNOSIS — R509 Fever, unspecified: Secondary | ICD-10-CM | POA: Diagnosis not present

## 2018-09-10 DIAGNOSIS — M6281 Muscle weakness (generalized): Secondary | ICD-10-CM | POA: Diagnosis not present

## 2018-09-10 DIAGNOSIS — I959 Hypotension, unspecified: Secondary | ICD-10-CM | POA: Diagnosis not present

## 2018-09-10 DIAGNOSIS — R21 Rash and other nonspecific skin eruption: Secondary | ICD-10-CM | POA: Diagnosis not present

## 2018-09-10 DIAGNOSIS — R600 Localized edema: Secondary | ICD-10-CM | POA: Diagnosis not present

## 2018-09-10 DIAGNOSIS — B965 Pseudomonas (aeruginosa) (mallei) (pseudomallei) as the cause of diseases classified elsewhere: Secondary | ICD-10-CM | POA: Diagnosis not present

## 2018-09-10 DIAGNOSIS — K0500 Acute gingivitis, plaque induced: Secondary | ICD-10-CM | POA: Diagnosis not present

## 2018-09-10 DIAGNOSIS — R52 Pain, unspecified: Secondary | ICD-10-CM | POA: Diagnosis not present

## 2018-09-10 DIAGNOSIS — K651 Peritoneal abscess: Secondary | ICD-10-CM | POA: Diagnosis not present

## 2018-09-10 DIAGNOSIS — D649 Anemia, unspecified: Secondary | ICD-10-CM | POA: Diagnosis not present

## 2018-09-10 DIAGNOSIS — K2970 Gastritis, unspecified, without bleeding: Secondary | ICD-10-CM | POA: Diagnosis not present

## 2018-09-10 DIAGNOSIS — N179 Acute kidney failure, unspecified: Secondary | ICD-10-CM | POA: Diagnosis not present

## 2018-09-10 LAB — CBC
HCT: 30.1 % — ABNORMAL LOW (ref 39.0–52.0)
Hemoglobin: 8.6 g/dL — ABNORMAL LOW (ref 13.0–17.0)
MCH: 27.4 pg (ref 26.0–34.0)
MCHC: 28.6 g/dL — ABNORMAL LOW (ref 30.0–36.0)
MCV: 95.9 fL (ref 80.0–100.0)
NRBC: 0 % (ref 0.0–0.2)
PLATELETS: 353 10*3/uL (ref 150–400)
RBC: 3.14 MIL/uL — AB (ref 4.22–5.81)
RDW: 18.2 % — AB (ref 11.5–15.5)
WBC: 7.8 10*3/uL (ref 4.0–10.5)

## 2018-09-10 MED ORDER — PIPERACILLIN-TAZOBACTAM IV (FOR PTA / DISCHARGE USE ONLY): 3.3750 g | Freq: Three times a day (TID) | INTRAVENOUS | 0 refills | Status: DC

## 2018-09-10 MED ORDER — ONDANSETRON HCL 4 MG PO TABS: 4.0000 mg | ORAL_TABLET | Freq: Four times a day (QID) | ORAL | 0 refills | Status: AC | PRN

## 2018-09-10 MED ORDER — LOPERAMIDE HCL 2 MG PO CAPS
4.0000 mg | ORAL_CAPSULE | Freq: Two times a day (BID) | ORAL | Status: DC
  Administered 2018-09-10: 4 mg via ORAL
  Filled 2018-09-10: qty 2

## 2018-09-10 MED ORDER — ENSURE ENLIVE PO LIQD: 237.0000 mL | Freq: Two times a day (BID) | ORAL | 12 refills | Status: AC

## 2018-09-10 MED ORDER — PIPERACILLIN-TAZOBACTAM IV (FOR PTA / DISCHARGE USE ONLY): 3.3750 g | Freq: Three times a day (TID) | INTRAVENOUS | 0 refills | Status: AC

## 2018-09-10 MED ORDER — LOPERAMIDE HCL 2 MG PO CAPS: 4.0000 mg | ORAL_CAPSULE | Freq: Two times a day (BID) | ORAL | 0 refills | Status: AC

## 2018-09-10 MED ORDER — ADULT MULTIVITAMIN W/MINERALS CH: 1.0000 | ORAL_TABLET | Freq: Every day | ORAL | Status: AC

## 2018-09-10 MED ORDER — PIPERACILLIN-TAZOBACTAM 3.375 G IVPB: 3.3750 g | Freq: Three times a day (TID) | INTRAVENOUS | 0 refills | Status: DC

## 2018-09-10 NOTE — Progress Notes (Addendum)
I called Mr. CHRISTOHPER DUBE to inform her of him being medically stable for discharge.  I explained to her that he will be discharged with continuation of his current antibiotic Zosyn and will have to follow-up with interventional radiology as an outpatient for potential removal of the drains after output has decreased significantly.  She became very angry stating " I cannot believe you are discharging him without letting me know."  I explained to her that I did in fact speak to Mr. Howk about his discharge and he was amenable to it.  I also stated that he is in good mental capacity and could make these decisions without her consent.  She asked whether I had called the [facility] stating that they would be able to pay for his stay in the hospital.  I explained to her that his ability to pay for his stay did not contribute to our decision to discharge him today.  I then explained to her the increased risk of having him stay in the hospital despite it not being medically necessary--risk of contracting a hospital-acquired infection.  She then asked me if I had seen him today.  I explained to her that I was a physician who admitted him and have been taking care of him during his admission.  She then stated that we had not contacted his doctor at the [place] to let him know of his hospitalization.  She stated that she was on her way to the hospital.  I informed her that if she or Mr. Jeansonne had any further questions that they could have the nurse page me or another physician on the team.  She then hung up on me.

## 2018-09-10 NOTE — Social Work (Signed)
After reviewing MD notes CSW went to speak with pt regarding discharge today. Pt unhappy stating that he was not informed by MD team that he was leaving.   He states he is not leaving "until they get this bacteria out of me, I still have drains and tubes."  CSW explained that it appears pt has follow up appointments scheduled to address these issues- CSW also explained that it is ultimately medical team's decision as to if pt is medically ready for discharge not this writers. Pt again states he is not leaving. Is amenable to CSW paging MD to return to discuss discharge plans.  Paged Dr. Tarri Abernethy regarding pt declining to discharge.  Westley Hummer, MSW, [place] Work [phone]

## 2018-09-10 NOTE — Progress Notes (Signed)
PHARMACY CONSULT NOTE FOR:  OUTPATIENT  PARENTERAL ANTIBIOTIC THERAPY (OPAT)  Indication: Psoas abscess with pseudomonas/VRE Regimen: Zosyn 3.375 gm every 8 hours End date: 09/18/18  IV antibiotic discharge orders are pended. To discharging provider:  please sign these orders via discharge navigator,  Select New Orders & click on the button choice - Manage This Unsigned Work.     Thank you for allowing pharmacy to be a part of this patient's care.  Susa Raring, PharmD, BCPS Infectious Diseases Clinical Pharmacist Phone: [phone] 09/10/2018, 12:43 PM

## 2018-09-10 NOTE — Clinical Social Work Placement (Addendum)
   CLINICAL SOCIAL WORK PLACEMENT  NOTE [place] RN to call report to [phone]  Date:  09/10/2018  Patient Details  Name: Jerry [name] MRN: [id number] Date of Birth: May 30, 1946  Clinical Social Work is seeking post-discharge placement for this patient at the [place] level of care (*CSW will initial, date and re-position this form in  chart as items are completed):  Yes   Patient/family provided with [place] Work Department's list of facilities offering this level of care within the geographic area requested by the patient (or if unable, by the patient's family).  Yes   Patient/family informed of their freedom to choose among providers that offer the needed level of care, that participate in Medicare, Medicaid or managed care program needed by the patient, have an available bed and are willing to accept the patient.  Yes   Patient/family informed of [facility]'s ownership interest in [facility] and [facility], as well as of the fact that they are under no obligation to receive care at these facilities.  PASRR submitted to EDS on       PASRR number received on       Existing PASRR number confirmed on 09/06/18     FL2 transmitted to all facilities in geographic area requested by pt/family on 09/06/18     FL2 transmitted to all facilities within larger geographic area on       Patient informed that his/her managed care company has contracts with or will negotiate with certain facilities, including the following:        Yes   Patient/family informed of bed offers received.  Patient [facility] bed [facility]     Physician recommends and patient chooses bed at      Patient to be transferred to [facility] on 09/10/18.  Patient to be transferred to facility by [facility]     Patient family notified on 09/10/18 of transfer.  Name of family member notified:  pt POA [name] by attending MD; CSW updated pt-he is  oriented.  PHYSICIAN Please prepare priority discharge summary, including medications, Please prepare prescriptions     Additional Comment:    _______________________________________________ Alexander Mt, LCSWA 09/10/2018, 11:33 AM

## 2018-09-10 NOTE — Progress Notes (Signed)
[facility] for Infectious Disease  Date of Admission:  09/04/2018   Total days of antibiotics 6 pip-tazo           Patient ID: Markies Mowatt is a 72 y.o. male with  Principal Problem:   Psoas abscess, left ([place]) Active Problems:   Malnutrition of moderate degree   Retroperitoneal abscess (HCC)   VRE (vancomycin-resistant Enterococci) infection   Pseudomonas infection  Patient with MDR PsA, VRE and bacteroides in psoas muscle abscess. Planning treatment with piperacillin-tazobactam alone for 7 days with close follow up with ID team to determine LOT based on output from drain.   OPAT ORDERS:  Diagnosis: Psoas Muscle Abscess   Culture Result: VRE, PsA (R-cephalosporins, carbapenems, MIC32 pip-tazo)  No Known Allergies  Discharge antibiotics: Piperacillin-tazobactam 3.75 g Q8h IV  Duration: 7d   End Date: 09/18/18  [facility] Care and Maintenance Per Protocol __ Please pull PIC at completion of IV antibiotics _x_ Please leave PIC in place until doctor has seen patient or been notified  Labs weekly while on IV antibiotics: _x_ CBC with differential _x_ BMP  MONITORING: **PLEASE RECORD AND FAX OUTPUT FOR DRAIN Q24H  Fax weekly labs AND ABOVE to [phone]   Clinic Follow Up Appt: September 18, 2018 @ 9:30 am with Dr. Baxter Flattery @ [facility]    Results for orders placed or performed during the hospital encounter of 09/04/18  Blood culture (routine x 2)     Status: None   Collection Time: 09/04/18 12:19 PM  Result Value Ref Range Status   Specimen Description BLOOD LEFT ANTECUBITAL  Final   Special Requests   Final    BOTTLES DRAWN AEROBIC AND ANAEROBIC Blood Culture results may not be optimal due to an excessive volume of blood received in culture bottles   Culture   Final    NO GROWTH 5 DAYS Performed at [facility], [place] [address], [place], [place] [address]    Report Status 09/09/2018 FINAL  Final  Urine culture     Status: None   Collection  Time: 09/04/18 12:59 PM  Result Value Ref Range Status   Specimen Description URINE, RANDOM  Final   Special Requests NONE  Final   Culture   Final    NO GROWTH Performed at [facility], [place] [address], [place], [place] [address]    Report Status 09/05/2018 FINAL  Final  Blood culture (routine x 2)     Status: None   Collection Time: 09/04/18  4:31 PM  Result Value Ref Range Status   Specimen Description BLOOD LEFT FOREARM  Final   Special Requests   Final    BOTTLES DRAWN AEROBIC AND ANAEROBIC Blood Culture adequate volume   Culture   Final    NO GROWTH 5 DAYS Performed at [facility], [place] [address] [place], [place] [address]    Report Status 09/09/2018 FINAL  Final  Aerobic/Anaerobic Culture (surgical/deep wound)     Status: None   Collection Time: 09/05/18  2:11 PM  Result Value Ref Range Status   Specimen Description ABSCESS LEFT FLANK  Final   Special Requests NONE  Final   Gram Stain   Final    FEW WBC PRESENT, PREDOMINANTLY PMN FEW GRAM POSITIVE COCCI IN PAIRS IN CLUSTERS FEW GRAM POSITIVE RODS FEW GRAM NEGATIVE RODS    Culture   Final    MODERATE ENTEROCOCCUS FAECALIS RARE PSEUDOMONAS AERUGINOSA MODERATE BACTEROIDES VULGATUS BETA  LACTAMASE POSITIVE Performed at [facility], [place] [address] [place], [place] [address]    Report Status 09/09/2018 FINAL  Final   Organism ID, Bacteria ENTEROCOCCUS FAECALIS  Final   Organism ID, Bacteria PSEUDOMONAS AERUGINOSA  Final      Susceptibility   Enterococcus faecalis - MIC*    AMPICILLIN <=2 SENSITIVE Sensitive     VANCOMYCIN >=32 RESISTANT Resistant     GENTAMICIN SYNERGY SENSITIVE Sensitive     LINEZOLID 2 SENSITIVE Sensitive     * MODERATE ENTEROCOCCUS FAECALIS   Pseudomonas aeruginosa - MIC*    CEFTAZIDIME 32 RESISTANT Resistant     CIPROFLOXACIN 1 SENSITIVE Sensitive     GENTAMICIN <=1 SENSITIVE Sensitive     IMIPENEM >=16 RESISTANT Resistant     PIP/TAZO 32 SENSITIVE Sensitive     CEFEPIME  INTERMEDIATE Intermediate     * RARE PSEUDOMONAS AERUGINOSA    Janene Madeira, MSN, NP-C [facility] for Infectious Disease [place] Cell: [phone] Pager: [phone]  09/10/2018  12:42 PM

## 2018-09-10 NOTE — Social Work (Signed)
CSW acknowledging consult, confirmed with Claiborne Billings, admissions liaison from [place] that pt is able to discharge back to SNF today.  Westley Hummer, MSW, [place] Work [phone]

## 2018-09-10 NOTE — Care Management Important Message (Signed)
Important Message  Patient Details  Name: Jerry [name] MRN: [id number] Date of Birth: 09/24/1946   Medicare Important Message Given:  Yes    Ella Bodo, RN 09/10/2018, 2:09 PM

## 2018-09-10 NOTE — Discharge Summary (Signed)
Name: Jerry [name] MRN: [id number] DOB: 31-Mar-1946 72 y.o. PCP: Aura Dials, MD  Date of Admission: 09/04/2018 10:55 AM Date of Discharge: 09/10/2018 Attending Physician: Aldine Contes, MD  Discharge Diagnosis: Intraabdominal Abscess 2/2 Unknown Source   Discharge Medications: Allergies as of 09/10/2018   No Known Allergies     Medication List    STOP taking these medications   amoxicillin-clavulanate 875-125 MG tablet Commonly known as:  AUGMENTIN     TAKE these medications   acetaminophen 325 MG tablet Commonly known as:  TYLENOL Take 650 mg by mouth 3 (three) times daily.   antiseptic oral rinse Liqd 15 mLs by Mouth Rinse route 3 (three) times daily.   feeding supplement (ENSURE ENLIVE) Liqd Take 237 mLs by mouth 2 (two) times daily between meals.   loperamide 2 MG capsule Commonly known as:  IMODIUM Take 2 capsules (4 mg total) by mouth 2 (two) times daily.   multivitamin with minerals Tabs tablet Take 1 tablet by mouth daily. Start taking on:  09/11/2018   ondansetron 4 MG tablet Commonly known as:  ZOFRAN Take 1 tablet (4 mg total) by mouth every 6 (six) hours as needed for nausea.   piperacillin-tazobactam 3.375 GM/50ML IVPB Commonly known as:  ZOSYN Inject 50 mLs (3.375 g total) into the vein every 8 (eight) hours. Please continue to follow output of drains. Please continue antibiotic for 7 more days once his drainage is <20 cc for 24 hour period. What changed:  additional instructions   polyethylene glycol packet Commonly known as:  MIRALAX / GLYCOLAX Take 17 g by mouth 2 (two) times daily.   senna 8.6 MG Tabs tablet Commonly known as:  SENOKOT Take 2 tablets (17.2 mg total) by mouth at bedtime as needed for mild constipation or moderate constipation.   vitamin B-12 1000 MCG tablet Commonly known as:  CYANOCOBALAMIN Take 2 tablets (2,000 mcg total) by mouth daily.       Disposition and follow-up:   [name] was discharged  from [facility] in Stable condition.  At the hospital follow up visit please address:  1.  Intraabdominal Abscess. Continue IV Zosyn. Monitor drain output, once less than 20cc/day the patient will need repeat CT abdomen to ensure the abscess have resolved. At this point (output <20 cc/day and drains removed per IR) continue IV Zosyn for 7 more days and then stop. The patient will need to follow-up with ID and IR.   2.  Labs / imaging needed at time of follow-up: None  3.  Pending labs/ test needing follow-up: None  Follow-up Appointments: Follow-up Information    Markus Daft, MD Follow up.   Specialties:  Interventional Radiology, Radiology Why:  follow up CT and drain injections ~10 days; pt will hear from scheduler for time and date-- call [phone] if questions Contact information: [place] [address] [phone]         Hospital Course by problem list:  Intraabdominal Abscess 2/2 Unknown Source. Jerry [name] is a 72 year old male with previous prolonged hospitalization secondary to the left retroperitoneal and left flank abscess. He was hospitalized from 08/02/2018 to 08/21/2018 for management of these abscesses. At that point interventional radiology had placed two drains and he was discharged to skilled nursing facility for continued IV antibiotics, Zosyn. On 09/04/2018 the patient presented back to the emergency department with worsening abdominal distention and pain. Repeat CT abdomen illustrated enlargement of the left retroperitoneal and flank abscess. IR evaluated  the drains and felt that they were coming out. He subsequently went for repeat placement of the drains and upsizing. Repeat CT abdomen three days after placement illustrated significant improvement in the left retroperitoneal abscess and almost complete resolution of the left flank abscess. Based on culture data infectious disease kept his antibiotics to just zosyn. The  patient will need to follow up with interventional radiology and the drains will need to be removed once they are draining less than 20 mL per day. At which point his IV antibiotics will be continued for an additional seven days and then discontinued. The patient was discharged in stable condition and no medication changes were made aside from those described above. If these abscesses persist or begin to enlarge even with appropriate drain placement the patient should be evaluated for an enteric fistula with a CT abdomen with rectal contrast.  Discharge Vitals:   BP 96/60 (BP Location: Left Arm)   Pulse 74   Temp 97.9 F (36.6 C) (Oral)   Resp 11   Ht 5\' 11"  (1.803 m)   Wt 100.6 kg   SpO2 95%   BMI 30.93 kg/m   Pertinent Labs, Studies, and Procedures:   CT Abdomen and Pelvis 09/04/2018 IMPRESSION: 1. Left retroperitoneum and left flank subcutaneous fluid collections have increased in size from 08/15/2018, despite well-positioned drainage catheters. The subcutaneous collection has layering fat density, is the drainage chylous? 2. Stable right hydronephrosis with indwelling ureteral stent. The lower retention loop is still formed at the level of the lower Ureter.  CT Abdomen and Pelvis 09/09/2018 IMPRESSION: 1. Interval decrease in size of left retroperitoneal fluid collection. Currently approximately 120 cc. This is compared with 750 cc previously. 2. Near complete resolution of left posterior abdominal wall fluid collection. 3. Mild increase caliber of the proximal small bowel loops with air-fluid levels which may reflect ileus. Similar to previous exam. 4. Persistent right hydronephrosis with indwelling nephroureteral stent. 5. Gallstone and right kidney stones. 6.  Aortic Atherosclerosis (ICD10-I70.0).  Signed: Carroll Sage, MD 09/10/2018, 11:14 AM

## 2018-09-10 NOTE — Evaluation (Signed)
Physical Therapy Evaluation Patient Details Name: Jerry [name] MRN: [id number] DOB: 03-11-1946 Today's Date: 09/10/2018   History of Present Illness   Jerry [name] is a 72 year old male with OSA, chronic diastolic congestive heart failure, and hypertension who presented with flank pain. Pt recently admitted for sepsis due to L retroperitoneal/psoas abscess. Pt d/c'd to [facility] with drains.   Clinical Impression  Pt at functional baseline. Pt hasn't ambulated in a year or has been transferred OOB to w/c in 3 months. Pt refused to try to sit EOB with PT today or get to chair. Complete bilat LE there ex and ROM. Acute PT to cont to follow.    Follow Up Recommendations SNF;Supervision/Assistance - 24 hour;    Equipment Recommendations  None recommended by PT    Recommendations for Other Services       Precautions / Restrictions Precautions Precautions: Fall Precaution Comments: multiple drains Restrictions Weight Bearing Restrictions: No      Mobility  Bed Mobility Overal bed mobility: Needs Assistance Bed Mobility: Rolling Rolling: Total assist;+2 for physical assistance         General bed mobility comments: pt with minimal assist  Transfers                 General transfer comment: declined  Ambulation/Gait             General Gait Details: unable  Stairs            Wheelchair Mobility    Modified Rankin (Stroke Patients Only)       Balance Overall balance assessment: Needs assistance     Sitting balance - Comments: dependent with hoyer lift to chair                                     Pertinent Vitals/Pain Pain Assessment: Faces Faces Pain Scale: Hurts even more Pain Location: hip with ROM Pain Descriptors / Indicators: Grimacing Pain Intervention(s): Monitored during session    Home Living Family/patient expects to be discharged to:: Skilled nursing facility                 Additional Comments: was at  [facility]    Prior Function Level of Independence: Needs assistance   Gait / Transfers Assistance Needed: pt reports use of hoyer lift into w/c but hasn't done that in 3 months, hasn't walked since 09/19/2017  ADL's / Homemaking Assistance Needed: dependent        Hand Dominance        Extremity/Trunk Assessment   Upper Extremity Assessment Upper Extremity Assessment: Generalized weakness    Lower Extremity Assessment Lower Extremity Assessment: Generalized weakness;RLE deficits/detail;LLE deficits/detail RLE Deficits / Details: able to active quad set and ankle DF, otherwise AAROM t/o LE LLE Deficits / Details: able to active quad set and ankle DF, otherwise AAROM t/o LE    Cervical / Trunk Assessment Cervical / Trunk Assessment: Other exceptions Cervical / Trunk Exceptions: multiple drains  Communication   Communication: No difficulties(soft spoken)  Cognition Arousal/Alertness: Awake/alert Behavior During Therapy: WFL for tasks assessed/performed Overall Cognitive Status: Within Functional Limits for tasks assessed                                        General Comments General comments (skin integrity, edema, etc.): pt with multiple drains including  flexiseal    Exercises Other Exercises Other Exercises: complete passive and AA ROM to bilat LEs at ankle, knee and hip x 10 reps   Assessment/Plan    PT Assessment Patient needs continued PT services  PT Problem List Decreased strength;Decreased activity tolerance;Decreased mobility       PT Treatment Interventions Therapeutic exercise;Stair training;Functional mobility training;Therapeutic activities;Patient/family education    PT Goals (Current goals can be found in the Care Plan section)  Acute Rehab PT Goals Patient Stated Goal: none stated PT Goal Formulation: With patient Time For Goal Achievement: 09/23/18 Potential to Achieve Goals: Fair    Frequency Min 2X/week   Barriers to  discharge        Co-evaluation               AM-PAC PT "6 Clicks" Mobility  Outcome Measure Help needed turning from your back to your side while in a flat bed without using bedrails?: Total Help needed moving from lying on your back to sitting on the side of a flat bed without using bedrails?: Total Help needed moving to and from a bed to a chair (including a wheelchair)?: Total Help needed standing up from a chair using your arms (e.g., wheelchair or bedside chair)?: Total Help needed to walk in hospital room?: Total Help needed climbing 3-5 steps with a railing? : Total 6 Click Score: 6    End of Session Equipment Utilized During Treatment: Gait belt Activity Tolerance: Patient limited by pain Patient left: in bed;with call bell/phone within reach(MDs came in room to discuss d/c) Nurse Communication: Mobility status PT Visit Diagnosis: Muscle weakness (generalized) (M62.81);Difficulty in walking, not elsewhere classified (R26.2)    Time: 5364-6803 PT Time Calculation (min) (ACUTE ONLY): 17 min   Charges:   PT Evaluation $PT Eval Low Complexity: 1 Low          Kittie Plater, PT, DPT Acute Rehabilitation Services Pager #: [phone] Office #: [phone]   Berline Lopes 09/10/2018, 1:22 PM

## 2018-09-10 NOTE — Progress Notes (Signed)
Internal Medicine Attending:   I saw and examined the patient. I reviewed the resident's note and I agree with the resident's findings and plan as documented in the resident's note.  Patient states that his appetite is slowly improving and that he feels a little better today.  Patient was initially admitted to the hospital with worsening left-sided flank pain and decreased output from his intra-abdominal JP drains.  He was found to have increasing size of his left retroperitoneal as well as left flank abscess and had upsizing and exchange of his drains.  Fluid culture growing VRE as well as Pseudomonas.  ID follow-up and recommendations appreciated.  We will continue with IV antibiotics for an additional 7 days after the drains have been removed.  Repeat CT abdomen done yesterday showed great improvement in both abscesses.  The source of his abscesses remain uncertain at this time.  Previous imaging has been negative for a fistula.  However, if his abscesses continue to reaccumulate or if the drain output continues to be significant would consider repeat CT with contrast to better evaluate for fistula.  IR follow-up and recommendations appreciated.  Patient follow-up with IR as an outpatient 1 week for drain injection as well as repeat imaging.  Patient is stable for DC back to SNF once bed is available.

## 2018-09-10 NOTE — Progress Notes (Signed)
Patient medically stable for discharge. Notified by social work that the patient did not want to leave. Presented to the room to discuss the patient. Patient is very frustrated that he is being discharged today. He thought that he was going to be staying here for the course of his treatment including monitoring drain output, removal, and finishing antibiotics. Discussed that this will all be accomplished on an outpatient basis. He is stable to return to his skilled nursing facility. Then expresses concerns with the hygiene of the skilled nursing facility and their ability to care for his drains. Discussed looking for another skilled nursing facility; however, the patient becomes very upset states that he doesn't want to look for another facility and would like to be discharged. Social work has asked [place] to contact the patient's [place]. We are searching for her contact information. Patient is stable for discharge and will continue with the discharge process.

## 2018-09-10 NOTE — Progress Notes (Signed)
Referring Physician(s): Dr Orie Fisherman  Supervising Physician: Markus Daft  Patient Status:  [facility] - In-pt  Chief Complaint:  11/21 procedure- IMPRESSION: Successful decompression of the left flank subcutaneous fluid collection and the left retroperitoneal fluid collection by placing a new subcutaneous drain and up sizing / repositioning the left retroperitoneal drain. The old subcutaneous drain was completely removed. Greater than 900 mL of thick yellow fluid was removed from the collections. Fluid was sent for culture and triglyceride to exclude a chylous leak.   Subjective:  Feels better OP less and less  CT yesterday: IMPRESSION: 1. Interval decrease in size of left retroperitoneal fluid collection. Currently approximately 120 cc. This is compared with 750 cc previously. 2. Near complete resolution of left posterior abdominal wall fluid collection. 3. Mild increase caliber of the proximal small bowel loops with air-fluid levels which may reflect ileus. Similar to previous exam. 4. Persistent right hydronephrosis with indwelling nephroureteral stent. 5. Gallstone and right kidney stones. 6.  Aortic Atherosclerosis (ICD10-I70.0    Allergies: Patient has no known allergies.  Medications: Prior to Admission medications   Medication Sig Start Date End Date Taking? Authorizing Provider  acetaminophen (TYLENOL) 325 MG tablet Take 650 mg by mouth 3 (three) times daily.   Yes [provider]  antiseptic oral rinse (BIOTENE) LIQD 15 mLs by Mouth Rinse route 3 (three) times daily.   Yes [provider]  piperacillin-tazobactam (ZOSYN) 3.375 GM/50ML IVPB Inject 50 mLs (3.375 g total) into the vein every 8 (eight) hours for 26 days. 08/20/18 09/15/18 Yes Rehman, Areeg N, DO  vitamin B-12 (CYANOCOBALAMIN) 1000 MCG tablet Take 2 tablets (2,000 mcg total) by mouth daily. 09/26/17  Yes Hongalgi, Lenis Dickinson, MD  amoxicillin-clavulanate (AUGMENTIN) 875-125 MG tablet  Take 1 tablet by mouth 2 (two) times daily. 09/16/18   Rehman, Areeg N, DO  polyethylene glycol (MIRALAX) packet Take 17 g by mouth 2 (two) times daily. Patient not taking: Reported on 08/02/2018 09/26/17   Modena Jansky, MD  senna (SENOKOT) 8.6 MG TABS tablet Take 2 tablets (17.2 mg total) by mouth at bedtime as needed for mild constipation or moderate constipation. Patient not taking: Reported on 08/02/2018 09/26/17   Modena Jansky, MD     Vital Signs: BP 96/60 (BP Location: Left Arm)   Pulse 74   Temp 97.9 F (36.6 C) (Oral)   Resp 11   Ht 5\' 11"  (1.803 m)   Wt 221 lb 12.5 oz (100.6 kg)   SpO2 95%   BMI 30.93 kg/m   Physical Exam  Abdominal: Soft. Bowel sounds are normal.  Musculoskeletal: Normal range of motion.  Neurological: He is alert.  Skin: Skin is warm and dry.  Sites of drain are clean and dry NT no bleeding No sign of infection OP serous from upper JP OP thin brown from lower abscess to bag  total OP 85 cc yesterday   Vitals reviewed.   Imaging: Ct Abdomen Pelvis Wo Contrast  Result Date: 09/09/2018 CLINICAL DATA:  Followup abscess. EXAM: CT ABDOMEN AND PELVIS WITHOUT CONTRAST TECHNIQUE: Multidetector CT imaging of the abdomen and pelvis was performed following the standard protocol without IV contrast. COMPARISON:  09/04/2018. FINDINGS: Lower chest: Small left pleural effusion identified. Increased from previous exam. Subsegmental atelectasis within the right lower lobe is again noted. Hepatobiliary: No focal liver abnormality. Large calcified stone within the gallbladder measures 5.6 cm, image 50/6. No pericholecystic inflammation. No biliary ductal dilatation. Pancreas: Unremarkable. No pancreatic ductal dilatation or  surrounding inflammatory changes. Spleen: Normal in size without focal abnormality. Adrenals/Urinary Tract: The adrenal glands appear normal. Status post left nephrectomy. Multiple right renal calculi are again identified. The largest  stones are in the right renal pelvis measuring up to 1.5 cm. There is persistent right-sided hydronephrosis with double-J nephroureteral stent in place. Suprapubic catheter is again identified. Gas noted within the lumen of the bladder. Stomach/Bowel: Stomach is normal. The small bowel loops are mildly increased in caliber with multiple air-fluid levels identified. The small bowel loops measure up to 3.1 cm, image 42/3. No abnormal dilatation of the colon. Vascular/Lymphatic: Aortic atherosclerosis. No aneurysm. No abdominopelvic adenopathy identified. No pelvic or inguinal adenopathy. Reproductive: Prostate is unremarkable. Other: Left lower quadrant fluid collection with percutaneous pigtail drainage catheter is identified and appears decreased in volume from previous exam. On today's study this measures 3.3 x 9.9 by 7.0 cm (volume = 120 cm^3), image 60/6. On the previous exam this measured 6.9 x 11.8 by 17.7 cm (volume = 750 cm^3). The second fluid collection within the left posterior abdominal wall appears nearly completely resolved, image 36/3. Musculoskeletal: No acute or significant osseous findings. IMPRESSION: 1. Interval decrease in size of left retroperitoneal fluid collection. Currently approximately 120 cc. This is compared with 750 cc previously. 2. Near complete resolution of left posterior abdominal wall fluid collection. 3. Mild increase caliber of the proximal small bowel loops with air-fluid levels which may reflect ileus. Similar to previous exam. 4. Persistent right hydronephrosis with indwelling nephroureteral stent. 5. Gallstone and right kidney stones. 6.  Aortic Atherosclerosis (ICD10-I70.0). Electronically Signed   By: Kerby Moors M.D.   On: 09/09/2018 10:49    Labs:  CBC: Recent Labs    09/06/18 0520 09/08/18 0644 09/09/18 0500 09/10/18 0550  WBC 9.3 7.7 7.9 7.8  HGB 8.2* 8.2* 8.7* 8.6*  HCT 28.1* 27.5* 29.2* 30.1*  PLT 274 285 309 353    COAGS: Recent Labs     08/03/18 0603 09/05/18 0525  INR 1.36 1.36  APTT 43*  --     BMP: Recent Labs    09/05/18 0525 09/06/18 0520 09/08/18 0644 09/09/18 0500  NA 139 139 135 138  K 2.9* 3.9 3.5 3.6  CL 111 110 101 103  CO2 24 25 29  32  GLUCOSE 63* 74 85 86  BUN 8 8 10 11   CALCIUM 8.5* 9.3 9.8 10.3  CREATININE 1.24 1.21 1.13 1.30*  GFRNONAA 56* 57* >60 52*  GFRAA >60 >60 >60 >60    LIVER FUNCTION TESTS: Recent Labs    09/21/17 0743 08/02/18 1921 08/11/18 1246 09/04/18 1219  BILITOT 1.1 0.5 0.3 0.3  AST 39 20 13* 17  ALT 19 15 8 8   ALKPHOS 70 71 56 84  PROT 7.4 7.1 5.9* 5.8*  ALBUMIN 2.7* 1.9* 1.7* 1.3*    Assessment and Plan:  Resting-- feeling better daily OP orders in place for CT and drain injections 1 week Will need flushed daily as OP (5-10 cc daily) Record OP daily He will hear from OP scheduler for time and date of appt  Electronically Signed: Lavonia Drafts, PA-C 09/10/2018, 8:34 AM   I spent a total of 15 Minutes at the the patient's bedside AND on the patient's hospital floor or unit, greater than 50% of which was counseling/coordinating care for left flank abscess drains

## 2018-09-10 NOTE — Social Work (Addendum)
CSW spoke with attending MD, pt cleared for discharge back to [facility]; he is aware and in agreement. Pt A&Ox4. Spoke with bedside RN will call [facility] for 2:30pm.  Clinical Social Worker facilitated patient discharge including contacting patient family and facility to confirm patient discharge plans.  Clinical information faxed to facility and family agreeable with plan.  CSW arranged ambulance transport via [place] to [facility] RN to call [phone] with report prior to discharge.  Clinical Social Worker will sign off for now as social work intervention is no longer needed. Please consult [place] again if new need arises.  Westley Hummer, MSW, [place] Social Worker [phone]

## 2018-09-10 NOTE — Progress Notes (Signed)
   Subjective: Patient states that he is feeling better today. His appetite is a little better. Discussed the results of his repeat CT and coordination of care with ID. Discussed, briefly, goals of care and he would like to continue with the drains and IV antibiotics. Ultimately he would like to get home since he has not been home in almost 1 year. We will discuss the antibiotic regimen with ID and work with social work to get the patient back to a facility. He will need outpatient follow-up with ID and IR.   Objective: Vital signs in last 24 hours: Vitals:   09/09/18 1200 09/09/18 2043 09/10/18 0028 09/10/18 0500  BP: 112/63 (!) 97/53 (!) 92/50 (!) 94/52  Pulse: 67 64 71 75  Resp: 20     Temp: 98.4 F (36.9 C) 98.4 F (36.9 C) 98.3 F (36.8 C) 98.1 F (36.7 C)  TempSrc:  Oral Oral Oral  SpO2: 95% 94% 93% 93%  Weight:      Height:       General: Malnourished male in no acute distress Pulm: Good air movement with no wheezing or crackles  CV: RRR, no murmurs, no rubs  Abdomen: Active bowel sounds, soft, non-distended, no tenderness to palpation  Skin: Warm and dry, rash is improved   Assessment/Plan:  Principal Problem:   Psoas abscess, left (HCC) Active Problems:   Malnutrition of moderate degree   Retroperitoneal abscess (HCC)   VRE (vancomycin-resistant Enterococci) infection   Pseudomonas infection  Mr. Cutrone presentedwith worsening left retroperitoneal and left flank subcutaneous abscesses in the setting of an inadequate JP line drainage now s/pupsizing/exchange of drains.Culture grew enterococcus(VRE but amp sensitive) andpseudomonas(susceptibilitiesreturned). Repeat CT showed decrease in size of left retroperitoneal fluid collection currently 120 cc compared to 750 cc previously seen.  Also shows near complete resolution of left posterior abdominal wall fluid collection.  He was on piperacillin-tazobactam and Linezolid.  ID recommended switching linezolid to  ampicillin 2 g every 6 hours.  Left retroperitoneal and flank subcutaneous abscesses: 1. Appreciate ID recommendations 2. Continue to Piperacillin-tazobactam3.375 g every 8 hours 3. Discontinue linezolid600 mg every 12 hours 4.  Start ampicillin 2 g every 6 hours 5.  Antibiotic course will include an additional 7 days after drain output has dwindled <20 mL per day.  Diarrhea 1.  Increase Imodium to 4 mg twice daily 2. Continue maintenance fluids today  Scattered MacularRash:Likely a contact dermatitis. Improving.  1. Continue to monitor  Anemia of Chronic Disease: Hgb at baseline. 1. Continue to monitor  Dispo: Anticipated discharge in approximately 1-2 day(s) pending placement at a facility. Patient is medically stable for discharge once a bed is available.   Ina Homes, MD 09/10/2018, 7:52 AM Pager: [phone]

## 2018-09-10 NOTE — Care Management Note (Signed)
Case Management Note  Patient Details  Name: Jahseh Lucchese MRN: [id number] Date of Birth: 1946/05/12  Subjective/Objective:  Mr. Joss is a 72 year old male with OSA, chronic diastolic congestive heart failure, and hypertension who presented with flank pain. Pt recently admitted for sepsis due to L retroperitoneal/psoas abscess.  PTA, pt resided at [facility].                  Action/Plan: Pt medically stable for discharge back to SNF today.  Plan dc to [facility], per CSW arrangements.  Expected Discharge Date:  09/10/18               Expected Discharge Plan:  Skilled Nursing Facility  In-House Referral:  Clinical Social Work  Discharge planning Services  CM Consult  Post Acute Care Choice:    Choice offered to:     DME Arranged:    DME Agency:     HH Arranged:    [place] Agency:     Status of Service:  Completed, signed off  If discussed at [employer], dates discussed:    Additional Comments:  Reinaldo Raddle, RN, BSN  Trauma/Neuro ICU Case Manager [phone]

## 2018-09-11 ENCOUNTER — Other Ambulatory Visit (HOSPITAL_COMMUNITY): Payer: Self-pay | Admitting: Interventional Radiology

## 2018-09-11 ENCOUNTER — Telehealth (HOSPITAL_COMMUNITY): Payer: Self-pay | Admitting: Radiology

## 2018-09-11 DIAGNOSIS — K5900 Constipation, unspecified: Secondary | ICD-10-CM | POA: Diagnosis not present

## 2018-09-11 DIAGNOSIS — R630 Anorexia: Secondary | ICD-10-CM | POA: Diagnosis not present

## 2018-09-11 DIAGNOSIS — D519 Vitamin B12 deficiency anemia, unspecified: Secondary | ICD-10-CM | POA: Diagnosis not present

## 2018-09-11 DIAGNOSIS — R52 Pain, unspecified: Secondary | ICD-10-CM | POA: Diagnosis not present

## 2018-09-11 DIAGNOSIS — M6281 Muscle weakness (generalized): Secondary | ICD-10-CM | POA: Diagnosis not present

## 2018-09-11 DIAGNOSIS — Z9889 Other specified postprocedural states: Secondary | ICD-10-CM

## 2018-09-11 DIAGNOSIS — R197 Diarrhea, unspecified: Secondary | ICD-10-CM | POA: Diagnosis not present

## 2018-09-11 DIAGNOSIS — E440 Moderate protein-calorie malnutrition: Secondary | ICD-10-CM | POA: Diagnosis not present

## 2018-09-11 DIAGNOSIS — B372 Candidiasis of skin and nail: Secondary | ICD-10-CM | POA: Diagnosis not present

## 2018-09-11 DIAGNOSIS — K651 Peritoneal abscess: Secondary | ICD-10-CM

## 2018-09-11 NOTE — Telephone Encounter (Signed)
Received call from [place] with [facility] where pt resides. She is requesting drain care instructions. Gave info to KA/PA JM

## 2018-09-13 DIAGNOSIS — M6281 Muscle weakness (generalized): Secondary | ICD-10-CM | POA: Diagnosis not present

## 2018-09-13 DIAGNOSIS — R197 Diarrhea, unspecified: Secondary | ICD-10-CM | POA: Diagnosis not present

## 2018-09-13 DIAGNOSIS — K651 Peritoneal abscess: Secondary | ICD-10-CM | POA: Diagnosis not present

## 2018-09-13 DIAGNOSIS — E440 Moderate protein-calorie malnutrition: Secondary | ICD-10-CM | POA: Diagnosis not present

## 2018-09-13 DIAGNOSIS — R52 Pain, unspecified: Secondary | ICD-10-CM | POA: Diagnosis not present

## 2018-09-13 DIAGNOSIS — D519 Vitamin B12 deficiency anemia, unspecified: Secondary | ICD-10-CM | POA: Diagnosis not present

## 2018-09-13 DIAGNOSIS — R630 Anorexia: Secondary | ICD-10-CM | POA: Diagnosis not present

## 2018-09-13 DIAGNOSIS — B372 Candidiasis of skin and nail: Secondary | ICD-10-CM | POA: Diagnosis not present

## 2018-09-13 DIAGNOSIS — K5900 Constipation, unspecified: Secondary | ICD-10-CM | POA: Diagnosis not present

## 2018-09-16 DIAGNOSIS — R197 Diarrhea, unspecified: Secondary | ICD-10-CM | POA: Diagnosis not present

## 2018-09-16 DIAGNOSIS — R630 Anorexia: Secondary | ICD-10-CM | POA: Diagnosis not present

## 2018-09-16 DIAGNOSIS — K651 Peritoneal abscess: Secondary | ICD-10-CM | POA: Diagnosis not present

## 2018-09-16 DIAGNOSIS — D519 Vitamin B12 deficiency anemia, unspecified: Secondary | ICD-10-CM | POA: Diagnosis not present

## 2018-09-16 DIAGNOSIS — K0500 Acute gingivitis, plaque induced: Secondary | ICD-10-CM | POA: Diagnosis not present

## 2018-09-16 DIAGNOSIS — K5900 Constipation, unspecified: Secondary | ICD-10-CM | POA: Diagnosis not present

## 2018-09-16 DIAGNOSIS — B372 Candidiasis of skin and nail: Secondary | ICD-10-CM | POA: Diagnosis not present

## 2018-09-16 DIAGNOSIS — E440 Moderate protein-calorie malnutrition: Secondary | ICD-10-CM | POA: Diagnosis not present

## 2018-09-16 DIAGNOSIS — M6281 Muscle weakness (generalized): Secondary | ICD-10-CM | POA: Diagnosis not present

## 2018-09-16 DIAGNOSIS — R52 Pain, unspecified: Secondary | ICD-10-CM | POA: Diagnosis not present

## 2018-09-17 ENCOUNTER — Other Ambulatory Visit (HOSPITAL_COMMUNITY): Payer: Self-pay | Admitting: Interventional Radiology

## 2018-09-17 DIAGNOSIS — K651 Peritoneal abscess: Secondary | ICD-10-CM

## 2018-09-18 ENCOUNTER — Inpatient Hospital Stay: Payer: PRIVATE HEALTH INSURANCE | Admitting: Internal Medicine

## 2018-09-18 DIAGNOSIS — R52 Pain, unspecified: Secondary | ICD-10-CM | POA: Diagnosis not present

## 2018-09-18 DIAGNOSIS — R630 Anorexia: Secondary | ICD-10-CM | POA: Diagnosis not present

## 2018-09-18 DIAGNOSIS — K651 Peritoneal abscess: Secondary | ICD-10-CM | POA: Diagnosis not present

## 2018-09-18 DIAGNOSIS — M6281 Muscle weakness (generalized): Secondary | ICD-10-CM | POA: Diagnosis not present

## 2018-09-18 DIAGNOSIS — E440 Moderate protein-calorie malnutrition: Secondary | ICD-10-CM | POA: Diagnosis not present

## 2018-09-18 DIAGNOSIS — B372 Candidiasis of skin and nail: Secondary | ICD-10-CM | POA: Diagnosis not present

## 2018-09-18 DIAGNOSIS — K5900 Constipation, unspecified: Secondary | ICD-10-CM | POA: Diagnosis not present

## 2018-09-18 DIAGNOSIS — R197 Diarrhea, unspecified: Secondary | ICD-10-CM | POA: Diagnosis not present

## 2018-09-18 DIAGNOSIS — D519 Vitamin B12 deficiency anemia, unspecified: Secondary | ICD-10-CM | POA: Diagnosis not present

## 2018-09-19 DIAGNOSIS — E440 Moderate protein-calorie malnutrition: Secondary | ICD-10-CM | POA: Diagnosis not present

## 2018-09-19 DIAGNOSIS — D519 Vitamin B12 deficiency anemia, unspecified: Secondary | ICD-10-CM | POA: Diagnosis not present

## 2018-09-19 DIAGNOSIS — R52 Pain, unspecified: Secondary | ICD-10-CM | POA: Diagnosis not present

## 2018-09-19 DIAGNOSIS — R110 Nausea: Secondary | ICD-10-CM | POA: Diagnosis not present

## 2018-09-19 DIAGNOSIS — Z9119 Patient's noncompliance with other medical treatment and regimen: Secondary | ICD-10-CM | POA: Diagnosis not present

## 2018-09-19 DIAGNOSIS — R197 Diarrhea, unspecified: Secondary | ICD-10-CM | POA: Diagnosis not present

## 2018-09-19 DIAGNOSIS — K5900 Constipation, unspecified: Secondary | ICD-10-CM | POA: Diagnosis not present

## 2018-09-19 DIAGNOSIS — R630 Anorexia: Secondary | ICD-10-CM | POA: Diagnosis not present

## 2018-09-19 DIAGNOSIS — M6281 Muscle weakness (generalized): Secondary | ICD-10-CM | POA: Diagnosis not present

## 2018-09-19 DIAGNOSIS — K651 Peritoneal abscess: Secondary | ICD-10-CM | POA: Diagnosis not present

## 2018-09-19 DIAGNOSIS — B372 Candidiasis of skin and nail: Secondary | ICD-10-CM | POA: Diagnosis not present

## 2018-09-20 ENCOUNTER — Other Ambulatory Visit: Payer: Medicare Other | Admitting: *Deleted

## 2018-09-20 ENCOUNTER — Non-Acute Institutional Stay: Payer: Medicare Other | Admitting: Licensed Clinical Social Worker

## 2018-09-20 DIAGNOSIS — D519 Vitamin B12 deficiency anemia, unspecified: Secondary | ICD-10-CM | POA: Diagnosis not present

## 2018-09-20 DIAGNOSIS — R52 Pain, unspecified: Secondary | ICD-10-CM | POA: Diagnosis not present

## 2018-09-20 DIAGNOSIS — M6281 Muscle weakness (generalized): Secondary | ICD-10-CM | POA: Diagnosis not present

## 2018-09-20 DIAGNOSIS — Z515 Encounter for palliative care: Secondary | ICD-10-CM

## 2018-09-20 DIAGNOSIS — K651 Peritoneal abscess: Secondary | ICD-10-CM | POA: Diagnosis not present

## 2018-09-20 DIAGNOSIS — R197 Diarrhea, unspecified: Secondary | ICD-10-CM | POA: Diagnosis not present

## 2018-09-20 DIAGNOSIS — K5900 Constipation, unspecified: Secondary | ICD-10-CM | POA: Diagnosis not present

## 2018-09-20 DIAGNOSIS — B372 Candidiasis of skin and nail: Secondary | ICD-10-CM | POA: Diagnosis not present

## 2018-09-20 DIAGNOSIS — R630 Anorexia: Secondary | ICD-10-CM | POA: Diagnosis not present

## 2018-09-20 DIAGNOSIS — E440 Moderate protein-calorie malnutrition: Secondary | ICD-10-CM | POA: Diagnosis not present

## 2018-09-20 NOTE — Progress Notes (Signed)
COMMUNITY PALLIATIVE CARE SW NOTE  PATIENT NAME: Jerry [name] DOB: 07-26-1946 MRN: [id number]  PRIMARY CARE PROVIDER: Aura Dials, MD  RESPONSIBLE PARTY:  Acct ID - Guarantor Home Phone Work Phone Relationship Acct Type  [id number] - [name],[name] Self P/F     [address], [place], [place] [address]     PLAN OF CARE and INTERVENTIONS:             1. GOALS OF CARE/ ADVANCE CARE PLANNING:  Patient wishes to minimize his medications after his IV Antibiotics have concluded.  Patient is a full code. 2. SOCIAL/EMOTIONAL/SPIRITUAL ASSESSMENT/ INTERVENTIONS:  SW and RN, Jerry [name], met with patient initially.  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  He reports having PTSD.  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  He is receiving services from the [place].  He has a son and ex-wife, who is very supportive.  She is also a Chief Executive Officer and his POA.  Patient discussed his faith openly. 3. PATIENT/CAREGIVER EDUCATION/ COPING:  SW and RN provided education to patient and his ex-wife regarding the Palliative Care Program.  Patient copes by expressing himself openly. 4. PERSONAL EMERGENCY PLAN:  Per facility [facility]oc[facility]. 5. COMMUNITY RESOURCES COORDINATION/ HEALTH CARE NAVIGATION:  None. 6. FINANCIAL/LEGAL CONCERNS/INTERVENTIONS:  None.     SOCIAL HX:  Social History   Tobacco Use  . Smoking status: Never Smoker  . Smokeless tobacco: Never Used  . Tobacco comment: "smoked cigarettes  for ~ 1 wk in [place]"  Substance Use Topics  . Alcohol use: No    CODE STATUS:  Full Code  ADVANCED DIRECTIVES:  Y MOST FORM COMPLETE:  N HOSPICE EDUCATION PROVIDED:  Y PPS:  Patient states he can only take fluids because the antibiotics upset his stomach.  He remains in bed, but will participated in physical therapy at times. Duration of visit and documentation:  90 minutes.      Jerry Corn Bergen Magner, LCSW

## 2018-09-20 NOTE — Progress Notes (Signed)
COMMUNITY PALLIATIVE CARE RN NOTE  PATIENT NAME: Jerry [name] DOB: [birth year] MRN: [id number]  PRIMARY CARE PROVIDER: Aura Dials, MD  RESPONSIBLE PARTY:  Acct ID - Guarantor Home Phone Work Phone Relationship Acct Type  [id number] - [place],[name] Self P/F     [address], [place], [place] [address]    PLAN OF CARE and INTERVENTION:  1. ADVANCE CARE PLANNING/GOALS OF CARE: Patient is a full code 2. PATIENT/CAREGIVER EDUCATION: Explained Palliative Care Services 3. DISEASE STATUS: Joint visit made with Palliative Care SW, Lynn Duffy. Met with patient in his room. Initially, patient seemed guarded and suspicious of visit, however quickly became very conversational and engaging as the visit progressed. He provided his health history. He denies pain at this time, however staff reports noticing patient grimacing when turned/repositioned. He is alert and oriented x 3. He openly discussed his issues with PTSD from being in the [employer]. He is currently on IV antibiotics for an intraabdominal abscess. Bilateral JP drains present to drain abscesses. He is also on IV antibiotics. He has a NSL in his R upper arm. Site is WNL. He feels that the antibiotics are causing his inability to eat well, along with the need for additional medications to control symptoms such as Imodium for diarrhea. He wishes to significantly decrease his medication list once the IV antibiotics are complete. He states that he has 1 more week of antibiotics left. He states that he is unable to eat solid foods without nausea/vomiting, but drinks liquids without difficulty. He was able to eat some ruffled potato chips yesterday and was pleased that he kept it down. Met with facility NP after the visit, who states she made the Palliative Referral because initially patient would not eat, was refusing his physical therapy, was very argumentative and continued to progressively decline. NP states that today was the  first interaction where patient was communicative. He is total care with ADLs, except for feeding. Staff states that he lies in the bed all the time and does not want to get up to a recliner. He is incontinent of bowel. He has a suprapubic catheter. Met with patient's ex-wife after visit, who is patient's POA and visits daily.  Explained services and provided Palliative Care contact information. Will continue to monitor.   HISTORY OF PRESENT ILLNESS:  This is a 72 yo male who resides at [facility] facility. Palliative Care Team referral made to assist with goals of care and for assistance with symptom management. Will visit patient 1-2x/month and PRN  CODE STATUS: FULL CODE ADVANCED DIRECTIVES: Y MOST FORM: no PPS: 30%   PHYSICAL EXAM:   LUNGS: clear to auscultation  CARDIAC: Cor RRR EXTREMITIES: No edema SKIN: Exposed skin is dry and intact  NEURO: Alert and oriented x 3, pleasant mood, generalized weakness, bed bound   (Duration of visit and documentation 90 minutes)    Daryl Eastern, RN, BSN

## 2018-09-23 ENCOUNTER — Telehealth: Payer: Self-pay

## 2018-09-23 DIAGNOSIS — R600 Localized edema: Secondary | ICD-10-CM | POA: Diagnosis not present

## 2018-09-23 NOTE — Telephone Encounter (Signed)
Sent referral to scheduling and filed notes 

## 2018-09-25 ENCOUNTER — Other Ambulatory Visit: Payer: PRIVATE HEALTH INSURANCE

## 2018-09-25 DIAGNOSIS — F4323 Adjustment disorder with mixed anxiety and depressed mood: Secondary | ICD-10-CM | POA: Diagnosis not present

## 2018-09-25 DIAGNOSIS — F4312 Post-traumatic stress disorder, chronic: Secondary | ICD-10-CM | POA: Diagnosis not present

## 2018-09-26 ENCOUNTER — Ambulatory Visit (HOSPITAL_COMMUNITY): Payer: Medicare Other

## 2018-09-26 ENCOUNTER — Inpatient Hospital Stay (HOSPITAL_COMMUNITY): Admission: RE | Admit: 2018-09-26 | Payer: PRIVATE HEALTH INSURANCE | Source: Ambulatory Visit

## 2018-09-27 ENCOUNTER — Ambulatory Visit (HOSPITAL_COMMUNITY)
Admission: RE | Admit: 2018-09-27 | Discharge: 2018-09-27 | Disposition: A | Discharge status: Home / Self Care | Payer: Medicare Other | Source: Ambulatory Visit | Attending: Interventional Radiology | Admitting: Interventional Radiology

## 2018-09-27 ENCOUNTER — Other Ambulatory Visit (HOSPITAL_COMMUNITY): Payer: Self-pay | Admitting: Interventional Radiology

## 2018-09-27 ENCOUNTER — Encounter (HOSPITAL_COMMUNITY): Payer: Self-pay | Admitting: Interventional Radiology

## 2018-09-27 DIAGNOSIS — Z905 Acquired absence of kidney: Secondary | ICD-10-CM | POA: Insufficient documentation

## 2018-09-27 DIAGNOSIS — K6819 Other retroperitoneal abscess: Secondary | ICD-10-CM | POA: Insufficient documentation

## 2018-09-27 HISTORY — PX: IR CATHETER TUBE CHANGE: IMG717

## 2018-09-27 HISTORY — PX: IR SINUS/FIST TUBE CHK-NON GI: IMG673

## 2018-09-27 MED ORDER — IOPAMIDOL (ISOVUE-300) INJECTION 61%
INTRAVENOUS | Status: AC
  Administered 2018-09-27: 20 mL
  Filled 2018-09-27: qty 50

## 2018-09-27 MED ORDER — LIDOCAINE HCL 1 % IJ SOLN
INTRAMUSCULAR | Status: AC
  Filled 2018-09-27: qty 20

## 2018-09-27 NOTE — Procedures (Signed)
Pre procedural Dx: Chronic left sided retroperitoneal abscess Post procedural Dx: Same  Successful fluoroscopic guided exchange and up sizing of chronic left retroperitoneal drainage catheter ultimately to a 20 [place] Thal drain (with additional sideholes cut approximately 10 cm peripheral to the coil), yielding a total of approximately 125 cc of feculent material.   Removal of left flank superficial percutaneous drainage catheter.   EBL: Minimal  Complications: None immediate  PLAN:  - Remaining drainage catheter connected to a JP bulb.  - Recommend flushing the percutaneous drainage catheter with 10 cc of saline 3 times per day and maintaining diligent records regarding drainage catheter output.  - Would recommend patient returns for CT scan and drainage catheter evaluation at [facility] the week of December 30th.   Ronny Bacon, MD Pager #: [phone]

## 2018-10-01 ENCOUNTER — Encounter (HOSPITAL_COMMUNITY): Payer: Self-pay | Admitting: Radiology

## 2018-10-01 DIAGNOSIS — Z515 Encounter for palliative care: Secondary | ICD-10-CM | POA: Diagnosis not present

## 2018-10-01 DIAGNOSIS — R509 Fever, unspecified: Secondary | ICD-10-CM | POA: Diagnosis not present

## 2018-10-01 DIAGNOSIS — N179 Acute kidney failure, unspecified: Secondary | ICD-10-CM | POA: Diagnosis not present

## 2018-10-01 DIAGNOSIS — Z436 Encounter for attention to other artificial openings of urinary tract: Secondary | ICD-10-CM | POA: Diagnosis not present

## 2018-10-01 DIAGNOSIS — I9589 Other hypotension: Secondary | ICD-10-CM | POA: Diagnosis not present

## 2018-10-02 ENCOUNTER — Other Ambulatory Visit (HOSPITAL_COMMUNITY): Payer: Self-pay | Admitting: Interventional Radiology

## 2018-10-02 DIAGNOSIS — N178 Other acute kidney failure: Secondary | ICD-10-CM | POA: Diagnosis not present

## 2018-10-02 DIAGNOSIS — K651 Peritoneal abscess: Secondary | ICD-10-CM | POA: Diagnosis not present

## 2018-10-02 DIAGNOSIS — I9589 Other hypotension: Secondary | ICD-10-CM | POA: Diagnosis not present

## 2018-10-02 DIAGNOSIS — K6819 Other retroperitoneal abscess: Secondary | ICD-10-CM

## 2018-10-04 DIAGNOSIS — R600 Localized edema: Secondary | ICD-10-CM | POA: Diagnosis not present

## 2018-10-08 ENCOUNTER — Encounter (HOSPITAL_COMMUNITY): Payer: Self-pay | Admitting: Interventional Radiology

## 2018-10-08 ENCOUNTER — Other Ambulatory Visit (HOSPITAL_COMMUNITY): Payer: Self-pay | Admitting: Interventional Radiology

## 2018-10-08 ENCOUNTER — Ambulatory Visit (HOSPITAL_COMMUNITY)
Admission: RE | Admit: 2018-10-08 | Discharge: 2018-10-08 | Disposition: A | Discharge status: Home / Self Care | Payer: Medicare Other | Source: Ambulatory Visit | Attending: Interventional Radiology | Admitting: Interventional Radiology

## 2018-10-08 DIAGNOSIS — K6819 Other retroperitoneal abscess: Secondary | ICD-10-CM

## 2018-10-08 DIAGNOSIS — Z4803 Encounter for change or removal of drains: Secondary | ICD-10-CM

## 2018-10-08 HISTORY — PX: IR CATHETER TUBE CHANGE: IMG717

## 2018-10-08 MED ORDER — LIDOCAINE HCL 1 % IJ SOLN
INTRAMUSCULAR | Status: AC
  Filled 2018-10-08: qty 20

## 2018-10-08 MED ORDER — LIDOCAINE HCL 1 % IJ SOLN
INTRAMUSCULAR | Status: DC | PRN
  Administered 2018-10-08: 5 mL

## 2018-10-08 MED ORDER — IOPAMIDOL (ISOVUE-300) INJECTION 61%
INTRAVENOUS | Status: AC
  Filled 2018-10-08: qty 50

## 2018-10-08 MED ORDER — IOPAMIDOL (ISOVUE-370) INJECTION 76%
INTRAVENOUS | Status: AC
  Filled 2018-10-08: qty 100

## 2018-10-08 NOTE — Procedures (Signed)
Large left RP and flank abscess  S/p 20 fr drain exchg  No comp Stable Several 100s of purulent fld expressed and aspirated for decompression  Full report in pacs  Needs surgical referral for open drainage  He has failed perc drain management

## 2018-10-10 DIAGNOSIS — Z5181 Encounter for therapeutic drug level monitoring: Secondary | ICD-10-CM | POA: Diagnosis not present

## 2018-10-10 DIAGNOSIS — K6819 Other retroperitoneal abscess: Secondary | ICD-10-CM | POA: Diagnosis not present

## 2018-10-14 DIAGNOSIS — K6819 Other retroperitoneal abscess: Secondary | ICD-10-CM | POA: Diagnosis not present

## 2018-10-22 ENCOUNTER — Non-Acute Institutional Stay: Payer: Medicare Other | Admitting: *Deleted

## 2018-10-22 VITALS — BP 82/51 | HR 71 | Temp 98.9°F | Resp 16

## 2018-10-22 DIAGNOSIS — Z515 Encounter for palliative care: Secondary | ICD-10-CM

## 2018-10-22 NOTE — Progress Notes (Signed)
COMMUNITY PALLIATIVE CARE RN NOTE  PATIENT NAME: Jerry [name] DOB: 07/23/1946 MRN: [id number]  PRIMARY CARE PROVIDER: Aura Dials, MD  RESPONSIBLE PARTY:  Acct ID - Guarantor Home Phone Work Phone Relationship Acct Type  [id number] - [place],[name] Self P/F     [address], [place], [place] [address]    PLAN OF CARE and INTERVENTION:  1. ADVANCE CARE PLANNING/GOALS OF CARE: Patient is a Full Code 2. PATIENT/CAREGIVER EDUCATION: Pain Management 3. DISEASE STATUS: Met with patient in his room at the facility. He is lying in bed asleep, but aroused easily with verbal stimulation. He does not appear to be feeling well. He reports pain. FLACC score 6. He states that he recently received Tylenol. He does not wish to have any stronger pain medications. He states that he had a procedure yesterday where skin was cut from his abdomen. He continues with a drain for his abdominal abscess. He now only has 1 JP drain tvs 2 a month ago. He is currently on IV Zosyn. He is pale and running low BPs. BP today 82/51. He is unable to eat solid foods. He is only drinking thin liquids. He appears thinner overall vs a month ago. He is much less talkative and just wanting to sleep. His voice is very weak and he speaks in a whisper. He is total care for ADLs, but is able to drink independently. He is incontinent of both bowel and bladder. Will continue to monitor.   HISTORY OF PRESENT ILLNESS:  This is a 73 yo male who resides at [facility] on the SNF unit. Palliative Care Team continues to follow patient. Will visit monthly and PRN.   CODE STATUS: FULl CODE  ADVANCED DIRECTIVES: Y  MOST FORM: no PPS: 30%   PHYSICAL EXAM:   VITALS: Today's Vitals   10/22/18 1018  BP: (!) 82/51  Pulse: 71  Resp: 16  Temp: 98.9 F (37.2 C)  TempSrc: Temporal  SpO2: 96%  PainSc: 6   PainLoc: Abdomen    LUNGS: clear to auscultation  CARDIAC: Cor RRR EXTREMITIES: No  edema SKIN: Skin is pale, exposed skin is dry and intact  NEURO: Lethargic, increased generalized weakness, bed bound   (Duration of visit and documentation 45 minutes)    Daryl Eastern, RN, BSN

## 2018-10-30 ENCOUNTER — Inpatient Hospital Stay: Payer: PRIVATE HEALTH INSURANCE | Admitting: Internal Medicine

## 2018-11-11 ENCOUNTER — Non-Acute Institutional Stay: Payer: Medicare Other | Admitting: Internal Medicine

## 2018-11-11 VITALS — BP 80/46 | HR 66

## 2018-11-11 DIAGNOSIS — Z515 Encounter for palliative care: Secondary | ICD-10-CM

## 2018-11-11 NOTE — Progress Notes (Signed)
[facility] Telephone: [phone] Fax: [phone]  PATIENT NAME: Jerry [name] DOB: [birth year] MRN: [id number]  PRIMARY CARE PROVIDER:   Aura Dials, MD  REFERRING PROVIDER: Humberto Leep, NP/ Dr. Daylene [name]  RESPONSIBLE PARTY:   Self and Jerry [name](POA/Ex-wife)  [phone]      RECOMMENDATIONS and PLAN:   Palliative Care Encounter  Z51.5  1.  Advanced Care Planning:  Lengthy discussion with patient and POA/ex-wife Jerry [name] to discuss goals of care and end of life care.  Pt has a current living will which indicates a natural death.  Current code status is for DNAR/DNI.  MOST form explained and pt made the following decisions:  Comfort care without return to the hospital.  He is receptive to antibiotics if indicated, IV fluids for a trial period but no tube feedings or blood transfusion.  Documents were completed and signed by POA per pt request and placed in pt chart along with his Healthcare POA documents. Duplicate copy provided to POA for pt home record.  He voiced that he would still like to attend the pending appointments at the [facility] clinics with infectious disease on 1/28 and urology on 1/29 via [place] ambulance transport.  I informed patient that he may be too ill to be transported for these appointments.  Reviewed Palliative vs Comfort/Hospice care services and informed pt that he is nearing end of life and Comfort/Hospice care transition is recommended now to increase the support that he will need as his health continues to decline.  Discussion of options of remaining at SNF while receiving Hospice or inpt hospice house if symptoms are not managable in current surroundings.  Returning to a home environment is not an option at this time due to lack of caregiver that can provide hands on 24 hr care.   Pt expressed that he would like to stay at current facility to receive comfort care.   Currently in very poor health and I do not forsee that pt is a surgical  candidate due to comorbidities along with current hypotension and wavering levels of hypoxia despite supplemental O2 use now at 12L/min.  I plan to visit with pt again this week after he and POA have had an opportunity to speak with ID and Urologist to readdress goals of care and potential transition to Hospice care.    2. Weakness:  Related to multiple chronic issues(anemia with ongoing hematuria, chronic infection of flank wound, malnutrition, dehydration and end of life)  Supportive and comfort care.  Transition to Hospice care at SNF.  Hospice house option would be available if pt meets criteria.   3. Protein calorie malnutrition:  Related to anorexia and nearing end of life.  T. Protein=5.8  Albumin=1.3(09/04/18)  Weight loss of minimum of 22# in Nov. 2109. Last wt was 220# in hospital.  Refusal to weight now. Offer comfort feedings, protein supplements, Remeron 25 mg q HS.    4. Dehydration:  Third spacing of fluids and poor oral intake.  Attempt to increase oral hydration.  Mouth care frequently.  5. Hypoxia:  Monitor O2 sats every shift and prn.  Wean O2 to sats of 90% or greater. Offer O2 via mask prn.  Consider addition of concentrated Morphine 5mg  PO prn air hunger or severe pain..    6.  Caregiver burden:  Ex wife is POA and is supportive. She lives in [place] state.  One joint son that pt has not been in contact with for numerous years.  Continue chaplain support  at facility and same would be continued upon transition to Hospice.   I spent 165 minutes providing this consultation,  from 2:00 pm to 4:45 pm at pt bedside and nurse's station. More than 50% of the time in this consultation was spent coordinating communication, reviewing records completing DNR and MOST forms with patient and POA Ms. Jerry [name].  Jerry Leep, NP, SW, nursing supervisor, clinical staff and Jerry [name] were updated on today's progress, pt. Goals and plans for further discussion.  HISTORY OF PRESENT ILLNESS:  Jerry [name] is a 73 y.o. year old male with multiple medical problems including left retroperitoneal and left flank abscesses which required hospitalization from 11/20-11/26/2019.  He currently has a drain in the L flank along with a large, open wound of the L flank requiring daily dressing changes.  He also has a hx of CHF(EF of 55-60% in 2015), L nephrectomy, cervical myelopathy and PTSD.  Staff reports that patient became hypoxic over last 2 days with increased weakness and hypotension.  He has not eaten solid foods for at least 2 weeks and sips on beverages.  Reports that he often refuses medications other than Tylenol. He is bedbound and requires total care.  He is alert and oriented.   Palliative Care was asked to help address goals of care.   CODE STATUS: DNAR/DNI  PPS: 20% weak HOSPICE ELIGIBILITY/DIAGNOSIS: YES/ Anorexia with Protein calorie malnutrition, chronic infection related to retroperitoneal abscess  PAST MEDICAL HISTORY:  Past Medical History:  Diagnosis Date  . Acute kidney injury ([place]) 09/21/2017  . Arthritis    "hands, knees, back, legs" (06/10/2014)  . Atonic neurogenic bladder 06/30/2016  . Bilateral leg weakness 10/25/2012  . Cervical myelopathy ([place])   . Chronic diastolic congestive heart failure ([place]) 06/23/2014  . Coma ([place]) 1968   "for 2 wks"  . Fall at home 10/25/2012  . Generalized weakness 06/23/2014  . MHDQQIWL(798.9)    "monthly" (06/10/2014)  . Lower urinary tract infectious disease 10/25/2012  . Malaria   . Obesity 10/25/2012  . Presence of suprapubic catheter ([place]) 06/17/2014   Due to prostate cancer   . Prostate cancer ([place])   . PTSD (post-traumatic stress disorder)    "I'm all over that now"  . Rhabdomyolysis 09/21/2017  . Sleep apnea 10/25/2012  . Staghorn calculus 06/30/2016  . Staphylococcus aureus bacteremia 06/30/2014  . Urinary incontinence, functional 10/25/2012  . UTI (urinary tract infection) 09/2017    ALLERGIES: No Known Allergies     PHYSICAL EXAM:    General: Chronically ill appearing male with signs of acute weight loss.  In NAD.  Sunken temples ENT:  Very dry mucus membranes. Whispered voice Cardiovascular: regular rate and rhythm Pulmonary: unlabored respirations.  O2 via [place] at 12L Abdomen: Concave, soft, nontender,  GU: no suprapubic tenderness. Suprapubic catheter patent of gross bloody urine Extremities: edema of hands with 3+ edema BLE Skin: poor skin turgor. Reports of L flank wound but not seen Neurological: Alert and oriented x 3.  Gross weakness  Drowsy at end of visit. Psych:  Initially agitated with conversation but mood calmed and was very cooperative.  Appreciative of visit  Gonzella Lex, NP-C

## 2018-11-16 DEATH — deceased

## 2019-06-25 IMAGING — US IR PICC >5YO
1 series · 2 of 2 positions shown · non-contrast
Comparison: none

INDICATION: Retroperitoneal abscess. Request for PICC line placement for
prolonged IV antibiotic therapy.

[Series 1: ir picc >5yo · 2 of 2 slices shown]
[im 1/2]
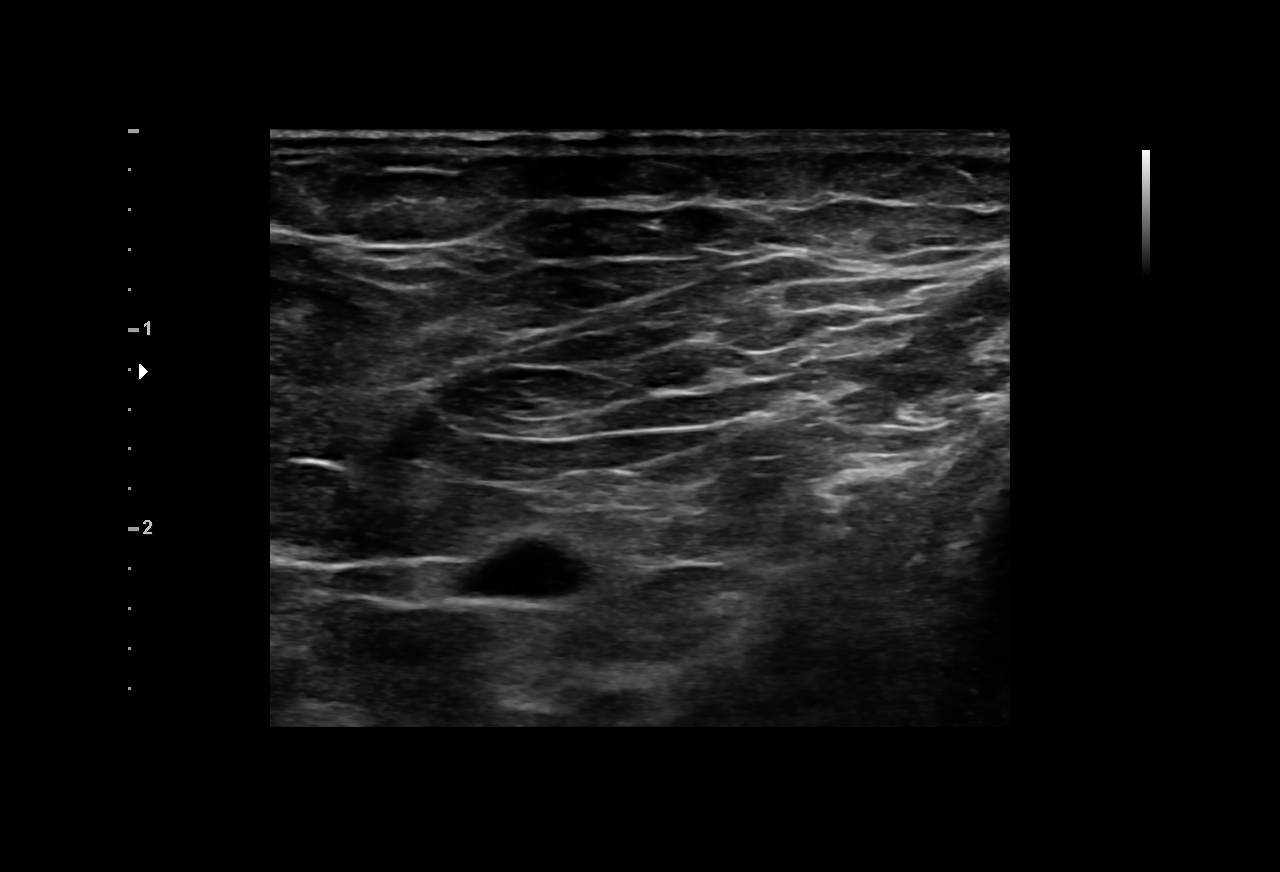
[im 2/2]
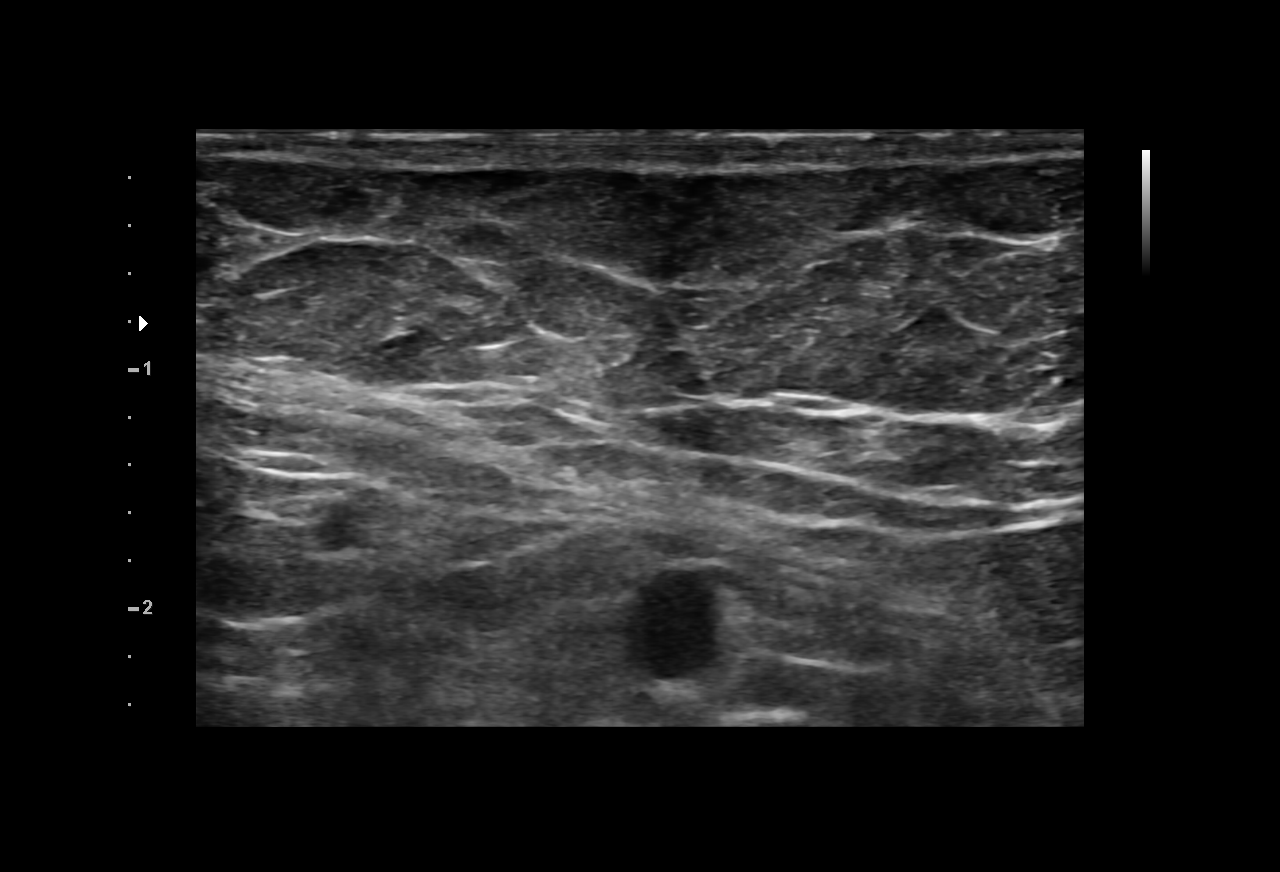

[2 of 2 positions shown; findings below may reference images not displayed]

EXAM:
RIGHT UPPER EXTREMITY PICC LINE PLACEMENT WITH ULTRASOUND AND
FLUOROSCOPIC GUIDANCE

MEDICATIONS:
None;

ANESTHESIA/SEDATION:
Moderate Sedation Time:  None

The patient was continuously monitored during the procedure by the
interventional radiology nurse under my direct supervision.

FLUOROSCOPY TIME:  Fluoroscopy Time: 12 seconds

COMPLICATIONS:
None immediate.

PROCEDURE:
The patient was advised of the possible risks and complications and
agreed to undergo the procedure. The patient was then brought to the
angiographic suite for the procedure.

The right arm was prepped with chlorhexidine, draped in the usual
sterile fashion using maximum barrier technique (cap and mask,
sterile gown, sterile gloves, large sterile sheet, hand hygiene and
cutaneous antiseptic). Local anesthesia was attained by infiltration
with 1% lidocaine.

Ultrasound demonstrated patency of the basilic vein, and this was
documented with an image. Under real-time ultrasound guidance, this
vein was accessed with a 21 gauge micropuncture needle and image
documentation was performed. The needle was exchanged over a
guidewire for a peel-away sheath through which a 40 cm 5 French
single lumen power injectable PICC was advanced, and positioned with
its tip at the lower SVC/right atrial junction. Fluoroscopy during
the procedure and fluoro spot radiograph confirms appropriate
catheter position. The catheter was flushed, secured to the skin,
and covered with a sterile dressing.
IMPRESSION: Successful placement of a right arm PICC with sonographic and
fluoroscopic guidance. The catheter is ready for use.

## 2019-07-10 IMAGING — XA IR IMAGE GUIDED DRAINAGE PERCUT CATH  PERITONEAL RETROPERIT
3 series · 3 of 3 positions shown · non-contrast
Comparison: none

INDICATION: 72-year-old with history of left nephrectomy and persistent left
flank and retroperitoneum fluid collections despite having drains
within the left retroperitoneum and the left back subcutaneous
tissues. Patient presents for additional drainage of the left flank
and retroperitoneal fluid collections.

[Series 1: fl (-) angio · 1 of 1 slices shown (1 of 3)]
[im 1/1]
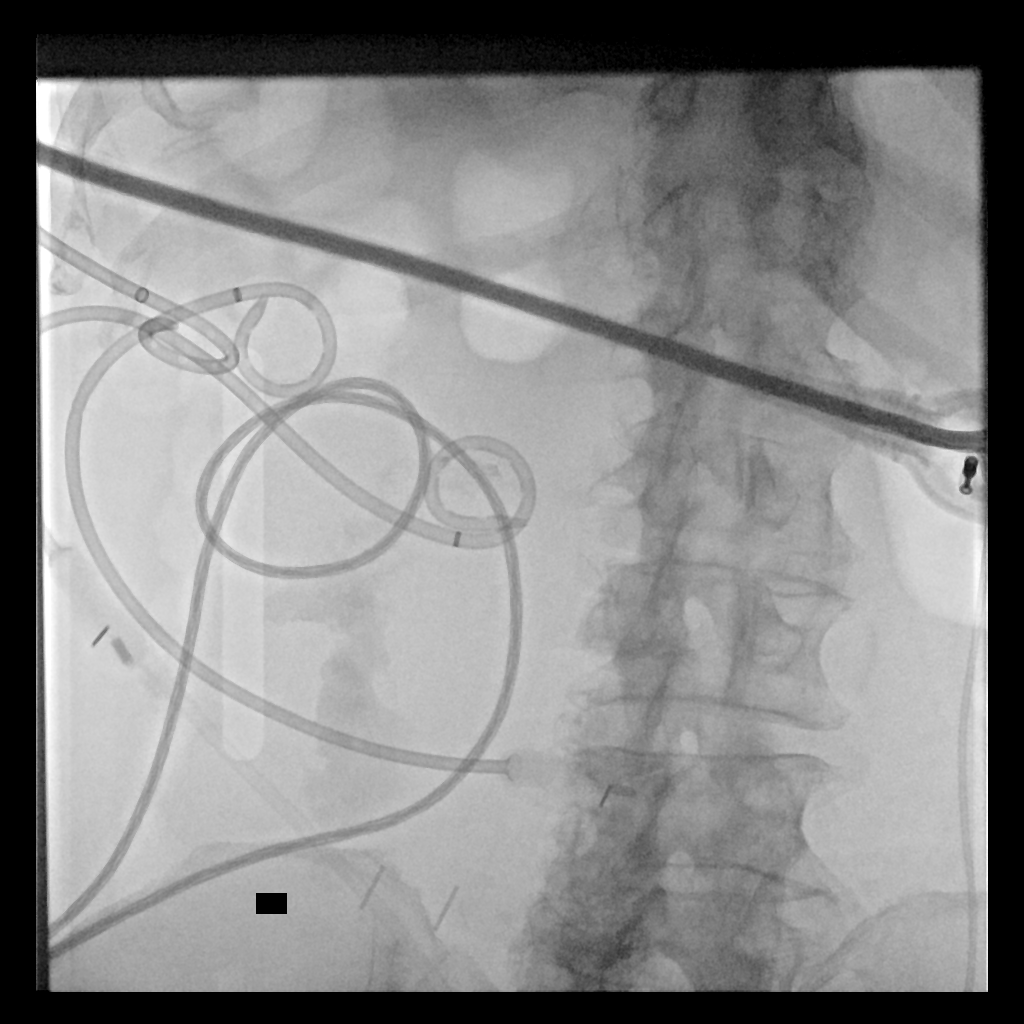

[Series 2: fl (-) angio · 1 of 1 slices shown (2 of 3)]
[im 1/1]
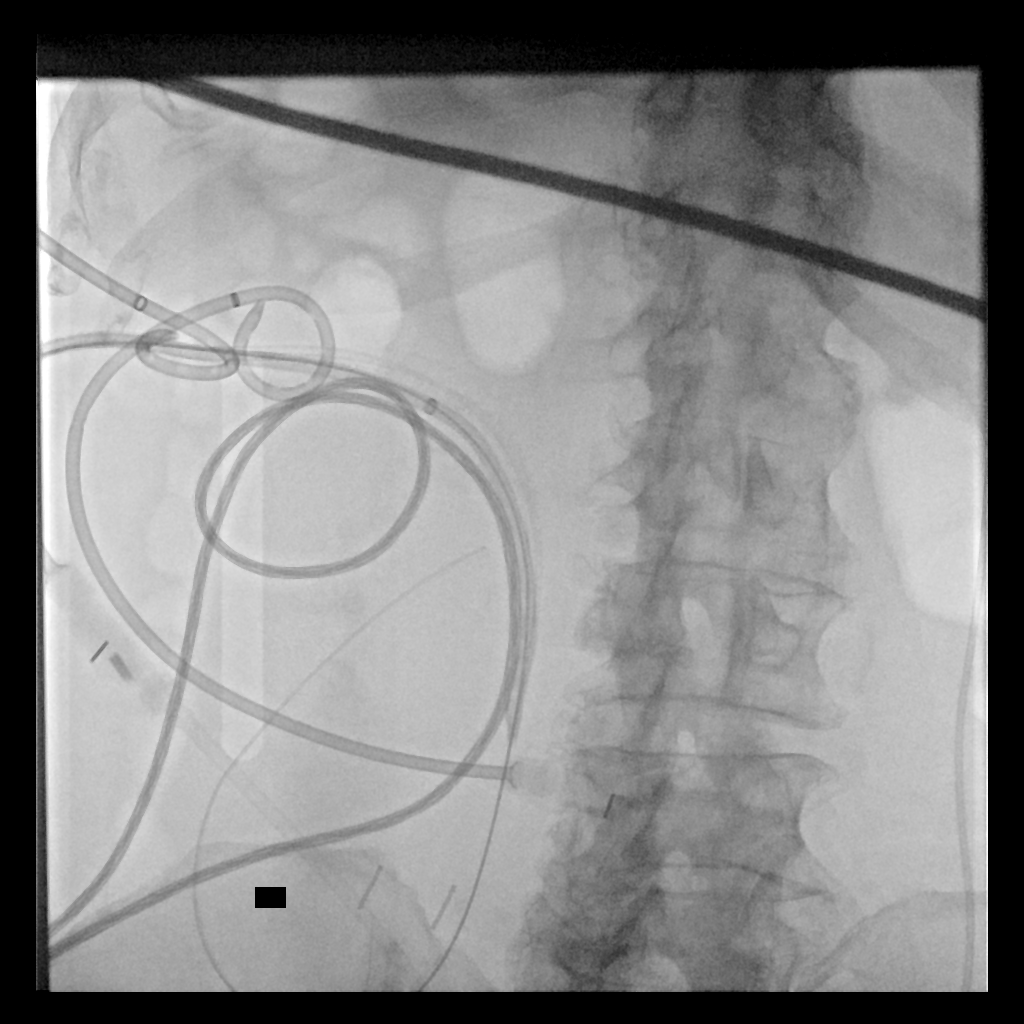

[Series 5: fl (-) angio · 1 of 1 slices shown (3 of 3)]
[im 1/1]
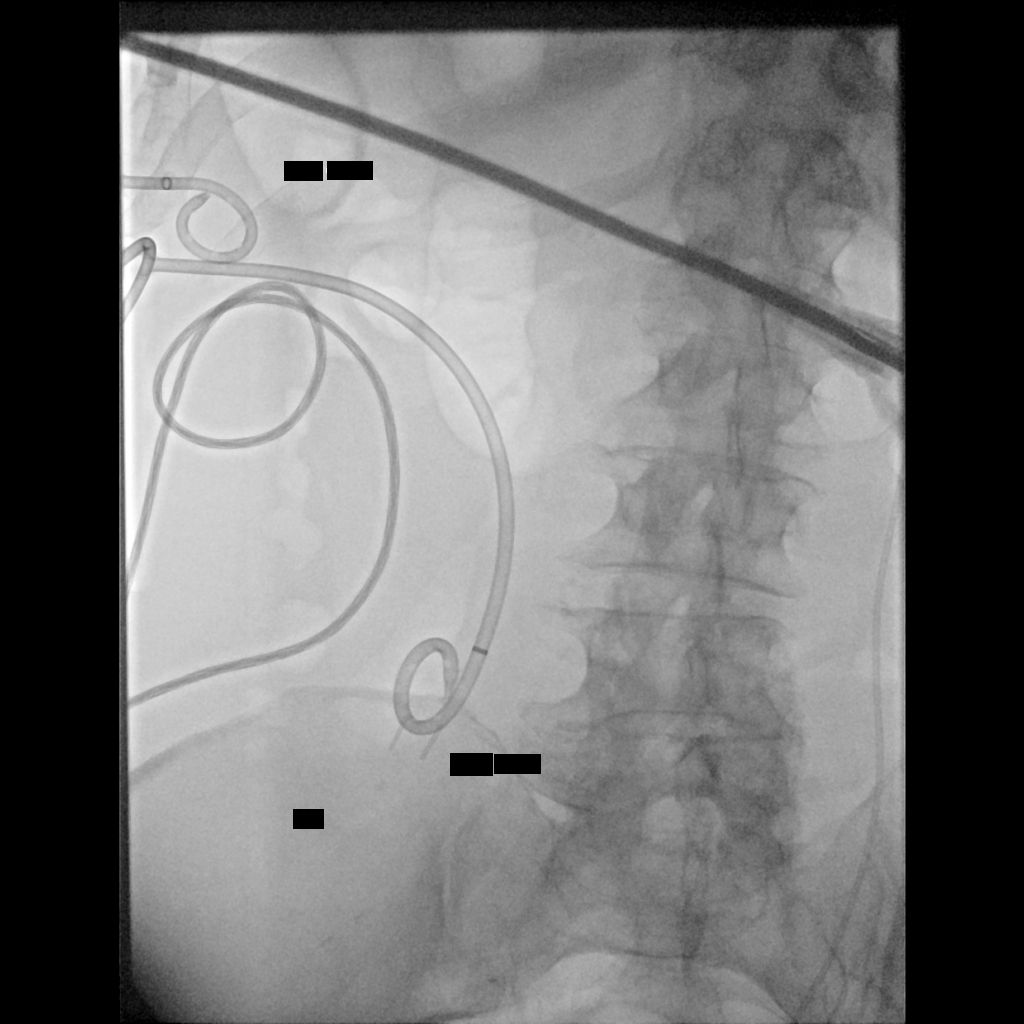

[3 of 3 positions shown; findings below may reference images not displayed]

EXAM:
DRAIN INJECTIONS X 2

EXCHANGE AND REPOSITIONING OF ONE EXISTING DRAIN

PLACEMENT OF NEW SUBCUTANEOUS DRAIN WITH ULTRASOUND GUIDANCE

MEDICATIONS:
The patient is currently admitted to the hospital and receiving
intravenous antibiotics. The antibiotics were administered within an
appropriate time frame prior to the initiation of the procedure.

ANESTHESIA/SEDATION:
Fentanyl 150 mcg IV; Versed 4 mg IV

Moderate Sedation Time:  47 minutes

The patient was continuously monitored during the procedure by the
interventional radiology nurse under my direct supervision.

CONTRAST:  15 mL Ssovue-VBB

COMPLICATIONS:
None immediate.

FLUOROSCOPY TIME:  2 minutes, 6 seconds; 4 mGy

PROCEDURE:
Informed written consent was obtained from the patient after a
thorough discussion of the procedural risks, benefits and
alternatives. All questions were addressed. A timeout was performed
prior to the initiation of the procedure.

Patient was placed prone. Left flank was evaluated with ultrasound.
The existing drains and left flank were prepped and draped in
sterile fashion. Maximal barrier sterile technique was utilized
including caps, mask, sterile gowns, sterile gloves, sterile drape,
hand hygiene and skin antiseptic. Skin was anesthetized with 1%
lidocaine. Using ultrasound guidance, a Yueh catheter was directed
into the large subcutaneous fluid collection and thick yellow fluid
was aspirated. Stiff Amplatz wire was placed and the tract was
dilated to accommodate a 12 French drain. Approximately 250 mL of
thick yellow fluid was removed from this subcutaneous space.
Aspiration of the fluid required extensive irrigation and catheter
manipulation.

Attention was directed to the existing drains. The drain in the left
retroperitoneum was injected with contrast. The drain was not
aspirating well. This drain was cut and removed over an Amplatz wire
and a new 14 French drain was advanced into the retroperitoneal
fluid collection. A large amount of thick yellow fluid was removed
from the retroperitoneal collection. The collection required
extensive irrigation and manipulation with the tube in order to
decompress the collection.

The old superficial drain was not aspirating and it was difficult to
flush during the drain injection. Contrast appeared to be refluxing
around the catheter. This catheter was completely removed. After
removal of this catheter, purulent fluid was leaking out of the old
drain site. Additional fluid was removed from the subcutaneous
tissues through the newly placed drain. After extensive irrigation
and aspiration from the drains, greater than 900 mL of thick yellow
fluid was removed. The 2 drains were sutured to skin and attached to
suction bulb.
FINDINGS: Massive amount of thick yellow fluid in the left flank subcutaneous
tissues and left retroperitoneum. The subcutaneous and
retroperitoneal collections were successfully decompressed with
placement of a new drain in the subcutaneous tissues and up sizing
of the left retroperitoneal drain. Decompression of these
collections required extensive irrigation and drain manipulation.
The superficial and retroperitoneal collections both appeared to be
decompressed at the end of the procedure based on ultrasound.
IMPRESSION: Successful decompression of the left flank subcutaneous fluid
collection and the left retroperitoneal fluid collection by placing
a new subcutaneous drain and up sizing / repositioning the left
retroperitoneal drain. The old subcutaneous drain was completely
removed. Greater than 900 mL of thick yellow fluid was removed from
the collections. Fluid was sent for culture and triglyceride to
exclude a chylous leak.
